# Patient Record
Sex: Male | Born: 1935 | Race: White | Hispanic: No | Marital: Single | Smoking: Former smoker
Health system: Southern US, Community
[De-identification: ages and names within clinical notes are randomized; demographics above are authoritative.]

## PROBLEM LIST (undated history)

## (undated) DIAGNOSIS — K7031 Alcoholic cirrhosis of liver with ascites: Secondary | ICD-10-CM

## (undated) DIAGNOSIS — D62 Acute posthemorrhagic anemia: Secondary | ICD-10-CM

## (undated) DIAGNOSIS — E785 Hyperlipidemia, unspecified: Secondary | ICD-10-CM

## (undated) DIAGNOSIS — I4891 Unspecified atrial fibrillation: Secondary | ICD-10-CM

## (undated) DIAGNOSIS — I8510 Secondary esophageal varices without bleeding: Secondary | ICD-10-CM

## (undated) DIAGNOSIS — C61 Malignant neoplasm of prostate: Secondary | ICD-10-CM

## (undated) DIAGNOSIS — E039 Hypothyroidism, unspecified: Secondary | ICD-10-CM

## (undated) DIAGNOSIS — J189 Pneumonia, unspecified organism: Secondary | ICD-10-CM

## (undated) DIAGNOSIS — F32A Depression, unspecified: Secondary | ICD-10-CM

## (undated) DIAGNOSIS — R350 Frequency of micturition: Secondary | ICD-10-CM

## (undated) DIAGNOSIS — K7030 Alcoholic cirrhosis of liver without ascites: Secondary | ICD-10-CM

## (undated) DIAGNOSIS — I509 Heart failure, unspecified: Secondary | ICD-10-CM

## (undated) DIAGNOSIS — S72142A Displaced intertrochanteric fracture of left femur, initial encounter for closed fracture: Secondary | ICD-10-CM

## (undated) DIAGNOSIS — F419 Anxiety disorder, unspecified: Secondary | ICD-10-CM

## (undated) DIAGNOSIS — K5909 Other constipation: Secondary | ICD-10-CM

## (undated) DIAGNOSIS — N401 Enlarged prostate with lower urinary tract symptoms: Secondary | ICD-10-CM

## (undated) DIAGNOSIS — N138 Other obstructive and reflux uropathy: Secondary | ICD-10-CM

## (undated) DIAGNOSIS — I209 Angina pectoris, unspecified: Secondary | ICD-10-CM

## (undated) DIAGNOSIS — F329 Major depressive disorder, single episode, unspecified: Secondary | ICD-10-CM

## (undated) DIAGNOSIS — I10 Essential (primary) hypertension: Secondary | ICD-10-CM

## (undated) DIAGNOSIS — K219 Gastro-esophageal reflux disease without esophagitis: Secondary | ICD-10-CM

## (undated) DIAGNOSIS — Z87898 Personal history of other specified conditions: Secondary | ICD-10-CM

## (undated) DIAGNOSIS — M1990 Unspecified osteoarthritis, unspecified site: Secondary | ICD-10-CM

## (undated) DIAGNOSIS — H9193 Unspecified hearing loss, bilateral: Secondary | ICD-10-CM

## (undated) DIAGNOSIS — M4854XA Collapsed vertebra, not elsewhere classified, thoracic region, initial encounter for fracture: Secondary | ICD-10-CM

## (undated) HISTORY — PX: JOINT REPLACEMENT: SHX530

## (undated) HISTORY — DX: Other obstructive and reflux uropathy: N13.8

## (undated) HISTORY — DX: Essential (primary) hypertension: I10

## (undated) HISTORY — DX: Unspecified osteoarthritis, unspecified site: M19.90

## (undated) HISTORY — DX: Collapsed vertebra, not elsewhere classified, thoracic region, initial encounter for fracture: M48.54XA

## (undated) HISTORY — DX: Acute posthemorrhagic anemia: D62

## (undated) HISTORY — DX: Hyperlipidemia, unspecified: E78.5

## (undated) HISTORY — DX: Other obstructive and reflux uropathy: N40.1

## (undated) HISTORY — DX: Alcoholic cirrhosis of liver without ascites: K70.30

## (undated) HISTORY — DX: Pneumonia, unspecified organism: J18.9

## (undated) HISTORY — DX: Secondary esophageal varices without bleeding: I85.10

## (undated) HISTORY — DX: Alcoholic cirrhosis of liver with ascites: K70.31

---

## 1988-04-24 HISTORY — PX: INGUINAL HERNIA REPAIR: SUR1180

## 1990-04-24 DIAGNOSIS — C61 Malignant neoplasm of prostate: Secondary | ICD-10-CM

## 1990-04-24 HISTORY — DX: Malignant neoplasm of prostate: C61

## 1990-04-24 HISTORY — PX: TRANSURETHRAL RESECTION OF PROSTATE: SHX73

## 1992-04-24 HISTORY — PX: TOTAL KNEE ARTHROPLASTY: SHX125

## 1995-04-25 HISTORY — PX: TOTAL SHOULDER REPLACEMENT: SUR1217

## 1997-10-06 ENCOUNTER — Ambulatory Visit (HOSPITAL_COMMUNITY): Admission: RE | Admit: 1997-10-06 | Discharge: 1997-10-06 | Payer: Self-pay | Admitting: Orthopedic Surgery

## 1997-10-20 ENCOUNTER — Encounter: Admission: RE | Admit: 1997-10-20 | Discharge: 1998-01-18 | Payer: Self-pay | Admitting: Anesthesiology

## 2001-07-29 ENCOUNTER — Encounter: Payer: Self-pay | Admitting: Orthopedic Surgery

## 2001-08-02 ENCOUNTER — Inpatient Hospital Stay (HOSPITAL_COMMUNITY): Admission: RE | Admit: 2001-08-02 | Discharge: 2001-08-05 | Payer: Self-pay | Admitting: Orthopedic Surgery

## 2001-08-02 ENCOUNTER — Encounter: Payer: Self-pay | Admitting: Orthopedic Surgery

## 2002-04-24 HISTORY — PX: CHOLECYSTECTOMY: SHX55

## 2002-10-06 ENCOUNTER — Inpatient Hospital Stay (HOSPITAL_COMMUNITY): Admission: EM | Admit: 2002-10-06 | Discharge: 2002-10-10 | Payer: Self-pay | Admitting: Emergency Medicine

## 2002-10-06 ENCOUNTER — Encounter: Payer: Self-pay | Admitting: Cardiology

## 2003-01-26 ENCOUNTER — Encounter: Payer: Self-pay | Admitting: Cardiology

## 2003-01-26 ENCOUNTER — Ambulatory Visit (HOSPITAL_COMMUNITY): Admission: RE | Admit: 2003-01-26 | Discharge: 2003-01-26 | Payer: Self-pay | Admitting: Cardiology

## 2003-04-21 ENCOUNTER — Inpatient Hospital Stay (HOSPITAL_COMMUNITY): Admission: EM | Admit: 2003-04-21 | Discharge: 2003-04-30 | Payer: Self-pay | Admitting: Emergency Medicine

## 2003-04-27 ENCOUNTER — Encounter (INDEPENDENT_AMBULATORY_CARE_PROVIDER_SITE_OTHER): Payer: Self-pay | Admitting: Specialist

## 2004-07-11 ENCOUNTER — Encounter: Admission: RE | Admit: 2004-07-11 | Discharge: 2004-07-11 | Payer: Self-pay | Admitting: Orthopedic Surgery

## 2004-09-08 ENCOUNTER — Encounter: Admission: RE | Admit: 2004-09-08 | Discharge: 2004-09-08 | Payer: Self-pay | Admitting: Cardiology

## 2004-12-27 ENCOUNTER — Encounter: Admission: RE | Admit: 2004-12-27 | Discharge: 2004-12-27 | Payer: Self-pay | Admitting: General Surgery

## 2005-11-16 ENCOUNTER — Encounter: Admission: RE | Admit: 2005-11-16 | Discharge: 2005-11-16 | Payer: Self-pay | Admitting: Cardiology

## 2005-11-22 ENCOUNTER — Encounter: Payer: Self-pay | Admitting: Vascular Surgery

## 2005-11-22 ENCOUNTER — Ambulatory Visit (HOSPITAL_COMMUNITY): Admission: RE | Admit: 2005-11-22 | Discharge: 2005-11-22 | Payer: Self-pay | Admitting: Family Medicine

## 2007-03-05 ENCOUNTER — Encounter: Admission: RE | Admit: 2007-03-05 | Discharge: 2007-03-05 | Payer: Self-pay | Admitting: Family Medicine

## 2007-11-28 ENCOUNTER — Encounter: Admission: RE | Admit: 2007-11-28 | Discharge: 2007-11-28 | Payer: Self-pay | Admitting: Cardiology

## 2008-12-16 ENCOUNTER — Encounter: Admission: RE | Admit: 2008-12-16 | Discharge: 2008-12-16 | Payer: Self-pay | Admitting: Cardiology

## 2009-10-15 ENCOUNTER — Encounter: Admission: RE | Admit: 2009-10-15 | Discharge: 2009-10-15 | Payer: Self-pay | Admitting: Family Medicine

## 2010-03-15 ENCOUNTER — Encounter: Admission: RE | Admit: 2010-03-15 | Discharge: 2010-03-15 | Payer: Self-pay | Admitting: Cardiology

## 2010-04-09 ENCOUNTER — Ambulatory Visit (HOSPITAL_COMMUNITY)
Admission: RE | Admit: 2010-04-09 | Discharge: 2010-04-09 | Payer: Self-pay | Source: Home / Self Care | Attending: Orthopedic Surgery | Admitting: Orthopedic Surgery

## 2010-09-09 NOTE — Consult Note (Signed)
NAME:  Raymond Larsen, PUCCINELLI NO.:  [id number]   MEDICAL RECORD NO.:  [id number]                   PATIENT TYPE:  INP   LOCATION:  2015                                 FACILITY:  [facility]   PHYSICIAN:  Georgiana Spinner, M.D.                 DATE OF BIRTH:  [birth year]   DATE OF CONSULTATION:  04/22/2003  DATE OF DISCHARGE:                                   CONSULTATION   HISTORY OF PRESENT ILLNESS:  Mr. Hippe is a 77 year old gentleman who I have  been asked to see because of weight loss and dysphagia and anorexia.  The  patient states that over the last three months he has lost approximately 38  pounds.  He had a history of GERD, on Nexium therapy.  He has been followed  at the [place].  He was seen there, and apparently gave a history of dysphagia  mostly to solid foods, but some for liquids.  He describes the fact that he  will chew and eat and it seems to stop, and then with time the food will  eventually go down.  He was seen at the [facility] in [place], [place], and  apparently a barium swallow was done.  I called the number there, however,  the number given was in error.  At any rate, his past medical history is  notable for atrial fibrillation, and he was cardioverted in October 2004.  He had a history of congestive heart failure.  Catheterization in June 2004,  showed an ejection fraction of 40 to 45%, some coronary calcifications, and  some coronary artery disease, but too small for intervention mechanically.  Therefore, medical intervention was recommended.  He had again a mildly  dilated left ventricle on ultrasound.  He has had a history of hypertension  and pulmonary hypertension.  He also has a history of osteoarthritis and  gout.  Hypothyroidism, anxiety, depression, hyperlipidemia, and a history of  prostatitis with BPH.   PAST SURGICAL HISTORY:  As noted in the chart.  He has had no abdominal  surgeries, though inguinal hernia repair many, many years ago.   ALLERGIES:  SULFA.   MEDICATIONS:  He is taking Nexium apparently for reflux.   FAMILY HISTORY:  Noncontributory.   SOCIAL HISTORY:  He has had a heavy use of alcohol in the past, but stopped  about in 1987.  He chews tobacco, but does not smoke.   ADMISSION MEDICATIONS:  1. Niacin.  2. Coumadin.  3. Digoxin.  4. Lasix.  5. Remeron.  6. Ultram.  7. Lisinopril.  8. ________.  9. Nexium.  10.      Simvastatin.  11.      Trazodone.  12.      Gemfibrozil.  13.      Allopurinol.  14.      Levofloxacin.  15.      Hytrin.  16.      Amiodarone.   PHYSICAL EXAMINATION:  VITAL SIGNS:  On admission, his temperature was 97,  pulse 92, respiratory rate 18, blood pressure approximately 100 systolic.  His O2 saturation was 98%.  GENERAL:  He is a thin male, appears in no distress.  He is alert and  oriented and cooperative.  HEENT:  Without jaundice.  NECK:  There were no bruits in the neck.  LUNGS:  Clear.  HEART:  Regular rhythm at this time when I checked him with a short systolic  murmur heard over the left systolic border.  ABDOMEN:  Benign, nontender.  RECTAL:  Deferred.  The remainder of the examination was grossly normal.   LABORATORY DATA:  CMET shows a BUN of 34, creatinine of 3.1, indicating some  degree of mild renal insufficiency.  The albumin is 4.3, and I wonder  whether he may be slightly dehydrated.  His CBC shows hematocrit of 37.2,  white count of 8400, platelet count 173,000.  TSH was normal.  Vitamin B12  and folate were normal.  Amylase and lipase were in the normal range.  Prothrombin time 16.9 seconds, PTT was 41 seconds.  Radiologic studies show  no active chest disease.  No evidence of bowel obstruction.   IMPRESSION AND RECOMMENDATIONS:  Given the patient's significant weight loss  with dysphagia and reflux, certainly an adenocarcinoma of the esophagus  should be a consideration.  Also given the fact that he has had heavy  alcohol use in the past and  has been a tobacco user, certainly a squamous  cell carcinoma of the esophagus or even a higher lesion such as pharyngeal  tumor must be considered.  We at this point suggest that endoscopy.  We  explained the procedure, the risks, benefits, rationales to the patient and  he appears to be alert and cooperative, and wishes to proceed.                                               Georgiana Spinner, M.D.    GMO/MEDQ  D:  04/22/2003  T:  04/22/2003  Job:  [id number]   cc:   Gloriajean Dell. Andrey Campanile, M.D.  [address]  [place]  [place] [address]  Fax: [phone]

## 2010-09-09 NOTE — H&P (Signed)
NAME:  Raymond Larsen, WINT NO.:  [id number]   MEDICAL RECORD NO.:  [id number]                   PATIENT TYPE:  INP   LOCATION:  1825                                 FACILITY:  [facility]   PHYSICIAN:  Rosanne Sack, M.D.         DATE OF BIRTH:  23-Jul-1922   DATE OF ADMISSION:  DATE OF DISCHARGE:                                HISTORY & PHYSICAL   CHIEF COMPLAINT:  Ongoing nausea, anorexia, weight loss.   PROBLEM LIST:  1. Signs and symptoms complex including nausea, anorexia, weight loss.  The     patient does state that he has lost approximately 38 pounds over the last     3 months.     A. Vomited 1 time over the past 24 hours.     B. History listed of GERD on routine Nexium.  2. Atrial fibrillation, unclear duration, hospitalized Raymond Larsen June 14-     June 18,2004.     A. Cardioversion successful January 26, 2003.     B. Listed previous history of congestive heart failure.     C. Cardiac catheterization October 08, 2002.  Noted left ventricular        systolic function mildly decreased with ejection fraction 40-45%.        Some coronary calcifications.  Circumflex intermediate branch 60%        stenosis, 90% mid-stenosis, too small for intervention. Recommended        medical management.     D. A 2-D echocardiogram October 07, 2002 noted mild dilated left ventricle        with left ventricular systolic function mildly to moderately reduced.        Ejection fraction 40-45%.  Left atrium was mildly dilated.     E. Hypertensive heart disease.     F. Pulmonary hypertension.  3. Hypertension, as above.  4. Severe osteoarthritis and listed history of gout.     A. Status post right shoulder arthroplasty August 02, 2001 with Dr.        Simonne Come.  5. Hypothyroidism.  6. Anxiety and depression.  7. Hyperlipidemia.  8. History of acute prostatitis.  Listed history of BPH status post TURP     1970s.   PAST SURGICAL HISTORY:  In 1989, right rotator  arthroplasty.  In 1994, right  total knee replacement.  TURP 1970's.  Left inguinal hernia repair x2  1980's. ORIF right shoulder Dr. Simonne Come April 2003.   ALLERGIES:  SULFA.   CODE STATUS:  Clearly a full code.   HISTORY OF PRESENT ILLNESS:  This 77 year old male is followed as an  outpatient by Dr. Andrey Campanile at [facility].  He describes a 30  days' history of nausea, anorexia, weight loss.  In fact, the weight loss  extends back approximately 3 months and the patient has lost approximately  38 pounds.  Describes what sounds like esophageal stricture, describing  solids becoming  stuck.  The patient has been subsisting on liquids such as  Ensure.  He did vomit x1 today. He was seen recently at the [facility]  Administration. They apparently did a barium swallow and lab work at that  time, though I do not have results.  He was seen at [facility] today and vomited while at the office, was sent onto [facility]  Emergency Room for further evaluation and treatment.   FAMILY HISTORY:  Noncontributory in this 77 year old male.   SOCIAL HISTORY:  Reported heavy alcohol use though quit 1987.  Nonsmoker,  though does chew tobacco.  He is retired.   REVIEW OF SYSTEMS:  GENERAL:  Denies fever but significant weight loss as  described above.  HEENT:  Denies any changes in his visual acuity or hearing  acuity. Does describe some type of dysphagia with the sensation of solids  becoming stuck in the esophagus.  Has been subsisting on liquids such as  Ensure.  HEART:  Denies chest pain or palpitations. Does have a significant  history of atrial fibrillation and coronary artery disease as described  above.  LUNGS:  Denies any shortness of breath or cough. Does describe some  night sweats, however.  ABDOMEN:  Vomiting x1 today.  Ongoing nausea for at  least 30 days. Denies any obvious blood or tarry stools, denies any coffee-  grounds emesis.  MUSCULOSKELETAL:   Describes progressive weakness.  Various  mild arthralgias.  Does have a significant arthritic history and gout  listed.  NEUROLOGIC:  Denies any syncope or vertigo, denies history of  stroke or seizure.  INTEGUMENTARY:  Denies any open ulcers or suspicious  lesions.   MEDICATIONS ON ADMISSION:  1. Niacin 1000 mg daily.  2. Warfarin (Coumadin) 5 mg take 1/2 tablet equally 2.5 mg daily at bedtime.  3. Digoxin/Lanoxin 0.25 mg daily.  4. Lasix 40 mg  daily.  5. Remeron 15 mg, take 1/2 tablet equaling 7.5 mg daily at bedtime.  6. Ultram 5/500 mg 4 times daily as needed.  7. Lisinopril 20 mg take 1-1/2 tablet equaling 30 mg daily.  8. Inspra 25 mg daily.  9. Nexium 40 mg daily.  10.      Simvastatin 80 mg take 1/2 tablet equaling 40 mg at bedtime.  11.      Trazodone 50/500 mg daily at bedtime.  12.      Gemfibrozil 600 mg daily at bedtime.  13.      Allopurinol 300 mg daily.  14.      Levothyroxine 0.15 mg daily.  15.      Hytrin 10 mg daily at bedtime.  16.      Amiodarone 200 mg take 1/2 tablet equaling 100 mg daily.   LABORATORY DATA:  Admission labs are all pending, as per admission orders.  Radiology the same.   PHYSICAL EXAMINATION:  VITAL SIGNS:  Temperature 97.0, pulse 92,  respirations 18, blood pressure ranging 80-101/54, O2 saturation 98%.  GENERAL:  A well-developed though somewhat thin elderly male in no acute  distress. Alert and oriented, cooperative with exam.  HEENT:  Normocephalic head.  Eyes:  Pupils equal and reactive. Ears:  TMs  clear, hearing grossly normal.  Nose:  Patent without masses or discharge  noted. Oral mucosa pink and moist.  NECK:  No jugulovenous distention or bruits noted.  No lymphadenopathy.  LUNGS:  Breath sounds clear and equal without distress or cough.  CHEST:  No adenopathy noted.  CARDIOVASCULAR:  Regular  rate and rhythm with a 1/6 systolic ejection murmur  heard best over the left sternal border. ABDOMEN:  Soft and round, positive bowel  sounds.  Mildly and diffusely  tender with palpation.  RECTAL:  Deferred.  UROGENITAL:  No bladder pain or CVA tenderness.  Genitalia exam deferred as  noncontributory.  MUSCULOSKELETAL: Range of motion is full.  Strength mildly decreased but  equal.  NEUROLOGIC:  Cranial nerves II-XII grossly intact. No obvious unilateral or  focal defects.  INTEGUMENTARY:  Warm and dry.  Some skin tenting suggestive of a degree of  dehydration.  No obvious ulcers or suspicious lesions.   IMPRESSION/PLAN:  1. Signs and symptoms complex including nausea, anorexia, weight loss,     vomiting x1 today.  He has had a work-up recently at the [facility]     Administration clinic including blood work and some type of barium     swallow, though we do not have these results currently.  Abdominal exam     essentially nonfocal except for some slight tenderness. No indication of     hematemesis or tarry, or frank bloody stool.  Would make the patient     n.p.o. on admission except for sips of water and any oral medications.  Will have to repeat all lab work including comprehensive metabolic panel,  amylase, lipase.  Will proceed with plain film radiology at this point  including chest x-ray and abdominal x-rays, and defer further radiology  recommendations to GI consult which will be requested. Will hold all  possible offending medicines such as Niacin, statin, gemfibrozil,  allopurinol and possibly amiodarone.  Would continue proton pump inhibitor.  Would check a urinalysis with culture and sensitivity, though the patient  does not appear positive for urinary tract infection per clinical exam.  1. Uncontrolled hypertension with mild hypotension currently. Suspect a     degree of dehydration. Would hold lisinopril at this point until we are     able to follow his renal function and electrolytes.  Would also hold     Lasix and empirically hydrate following 4 comprehensive metabolic panel     values.  2.  Dehydration and acute renal insufficiency clinically.  Admission labs are     pending. Would hydrate, dependent on lab values and fluid rate, depending     on lab values. Again, would hold ACE inhibitor and Lasix.  3. Hypothyroidism. Patient is on replacement. Would check a TSH.  4. Atrial fibrillation and history of coronary artery disease.  Previously     determined medical management.  No complaints of chest pain but would     follow a 12-lead electrocardiogram and 1 set of cardiac enzymes.     Telemetry bed to follow initially.  5. Coumadin anticoagulation, secondary to his history of atrial     fibrillation.  Would continue Coumadin with pharmacy to follow dosing.     Will need to check a PT/INR.   DISPOSITION:  Telemetry bed service, Rosanne Sack, M.D., [phone].   DIET:  N.P.O. except sips of H20 and medications.   ACTIVITY:  Up to chair and bathroom as tolerated.   CONDITION:  Guarded.     Everett Graff, N.P.                     Rosanne Sack, M.D.    TC/MEDQ  D:  04/21/2003  T:  04/21/2003  Job:  [id number]

## 2010-09-09 NOTE — Op Note (Signed)
NAME:  Raymond Larsen, Raymond Larsen NO.:  [id number]   MEDICAL RECORD NO.:  [id number]                   PATIENT TYPE:  INP   LOCATION:  2015                                 FACILITY:  [facility]   PHYSICIAN:  Georgiana Spinner, M.D.                 DATE OF BIRTH:  Nov 08, 1922   DATE OF PROCEDURE:  04/22/2003  DATE OF DISCHARGE:                                 OPERATIVE REPORT   PROCEDURE:  Endoscopy.   ANESTHESIA:  Demerol 40 mg and Versed 5 mg.   ENDOSCOPIST:  Georgiana Spinner, M.D.   INDICATIONS FOR PROCEDURE:  Significant dysphagia.   DESCRIPTION OF PROCEDURE:  With the patient mildly sedated in the left  lateral decubitus position, the Olympus videoscopic endoscope was inserted  in the mouth and passed under direction vision through the esophagus which  appeared grossly normal.  We entered into the stomach and the fundus, body,  antrum, duodenal bulb and second portion of the duodenal all appeared  normal.  From this point the endoscope was slowly withdrawn taking several  different views of the duodenal mucosa.  The endoscope was then pulled back  into the stomach and placed in retroflexion to view the stomach from below  and a loose wrap of the GE junction around the endoscope was noted.  The  endoscope was then straightened and withdrawn taking several different views  of the remaining gastric and esophageal mucosa.  The patient's vital signs  and pulse oximeter remained stable.  The patient tolerated the procedure  well without apparent complications.   FINDINGS:  Unremarkable endoscopic examination.   PLAN:  We will get a modified barium swallow to evaluate the upper  esophageal sphincter dysfunction and possibly upper esophageal dysphagia  along with a CT scan of the abdomen and pelvis for continued prolonged  weight loss.                                               Georgiana Spinner, M.D.    GMO/MEDQ  D:  04/22/2003  T:  04/22/2003  Job:  [id number]   cc:    Gloriajean Dell. Andrey Campanile, M.D.  [address]  [place]  [place] [address]  Fax: [phone]

## 2010-09-09 NOTE — Discharge Summary (Signed)
NAME:  Raymond Larsen, Raymond Larsen NO.:  [id number]   MEDICAL RECORD NO.:  [id number]                   PATIENT TYPE:  INP   LOCATION:  2015                                 FACILITY:  [facility]   PHYSICIAN:  Lonia Blood, M.D.            DATE OF BIRTH:  Oct 14, 1922   DATE OF ADMISSION:  04/21/2003  DATE OF DISCHARGE:  04/30/2003                                 DISCHARGE SUMMARY   DISCHARGE DIAGNOSES:  1. Subacute/chronic cholecystitis.     a. Status post laparoscopic cholecystectomy.     b. Resolution of symptoms to include nausea, anorexia and weight loss.  2. Long history of atrial fibrillation.     a. Successful direct current cardioversion, January 26, 2003.     b. Followed by Georga Hacking, M.D.     c. Chronic Coumadin therapy.     d. Chronic amiodarone therapy.  3. Mild systolic congestive heart failure with ejection fraction 40-45% on     cardiac catheterization, June 2004.     a. Circumflex intermediate branch, 60% stenosis.     b. 90% midstenosis, circumflex intermediate branch.  4. Hypertension.  5. Mild pulmonary hypertension.  6. Severe osteoarthritis and possible history of gout-currently well-     controlled off allopurinol.  7. Status post right shoulder arthroplasty, April 2003-Cliff Aplington, M.D.  8. Hypothyroidism-Synthroid increased this hospitalization.  9. History of anxiety and depression.  10.      Hyperlipidemia.  11.      Remote history of acute prostatitis.  12.      Benign prostatic hypertrophy, status post transurethral resection     of prostate in 1970.  13.      Left inguinal hernia repair x2 in the 80's.  14.      Orthopedic surgery to the right rotator cuff, right total knee and     right shoulder.   ALLERGIES:  SULFA.   DISCHARGE MEDICATIONS:  An attempt has been made to simply the patient's  medical regimen.  1. Hytrin 10 mg q.h.s.  2. Remeron 15 mg-1/2 tablet q.h.s.  3. Nexium 40 mg daily.  4. Synthroid 175 mcg  daily.  5. Lopressor 50 mg b.i.d.  6. Coumadin 2.5 mg q.h.s.  7. Amiodarone 100 mg daily.  8. Niacin 1000 mg daily.  9. Zocor 40 mg each evening.  10.      Gemfibrozil 600 mg each evening.  11.      Percocet p.r.n. pain.   FOLLOWUP:  1. The patient will followup with Abigail Miyamoto, M.D. in two weeks.  2. Patient will present to Dr. York Spaniel office on Monday to have his INR     checked-patient informed of disposition if this is his usual protocol.  3. Patient will followup at [facility] on an as needed     basis for ongoing medical care.   CONSULTATIONS:  1. Georgiana Spinner,  M.D.-Gastroenterology.  2. Abigail Miyamoto, M.D.-General Surgery.   PROCEDURES:  1. Laparoscopic cholecystectomy, April 27, 2003.  2. EGD April 22, 2003-negative examination.  3. Modified barium swallow-unremarkable.  4. Colonoscopy, April 24, 2003-unremarkable.  5. CT scan of the abdomen and pelvis, April 22, 2003-several renal     hypodense lesions on the right which are nonspecific.  No     nephrolithiasis.  Slight nodular contours of the liver.  Thick wall     gallbladder.  Right inguinal nodularity potentially due to small inguinal     lymph nodes or small hydrocele.  6. Ultrasound of the abdomen, April 24, 2003-thickened gallbladder wall     with tenderness over the gallbladder.   HISTORY OF PRESENT ILLNESS:  Raymond Larsen is a very pleasant 77 year old  gentleman, who is followed at [facility] by Gloriajean Dell. Andrey Campanile,  M.D.  He presented to the hospital with a 30 day history of nausea, anorexia  and weight loss.  Weight loss in fact had extended about three months and  the patient reported that over this time he had lost a total of about 38  pounds.  The patient had complaints of solids being stuck in his throat.  He  had been living primarily on liquids.  He was also plagued with significant  nausea and vomiting.  Because the patient's symptoms were severe  and because  the patient's wife was alarmed by his weight loss, he presented to the  emergency room of [facility] for evaluation.   HOSPITAL COURSE:  #1.  Chronic cholecystitis-patient was admitted to the  hospital with complaints of nausea, vomiting and significant weight loss  over a three month period.  The initial concern was for possible esophageal  stricture versus occult cancer, specifically of the gastrointestinal tract.  Georgiana Spinner, M.D. of Gastroenterology was consulted.  Esophagogastroduodenoscopy was accomplished and was unremarkable.  Barium  swallow was accomplished and revealed no esophageal stricture.  Colonoscopy  was obtained and was also unremarkable.  As a result, CT scan of the abdomen  and pelvis was obtained.  This raised the question of possible  cholecystitis.  Ultrasound of the abdomen was obtained and further suggested  cholecystitis.  Abigail Miyamoto, M.D. with General Surgery, was therefore,  consulted.  He agreed that the patient's sign, symptom complex and  radiographic findings/physical examination were all consistent with  cholecystitis.  The patient was taken to the operating room on the above  listed day and underwent a laparoscopic cholecystectomy.  This was carried  off without significant difficulty.  The patient tolerated the procedure  well.  In the postoperative period, he was able to rapidly titrate back to a  regular diet.  Nausea and vomiting had resolved.  There were no more  complaints of abdominal pain and the patient's appetite improved  significantly.  At the time of discharge, the patient's wounds are stable.  They are dressed and dry.  Abdomen is unremarkable and nondistended with  positive bowel sounds.  The patient is tolerating a full regular diet.  The  patient will followup with Dr. Magnus Ivan as detailed above.   #2.  Normocytic anemia-during this hospitalization, the patient noted to have a normocytic anemia.  He has had a  complete workup to include both an  esophagogastroduodenoscopy and colonoscopy, both of which were negative.  This anemia is likely an anemia of chronic disease, secondary to chronic  inflammation with his chronic cholecystitis.  I would simply recommend that  a CBC be obtained in approximately two to three weeks to assure that his  hemoglobin has normalized.   #3.  Hypothyroidism-the patient has a significant history of hypothyroidism.  TSH was obtained during his hospitalization and was markedly elevated at  4.68.  As a result, his Synthroid was increased to 175 mcg p.o. daily.  Repeat of TSH should be accomplished in six to eight weeks.   #4.  Atrial fibrillation.  The patient has a longstanding history of atrial  fibrillation.  He has undergone direct current cardioversion and actually  remained in normal sinus rhythm throughout this entire hospitalization.  He  has previously been treated with amiodarone and also Coumadin for his atrial  fibrillation.  Digoxin has also been used.  Digoxin was discontinued all  together during this hospitalization.  Amiodarone was held initially as it  was thought to be contributing to the patient's gastrointestinal symptom  complex.  After the patient's cholecystectomy, his amiodarone was able to be  resumed.  Coumadin was held in anticipation of his general surgery and the  patient was dosed with Lovenox.  At the time of discharge, the patient's  Coumadin has been resumed.  INR is 1.4 at the time of discharge following a  5 mg loading dose and then resumption of 2.5 mg q.h.s.  It is not felt that  the daily risk of stroke is significant enough to keep the patient in the  hospital awaiting a therapeutic INR, because the patient is in fact in sinus  rhythm at this time and has been throughout this hospitalization.  The  patient will followup with Dr. Donnie Aho as is his routine for a recheck and  monitoring of his INR.   #5.  Polypharmacy-at the time  of admission, the patient complained that he  felt that he was on too many medications.  A concerted effort was made to  simplify the patient's medical regimen during this hospitalization.  It  should be noted that he is being discharged on considerably fewer  medications than he was taking when he was admitted.  It is possible,  however, that some of his previous medications will need to be resumed if  his symptoms justify this.                                                Lonia Blood, M.D.    JTM/MEDQ  D:  04/30/2003  T:  04/30/2003  Job:  [id number]   cc:   Darden Palmer., M.D.  [address], [address]  [place]  [place] [address]  Fax: [phone]   Gloriajean Dell. Andrey Campanile, M.D.  [address]  [place]  [place] [address]  Fax: [phone]   Abigail Miyamoto, M.D.  [address]  [place]  [place] [address]  Fax: [phone]

## 2010-09-09 NOTE — Consult Note (Signed)
NAME:  Raymond Larsen, Raymond Larsen NO.:  [id number]   MEDICAL RECORD NO.:  [id number]                   PATIENT TYPE:  INP   LOCATION:  2015                                 FACILITY:  [facility]   PHYSICIAN:  Abigail Miyamoto, M.D.              DATE OF BIRTH:  1922/09/06   DATE OF CONSULTATION:  04/27/2003  DATE OF DISCHARGE:                                   CONSULTATION   CONSULTING PHYSICIAN:  Abigail Miyamoto, M.D.   REFERRING PHYSICIAN:  Lonia Blood, M.D.   CHIEF COMPLAINT:  Nausea.   HISTORY OF PRESENT ILLNESS:  This is a 77 year old gentleman who was  admitted on April 13, 2003, with a history of ongoing nausea, anorexia,  and weight loss.  The patient reports that he has had weight loss for  approximately three months and had been nauseated with just about everything  he eats.  He also describes some dysphagia.  He does report that he has had  some pain in his right upper quadrant and pain due to his back.  He has also  had occasional emesis.  He denies any jaundice.  He has been able to take  Ensure at home but has not been able to eat much of anything else.  He has  had no dysuria, no constipation.  He has had no hematemesis or no melena.   PAST MEDICAL HISTORY:  1. Atrial fibrillation.  2. Hypertension.  3. Osteoarthritis.  4. Hypothyroidism.  5. Anxiety and depression.  6. Hyperlipidemia.  7. Prostatitis.  8. History of GERD.  9. He has significant cardiac history and his last ejection fraction was 40     to 45%.  10.      He also had some pulmonary hypertension.   PAST SURGICAL HISTORY:  1. Rotator cuff arthroplasty.  2. Right knee replacement surgery.  3. TURP.  4. Inguinal hernia surgery x2.  5. ORIF of the right shoulder.   ALLERGIES:  Allergic to SULFA.   CURRENT MEDICATIONS:  Niacin, Coumadin, digoxin, Lasix, Remeron, Ultram,  Lisinopril, Inspra, Nexium, Simvastatin, trazodone,  gemfibrozil,  allopurinol, levothyroxine,  Hytrin, amiodarone.   SOCIAL HISTORY:  He use to use alcohol heavily but stopped in 1987.  He is a  nonsmoker.   PHYSICAL EXAMINATION:  GENERAL APPEARANCE:  A well-developed, well-nourished  gentleman.  He does not appear to be in any acute distress currently.  He is  well in appearance.  VITAL SIGNS:  Temperature 97.4, blood pressure 144/67, pulse 55, respiratory  rate 20, saturating 97% on room air.  HEENT:  Eyes:  He is anicteric.  Pupils reactive bilaterally.  External ears  and nose are normal.  Hearing is normal.  Oropharynx is clear.  NECK:  Supple.  There is no cervical adenopathy.  There is no thyromegaly.  LUNGS:  Clear to auscultation bilaterally with normal respiratory effort.  CARDIOVASCULAR:  Regular rate  and rhythm.  There is no peripheral edema.  ABDOMEN:  Soft.  There is tenderness to deep palpation in the left quadrant  with some guarding.  There are no masses.  There are no hernias, no  organomegaly.  EXTREMITIES:  Warm and well perfused.   LABORATORY DATA:  The patient had an ultrasound of the abdomen which showed  him to have a thickened gallbladder wall, contracted gallbladder without  stones.  Common bile duct normal at 4.2.  CAT scan of the abdomen and pelvis  remarkable only for contracted gallbladder and questionable cirrhotic liver.  He has had a recent upper and lower endoscopy which is negative for any  acute problem.   He has a PT of 16 and INR of 1.4.  White blood cell count 5.7.  Liver  function tests are normal with bilirubin 0.7, alkaline phosphatase 56.  Amylase and lipase are normal.  BUN and creatinine are 5 and 1.0.  Potassium  4.0.   IMPRESSION:  This is a 77 year old gentleman with abdominal pain of  uncertain etiology.  I do suspect chronic cholecystitis based on his  symptoms, physical examination and the ultrasound and CAT scan.  He again  did not appear to have any underlying malignancy based on his endoscopy and  scans.  At this point,  I have discussed this with him and his wife.  Options  are to proceed with cholecystectomy versus changing to a HIDA scan.  Even if  the HIDA scan was negative, also consider diagnostic laparoscopy and  cholecystectomy so at this point, after discussion with the patient and his  wife, we will proceed with laparoscopic cholecystectomy.  I have discussed  this with them in detail including the risks of surgery which included  bleeding, infection, common bile duct injury, need for open surgery, etc.  At this point, they wish to proceed.  Surgery will thus be scheduled for as  soon as possible.                                               Abigail Miyamoto, M.D.    DB/MEDQ  D:  04/27/2003  T:  04/27/2003  Job:  [id number]   cc:   Lonia Blood, M.D.  [address]  [place], [place] [address]  Fax: [phone]

## 2010-09-09 NOTE — Op Note (Signed)
NAME:  Raymond Larsen NO.:  [id number]   MEDICAL RECORD NO.:  [id number]                   PATIENT TYPE:  INP   LOCATION:  2015                                 FACILITY:  [facility]   PHYSICIAN:  Abigail Miyamoto, M.D.              DATE OF BIRTH:  August 12, 1922   DATE OF PROCEDURE:  04/27/2003  DATE OF DISCHARGE:                                 OPERATIVE REPORT   PREOPERATIVE DIAGNOSIS:  Chronic cholecystitis.   POSTOPERATIVE DIAGNOSIS:  Chronic cholecystitis, mild cirrhosis.   PROCEDURE:  Laparoscopic cholecystectomy.   SURGEON:  Abigail Miyamoto, M.D.   ASSISTANT:  Ollen Gross. Carolynne Edouard, M.D.   ANESTHESIA:  General endotracheal.   ESTIMATED BLOOD LOSS:  Minimal.   FINDINGS:  The patient was found to have a chronically scarred-appearing  gallbladder with omentum stuck to it consistent with cholecystitis.  The  liver was mildly cirrhotic and mild venous ooze came from the liver  necessitating the use of Surgicel.   DESCRIPTION OF PROCEDURE:  The patient was brought to the operating room and  identified as Raymond Larsen.  He was placed supine on the operating table and  general anesthesia was induced.  His abdomen was then prepped and draped in  the usual sterile fashion.  Using a #15 blade, a small transverse incision  was made below the umbilicus.  The incision was carried down to the fascia  which was then opened with a scalpel.  Hemostat was then used to pass  through the peritoneal cavity.  Next a 0 Vicryl pursestring suture was  placed around the fascial opening.  The Hasson port was placed through the  opening and insufflation of the abdomen was begun.  A 12 mm port was then  placed in the patient's epigastrium and two 5 mm ports were placed in the  patient's right flank under direct vision.  The liver was found to have mild  cirrhosis.  There was no ascites.  The gallbladder was contracted and thick-  walled in appearance and had omentum stuck to it.  The  gallbladder was  retracted above the liver bed and the omentum was slowly peeled off the  gallbladder.  Dissection was then carried out at the base of the  gallbladder.  The patient was found to have an anterior appearing cystic  artery which was clipped twice proximally and once distally and transected  with scissors.  The cystic duct was felt to be quite foreshortened and  therefore cholangiogram was not performed.  It was clipped three times  proximally, once distally, and transected with the scissors as well.  A  posterior cystic branch of the artery was also identified and clipped twice  proximally and once distally and transected as well.  The gallbladder was  then slowly dissected free from the liver bed with electrocautery.  Given  the patient's cirrhosis, a large amount of venous oozing was  found in the  liver bed and this was controlled with electrocautery as well as pieces of  Surgicel.  Once the gallbladder was completely free from the liver bed, it  was placed in an Endosac.  The liver bed was watched very closely and again  hemostasis appeared to be achieved before closing.  Once the gallbladder was  placed in an Endosac, it was removed through the incision at the umbilicus.  The 0 Vicryl at the umbilicus was tied in place closing the fascial defect.  Again the abdomen was irrigated with normal saline.  The liver bed again was  closely examined and hemostasis was felt to be achieved.  At this point all  ports were removed under direct vision and the abdomen was deflated.  All  incisions were then anesthetized with 0.25%  Marcaine and then closed with 4-0 Vicryl subcuticular sutures.  Steri-  Strips, gauze, and tape were then applied.  The patient tolerated the  procedure well.  All needle, sponge, and instrument counts correct at the  end of the procedure.  The patient was then extubated in the operating room  and taken in stable condition to the recovery room.                                                Abigail Miyamoto, M.D.    DB/MEDQ  D:  04/27/2003  T:  04/27/2003  Job:  [id number]

## 2010-09-09 NOTE — Op Note (Signed)
NAME:  Raymond Larsen, Raymond Larsen NO.:  [id number]   MEDICAL RECORD NO.:  [id number]                   PATIENT TYPE:  INP   LOCATION:  2015                                 FACILITY:  [facility]   PHYSICIAN:  Georgiana Spinner, M.D.                 DATE OF BIRTH:  29-Apr-1922   DATE OF PROCEDURE:  04/24/2003  DATE OF DISCHARGE:                                 OPERATIVE REPORT   PROCEDURE:  Colonoscopy.   INDICATIONS:  Question of thickened colon on CT scan.   ANESTHESIA:  Demerol 60 mg, Versed 6 mg.   DESCRIPTION OF PROCEDURE:  With the patient mildly sedated din the left  lateral decubitus position, the Olympus videoscopic colonoscope was inserted  in the rectum and passed under direct vision to the cecum, identified by  ileocecal valve and appendiceal orifice, both of which were photographed.  From this point the colonoscope was slowly withdrawn taking circumferential  views of the colonic mucosa stopping only in the rectum, which appeared  normal on direct and retroflexed view.  The endoscope was straightened and  withdrawn.  The patient's vital signs and pulse oximetry remained stable.  The patient tolerated the procedure well without apparent complications.   FINDINGS:  Occasional diverticula seen in the sigmoid colon, otherwise an  unremarkable examination.   IMPRESSION:  No evidence of malignancy or polyps.  No gastrointestinal  reason discernible to this point as to why the patient is losing weight.   PLAN:  Will get a barium swallow to rule out any hidden stricture of the  esophagus.  Would consider doing calorie counts and watch his weight.  If he  continues to lose weight, assess the next diagnostic or therapeutic  maneuver.                                               Georgiana Spinner, M.D.    GMO/MEDQ  D:  04/24/2003  T:  04/24/2003  Job:  [id number]

## 2011-03-27 ENCOUNTER — Other Ambulatory Visit (INDEPENDENT_AMBULATORY_CARE_PROVIDER_SITE_OTHER): Payer: Self-pay | Admitting: General Surgery

## 2011-03-27 ENCOUNTER — Ambulatory Visit (INDEPENDENT_AMBULATORY_CARE_PROVIDER_SITE_OTHER): Payer: Medicare PPO | Admitting: General Surgery

## 2011-03-27 ENCOUNTER — Encounter (INDEPENDENT_AMBULATORY_CARE_PROVIDER_SITE_OTHER): Payer: Self-pay | Admitting: General Surgery

## 2011-03-27 VITALS — BP 124/70 | HR 64 | Temp 98.4°F | Resp 12 | Ht 67.0 in | Wt 159.2 lb

## 2011-03-27 DIAGNOSIS — K4090 Unilateral inguinal hernia, without obstruction or gangrene, not specified as recurrent: Secondary | ICD-10-CM

## 2011-03-27 NOTE — Progress Notes (Signed)
Patient ID: Raymond Larsen, male   DOB: 05/15/1922, 77 y.o.   MRN: [id number]  Chief Complaint  Patient presents with  . Other    est pt new prob- eval RIH    HPI Raymond Larsen is a 77 y.o. male.   HPI  Raymond Larsen is a patient of Dr. Kerri Perches who presents today for evaluation of a right inguinal hernia. He was lifting some heavy dog food cases and noticed some discomfort and swelling in the right groin. He has no obstructive symptoms. He has had a previous left inguinal hernia repaired multiple times. He is having mild discomfort from his right inguinal hernia. He is interested in having it repaired. He denies difficulty with urination. He denies constipation. Past Medical History  Diagnosis Date  . Arthritis   . Hyperlipidemia   . Hypertension     Past Surgical History  Procedure Date  . Total knee arthroplasty 1994    right  . Total shoulder replacement 1997    right    History reviewed. No pertinent family history.  Social History History  Substance Use Topics  . Smoking status: Never Smoker   . Smokeless tobacco: Not on file  . Alcohol Use: No    Allergies  Allergen Reactions  . Sulfa Antibiotics     No current outpatient prescriptions on file.    Review of Systems Review of Systems  Constitutional: Negative for fever, chills and fatigue.  HENT: Positive for hearing loss.   Eyes: Negative for visual disturbance.  Respiratory: Negative for cough and chest tightness.   Cardiovascular: Negative for chest pain and palpitations.  Gastrointestinal: Negative for abdominal pain and constipation.  Genitourinary: Negative for dysuria and frequency.  Musculoskeletal: Positive for arthralgias.  Neurological: Positive for dizziness.    Blood pressure 124/70, pulse 64, temperature 98.4 F (36.9 C), temperature source Temporal, resp. rate 12, height 5\' 7"  (1.702 m), weight 159 lb 3.2 oz (72.213 kg).  Physical Exam Physical Exam  Constitutional:       Elderly male in  no acute distress  HENT:  Head: Normocephalic and atraumatic.  Neck: Normal range of motion.  Cardiovascular: Normal rate and regular rhythm.   Pulmonary/Chest: Effort normal and breath sounds normal.  Abdominal: Soft. He exhibits no distension and no mass.       Multiple small scars are present.  Genitourinary:       Testicles are normal. Left inguinal scars noted. No left inguinal bulge. There is a reducible right inguinal bulge present.  Musculoskeletal: He exhibits no edema.       Right knee scar is present.  Lymphadenopathy:    He has no cervical adenopathy.  Skin: Skin is warm and dry.  Psychiatric: He has a normal mood and affect. His behavior is normal.    Data Reviewed Dr. Alda Berthold office note.  Old chart.  Assessment    Mildly symptomatic reducible right inguinal hernia. He does have comorbidities but these appeared to be well controlled. He would like to have his hernia repaired. He remains very active.   Plan    We discussed an open right inguinal hernia repair with mesh if there were no cardiac contraindications. He is due to see Dr. Viann Fish, his cardiologist, later this month. We will await the results of that visit to see if he is an appropriate candidate for right inguinal hernia repair.  I have explained the procedure, risks, and aftercare of inguinal hernia repair.  Risks include but are  not limited to bleeding, infection, wound problems, anesthesia, recurrence, bladder or intestine injury, urinary retention, testicular dysfunction, chronic pain, mesh problems.  He seems to understand and agrees to proceed.       Dhani Dannemiller J 03/27/2011, 2:13 PM

## 2011-03-27 NOTE — Patient Instructions (Signed)
We will call you, after you see Dr. Donnie Aho, to discuss scheduling your hernia surgery.

## 2011-04-04 ENCOUNTER — Other Ambulatory Visit (INDEPENDENT_AMBULATORY_CARE_PROVIDER_SITE_OTHER): Payer: Self-pay | Admitting: General Surgery

## 2011-04-25 HISTORY — PX: CATARACT EXTRACTION W/ INTRAOCULAR LENS  IMPLANT, BILATERAL: SHX1307

## 2011-04-25 HISTORY — PX: INGUINAL HERNIA REPAIR: SUR1180

## 2011-04-28 ENCOUNTER — Encounter (INDEPENDENT_AMBULATORY_CARE_PROVIDER_SITE_OTHER): Payer: Self-pay | Admitting: Family Medicine

## 2011-04-28 ENCOUNTER — Encounter (INDEPENDENT_AMBULATORY_CARE_PROVIDER_SITE_OTHER): Payer: Self-pay

## 2011-05-03 ENCOUNTER — Encounter (HOSPITAL_COMMUNITY): Payer: Self-pay

## 2011-05-03 ENCOUNTER — Encounter (HOSPITAL_COMMUNITY)
Admission: RE | Admit: 2011-05-03 | Discharge: 2011-05-03 | Disposition: A | Discharge status: Home / Self Care | Payer: Medicare PPO | Source: Ambulatory Visit | Attending: Anesthesiology | Admitting: Anesthesiology

## 2011-05-03 ENCOUNTER — Encounter (HOSPITAL_COMMUNITY): Payer: Self-pay | Admitting: Respiratory Therapy

## 2011-05-03 ENCOUNTER — Encounter (HOSPITAL_COMMUNITY)
Admission: RE | Admit: 2011-05-03 | Discharge: 2011-05-03 | Disposition: A | Discharge status: Home / Self Care | Payer: Medicare PPO | Source: Ambulatory Visit | Attending: General Surgery | Admitting: General Surgery

## 2011-05-03 HISTORY — DX: Angina pectoris, unspecified: I20.9

## 2011-05-03 HISTORY — DX: Pneumonia, unspecified organism: J18.9

## 2011-05-03 HISTORY — DX: Other constipation: K59.09

## 2011-05-03 HISTORY — DX: Gastro-esophageal reflux disease without esophagitis: K21.9

## 2011-05-03 HISTORY — DX: Hypothyroidism, unspecified: E03.9

## 2011-05-03 HISTORY — DX: Unspecified atrial fibrillation: I48.91

## 2011-05-03 HISTORY — DX: Anxiety disorder, unspecified: F41.9

## 2011-05-03 HISTORY — DX: Personal history of other specified conditions: Z87.898

## 2011-05-03 HISTORY — DX: Unspecified hearing loss, bilateral: H91.93

## 2011-05-03 HISTORY — DX: Frequency of micturition: R35.0

## 2011-05-03 HISTORY — DX: Major depressive disorder, single episode, unspecified: F32.9

## 2011-05-03 HISTORY — DX: Depression, unspecified: F32.A

## 2011-05-03 LAB — CBC
MCH: 31.3 pg (ref 26.0–34.0)
MCV: 91.5 fL (ref 78.0–100.0)
Platelets: 123 10*3/uL — ABNORMAL LOW (ref 150–400)
RDW: 14.4 % (ref 11.5–15.5)
WBC: 4.9 10*3/uL (ref 4.0–10.5)

## 2011-05-03 LAB — DIFFERENTIAL
Lymphocytes Relative: 34 % (ref 12–46)
Lymphs Abs: 1.7 10*3/uL (ref 0.7–4.0)
Monocytes Absolute: 0.4 10*3/uL (ref 0.1–1.0)
Monocytes Relative: 8 % (ref 3–12)
Neutro Abs: 2.5 10*3/uL (ref 1.7–7.7)

## 2011-05-03 LAB — COMPREHENSIVE METABOLIC PANEL
AST: 51 U/L — ABNORMAL HIGH (ref 0–37)
Albumin: 3.4 g/dL — ABNORMAL LOW (ref 3.5–5.2)
Calcium: 9.3 mg/dL (ref 8.4–10.5)
Chloride: 105 mEq/L (ref 96–112)
Creatinine, Ser: 0.92 mg/dL (ref 0.50–1.35)

## 2011-05-03 LAB — SURGICAL PCR SCREEN
MRSA, PCR: NEGATIVE
Staphylococcus aureus: POSITIVE — AB

## 2011-05-03 NOTE — Pre-Procedure Instructions (Signed)
20 Raymond Larsen  05/03/2011   Your procedure is scheduled on:  Tuesday, Jan 15  Report to [facility] at *0530** AM.  Call this number if you have problems the morning of surgery: [phone]   Remember:   Do not eat food:After Midnight.  May have clear liquids: up to 4 Hours before arrival.  Clear liquids include soda, tea, black coffee, apple or grape juice, broth.  Take these medicines the morning of surgery with A SIP OF WATER: *Pain Pill,Ativan, Metoprolol,Protonix**   Do not wear jewelry, make-up or nail polish.  Do not wear lotions, powders, or perfumes. You may wear deodorant.  Do not shave 48 hours prior to surgery.  Do not bring valuables to the hospital.  Contacts, dentures or bridgework may not be worn into surgery.  Leave suitcase in the car. After surgery it may be brought to your room.  For patients admitted to the hospital, checkout time is 11:00 AM the day of discharge.   Patients discharged the day of surgery will not be allowed to drive home.  Name and phone number of your driver: n/a* **  Special Instructions: CHG Shower Use Special Wash: 1/2 bottle night before surgery and 1/2 bottle morning of surgery.   Please read over the following fact sheets that you were given: Pain Booklet, Coughing and Deep Breathing, MRSA Information and Surgical Site Infection Prevention

## 2011-05-08 MED ORDER — CEFAZOLIN SODIUM 1-5 GM-% IV SOLN
1.0000 g | INTRAVENOUS | Status: AC
  Administered 2011-05-09: 1 g via INTRAVENOUS
  Filled 2011-05-08: qty 50

## 2011-05-09 ENCOUNTER — Encounter (HOSPITAL_COMMUNITY): Payer: Self-pay | Admitting: General Practice

## 2011-05-09 ENCOUNTER — Encounter (HOSPITAL_COMMUNITY)
Admission: RE | Disposition: A | Discharge status: Home / Self Care | Payer: Self-pay | Source: Ambulatory Visit | Attending: General Surgery

## 2011-05-09 ENCOUNTER — Ambulatory Visit (HOSPITAL_COMMUNITY): Payer: Medicare PPO | Admitting: Anesthesiology

## 2011-05-09 ENCOUNTER — Inpatient Hospital Stay (HOSPITAL_COMMUNITY)
Admission: RE | Admit: 2011-05-09 | Discharge: 2011-05-10 | DRG: 352 | Disposition: A | Discharge status: Home / Self Care | Payer: Medicare PPO | Source: Ambulatory Visit | Attending: General Surgery | Admitting: General Surgery

## 2011-05-09 ENCOUNTER — Encounter (HOSPITAL_COMMUNITY): Payer: Self-pay | Admitting: Anesthesiology

## 2011-05-09 DIAGNOSIS — Z87891 Personal history of nicotine dependence: Secondary | ICD-10-CM

## 2011-05-09 DIAGNOSIS — I10 Essential (primary) hypertension: Secondary | ICD-10-CM | POA: Diagnosis present

## 2011-05-09 DIAGNOSIS — E785 Hyperlipidemia, unspecified: Secondary | ICD-10-CM | POA: Diagnosis present

## 2011-05-09 DIAGNOSIS — J449 Chronic obstructive pulmonary disease, unspecified: Secondary | ICD-10-CM | POA: Diagnosis present

## 2011-05-09 DIAGNOSIS — I4891 Unspecified atrial fibrillation: Secondary | ICD-10-CM | POA: Diagnosis present

## 2011-05-09 DIAGNOSIS — K4090 Unilateral inguinal hernia, without obstruction or gangrene, not specified as recurrent: Principal | ICD-10-CM | POA: Diagnosis present

## 2011-05-09 DIAGNOSIS — E039 Hypothyroidism, unspecified: Secondary | ICD-10-CM | POA: Diagnosis present

## 2011-05-09 DIAGNOSIS — Z7901 Long term (current) use of anticoagulants: Secondary | ICD-10-CM

## 2011-05-09 DIAGNOSIS — J4489 Other specified chronic obstructive pulmonary disease: Secondary | ICD-10-CM | POA: Diagnosis present

## 2011-05-09 DIAGNOSIS — Z96619 Presence of unspecified artificial shoulder joint: Secondary | ICD-10-CM

## 2011-05-09 DIAGNOSIS — K5900 Constipation, unspecified: Secondary | ICD-10-CM | POA: Diagnosis present

## 2011-05-09 DIAGNOSIS — Z01812 Encounter for preprocedural laboratory examination: Secondary | ICD-10-CM

## 2011-05-09 DIAGNOSIS — Z96659 Presence of unspecified artificial knee joint: Secondary | ICD-10-CM

## 2011-05-09 DIAGNOSIS — F411 Generalized anxiety disorder: Secondary | ICD-10-CM | POA: Diagnosis present

## 2011-05-09 DIAGNOSIS — K219 Gastro-esophageal reflux disease without esophagitis: Secondary | ICD-10-CM | POA: Diagnosis present

## 2011-05-09 DIAGNOSIS — M129 Arthropathy, unspecified: Secondary | ICD-10-CM | POA: Diagnosis present

## 2011-05-09 HISTORY — PX: INGUINAL HERNIA REPAIR: SHX194

## 2011-05-09 LAB — PROTIME-INR
INR: 1.44 (ref 0.00–1.49)
Prothrombin Time: 17.8 seconds — ABNORMAL HIGH (ref 11.6–15.2)

## 2011-05-09 SURGERY — REPAIR, HERNIA, INGUINAL, ADULT
Anesthesia: General | Laterality: Right | Wound class: Clean

## 2011-05-09 MED ORDER — LORAZEPAM 1 MG PO TABS
1.0000 mg | ORAL_TABLET | Freq: Two times a day (BID) | ORAL | Status: DC
  Administered 2011-05-09 (×2): 1 mg via ORAL
  Filled 2011-05-09 (×2): qty 1

## 2011-05-09 MED ORDER — ROCURONIUM BROMIDE 100 MG/10ML IV SOLN
INTRAVENOUS | Status: DC | PRN
  Administered 2011-05-09: 20 mg via INTRAVENOUS

## 2011-05-09 MED ORDER — PROMETHAZINE HCL 25 MG/ML IJ SOLN: 6.2500 mg | INTRAMUSCULAR | Status: DC | PRN

## 2011-05-09 MED ORDER — MIRTAZAPINE 30 MG PO TABS
30.0000 mg | ORAL_TABLET | Freq: Every day | ORAL | Status: DC
  Administered 2011-05-09: 30 mg via ORAL
  Filled 2011-05-09 (×2): qty 1

## 2011-05-09 MED ORDER — LEVOTHYROXINE SODIUM 125 MCG PO TABS
125.0000 ug | ORAL_TABLET | Freq: Every day | ORAL | Status: DC
  Filled 2011-05-09 (×2): qty 1

## 2011-05-09 MED ORDER — METOPROLOL TARTRATE 25 MG PO TABS
25.0000 mg | ORAL_TABLET | Freq: Two times a day (BID) | ORAL | Status: DC
  Administered 2011-05-09 (×2): 25 mg via ORAL
  Filled 2011-05-09 (×5): qty 1

## 2011-05-09 MED ORDER — HYDROCODONE-ACETAMINOPHEN 5-325 MG PO TABS
1.0000 | ORAL_TABLET | ORAL | Status: DC | PRN
  Administered 2011-05-10: 2 via ORAL
  Filled 2011-05-09: qty 2

## 2011-05-09 MED ORDER — MEPERIDINE HCL 25 MG/ML IJ SOLN: 6.2500 mg | INTRAMUSCULAR | Status: DC | PRN

## 2011-05-09 MED ORDER — HYDROMORPHONE HCL PF 1 MG/ML IJ SOLN: 0.2500 mg | INTRAMUSCULAR | Status: DC | PRN

## 2011-05-09 MED ORDER — TEMAZEPAM 15 MG PO CAPS: 15.0000 mg | ORAL_CAPSULE | Freq: Every evening | ORAL | Status: DC | PRN

## 2011-05-09 MED ORDER — GLYCOPYRROLATE 0.2 MG/ML IJ SOLN
INTRAMUSCULAR | Status: DC | PRN
  Administered 2011-05-09: .4 mg via INTRAVENOUS

## 2011-05-09 MED ORDER — ONDANSETRON HCL 4 MG/2ML IJ SOLN: 4.0000 mg | Freq: Four times a day (QID) | INTRAMUSCULAR | Status: DC | PRN

## 2011-05-09 MED ORDER — PANTOPRAZOLE SODIUM 40 MG PO TBEC: 40.0000 mg | DELAYED_RELEASE_TABLET | Freq: Every day | ORAL | Status: DC

## 2011-05-09 MED ORDER — ONDANSETRON HCL 4 MG PO TABS: 4.0000 mg | ORAL_TABLET | Freq: Four times a day (QID) | ORAL | Status: DC | PRN

## 2011-05-09 MED ORDER — BUPIVACAINE HCL (PF) 0.25 % IJ SOLN
INTRAMUSCULAR | Status: DC | PRN
  Administered 2011-05-09: 10 mL

## 2011-05-09 MED ORDER — NITROGLYCERIN 0.4 MG SL SUBL: 0.4000 mg | SUBLINGUAL_TABLET | SUBLINGUAL | Status: DC | PRN

## 2011-05-09 MED ORDER — SUFENTANIL CITRATE 50 MCG/ML IV SOLN
INTRAVENOUS | Status: DC | PRN
  Administered 2011-05-09: 10 ug via INTRAVENOUS

## 2011-05-09 MED ORDER — PHENYLEPHRINE HCL 10 MG/ML IJ SOLN
10.0000 mg | INTRAVENOUS | Status: DC | PRN
  Administered 2011-05-09: 10 ug/min via INTRAVENOUS

## 2011-05-09 MED ORDER — LIDOCAINE HCL (CARDIAC) 20 MG/ML IV SOLN
INTRAVENOUS | Status: DC | PRN
  Administered 2011-05-09: 60 mg via INTRAVENOUS

## 2011-05-09 MED ORDER — AMIODARONE HCL 100 MG PO TABS
100.0000 mg | ORAL_TABLET | ORAL | Status: DC
  Filled 2011-05-09: qty 1

## 2011-05-09 MED ORDER — FINASTERIDE 5 MG PO TABS
5.0000 mg | ORAL_TABLET | Freq: Every day | ORAL | Status: DC
  Administered 2011-05-09: 5 mg via ORAL
  Filled 2011-05-09 (×3): qty 1

## 2011-05-09 MED ORDER — PROPOFOL 10 MG/ML IV EMUL
INTRAVENOUS | Status: DC | PRN
  Administered 2011-05-09: 70 mg via INTRAVENOUS

## 2011-05-09 MED ORDER — LACTATED RINGERS IV SOLN
INTRAVENOUS | Status: DC | PRN
  Administered 2011-05-09 (×2): via INTRAVENOUS

## 2011-05-09 MED ORDER — KCL IN DEXTROSE-NACL 20-5-0.9 MEQ/L-%-% IV SOLN
INTRAVENOUS | Status: DC
  Administered 2011-05-09: 12:00:00 via INTRAVENOUS
  Filled 2011-05-09 (×2): qty 1000

## 2011-05-09 MED ORDER — MECLIZINE HCL 25 MG PO TABS
25.0000 mg | ORAL_TABLET | Freq: Three times a day (TID) | ORAL | Status: DC | PRN
  Filled 2011-05-09: qty 1

## 2011-05-09 MED ORDER — BUPIVACAINE LIPOSOME 1.3 % IJ SUSP
20.0000 mL | INTRAMUSCULAR | Status: AC
  Administered 2011-05-09: 20 mL
  Filled 2011-05-09: qty 20

## 2011-05-09 MED ORDER — FENOFIBRATE 160 MG PO TABS
160.0000 mg | ORAL_TABLET | Freq: Every day | ORAL | Status: DC
  Administered 2011-05-09: 160 mg via ORAL
  Filled 2011-05-09 (×2): qty 1

## 2011-05-09 MED ORDER — NEOSTIGMINE METHYLSULFATE 1 MG/ML IJ SOLN
INTRAMUSCULAR | Status: DC | PRN
  Administered 2011-05-09: 3 mg via INTRAVENOUS

## 2011-05-09 SURGICAL SUPPLY — 50 items
APL SKNCLS STERI-STRIP NONHPOA (GAUZE/BANDAGES/DRESSINGS) ×1
BENZOIN TINCTURE PRP APPL 2/3 (GAUZE/BANDAGES/DRESSINGS) ×2 IMPLANT
BLADE SURG 10 STRL SS (BLADE) ×2 IMPLANT
BLADE SURG 15 STRL LF DISP TIS (BLADE) ×1 IMPLANT
BLADE SURG 15 STRL SS (BLADE) ×2
BLADE SURG ROTATE 9660 (MISCELLANEOUS) ×1 IMPLANT
CHLORAPREP W/TINT 26ML (MISCELLANEOUS) ×2 IMPLANT
CLOSURE STERI STRIP 1/2 X4 (GAUZE/BANDAGES/DRESSINGS) ×1 IMPLANT
CLOTH BEACON ORANGE TIMEOUT ST (SAFETY) ×2 IMPLANT
COVER SURGICAL LIGHT HANDLE (MISCELLANEOUS) ×2 IMPLANT
DRAIN PENROSE 1/2X12 LTX STRL (WOUND CARE) ×1 IMPLANT
DRAPE INCISE IOBAN 66X45 STRL (DRAPES) ×2 IMPLANT
DRAPE LAPAROTOMY TRNSV 102X78 (DRAPE) ×2 IMPLANT
DRAPE UTILITY 15X26 W/TAPE STR (DRAPE) ×4 IMPLANT
DRESSING TELFA 8X3 (GAUZE/BANDAGES/DRESSINGS) ×2 IMPLANT
DRSG OPSITE 6X11 MED (GAUZE/BANDAGES/DRESSINGS) ×2 IMPLANT
ELECT CAUTERY BLADE 6.4 (BLADE) ×2 IMPLANT
ELECT REM PT RETURN 9FT ADLT (ELECTROSURGICAL) ×2
ELECTRODE REM PT RTRN 9FT ADLT (ELECTROSURGICAL) ×1 IMPLANT
GLOVE BIOGEL PI IND STRL 6.5 (GLOVE) IMPLANT
GLOVE BIOGEL PI IND STRL 7.5 (GLOVE) IMPLANT
GLOVE BIOGEL PI IND STRL 8 (GLOVE) ×1 IMPLANT
GLOVE BIOGEL PI INDICATOR 6.5 (GLOVE) ×1
GLOVE BIOGEL PI INDICATOR 7.5 (GLOVE) ×1
GLOVE BIOGEL PI INDICATOR 8 (GLOVE) ×1
GLOVE ECLIPSE 8.0 STRL XLNG CF (GLOVE) ×2 IMPLANT
GOWN STRL NON-REIN LRG LVL3 (GOWN DISPOSABLE) ×4 IMPLANT
KIT BASIN OR (CUSTOM PROCEDURE TRAY) ×2 IMPLANT
KIT ROOM TURNOVER OR (KITS) ×2 IMPLANT
MESH HERNIA 3X6 (Mesh General) ×1 IMPLANT
NDL HYPO 25GX1X1/2 BEV (NEEDLE) ×1 IMPLANT
NEEDLE HYPO 25GX1X1/2 BEV (NEEDLE) ×2 IMPLANT
NS IRRIG 1000ML POUR BTL (IV SOLUTION) ×2 IMPLANT
PACK SURGICAL SETUP 50X90 (CUSTOM PROCEDURE TRAY) ×2 IMPLANT
PAD ARMBOARD 7.5X6 YLW CONV (MISCELLANEOUS) ×4 IMPLANT
PENCIL BUTTON HOLSTER BLD 10FT (ELECTRODE) ×2 IMPLANT
SPECIMEN JAR SMALL (MISCELLANEOUS) IMPLANT
SPONGE LAP 18X18 X RAY DECT (DISPOSABLE) ×2 IMPLANT
STRIP CLOSURE SKIN 1/2X4 (GAUZE/BANDAGES/DRESSINGS) ×2 IMPLANT
SUT MON AB 4-0 PC3 18 (SUTURE) ×2 IMPLANT
SUT PROLENE 2 0 CT2 30 (SUTURE) ×4 IMPLANT
SUT SILK 2 0 SH (SUTURE) IMPLANT
SUT VIC AB 2-0 SH 18 (SUTURE) ×2 IMPLANT
SUT VIC AB 3-0 SH 27 (SUTURE) ×2
SUT VIC AB 3-0 SH 27XBRD (SUTURE) ×1 IMPLANT
SUT VICRYL AB 3 0 TIES (SUTURE) ×2 IMPLANT
SYR CONTROL 10ML LL (SYRINGE) ×2 IMPLANT
TOWEL OR 17X24 6PK STRL BLUE (TOWEL DISPOSABLE) ×2 IMPLANT
TOWEL OR 17X26 10 PK STRL BLUE (TOWEL DISPOSABLE) ×2 IMPLANT
WATER STERILE IRR 1000ML POUR (IV SOLUTION) IMPLANT

## 2011-05-09 NOTE — H&P (Signed)
Raymond Larsen is an 78 y.o. male.   Chief Complaint: Here for right inguinal hernia (RIH) repair HPI: 78 year old male with a right inguinal hernia that bothers him at times.  He has a h/o of left inguinal hernia repair in the past.  He is here for elective RIH repair.  Past Medical History  Diagnosis Date  . Arthritis   . Hyperlipidemia   . Hypertension   . Dysrhythmia   . A-fib     Dr Donnie Aho; on Coumadin  . Angina     uses NTG prn; last episode 3 mo  . Pneumonia     12 /2012  . Recurrent upper respiratory infection (URI)     03/2011  . Hypothyroidism     x 10 yrs  . GERD (gastroesophageal reflux disease)   . Constipation, chronic   . Depression   . History of dizziness   . Anxiety   . Urinary frequency   . Hearing loss of both ears     wears hearing aides  . Enlarged prostate with lower urinary tract symptoms (LUTS)     sees Dr Patsi Sears    Past Surgical History  Procedure Date  . Total knee arthroplasty 1994    right  . Total shoulder replacement 1997    right  . Joint replacement     Rt knee and shoulder  . Hernia repair 1990    LIH repair  . Cholecystectomy 2004    No family history on file. Social History:  reports that he quit smoking about 27 years ago. His smoking use included Cigarettes. He has a 17.5 pack-year smoking history. His smokeless tobacco use includes Chew. He reports that he does not drink alcohol or use illicit drugs.  Allergies:  Allergies  Allergen Reactions  . Amitriptyline   . Beta Adrenergic Blockers   . Niacin And Related   . Sulfa Antibiotics Itching and Rash    Medications Prior to Admission  Medication Dose Route Frequency Provider Last Rate Last Dose  . ceFAZolin (ANCEF) IVPB 1 g/50 mL premix  1 g Intravenous 60 min Pre-Op Adolph Pollack, MD       Medications Prior to Admission  Medication Sig Dispense Refill  . amiodarone (PACERONE) 200 MG tablet Take 100 mg by mouth every Monday, Wednesday, and Friday.       .  Calcium Carbonate (CALCIUM 500 PO) Take 1 tablet by mouth daily.       . Cyanocobalamin (VITAMIN B-12 PO) Take 1,000 mcg by mouth daily.       . fenofibrate 160 MG tablet Take 160 mg by mouth daily.       . finasteride (PROSCAR) 5 MG tablet Take 5 mg by mouth daily.       Marland Kitchen HYDROcodone-acetaminophen (VICODIN) 5-500 MG per tablet Take 1 tablet by mouth every 6 (six) hours as needed. For pain      . levothyroxine (SYNTHROID, LEVOTHROID) 125 MCG tablet Take 125 mcg by mouth daily.       Marland Kitchen LORazepam (ATIVAN) 1 MG tablet Take 1 mg by mouth 2 (two) times daily.       . meclizine (ANTIVERT) 25 MG tablet Take 25 mg by mouth 3 (three) times daily as needed. For dizziness       . metoprolol tartrate (LOPRESSOR) 25 MG tablet Take 25 mg by mouth 2 (two) times daily.       . mirtazapine (REMERON) 30 MG tablet Take 30 mg by mouth  At bedtime.       . nitroGLYCERIN (NITROSTAT) 0.4 MG SL tablet Place 0.4 mg under the tongue every 5 (five) minutes as needed. For chest pain      . Omega-3 Fatty Acids (FISH OIL) 1000 MG CAPS Take 1,000 mg by mouth daily.       . pantoprazole (PROTONIX) 40 MG tablet Take 40 mg by mouth daily.       . polyethylene glycol (MIRALAX / GLYCOLAX) packet Take 17 g by mouth daily.       . pravastatin (PRAVACHOL) 40 MG tablet Take 80 mg by mouth daily.       . temazepam (RESTORIL) 15 MG capsule Take 15 mg by mouth At bedtime.       . traMADol-acetaminophen (ULTRACET) 37.5-325 MG per tablet Take 1 tablet by mouth every 6 (six) hours as needed. For pain      . warfarin (COUMADIN) 3 MG tablet Take 3 mg by mouth daily. Take 1/2 a tablet on sundays and wednesdays. Take 1 tablet the rest of the week        No results found for this or any previous visit (from the past 48 hour(s)). No results found.  Review of Systems  Constitutional: Negative for fever and chills.  HENT: Negative for congestion and sore throat.   Respiratory: Negative for cough.     Blood pressure 143/77, pulse 66,  temperature 97.8 F (36.6 C), temperature source Oral, resp. rate 16, SpO2 95.00%. Physical Exam  Constitutional:       Elderly male in NAD.  HENT:  Head: Normocephalic and atraumatic.  Neck: Neck supple.  Cardiovascular: Normal rate and regular rhythm.   Respiratory: Effort normal and breath sounds normal.  GI: Soft. He exhibits no mass. There is no tenderness.  Genitourinary:       Right inguinal bulge.  Left inguinal scar.  Musculoskeletal:       Right knee scar.  Lymphadenopathy:    He has no cervical adenopathy.  Skin: Skin is warm and dry.     Assessment/Plan Symptomatic RIH  Plan:  RIH repair.  [name] 05/09/2011, 7:31 AM

## 2011-05-09 NOTE — Anesthesia Postprocedure Evaluation (Signed)
  Anesthesia Post-op Note  Patient: Raymond Larsen  Procedure(s) Performed:  HERNIA REPAIR INGUINAL ADULT  Patient Location: PACU  Anesthesia Type: General  Level of Consciousness: awake  Airway and Oxygen Therapy: Patient Spontanous Breathing and Patient connected to nasal cannula oxygen  Post-op Pain: none  Post-op Assessment: Post-op Vital signs reviewed, Patient's Cardiovascular Status Stable, Respiratory Function Stable and Patent Airway  Post-op Vital Signs: Reviewed and stable  Complications: No apparent anesthesia complications

## 2011-05-09 NOTE — Anesthesia Preprocedure Evaluation (Addendum)
Anesthesia Evaluation  Patient identified by MRN, date of birth, ID band Patient awake    Reviewed: Allergy & Precautions, H&P , NPO status , Patient's Chart, lab work & pertinent test results, reviewed documented beta blocker date and time   Airway Mallampati: II TM Distance: >3 FB Neck ROM: Full    Dental No notable dental hx. (+) Edentulous Upper and Edentulous Lower   Pulmonary pneumonia , Recent URI ,  clear to auscultation  Pulmonary exam normal       Cardiovascular hypertension, On Medications and On Home Beta Blockers + angina + dysrhythmias Atrial Fibrillation Regular Normal    Neuro/Psych Negative Neurological ROS  Negative Psych ROS   GI/Hepatic Neg liver ROS, GERD-  Medicated and Controlled,  Endo/Other  Negative Endocrine ROS  Renal/GU negative Renal ROS  Genitourinary negative   Musculoskeletal   Abdominal   Peds  Hematology negative hematology ROS (+)   Anesthesia Other Findings   Reproductive/Obstetrics negative OB ROS                          Anesthesia Physical Anesthesia Plan  ASA: III  Anesthesia Plan: General   Post-op Pain Management:    Induction: Intravenous  Airway Management Planned: Oral ETT  Additional Equipment:   Intra-op Plan:   Post-operative Plan: Extubation in OR  Informed Consent: I have reviewed the patients History and Physical, chart, labs and discussed the procedure including the risks, benefits and alternatives for the proposed anesthesia with the patient or authorized representative who has indicated his/her understanding and acceptance.     Plan Discussed with: CRNA  Anesthesia Plan Comments:         Anesthesia Quick Evaluation

## 2011-05-09 NOTE — Interval H&P Note (Signed)
History and Physical Interval Note:  05/09/2011 7:34 AM  Raymond Larsen  has presented today for surgery, with the diagnosis of Right Inguinal Hernia  The various methods of treatment have been discussed with the patient and family. After consideration of risks, benefits and other options for treatment, the patient has consented to  Procedure(s): HERNIA REPAIR INGUINAL ADULT as a surgical intervention .  The patients' history has been reviewed, patient examined, no change in status, stable for surgery.  I have reviewed the patients' chart and labs.  Questions were answered to the patient's satisfaction.     Rhena Glace Shela Commons

## 2011-05-09 NOTE — Transfer of Care (Signed)
Immediate Anesthesia Transfer of Care Note  Patient: Raymond Larsen  Procedure(s) Performed:  HERNIA REPAIR INGUINAL ADULT  Patient Location: PACU  Anesthesia Type: General  Level of Consciousness: awake, alert , oriented and patient cooperative  Airway & Oxygen Therapy: Patient Spontanous Breathing and Patient connected to face mask oxygen  Post-op Assessment: Report given to PACU RN, Post -op Vital signs reviewed and stable and Patient moving all extremities  Post vital signs: Reviewed and stable Filed Vitals:   05/09/11 0645  BP: 143/77  Pulse: 66  Temp: 36.6 C  Resp: 16    Complications: No apparent anesthesia complications

## 2011-05-09 NOTE — Preoperative (Signed)
Beta Blockers   Reason not to administer Beta Blockers:metoprolol 0330 05/09/2011

## 2011-05-09 NOTE — Op Note (Signed)
Preoperative diagnosis:  Right inguinal hernia.  Postoperative diagnosis:  Same (Indirect)  Procedure:  Right inguinal hernia repair with mesh.  Surgeon:  Avel Peace, M.D.  Anesthesia:  General with local (short and long acting Marcaine).  Indication:  Raymond Larsen Is an 78 year old male who was lifting a heavy bag and felt pain in the right groin. He subsequently developed swelling and was found to have a right inguinal hernia. He does bother him at times. He presents now for repair.  Technique:  The patient was seen in the holding room and the right groin was marked with my initials. The patient was brought to the operating, placed supine on the operating table, and the anesthetic was administered by the anesthesiologist. The hair in the groin area was clipped as was felt to be necessary. This area was then sterilely prepped and draped.  Local anesthetic was infiltrated in the superficial and deep tissues in the right groin.  An incision was made through the skin and subcutaneous tissue until the external oblique aponeurosis was identified.  Local anesthetic was infiltrated deep to the external oblique aponeurosis. The external oblique aponeurosis was divided through the external ring medially and back toward the anterior superior iliac spine laterally. Using blunt dissection, the shelving edge of the inguinal ligament was identified inferiorly and the internal oblique aponeurosis and muscle were identified superiorly. The ilioinguinal nerve was identified and preserved.  The spermatic cord was isolated and a posterior window was made around it. An indirect hernia was noted and the indirect hernia sac was identified and separated from the spermatic cord using blunt dissection. The hernia sac and its contents were reduced through the indirect hernia defect.   A piece of 3" x 6" polypropylene mesh was brought into the field and anchored 1-2 cm medial to the pubic tubercle with 2-0 Prolene suture.  The inferior aspect of the mesh was anchored to the shelving edge of the inguinal ligament with running 2-0 Prolene suture to a level 1-2 cm lateral to the internal ring. A slit was cut in the mesh creating 2 tails. These were wrapped around the spermatic cord. The superior aspect of the mesh was anchored to the internal oblique aponeurosis and muscle with interrupted 2-0 Vicryl sutures. The 2 tails of the mesh were then crossed creating a new internal ring and were anchored to the shelving edge of the inguinal ligament with 2-0 Prolene suture. The tip of a hemostat could be placed through the new aperture. The lateral aspect of the mesh was then tucked deep to the external oblique aponeurosis.  More local anesthetic was infiltrated into the wound.  The wound was inspected and hemostasis was adequate. The external oblique aponeurosis was then closed over the mesh and cord with running 3-0 Vicryl suture. The subcutaneous tissue was closed with running 3-0 Vicryl suture. The skin closed with a running 4-0 Monocryl subcuticular stitch.  Steri-Strips and a sterile dressing were applied.  The procedure was well-tolerated without any apparent complications and the patient was taken to the recovery room in satisfactory condition.

## 2011-05-10 MED ORDER — METOPROLOL TARTRATE 25 MG PO TABS: 25.0000 mg | ORAL_TABLET | Freq: Two times a day (BID) | ORAL | Status: DC

## 2011-05-10 MED ORDER — METOPROLOL TARTRATE 25 MG PO TABS
25.0000 mg | ORAL_TABLET | Freq: Two times a day (BID) | ORAL | Status: DC
  Administered 2011-05-10: 25 mg via ORAL
  Filled 2011-05-10 (×2): qty 1

## 2011-05-10 MED ORDER — HYDROCODONE-ACETAMINOPHEN 5-325 MG PO TABS: 1.0000 | ORAL_TABLET | ORAL | Status: AC | PRN

## 2011-05-10 NOTE — Progress Notes (Signed)
1 Day Post-Op  Subjective: A little sore.  Voiding well.  Did not sleep much.  Objective: Vital signs in last 24 hours: Temp:  [97 F (36.1 C)-99.7 F (37.6 C)] 99.7 F (37.6 C) (01/16 0528) Pulse Rate:  [60-77] 77  (01/16 0528) Resp:  [18-20] 20  (01/16 0528) BP: (157-186)/(70-99) 186/99 mmHg (01/16 0528) SpO2:  [93 %-97 %] 95 % (01/16 0528) Weight:  [163 lb 9.3 oz (74.2 kg)] 163 lb 9.3 oz (74.2 kg) (01/15 1213) Last BM Date: 05/09/11  Intake/Output from previous day: 01/15 0701 - 01/16 0700 In: 3327.5 [P.O.:840; I.V.:2487.5] Out: 3110 [Urine:3100; Blood:10] Intake/Output this shift:    PE: Left groin- dressing dry, small amount of swelling present  Lab Results:  No results found for this basename: WBC:2,HGB:2,HCT:2,PLT:2 in the last 72 hours BMET No results found for this basename: NA:2,K:2,CL:2,CO2:2,GLUCOSE:2,BUN:2,CREATININE:2,CALCIUM:2 in the last 72 hours PT/INR  Basename 05/09/11 0650  LABPROT 17.8*  INR 1.44   Comprehensive Metabolic Panel:    Component Value Date/Time   NA 140 05/03/2011 1508   K 4.7 05/03/2011 1508   CL 105 05/03/2011 1508   CO2 28 05/03/2011 1508   BUN 19 05/03/2011 1508   CREATININE 0.92 05/03/2011 1508   GLUCOSE 111* 05/03/2011 1508   CALCIUM 9.3 05/03/2011 1508   AST 51* 05/03/2011 1508   ALT 23 05/03/2011 1508   ALKPHOS 135* 05/03/2011 1508   BILITOT 0.5 05/03/2011 1508   PROT 5.9* 05/03/2011 1508   ALBUMIN 3.4* 05/03/2011 1508     Studies/Results: No results found.  Anti-infectives: Anti-infectives     Start     Dose/Rate Route Frequency Ordered Stop   05/08/11 1515   ceFAZolin (ANCEF) IVPB 1 g/50 mL premix        1 g 100 mL/hr over 30 Minutes Intravenous 60 min pre-op 05/08/11 1511 05/09/11 0750          Assessment S/p RIH repair-doing well     LOS: 1 day   Plan: Discharge.  Instructions given.   Cadyn Fann J 05/10/2011

## 2011-05-10 NOTE — Progress Notes (Signed)
IV removed, site unremarkable.  Bandage to right groin area is clean, dry, and intact.  Discharge instructions and prescriptions given.  Pt instructed when to call the doctor.  All belongings sent with pt and all questions answered.  Pt transported in wheelchair by volunteer and travelled home with family member by private vehicle.

## 2011-05-10 NOTE — Discharge Summary (Signed)
Physician Discharge Summary  Patient ID: Raymond Larsen MRN: [id number] DOB/AGE: 11-10-1922 78 y.o.  Admit date: 05/09/2011 Discharge date: 05/10/2011  Admission Diagnoses:  Right inguinal hernia  Discharge Diagnoses: Right inguinal hernia s/p repair HTN CAD GERD     Discharged Condition: good  Hospital Course: He underwent RIH repair with mesh on 05/09/11 and tolerated it well.  He had an unremarkable postop course and was able to be discharged on 05/10/11.  Specific discharge instructions were given.  Consults: none  Significant Diagnostic Studies: none  Treatments: surgery: RIH repair with mesh 05/09/11.  Discharge Exam: Blood pressure 186/99, pulse 77, temperature 99.7 F (37.6 C), temperature source Oral, resp. rate 20, height 5\' 7"  (1.702 m), weight 163 lb 9.3 oz (74.2 kg), SpO2 95.00%. Dressing dry.  Disposition: Discharge to home in satisfactory condition.  Discharge instructions were given.  He is to restart his Coumadin and should have his INR checked in about 5 days,.   Medication List  As of 05/10/2011  7:52 AM   STOP taking these medications         HYDROcodone-acetaminophen 5-500 MG per tablet         TAKE these medications         amiodarone 200 MG tablet   Commonly known as: PACERONE   Take 100 mg by mouth every Monday, Wednesday, and Friday.      CALCIUM 500 PO   Take 1 tablet by mouth daily.      fenofibrate 160 MG tablet   Take 160 mg by mouth daily.      finasteride 5 MG tablet   Commonly known as: PROSCAR   Take 5 mg by mouth daily.      Fish Oil 1000 MG Caps   Take 1,000 mg by mouth daily.      HYDROcodone-acetaminophen 5-325 MG per tablet   Commonly known as: NORCO   Take 1-2 tablets by mouth every 4 (four) hours as needed.      levothyroxine 125 MCG tablet   Commonly known as: SYNTHROID, LEVOTHROID   Take 125 mcg by mouth daily.      LORazepam 1 MG tablet   Commonly known as: ATIVAN   Take 1 mg by mouth 2 (two) times daily.     meclizine 25 MG tablet   Commonly known as: ANTIVERT   Take 25 mg by mouth 3 (three) times daily as needed. For dizziness        metoprolol tartrate 25 MG tablet   Commonly known as: LOPRESSOR   Take 25 mg by mouth 2 (two) times daily.      mirtazapine 30 MG tablet   Commonly known as: REMERON   Take 30 mg by mouth At bedtime.      nitroGLYCERIN 0.4 MG SL tablet   Commonly known as: NITROSTAT   Place 0.4 mg under the tongue every 5 (five) minutes as needed. For chest pain      pantoprazole 40 MG tablet   Commonly known as: PROTONIX   Take 40 mg by mouth daily.      polyethylene glycol packet   Commonly known as: MIRALAX / GLYCOLAX   Take 17 g by mouth daily.      pravastatin 40 MG tablet   Commonly known as: PRAVACHOL   Take 80 mg by mouth daily.      temazepam 15 MG capsule   Commonly known as: RESTORIL   Take 15 mg by mouth At bedtime.  traMADol-acetaminophen 37.5-325 MG per tablet   Commonly known as: ULTRACET   Take 1 tablet by mouth every 6 (six) hours as needed. For pain      VITAMIN B-12 PO   Take 1,000 mcg by mouth daily.      warfarin 3 MG tablet   Commonly known as: COUMADIN   Take 3 mg by mouth daily. Take 1/2 a tablet on sundays and wednesdays. Take 1 tablet the rest of the week             Signed: Chandy Tarman J 05/10/2011, 7:52 AM

## 2011-05-10 NOTE — Progress Notes (Signed)
MD notified of patient's VS.  BP 186/99, T 99.7, P 77, R 20, 95% RA.  Patient has no BP prns.  MD ordered 10am dose of metroprolol be given now.  Will continue to monitor patient. Loucinda Croy, Joan Mayans, RN

## 2011-05-11 ENCOUNTER — Encounter (HOSPITAL_COMMUNITY): Payer: Self-pay | Admitting: General Surgery

## 2011-05-26 ENCOUNTER — Encounter (INDEPENDENT_AMBULATORY_CARE_PROVIDER_SITE_OTHER): Payer: Medicare PPO | Admitting: General Surgery

## 2011-06-13 ENCOUNTER — Encounter (INDEPENDENT_AMBULATORY_CARE_PROVIDER_SITE_OTHER): Payer: Self-pay | Admitting: General Surgery

## 2011-06-13 ENCOUNTER — Ambulatory Visit (INDEPENDENT_AMBULATORY_CARE_PROVIDER_SITE_OTHER): Payer: Medicare PPO | Admitting: General Surgery

## 2011-06-13 VITALS — BP 124/84 | Temp 97.4°F | Ht 69.0 in | Wt 161.0 lb

## 2011-06-13 DIAGNOSIS — Z9889 Other specified postprocedural states: Secondary | ICD-10-CM

## 2011-06-13 NOTE — Progress Notes (Addendum)
He presents for postop followup after open right inguinal hernia repair with mesh.  Post op pain is improving.  No difficulty voiding or having BMs.  Swelling is decreasing.  P.E.   GU:  Right inguinal incision clean/dry/intact, swelling is minimal, repair is solid.  Assessment:  Doing well post hernia repair.  Plan:  Continue light activities for 8 weeks postop then slowly start to resume normal activities. Return visit as needed.

## 2011-06-13 NOTE — Patient Instructions (Signed)
Continue light activities for 3 more weeks then resume normal activities.

## 2011-06-27 ENCOUNTER — Other Ambulatory Visit: Payer: Self-pay | Admitting: Cardiology

## 2011-08-04 ENCOUNTER — Telehealth (INDEPENDENT_AMBULATORY_CARE_PROVIDER_SITE_OTHER): Payer: Self-pay

## 2011-08-04 NOTE — Telephone Encounter (Signed)
The patient's daughter called about Mr Raymond Larsen and said he was in his garden last week and may have pulled something.  He has some swelling, redness and pain where his surgery was.  He can't come today but she would like him seen by Dr Abbey Chatters next week if he can be worked in.  Please call the daughter.

## 2011-08-08 ENCOUNTER — Ambulatory Visit (INDEPENDENT_AMBULATORY_CARE_PROVIDER_SITE_OTHER): Payer: Medicare PPO | Admitting: General Surgery

## 2011-08-08 ENCOUNTER — Encounter (INDEPENDENT_AMBULATORY_CARE_PROVIDER_SITE_OTHER): Payer: Self-pay | Admitting: General Surgery

## 2011-08-08 VITALS — BP 122/70 | HR 72 | Temp 98.4°F | Resp 18 | Ht 67.0 in | Wt 158.0 lb

## 2011-08-08 DIAGNOSIS — R21 Rash and other nonspecific skin eruption: Secondary | ICD-10-CM

## 2011-08-08 NOTE — Progress Notes (Signed)
Patient ID: Brenin Heidelberger Sr., male   DOB: 05-19-1922, 78 y.o.   MRN: [id number]  Chief Complaint  Patient presents with  . Follow-up    reck new pain at hernia site    HPI Crixus Mcaulay Sr. is a 78 y.o. male.   HPI  He is here because of a painful rash down in the right groin. He was riding in the car and got sweaty and then began having itching and pain. He is tried putting some cream on the area but the pain persists.  Past Medical History  Diagnosis Date  . Arthritis   . Hyperlipidemia   . Hypertension   . Dysrhythmia   . A-fib     Dr Donnie Aho; on Coumadin  . Angina     uses NTG prn; last episode 3 mo  . Pneumonia     12 /2012  . Recurrent upper respiratory infection (URI)     03/2011  . Hypothyroidism     x 10 yrs  . GERD (gastroesophageal reflux disease)   . Constipation, chronic   . Depression   . History of dizziness   . Anxiety   . Urinary frequency   . Hearing loss of both ears     wears hearing aides  . Enlarged prostate with lower urinary tract symptoms (LUTS)     sees Dr Patsi Sears    Past Surgical History  Procedure Date  . Total knee arthroplasty 1994    right  . Total shoulder replacement 1997    right  . Joint replacement     Rt knee and shoulder  . Hernia repair 1990    LIH repair  . Cholecystectomy 2004  . Inguinal hernia repair 05/09/2011    Procedure: HERNIA REPAIR INGUINAL ADULT;  Surgeon: Adolph Pollack, MD;  Location: [facility] OR;  Service: General;  Laterality: Right;    History reviewed. No pertinent family history.  Social History History  Substance Use Topics  . Smoking status: Former Smoker -- 0.5 packs/day for 35 years    Types: Cigarettes    Quit date: 05/02/1984  . Smokeless tobacco: Current User    Types: Chew  . Alcohol Use: No    Allergies  Allergen Reactions  . Amitriptyline   . Beta Adrenergic Blockers   . Niacin And Related   . Sulfa Antibiotics Itching and Rash    Current Outpatient Prescriptions  Medication Sig  Dispense Refill  . amiodarone (PACERONE) 200 MG tablet Take 100 mg by mouth every Monday, Wednesday, and Friday.       . Calcium Carbonate (CALCIUM 500 PO) Take 1 tablet by mouth daily.       . Cyanocobalamin (VITAMIN B-12 PO) Take 1,000 mcg by mouth daily.       . fenofibrate 160 MG tablet Take 160 mg by mouth daily.       . finasteride (PROSCAR) 5 MG tablet Take 5 mg by mouth daily.       Marland Kitchen HYDROcodone-acetaminophen (NORCO) 5-325 MG per tablet Ad lib.      Marland Kitchen levothyroxine (SYNTHROID, LEVOTHROID) 125 MCG tablet Take 125 mcg by mouth daily.       Marland Kitchen LORazepam (ATIVAN) 1 MG tablet Take 1 mg by mouth 2 (two) times daily.       . meclizine (ANTIVERT) 25 MG tablet Take 25 mg by mouth 3 (three) times daily as needed. For dizziness       . metoprolol tartrate (LOPRESSOR) 25 MG tablet Take 25 mg by  mouth 2 (two) times daily.       . mirtazapine (REMERON) 30 MG tablet Take 30 mg by mouth At bedtime.       . nitroGLYCERIN (NITROSTAT) 0.4 MG SL tablet Place 0.4 mg under the tongue every 5 (five) minutes as needed. For chest pain      . Omega-3 Fatty Acids (FISH OIL) 1000 MG CAPS Take 1,000 mg by mouth daily.       . pantoprazole (PROTONIX) 40 MG tablet Take 40 mg by mouth daily.       . polyethylene glycol (MIRALAX / GLYCOLAX) packet Take 17 g by mouth daily.       . pravastatin (PRAVACHOL) 40 MG tablet Take 80 mg by mouth daily.       . temazepam (RESTORIL) 15 MG capsule Take 15 mg by mouth At bedtime.       . traMADol-acetaminophen (ULTRACET) 37.5-325 MG per tablet Take 1 tablet by mouth every 6 (six) hours as needed. For pain      . warfarin (COUMADIN) 3 MG tablet Take 3 mg by mouth daily. Take 1/2 a tablet on sundays and wednesdays. Take 1 tablet the rest of the week        Review of Systems Review of Systems  Constitutional: Negative for activity change and appetite change.  Genitourinary:       Painful rash in right groin crease.    Blood pressure 122/70, pulse 72, temperature 98.4 F (36.9  C), temperature source Temporal, resp. rate 18, height 5\' 7"  (1.702 m), weight 158 lb (71.668 kg).  Physical Exam Physical Exam  Constitutional:       Elderly male in NAD  Genitourinary:       Right inguinal scar.  No right inguinal bulge.  Moist red rash in right groin crease with some skin breakdown.    Data Reviewed Old note  Assessment    Right groin rash, fungal in nature    Plan    Nystatin powder.  Keep area dry.  RTC prn.       Jeronica Stlouis J 08/08/2011, 12:13 PM

## 2011-08-08 NOTE — Patient Instructions (Signed)
Apply powder three times a day to rash.  Wear boxer shorts.  Keep area dry.

## 2011-09-04 ENCOUNTER — Other Ambulatory Visit: Payer: Self-pay | Admitting: Cardiology

## 2012-03-09 ENCOUNTER — Emergency Department (HOSPITAL_COMMUNITY): Payer: Medicare PPO

## 2012-03-09 ENCOUNTER — Encounter (HOSPITAL_COMMUNITY): Payer: Self-pay | Admitting: *Deleted

## 2012-03-09 ENCOUNTER — Other Ambulatory Visit: Payer: Self-pay

## 2012-03-09 ENCOUNTER — Inpatient Hospital Stay (HOSPITAL_COMMUNITY)
Admission: EM | Admit: 2012-03-09 | Discharge: 2012-03-12 | DRG: 726 | Disposition: A | Discharge status: Home / Self Care | Payer: Medicare PPO | Attending: Internal Medicine | Admitting: Internal Medicine

## 2012-03-09 DIAGNOSIS — R350 Frequency of micturition: Secondary | ICD-10-CM | POA: Diagnosis present

## 2012-03-09 DIAGNOSIS — R0789 Other chest pain: Secondary | ICD-10-CM | POA: Diagnosis present

## 2012-03-09 DIAGNOSIS — Z888 Allergy status to other drugs, medicaments and biological substances status: Secondary | ICD-10-CM

## 2012-03-09 DIAGNOSIS — E860 Dehydration: Secondary | ICD-10-CM

## 2012-03-09 DIAGNOSIS — F3289 Other specified depressive episodes: Secondary | ICD-10-CM | POA: Diagnosis present

## 2012-03-09 DIAGNOSIS — N3090 Cystitis, unspecified without hematuria: Secondary | ICD-10-CM | POA: Diagnosis present

## 2012-03-09 DIAGNOSIS — I4891 Unspecified atrial fibrillation: Secondary | ICD-10-CM

## 2012-03-09 DIAGNOSIS — E039 Hypothyroidism, unspecified: Secondary | ICD-10-CM

## 2012-03-09 DIAGNOSIS — K5909 Other constipation: Secondary | ICD-10-CM | POA: Diagnosis present

## 2012-03-09 DIAGNOSIS — N138 Other obstructive and reflux uropathy: Principal | ICD-10-CM | POA: Diagnosis present

## 2012-03-09 DIAGNOSIS — I10 Essential (primary) hypertension: Secondary | ICD-10-CM

## 2012-03-09 DIAGNOSIS — K219 Gastro-esophageal reflux disease without esophagitis: Secondary | ICD-10-CM | POA: Diagnosis present

## 2012-03-09 DIAGNOSIS — D696 Thrombocytopenia, unspecified: Secondary | ICD-10-CM | POA: Diagnosis present

## 2012-03-09 DIAGNOSIS — E785 Hyperlipidemia, unspecified: Secondary | ICD-10-CM

## 2012-03-09 DIAGNOSIS — F411 Generalized anxiety disorder: Secondary | ICD-10-CM | POA: Diagnosis present

## 2012-03-09 DIAGNOSIS — Z96659 Presence of unspecified artificial knee joint: Secondary | ICD-10-CM

## 2012-03-09 DIAGNOSIS — Y92009 Unspecified place in unspecified non-institutional (private) residence as the place of occurrence of the external cause: Secondary | ICD-10-CM

## 2012-03-09 DIAGNOSIS — N390 Urinary tract infection, site not specified: Secondary | ICD-10-CM

## 2012-03-09 DIAGNOSIS — M129 Arthropathy, unspecified: Secondary | ICD-10-CM | POA: Diagnosis present

## 2012-03-09 DIAGNOSIS — Z87891 Personal history of nicotine dependence: Secondary | ICD-10-CM

## 2012-03-09 DIAGNOSIS — F329 Major depressive disorder, single episode, unspecified: Secondary | ICD-10-CM | POA: Diagnosis present

## 2012-03-09 DIAGNOSIS — K7460 Unspecified cirrhosis of liver: Secondary | ICD-10-CM | POA: Diagnosis present

## 2012-03-09 DIAGNOSIS — R791 Abnormal coagulation profile: Secondary | ICD-10-CM

## 2012-03-09 DIAGNOSIS — D689 Coagulation defect, unspecified: Secondary | ICD-10-CM

## 2012-03-09 DIAGNOSIS — H9190 Unspecified hearing loss, unspecified ear: Secondary | ICD-10-CM | POA: Diagnosis present

## 2012-03-09 DIAGNOSIS — R319 Hematuria, unspecified: Secondary | ICD-10-CM

## 2012-03-09 DIAGNOSIS — Z8701 Personal history of pneumonia (recurrent): Secondary | ICD-10-CM

## 2012-03-09 DIAGNOSIS — N3091 Cystitis, unspecified with hematuria: Secondary | ICD-10-CM

## 2012-03-09 DIAGNOSIS — K5900 Constipation, unspecified: Secondary | ICD-10-CM | POA: Diagnosis present

## 2012-03-09 DIAGNOSIS — R161 Splenomegaly, not elsewhere classified: Secondary | ICD-10-CM | POA: Diagnosis present

## 2012-03-09 DIAGNOSIS — R21 Rash and other nonspecific skin eruption: Secondary | ICD-10-CM

## 2012-03-09 DIAGNOSIS — D6832 Hemorrhagic disorder due to extrinsic circulating anticoagulants: Secondary | ICD-10-CM

## 2012-03-09 DIAGNOSIS — Z882 Allergy status to sulfonamides status: Secondary | ICD-10-CM

## 2012-03-09 DIAGNOSIS — Z96619 Presence of unspecified artificial shoulder joint: Secondary | ICD-10-CM

## 2012-03-09 DIAGNOSIS — N400 Enlarged prostate without lower urinary tract symptoms: Secondary | ICD-10-CM

## 2012-03-09 DIAGNOSIS — N401 Enlarged prostate with lower urinary tract symptoms: Principal | ICD-10-CM | POA: Diagnosis present

## 2012-03-09 DIAGNOSIS — Z79899 Other long term (current) drug therapy: Secondary | ICD-10-CM

## 2012-03-09 DIAGNOSIS — Z23 Encounter for immunization: Secondary | ICD-10-CM

## 2012-03-09 DIAGNOSIS — Z9089 Acquired absence of other organs: Secondary | ICD-10-CM

## 2012-03-09 DIAGNOSIS — T45515A Adverse effect of anticoagulants, initial encounter: Secondary | ICD-10-CM | POA: Diagnosis present

## 2012-03-09 DIAGNOSIS — W19XXXA Unspecified fall, initial encounter: Secondary | ICD-10-CM | POA: Diagnosis present

## 2012-03-09 LAB — URINALYSIS, ROUTINE W REFLEX MICROSCOPIC
Nitrite: POSITIVE — AB
Specific Gravity, Urine: 1.029 (ref 1.005–1.030)
Urobilinogen, UA: 4 mg/dL — ABNORMAL HIGH (ref 0.0–1.0)
pH: 5 (ref 5.0–8.0)

## 2012-03-09 LAB — CBC WITH DIFFERENTIAL/PLATELET
Basophils Relative: 1 % (ref 0–1)
Eosinophils Absolute: 0.1 10*3/uL (ref 0.0–0.7)
Eosinophils Relative: 2 % (ref 0–5)
HCT: 34 % — ABNORMAL LOW (ref 39.0–52.0)
Hemoglobin: 11.4 g/dL — ABNORMAL LOW (ref 13.0–17.0)
Lymphs Abs: 1.2 10*3/uL (ref 0.7–4.0)
MCH: 30.5 pg (ref 26.0–34.0)
MCHC: 33.5 g/dL (ref 30.0–36.0)
MCV: 90.9 fL (ref 78.0–100.0)
Monocytes Absolute: 0.3 10*3/uL (ref 0.1–1.0)
Monocytes Relative: 6 % (ref 3–12)
RBC: 3.74 MIL/uL — ABNORMAL LOW (ref 4.22–5.81)

## 2012-03-09 LAB — LIPASE, BLOOD: Lipase: 40 U/L (ref 11–59)

## 2012-03-09 LAB — TYPE AND SCREEN

## 2012-03-09 LAB — COMPREHENSIVE METABOLIC PANEL
AST: 50 U/L — ABNORMAL HIGH (ref 0–37)
Albumin: 3.1 g/dL — ABNORMAL LOW (ref 3.5–5.2)
Alkaline Phosphatase: 111 U/L (ref 39–117)
BUN: 19 mg/dL (ref 6–23)
Chloride: 104 mEq/L (ref 96–112)
Creatinine, Ser: 0.85 mg/dL (ref 0.50–1.35)
Potassium: 3.9 mEq/L (ref 3.5–5.1)
Total Bilirubin: 0.7 mg/dL (ref 0.3–1.2)
Total Protein: 5.9 g/dL — ABNORMAL LOW (ref 6.0–8.3)

## 2012-03-09 LAB — TROPONIN I: Troponin I: <0.3 ng/mL (ref <0.30)

## 2012-03-09 LAB — URINE MICROSCOPIC-ADD ON

## 2012-03-09 LAB — PROTIME-INR: INR: 3.31 — ABNORMAL HIGH (ref 0.00–1.49)

## 2012-03-09 LAB — APTT: aPTT: 61 seconds — ABNORMAL HIGH (ref 24–37)

## 2012-03-09 LAB — OCCULT BLOOD, POC DEVICE: Fecal Occult Bld: NEGATIVE

## 2012-03-09 MED ORDER — ONDANSETRON HCL 4 MG/2ML IJ SOLN: 4.0000 mg | Freq: Four times a day (QID) | INTRAMUSCULAR | Status: DC | PRN

## 2012-03-09 MED ORDER — ALUM & MAG HYDROXIDE-SIMETH 200-200-20 MG/5ML PO SUSP
30.0000 mL | Freq: Four times a day (QID) | ORAL | Status: DC | PRN
  Filled 2012-03-09: qty 30

## 2012-03-09 MED ORDER — ONDANSETRON HCL 4 MG PO TABS: 4.0000 mg | ORAL_TABLET | Freq: Four times a day (QID) | ORAL | Status: DC | PRN

## 2012-03-09 MED ORDER — DEXTROSE 5 % IV SOLN
1.0000 g | INTRAVENOUS | Status: DC
  Administered 2012-03-10 – 2012-03-11 (×2): 1 g via INTRAVENOUS
  Filled 2012-03-09 (×3): qty 10

## 2012-03-09 MED ORDER — DEXTROSE 5 % IV SOLN
1.0000 g | Freq: Once | INTRAVENOUS | Status: AC
  Administered 2012-03-09: 1 g via INTRAVENOUS
  Filled 2012-03-09: qty 10

## 2012-03-09 MED ORDER — SODIUM CHLORIDE 0.9 % IV SOLN
1000.0000 mL | INTRAVENOUS | Status: DC
  Administered 2012-03-09 – 2012-03-12 (×9): 1000 mL via INTRAVENOUS

## 2012-03-09 MED ORDER — PANTOPRAZOLE SODIUM 40 MG IV SOLR
40.0000 mg | Freq: Once | INTRAVENOUS | Status: AC
  Administered 2012-03-09: 40 mg via INTRAVENOUS
  Filled 2012-03-09: qty 40

## 2012-03-09 MED ORDER — SODIUM CHLORIDE 0.9 % IV SOLN
INTRAVENOUS | Status: DC
  Administered 2012-03-10: 02:00:00 via INTRAVENOUS

## 2012-03-09 MED ORDER — SODIUM CHLORIDE 0.9 % IV SOLN
1000.0000 mL | Freq: Once | INTRAVENOUS | Status: AC
  Administered 2012-03-09: 1000 mL via INTRAVENOUS

## 2012-03-09 MED ORDER — ACETAMINOPHEN 650 MG RE SUPP: 650.0000 mg | Freq: Four times a day (QID) | RECTAL | Status: DC | PRN

## 2012-03-09 MED ORDER — HYDROMORPHONE HCL PF 1 MG/ML IJ SOLN: 0.5000 mg | INTRAMUSCULAR | Status: DC | PRN

## 2012-03-09 MED ORDER — ONDANSETRON HCL 4 MG/2ML IJ SOLN
4.0000 mg | Freq: Once | INTRAMUSCULAR | Status: AC
  Administered 2012-03-09: 4 mg via INTRAVENOUS
  Filled 2012-03-09: qty 2

## 2012-03-09 MED ORDER — ZOLPIDEM TARTRATE 5 MG PO TABS
5.0000 mg | ORAL_TABLET | Freq: Every evening | ORAL | Status: DC | PRN
  Administered 2012-03-10 – 2012-03-11 (×2): 5 mg via ORAL
  Filled 2012-03-09 (×2): qty 1

## 2012-03-09 MED ORDER — ACETAMINOPHEN 325 MG PO TABS
650.0000 mg | ORAL_TABLET | Freq: Four times a day (QID) | ORAL | Status: DC | PRN
  Administered 2012-03-10: 650 mg via ORAL
  Filled 2012-03-09: qty 2

## 2012-03-09 MED ORDER — OXYCODONE HCL 5 MG PO TABS: 5.0000 mg | ORAL_TABLET | ORAL | Status: DC | PRN

## 2012-03-09 NOTE — ED Notes (Addendum)
Pt in via EMS, per EMS- pt in stating that yesterday he noted blood in the toilet after using the bathroom, these symptoms resolved yesterday afternoon and then returned today. Pt states he has noted increased GI bleeding today, with history of prostate problems. Pt describes bleeding as bright red and having lower abd pain with bowel movement. Pt is taking coumadin.

## 2012-03-09 NOTE — ED Provider Notes (Signed)
Patient moved to CDU holding.  Sign out received from Dr Manus Gunning.  Pt with hematuria, on coumadin (INR 3.3), Hgb currently appearing stable, dropped from 12.4 to 11.4.  Plan is for CT abd/pelvis without contrast, concern for masses or other urinary tract issues.  Anticipate admission following CT, pending results and patient reassessment.    9:10 PM Patient is in CDU awaiting CT scan.  Patient and family report pt has had intermittent hematuria before but today pt states he has never bled so much before - states he "messed up" 2 toilets and 5 pairs of pajamas.  Also had frequency and "feeling stopped up"  (urinary hesitancy).  No needs at this time.  Pt is A&O, NAD, RRR, CTAB, abd soft, TTP throughout lower abdomen without guarding or rebound.    10:36 PM Discussed results with patient and family.  Patient and family verbalize understanding and agree with admission.  Reviewed all results with Dr Manus Gunning.  Pt with hematuria, UTI, supratherapeutic on coumadin, also dehydrated.  Rocephin started in ED.  Pt admitted to [facility].  Dr Lovell Sheehan accepts admission.  I have placed holding orders.  Results for orders placed during the hospital encounter of 03/09/12  CBC WITH DIFFERENTIAL      Component Value Range   WBC 4.6  4.0 - 10.5 K/uL   RBC 3.74 (*) 4.22 - 5.81 MIL/uL   Hemoglobin 11.4 (*) 13.0 - 17.0 g/dL   HCT 16.1 (*) 09.6 - 04.5 %   MCV 90.9  78.0 - 100.0 fL   MCH 30.5  26.0 - 34.0 pg   MCHC 33.5  30.0 - 36.0 g/dL   RDW 40.9  81.1 - 91.4 %   Platelets 126 (*) 150 - 400 K/uL   Neutrophils Relative 65  43 - 77 %   Neutro Abs 3.0  1.7 - 7.7 K/uL   Lymphocytes Relative 26  12 - 46 %   Lymphs Abs 1.2  0.7 - 4.0 K/uL   Monocytes Relative 6  3 - 12 %   Monocytes Absolute 0.3  0.1 - 1.0 K/uL   Eosinophils Relative 2  0 - 5 %   Eosinophils Absolute 0.1  0.0 - 0.7 K/uL   Basophils Relative 1  0 - 1 %   Basophils Absolute 0.0  0.0 - 0.1 K/uL  PROTIME-INR      Component Value Range   Prothrombin Time 31.8 (*) 11.6 - 15.2 seconds   INR 3.31 (*) 0.00 - 1.49  TYPE AND SCREEN      Component Value Range   ABO/RH(D) O POS     Antibody Screen NEG     Sample Expiration 03/12/2012    APTT      Component Value Range   aPTT 61 (*) 24 - 37 seconds  LIPASE, BLOOD      Component Value Range   Lipase 40  11 - 59 U/L  COMPREHENSIVE METABOLIC PANEL      Component Value Range   Sodium 138  135 - 145 mEq/L   Potassium 3.9  3.5 - 5.1 mEq/L   Chloride 104  96 - 112 mEq/L   CO2 26  19 - 32 mEq/L   Glucose, Bld 199 (*) 70 - 99 mg/dL   BUN 19  6 - 23 mg/dL   Creatinine, Ser 7.82  0.50 - 1.35 mg/dL   Calcium 9.2  8.4 - 95.6 mg/dL   Total Protein 5.9 (*) 6.0 - 8.3 g/dL   Albumin 3.1 (*)  3.5 - 5.2 g/dL   AST 50 (*) 0 - 37 U/L   ALT 22  0 - 53 U/L   Alkaline Phosphatase 111  39 - 117 U/L   Total Bilirubin 0.7  0.3 - 1.2 mg/dL   GFR calc non Af Amer 76 (*) >90 mL/min   GFR calc Af Amer 88 (*) >90 mL/min  TROPONIN I      Component Value Range   Troponin I <0.30  <0.30 ng/mL  OCCULT BLOOD, POC DEVICE      Component Value Range   Fecal Occult Bld NEGATIVE    URINALYSIS, ROUTINE W REFLEX MICROSCOPIC      Component Value Range   Color, Urine RED (*) YELLOW   APPearance TURBID (*) CLEAR   Specific Gravity, Urine 1.029  1.005 - 1.030   pH 5.0  5.0 - 8.0   Glucose, UA 250 (*) NEGATIVE mg/dL   Hgb urine dipstick LARGE (*) NEGATIVE   Bilirubin Urine LARGE (*) NEGATIVE   Ketones, ur >80 (*) NEGATIVE mg/dL   Protein, ur >161 (*) NEGATIVE mg/dL   Urobilinogen, UA 4.0 (*) 0.0 - 1.0 mg/dL   Nitrite POSITIVE (*) NEGATIVE   Leukocytes, UA LARGE (*) NEGATIVE  ABO/RH      Component Value Range   ABO/RH(D) O POS    URINE MICROSCOPIC-ADD ON      Component Value Range   Squamous Epithelial / LPF RARE  RARE   WBC, UA 21-50  <3 WBC/hpf   RBC / HPF TOO NUMEROUS TO COUNT  <3 RBC/hpf   Ct Abdomen Pelvis Wo Contrast  03/09/2012  *RADIOLOGY REPORT*  Clinical Data: GI bleed.  Hematuria.  CT  ABDOMEN AND PELVIS WITHOUT CONTRAST  Technique:  Multidetector CT imaging of the abdomen and pelvis was performed following the standard protocol without intravenous contrast.  Comparison: 08/06/2007.  Findings: Moderate amount of hemorrhage within the bladder. Etiology indeterminate.  Proximal small bowel loops with slightly thickened appearance. This may be related to under distension.  Inflammation not excluded.  Otherwise no extraluminal bowel inflammatory process, free fluid or free air is noted.  Cirrhotic appearance of the liver.  The spleen is elongated spanning over 12.5 cm.  Post cholecystectomy.  Low density lesions in the kidneys may be cyst although some too small to characterize.  Evaluation of solid abdominal viscera is limited by lack of IV contrast.  Taking this limitation into account no focal hepatic, worrisome splenic, pancreatic or adrenal lesion.  Scoliosis and degenerative changes lumbar spine.  Atherosclerotic type changes of the aorta with ectasia.  Ectatic iliac arteries.  Within the right inguinal hernia rounded structures noted which may represent fluid lymph nodes.  Coronary artery calcifications.  Scarring lung bases.  IMPRESSION:  Moderate amount of hemorrhage within the bladder.  Etiology indeterminate.  Proximal small bowel loops with slightly thickened appearance. This may be related to under distension.  Inflammation not excluded.  Otherwise no extraluminal bowel inflammatory process, free fluid or free air is noted.  Cirrhotic appearance of the liver.  The spleen is elongated spanning over 12.5 cm.   Original Report Authenticated By: Lacy Duverney, M.D.       [place], [place] 03/09/12 [phone]

## 2012-03-09 NOTE — H&P (Signed)
[facility] History and Physical  Raymond Nickolson Sr. ZOX:[id number] DOB: Jan 01, 1923 DOA: 03/09/2012  Referring physician: EDP PCP: Herb Grays, MD  Specialists:   Chief Complaint: Bloody Urine and Lower ABD Pain  HPI: Raymond Ogren Sr. is a 78 y.o. male who presents to the ED with complaints of bloody urine X 3 days and lower ABD Pain.  He reports passing large blood clots.  He denies having any back pain or fevers or chills.  He is on coumadin for atrial fibrillation.   In the ED he was found to have a +UA, and was placed on IV  Rocephin. His INR was found to be 3.3.     Review of Systems: The patient denies anorexia, fever, weight loss,, vision loss, decreased hearing, hoarseness, chest pain, syncope, dyspnea on exertion, peripheral edema, balance deficits, hemoptysis, abdominal pain, melena, hematochezia, severe indigestion/heartburn, hematuria, incontinence, genital sores, muscle weakness, suspicious skin lesions, transient blindness, difficulty walking, depression, unusual weight change, abnormal bleeding, enlarged lymph nodes, angioedema, and breast masses.    Past Medical History  Diagnosis Date  . Arthritis   . Hyperlipidemia   . Hypertension   . Dysrhythmia   . A-fib     Dr Donnie Aho; on Coumadin  . Angina     uses NTG prn; last episode 3 mo  . Pneumonia     12 /2012  . Recurrent upper respiratory infection (URI)     03/2011  . Hypothyroidism     x 10 yrs  . GERD (gastroesophageal reflux disease)   . Constipation, chronic   . Depression   . History of dizziness   . Anxiety   . Urinary frequency   . Hearing loss of both ears     wears hearing aides  . Enlarged prostate with lower urinary tract symptoms (LUTS)     sees Dr Patsi Sears   Past Surgical History  Procedure Date  . Total knee arthroplasty 1994    right  . Total shoulder replacement 1997    right  . Joint replacement     Rt knee and shoulder  . Hernia repair 1990    LIH repair  . Cholecystectomy 2004    . Inguinal hernia repair 05/09/2011    Procedure: HERNIA REPAIR INGUINAL ADULT;  Surgeon: Adolph Pollack, MD;  Location: [facility] OR;  Service: General;  Laterality: Right;    Medications:  HOME MEDS: Prior to Admission medications   Medication Sig Start Date End Date Taking? Authorizing Provider  amiodarone (PACERONE) 200 MG tablet Take 100 mg by mouth every Monday, Wednesday, and Friday.  03/11/11  Yes Historical Provider, MD  fenofibrate 160 MG tablet Take 160 mg by mouth daily.  03/20/11  Yes Historical Provider, MD  fish oil-omega-3 fatty acids 1000 MG capsule Take 1 g by mouth 2 (two) times daily.   Yes Historical Provider, MD  HYDROcodone-acetaminophen (NORCO) 5-325 MG per tablet Take 1 tablet by mouth every 6 (six) hours as needed. For pain 08/02/11  Yes Historical Provider, MD  levothyroxine (SYNTHROID, LEVOTHROID) 100 MCG tablet Take 100 mcg by mouth daily.   Yes Historical Provider, MD  meclizine (ANTIVERT) 25 MG tablet Take 25 mg by mouth 2 (two) times daily as needed. For dizziness    Yes Historical Provider, MD  metoprolol tartrate (LOPRESSOR) 25 MG tablet Take 25 mg by mouth 2 (two) times daily.  03/03/11  Yes Historical Provider, MD  mirtazapine (REMERON) 30 MG tablet Take 30 mg by mouth At bedtime.  01/04/11  Yes Historical Provider, MD  nitroGLYCERIN (NITROSTAT) 0.4 MG SL tablet Place 0.4 mg under the tongue every 5 (five) minutes as needed. For chest pain   Yes Historical Provider, MD  pantoprazole (PROTONIX) 40 MG tablet Take 40 mg by mouth daily.  01/24/11  Yes Historical Provider, MD  polyethylene glycol (MIRALAX / GLYCOLAX) packet Take 17 g by mouth 2 (two) times daily.    Yes Historical Provider, MD  pravastatin (PRAVACHOL) 40 MG tablet Take 80 mg by mouth daily.  02/07/11  Yes Historical Provider, MD  temazepam (RESTORIL) 15 MG capsule Take 15 mg by mouth at bedtime.  03/08/11  Yes Historical Provider, MD  warfarin (COUMADIN) 2.5 MG tablet Take 2.5 mg by mouth See admin  instructions. Takes daily except on Sat   Yes Historical Provider, MD    Allergies:  Allergies  Allergen Reactions  . Amitriptyline Other (See Comments)    Unknown reaction per family  . Beta Adrenergic Blockers Other (See Comments)    Unknown reaction per family  . Niacin And Related Other (See Comments)    Unknown reaction per family  . Sulfa Antibiotics Itching, Nausea And Vomiting and Rash     Social History:  reports that he quit smoking about 27 years ago. His smoking use included Cigarettes. He has a 17.5 pack-year smoking history. His smokeless tobacco use includes Chew. He reports that he does not drink alcohol or use illicit drugs.   Family History:            Physical Exam:  GEN:  Pleasant Thin 78 year old Elderly Caucasian Male examined  and in no acute distress; cooperative with exam Filed Vitals:   03/09/12 2015 03/09/12 2021 03/09/12 2022 03/09/12 2024  BP: 156/66 154/71 159/69 152/64  Pulse:  61 62 62  Temp:      TempSrc:      Resp: 16 18 18 18   SpO2: 99% 97% 97%    Blood pressure 152/64, pulse 62, temperature 98.9 F (37.2 C), temperature source Oral, resp. rate 18, SpO2 97.00%. PSYCH: He is alert and oriented x4; does not appear anxious does not appear depressed; affect is normal HEENT: Normocephalic and Atraumatic, Mucous membranes pink; PERRLA; EOM intact; Fundi:  Benign;  No scleral icterus, Nares: Patent, Oropharynx: Clear, Edentulous with an Upper Denture, Neck:  FROM, no cervical lymphadenopathy nor thyromegaly or carotid bruit; no JVD; Breasts:: Not examined CHEST WALL: No tenderness CHEST: Normal respiration, clear to auscultation bilaterally HEART: Regular rate and rhythm; no murmurs rubs or gallops BACK: No kyphosis or scoliosis; no CVA tenderness ABDOMEN: Positive Bowel Sounds, Scaphoid, soft non-tender; no masses, no organomegaly.   Rectal Exam: Not done EXTREMITIES: No bone or joint deformity; age-appropriate arthropathy of the hands and  knees; no cyanosis, clubbing or edema; no ulcerations. Genitalia: not examined PULSES: 2+ and symmetric SKIN: Normal hydration no rash or ulceration CNS: Cranial nerves 2-12 grossly intact no focal neurologic deficit    Labs on Admission:  Basic Metabolic Panel:  Lab 03/09/12 0454  NA 138  K 3.9  CL 104  CO2 26  GLUCOSE 199*  BUN 19  CREATININE 0.85  CALCIUM 9.2  MG --  PHOS --   Liver Function Tests:  Lab 03/09/12 1822  AST 50*  ALT 22  ALKPHOS 111  BILITOT 0.7  PROT 5.9*  ALBUMIN 3.1*    Lab 03/09/12 1822  LIPASE 40  AMYLASE --   No results found for this basename: AMMONIA:5 in the last 168  hours CBC:  Lab 03/09/12 1822  WBC 4.6  NEUTROABS 3.0  HGB 11.4*  HCT 34.0*  MCV 90.9  PLT 126*   Cardiac Enzymes:  Lab 03/09/12 1826  CKTOTAL --  CKMB --  CKMBINDEX --  TROPONINI <0.30    BNP (last 3 results) No results found for this basename: PROBNP:3 in the last 8760 hours CBG: No results found for this basename: GLUCAP:5 in the last 168 hours  Radiological Exams on Admission: Ct Abdomen Pelvis Wo Contrast  03/09/2012  *RADIOLOGY REPORT*  Clinical Data: GI bleed.  Hematuria.  CT ABDOMEN AND PELVIS WITHOUT CONTRAST  Technique:  Multidetector CT imaging of the abdomen and pelvis was performed following the standard protocol without intravenous contrast.  Comparison: 08/06/2007.  Findings: Moderate amount of hemorrhage within the bladder. Etiology indeterminate.  Proximal small bowel loops with slightly thickened appearance. This may be related to under distension.  Inflammation not excluded.  Otherwise no extraluminal bowel inflammatory process, free fluid or free air is noted.  Cirrhotic appearance of the liver.  The spleen is elongated spanning over 12.5 cm.  Post cholecystectomy.  Low density lesions in the kidneys may be cyst although some too small to characterize.  Evaluation of solid abdominal viscera is limited by lack of IV contrast.  Taking this  limitation into account no focal hepatic, worrisome splenic, pancreatic or adrenal lesion.  Scoliosis and degenerative changes lumbar spine.  Atherosclerotic type changes of the aorta with ectasia.  Ectatic iliac arteries.  Within the right inguinal hernia rounded structures noted which may represent fluid lymph nodes.  Coronary artery calcifications.  Scarring lung bases.  IMPRESSION:  Moderate amount of hemorrhage within the bladder.  Etiology indeterminate.  Proximal small bowel loops with slightly thickened appearance. This may be related to under distension.  Inflammation not excluded.  Otherwise no extraluminal bowel inflammatory process, free fluid or free air is noted.  Cirrhotic appearance of the liver.  The spleen is elongated spanning over 12.5 cm.   Original Report Authenticated By: Lacy Duverney, M.D.     EKG: Independently reviewed.   Assessment: Principal Problem:  *Hematuria Active Problems:  Hemorrhagic cystitis  UTI (lower urinary tract infection)  Warfarin-induced coagulopathy  Atrial fibrillation  Hypertension  Hypothyroid  BPH (benign prostatic hyperplasia)  Hyperlipidemia    Plan: Admit to Med/Surg Bed Urine for C+S IV Rocephin IVFs Reconcile Home Medications Adjust Coumadin per Pharmacy    Code Status: FULL CODE Family Communication: Family at bedside Disposition Plan: Return to Home   Time spent: 57 Minutes   Ron Parker [facility] Pager [phone]  If 7PM-7AM, please contact night-coverage [email or web address] Password [email or web address] 03/09/2012, 11:40 PM

## 2012-03-09 NOTE — ED Provider Notes (Signed)
History     CSN: [id number]  Arrival date & time 03/09/12  1758   First MD Initiated Contact with Patient 03/09/12 1802      Chief Complaint  Patient presents with  . GI Bleeding    (Consider location/radiation/quality/duration/timing/severity/associated sxs/prior treatment) HPI Comments: Patient arrives via EMS with history of bleeding after using the bathroom today. He noticed bright red blood with clots starting yesterday and again today. Initially he said it is coming from his rectum further clarification his family states is coming from his penis. Patient is on Coumadin for history of atrial fibrillation. He denies any history of GI bleed. He said intermittent bleeding problems in the penis in the past but never had it evaluated. Denies any chest pain, shortness of breath. Does endorse and lightheadedness and dizziness. No fevers, chills, nausea or vomiting.  The history is provided by the patient, the EMS personnel and a relative. The history is limited by the condition of the patient.    Past Medical History  Diagnosis Date  . Arthritis   . Hyperlipidemia   . Hypertension   . Dysrhythmia   . A-fib     Dr Donnie Aho; on Coumadin  . Angina     uses NTG prn; last episode 3 mo  . Pneumonia     12 /2012  . Recurrent upper respiratory infection (URI)     03/2011  . Hypothyroidism     x 10 yrs  . GERD (gastroesophageal reflux disease)   . Constipation, chronic   . Depression   . History of dizziness   . Anxiety   . Urinary frequency   . Hearing loss of both ears     wears hearing aides  . Enlarged prostate with lower urinary tract symptoms (LUTS)     sees Dr Patsi Sears    Past Surgical History  Procedure Date  . Total knee arthroplasty 1994    right  . Total shoulder replacement 1997    right  . Joint replacement     Rt knee and shoulder  . Hernia repair 1990    LIH repair  . Cholecystectomy 2004  . Inguinal hernia repair 05/09/2011    Procedure: HERNIA REPAIR  INGUINAL ADULT;  Surgeon: Adolph Pollack, MD;  Location: [facility] OR;  Service: General;  Laterality: Right;    History reviewed. No pertinent family history.  History  Substance Use Topics  . Smoking status: Former Smoker -- 0.5 packs/day for 35 years    Types: Cigarettes    Quit date: 05/02/1984  . Smokeless tobacco: Current User    Types: Chew  . Alcohol Use: No      Review of Systems  Constitutional: Positive for activity change and appetite change. Negative for fever.  HENT: Negative for congestion and rhinorrhea.   Respiratory: Negative for cough and shortness of breath.   Cardiovascular: Negative for chest pain.  Gastrointestinal: Positive for abdominal pain. Negative for nausea and vomiting.  Genitourinary: Positive for dysuria, hematuria, flank pain and discharge.  Skin: Negative for rash.  Neurological: Positive for dizziness, weakness and light-headedness.    Allergies  Amitriptyline; Beta adrenergic blockers; Niacin and related; and Sulfa antibiotics  Home Medications   Current Outpatient Rx  Name  Route  Sig  Dispense  Refill  . AMIODARONE HCL 200 MG PO TABS   Oral   Take 100 mg by mouth every Monday, Wednesday, and Friday.          . FENOFIBRATE 160 MG PO TABS  Oral   Take 160 mg by mouth daily.          . OMEGA-3 FATTY ACIDS 1000 MG PO CAPS   Oral   Take 1 g by mouth 2 (two) times daily.         Marland Kitchen HYDROCODONE-ACETAMINOPHEN 5-325 MG PO TABS   Oral   Take 1 tablet by mouth every 6 (six) hours as needed. For pain         . LEVOTHYROXINE SODIUM 100 MCG PO TABS   Oral   Take 100 mcg by mouth daily.         Marland Kitchen MECLIZINE HCL 25 MG PO TABS   Oral   Take 25 mg by mouth 2 (two) times daily as needed. For dizziness          . METOPROLOL TARTRATE 25 MG PO TABS   Oral   Take 25 mg by mouth 2 (two) times daily.          Marland Kitchen MIRTAZAPINE 30 MG PO TABS   Oral   Take 30 mg by mouth At bedtime.          Marland Kitchen NITROGLYCERIN 0.4 MG SL SUBL    Sublingual   Place 0.4 mg under the tongue every 5 (five) minutes as needed. For chest pain         . PANTOPRAZOLE SODIUM 40 MG PO TBEC   Oral   Take 40 mg by mouth daily.          Marland Kitchen POLYETHYLENE GLYCOL 3350 PO PACK   Oral   Take 17 g by mouth 2 (two) times daily.          Marland Kitchen PRAVASTATIN SODIUM 40 MG PO TABS   Oral   Take 80 mg by mouth daily.          Marland Kitchen TEMAZEPAM 15 MG PO CAPS   Oral   Take 15 mg by mouth at bedtime.          . WARFARIN SODIUM 2.5 MG PO TABS   Oral   Take 2.5 mg by mouth See admin instructions. Takes daily except on Sat           BP 152/64  Pulse 62  Temp 98.9 F (37.2 C) (Oral)  Resp 18  SpO2 97%  Physical Exam  Constitutional: He is oriented to person, place, and time. He appears well-developed and well-nourished. No distress.  HENT:  Head: Normocephalic and atraumatic.  Mouth/Throat: Oropharynx is clear and moist. No oropharyngeal exudate.  Eyes: Conjunctivae normal are normal. Pupils are equal, round, and reactive to light.  Neck: Normal range of motion. Neck supple.  Cardiovascular: Normal rate, regular rhythm and normal heart sounds.   No murmur heard. Pulmonary/Chest: Effort normal and breath sounds normal. No respiratory distress.  Abdominal: Soft. There is no tenderness. There is no rebound and no guarding.  Genitourinary:       Gross blood at urethral meatus. Testicles nontender Normal rectal tone with greenish brown stool, no gross blood in  Musculoskeletal: Normal range of motion. He exhibits no edema and no tenderness.  Neurological: He is alert and oriented to person, place, and time. No cranial nerve deficit. He exhibits normal muscle tone. Coordination normal.  Skin: Skin is warm. There is pallor.    ED Course  Procedures (including critical care time)  Labs Reviewed  CBC WITH DIFFERENTIAL - Abnormal; Notable for the following:    RBC 3.74 (*)     Hemoglobin 11.4 (*)  HCT 34.0 (*)     Platelets 126 (*)     All  other components within normal limits  PROTIME-INR - Abnormal; Notable for the following:    Prothrombin Time 31.8 (*)     INR 3.31 (*)     All other components within normal limits  APTT - Abnormal; Notable for the following:    aPTT 61 (*)     All other components within normal limits  COMPREHENSIVE METABOLIC PANEL - Abnormal; Notable for the following:    Glucose, Bld 199 (*)     Total Protein 5.9 (*)     Albumin 3.1 (*)     AST 50 (*)     GFR calc non Af Amer 76 (*)     GFR calc Af Amer 88 (*)     All other components within normal limits  TYPE AND SCREEN  LIPASE, BLOOD  TROPONIN I  OCCULT BLOOD, POC DEVICE  URINALYSIS, ROUTINE W REFLEX MICROSCOPIC   No results found.   No diagnosis found.    MDM  Patient is a poor historian presents with 2 day history of bright red blood in the toilet of probable penile source. Initially he stated it is coming from his rectum. Family states is coming from his penis. Denies any abdominal pain, nausea or vomiting. Does endorse lightheadedness and dizziness with standing. He is on Coumadin for history of atrial fibrillation.   Hemoglobin 11.4 from 12.5. Hemoccult is negative. Abdomen is soft and benign.  awaiting urine sample and CT noncontrast to evaluate for sources of hematuria.   Date: 03/09/2012  Rate: 61  Rhythm: normal sinus rhythm  QRS Axis: left  Intervals: PR prolonged  ST/T Wave abnormalities: normal  Conduction Disutrbances:first-degree A-V block   Narrative Interpretation:   Old EKG Reviewed: unchanged       Glynn Octave, MD 03/09/12 2032

## 2012-03-09 NOTE — ED Notes (Signed)
Patient transported to CT 

## 2012-03-09 NOTE — ED Notes (Signed)
Pt is back in room from CT

## 2012-03-10 ENCOUNTER — Encounter (HOSPITAL_COMMUNITY): Payer: Self-pay

## 2012-03-10 DIAGNOSIS — R791 Abnormal coagulation profile: Secondary | ICD-10-CM

## 2012-03-10 LAB — BASIC METABOLIC PANEL
BUN: 17 mg/dL (ref 6–23)
CO2: 26 mEq/L (ref 19–32)
Chloride: 106 mEq/L (ref 96–112)
GFR calc non Af Amer: 78 mL/min — ABNORMAL LOW (ref >90)
Glucose, Bld: 80 mg/dL (ref 70–99)
Potassium: 3.7 mEq/L (ref 3.5–5.1)

## 2012-03-10 LAB — CBC
HCT: 31.4 % — ABNORMAL LOW (ref 39.0–52.0)
Hemoglobin: 10.8 g/dL — ABNORMAL LOW (ref 13.0–17.0)
MCH: 31.4 pg (ref 26.0–34.0)
MCHC: 34.4 g/dL (ref 30.0–36.0)
MCV: 91.3 fL (ref 78.0–100.0)
RBC: 3.44 MIL/uL — ABNORMAL LOW (ref 4.22–5.81)

## 2012-03-10 MED ORDER — METOPROLOL TARTRATE 25 MG PO TABS
25.0000 mg | ORAL_TABLET | Freq: Two times a day (BID) | ORAL | Status: DC
  Administered 2012-03-10 – 2012-03-12 (×5): 25 mg via ORAL
  Filled 2012-03-10 (×8): qty 1

## 2012-03-10 MED ORDER — LEVOTHYROXINE SODIUM 100 MCG PO TABS
100.0000 ug | ORAL_TABLET | Freq: Every day | ORAL | Status: DC
  Administered 2012-03-10 – 2012-03-12 (×3): 100 ug via ORAL
  Filled 2012-03-10 (×5): qty 1

## 2012-03-10 MED ORDER — BIOTENE DRY MOUTH MT LIQD
15.0000 mL | Freq: Two times a day (BID) | OROMUCOSAL | Status: DC
  Administered 2012-03-10 – 2012-03-12 (×4): 15 mL via OROMUCOSAL

## 2012-03-10 MED ORDER — SODIUM CHLORIDE 0.9 % IV SOLN: INTRAVENOUS | Status: DC

## 2012-03-10 MED ORDER — SIMVASTATIN 20 MG PO TABS
20.0000 mg | ORAL_TABLET | Freq: Every day | ORAL | Status: DC
  Administered 2012-03-10 – 2012-03-11 (×2): 20 mg via ORAL
  Filled 2012-03-10 (×3): qty 1

## 2012-03-10 MED ORDER — MIRTAZAPINE 30 MG PO TABS
30.0000 mg | ORAL_TABLET | Freq: Every day | ORAL | Status: DC
  Administered 2012-03-10 – 2012-03-11 (×2): 30 mg via ORAL
  Filled 2012-03-10 (×3): qty 1

## 2012-03-10 MED ORDER — AMIODARONE HCL 100 MG PO TABS
100.0000 mg | ORAL_TABLET | ORAL | Status: DC
  Administered 2012-03-11: 100 mg via ORAL
  Filled 2012-03-10: qty 1

## 2012-03-10 MED ORDER — PNEUMOCOCCAL VAC POLYVALENT 25 MCG/0.5ML IJ INJ
0.5000 mL | INJECTION | INTRAMUSCULAR | Status: AC
  Administered 2012-03-11: 0.5 mL via INTRAMUSCULAR
  Filled 2012-03-10: qty 0.5

## 2012-03-10 NOTE — Consult Note (Signed)
Urology Consult   Physician requesting consult: Dr. Arbutus Leas  Reason for consult: Hematuria  History of Present Illness: Raymond Larsen. is a 78 y.o. followed by Dr. Patsi Sears for BPH.  He had previously been on finasteride but does not believe he is taking this medication anymore.  He has noted some progressive worsening of his LUTS since stopping finasteride.  He developed painless gross hematuria 3 days ago with acute difficulty with bladder emptying and therefore presented to the ED for evaluation.  He is on chronic anticoagulation with Coumdin for atrial fibrillation.  His INR was above 3 upon admission. He had a condom catheter placed without attempts at indwelling catheter placement.  He denies dysuria, abdominal/flank pain, or fever.    Past Medical History  Diagnosis Date  . Arthritis   . Hyperlipidemia   . Hypertension   . Dysrhythmia   . A-fib     Dr Donnie Aho; on Coumadin  . Angina     uses NTG prn; last episode 3 mo  . Pneumonia     12 /2012  . Recurrent upper respiratory infection (URI)     03/2011  . Hypothyroidism     x 10 yrs  . GERD (gastroesophageal reflux disease)   . Constipation, chronic   . Depression   . History of dizziness   . Anxiety   . Urinary frequency   . Hearing loss of both ears     wears hearing aides  . Enlarged prostate with lower urinary tract symptoms (LUTS)     sees Dr Patsi Sears    Past Surgical History  Procedure Date  . Total knee arthroplasty 1994    right  . Total shoulder replacement 1997    right  . Joint replacement     Rt knee and shoulder  . Hernia repair 1990    LIH repair  . Cholecystectomy 2004  . Inguinal hernia repair 05/09/2011    Procedure: HERNIA REPAIR INGUINAL ADULT;  Surgeon: Adolph Pollack, MD;  Location: [facility] OR;  Service: General;  Laterality: Right;    Current Hospital Medications:  Home Meds: @Hmed @  Scheduled Meds:   . [COMPLETED] sodium chloride  1,000 mL Intravenous Once  . amiodarone  100 mg  Oral Q M,W,F  . antiseptic oral rinse  15 mL Mouth Rinse BID  . [COMPLETED] cefTRIAXone (ROCEPHIN)  IV  1 g Intravenous Once  . cefTRIAXone (ROCEPHIN)  IV  1 g Intravenous Q24H  . levothyroxine  100 mcg Oral QAC breakfast  . metoprolol tartrate  25 mg Oral BID  . mirtazapine  30 mg Oral QHS  . [COMPLETED] ondansetron (ZOFRAN) IV  4 mg Intravenous Once  . [COMPLETED] pantoprazole (PROTONIX) IV  40 mg Intravenous Once  . pneumococcal 23 valent vaccine  0.5 mL Intramuscular Tomorrow-1000  . simvastatin  20 mg Oral q1800  . [DISCONTINUED] sodium chloride   Intravenous STAT   Continuous Infusions:   . sodium chloride 1,000 mL (03/10/12 1056)  . [DISCONTINUED] sodium chloride 75 mL/hr at 03/10/12 0214   PRN Meds:.acetaminophen, acetaminophen, alum & mag hydroxide-simeth, HYDROmorphone (DILAUDID) injection, ondansetron (ZOFRAN) IV, ondansetron, oxyCODONE, zolpidem  Allergies:  Allergies  Allergen Reactions  . Amitriptyline Other (See Comments)    Unknown reaction per family  . Beta Adrenergic Blockers Other (See Comments)    Unknown reaction per family  . Niacin And Related Other (See Comments)    Unknown reaction per family  . Sulfa Antibiotics Itching, Nausea And Vomiting and Rash  History reviewed. No pertinent family history.  Social History:  reports that he quit smoking about 27 years ago. His smoking use included Cigarettes. He has a 17.5 pack-year smoking history. His smokeless tobacco use includes Chew. He reports that he does not drink alcohol or use illicit drugs.  ROS: A complete review of systems was performed.  All systems are negative except for pertinent findings as noted.  Physical Exam:  Vital signs in last 24 hours: Temp:  [97.4 F (36.3 C)-98.9 F (37.2 C)] 97.4 F (36.3 C) (11/17 0750) Pulse Rate:  [58-74] 67  (11/17 0750) Resp:  [15-20] 20  (11/17 0750) BP: (134-162)/(55-83) 150/70 mmHg (11/17 0750) SpO2:  [95 %-99 %] 95 % (11/17 0750) Weight:  [66.1  kg (145 lb 11.6 oz)] 66.1 kg (145 lb 11.6 oz) (11/17 0139) General:  Alert and oriented, No acute distress HEENT: Normocephalic, atraumatic Neck: No JVD or lymphadenopathy Cardiovascular: Irregular Lungs: Clear bilaterally Abdomen: Soft, nontender, nondistended, no abdominal masses Back: No CVA tenderness GU: He has a condom catheter with dark red urine in the bag. Extremities: No edema Neurologic: Grossly intact  Laboratory Data:   [facility] 03/10/12 0450 03/09/12 1822  WBC 3.8* 4.6  HGB 10.8* 11.4*  HCT 31.4* 34.0*  PLT 112* 126*     Basename 03/10/12 0450 03/09/12 1822  NA 138 138  K 3.7 3.9  CL 106 104  GLUCOSE 80 199*  BUN 17 19  CALCIUM 8.5 9.2  CREATININE 0.79 0.85     Results for orders placed during the hospital encounter of 03/09/12 (from the past 24 hour(s))  CBC WITH DIFFERENTIAL     Status: Abnormal   Collection Time   03/09/12  6:22 PM      Component Value Range   WBC 4.6  4.0 - 10.5 K/uL   RBC 3.74 (*) 4.22 - 5.81 MIL/uL   Hemoglobin 11.4 (*) 13.0 - 17.0 g/dL   HCT 16.1 (*) 09.6 - 04.5 %   MCV 90.9  78.0 - 100.0 fL   MCH 30.5  26.0 - 34.0 pg   MCHC 33.5  30.0 - 36.0 g/dL   RDW 40.9  81.1 - 91.4 %   Platelets 126 (*) 150 - 400 K/uL   Neutrophils Relative 65  43 - 77 %   Neutro Abs 3.0  1.7 - 7.7 K/uL   Lymphocytes Relative 26  12 - 46 %   Lymphs Abs 1.2  0.7 - 4.0 K/uL   Monocytes Relative 6  3 - 12 %   Monocytes Absolute 0.3  0.1 - 1.0 K/uL   Eosinophils Relative 2  0 - 5 %   Eosinophils Absolute 0.1  0.0 - 0.7 K/uL   Basophils Relative 1  0 - 1 %   Basophils Absolute 0.0  0.0 - 0.1 K/uL  PROTIME-INR     Status: Abnormal   Collection Time   03/09/12  6:22 PM      Component Value Range   Prothrombin Time 31.8 (*) 11.6 - 15.2 seconds   INR 3.31 (*) 0.00 - 1.49  APTT     Status: Abnormal   Collection Time   03/09/12  6:22 PM      Component Value Range   aPTT 61 (*) 24 - 37 seconds  LIPASE, BLOOD     Status: Normal   Collection Time    03/09/12  6:22 PM      Component Value Range   Lipase 40  11 - 59 U/L  COMPREHENSIVE METABOLIC PANEL     Status: Abnormal   Collection Time   03/09/12  6:22 PM      Component Value Range   Sodium 138  135 - 145 mEq/L   Potassium 3.9  3.5 - 5.1 mEq/L   Chloride 104  96 - 112 mEq/L   CO2 26  19 - 32 mEq/L   Glucose, Bld 199 (*) 70 - 99 mg/dL   BUN 19  6 - 23 mg/dL   Creatinine, Ser 0.45  0.50 - 1.35 mg/dL   Calcium 9.2  8.4 - 40.9 mg/dL   Total Protein 5.9 (*) 6.0 - 8.3 g/dL   Albumin 3.1 (*) 3.5 - 5.2 g/dL   AST 50 (*) 0 - 37 U/L   ALT 22  0 - 53 U/L   Alkaline Phosphatase 111  39 - 117 U/L   Total Bilirubin 0.7  0.3 - 1.2 mg/dL   GFR calc non Af Amer 76 (*) >90 mL/min   GFR calc Af Amer 88 (*) >90 mL/min  TYPE AND SCREEN     Status: Normal   Collection Time   03/09/12  6:25 PM      Component Value Range   ABO/RH(D) O POS     Antibody Screen NEG     Sample Expiration 03/12/2012    ABO/RH     Status: Normal   Collection Time   03/09/12  6:25 PM      Component Value Range   ABO/RH(D) O POS    TROPONIN I     Status: Normal   Collection Time   03/09/12  6:26 PM      Component Value Range   Troponin I <0.30  <0.30 ng/mL  OCCULT BLOOD, POC DEVICE     Status: Normal   Collection Time   03/09/12  6:26 PM      Component Value Range   Fecal Occult Bld NEGATIVE    URINALYSIS, ROUTINE W REFLEX MICROSCOPIC     Status: Abnormal   Collection Time   03/09/12  8:26 PM      Component Value Range   Color, Urine RED (*) YELLOW   APPearance TURBID (*) CLEAR   Specific Gravity, Urine 1.029  1.005 - 1.030   pH 5.0  5.0 - 8.0   Glucose, UA 250 (*) NEGATIVE mg/dL   Hgb urine dipstick LARGE (*) NEGATIVE   Bilirubin Urine LARGE (*) NEGATIVE   Ketones, ur >80 (*) NEGATIVE mg/dL   Protein, ur >811 (*) NEGATIVE mg/dL   Urobilinogen, UA 4.0 (*) 0.0 - 1.0 mg/dL   Nitrite POSITIVE (*) NEGATIVE   Leukocytes, UA LARGE (*) NEGATIVE  URINE MICROSCOPIC-ADD ON     Status: Normal   Collection  Time   03/09/12  8:26 PM      Component Value Range   Squamous Epithelial / LPF RARE  RARE   WBC, UA 21-50  <3 WBC/hpf   RBC / HPF TOO NUMEROUS TO COUNT  <3 RBC/hpf  BASIC METABOLIC PANEL     Status: Abnormal   Collection Time   03/10/12  4:50 AM      Component Value Range   Sodium 138  135 - 145 mEq/L   Potassium 3.7  3.5 - 5.1 mEq/L   Chloride 106  96 - 112 mEq/L   CO2 26  19 - 32 mEq/L   Glucose, Bld 80  70 - 99 mg/dL   BUN 17  6 - 23 mg/dL   Creatinine,  Ser 0.79  0.50 - 1.35 mg/dL   Calcium 8.5  8.4 - 16.1 mg/dL   GFR calc non Af Amer 78 (*) >90 mL/min   GFR calc Af Amer >90  >90 mL/min  CBC     Status: Abnormal   Collection Time   03/10/12  4:50 AM      Component Value Range   WBC 3.8 (*) 4.0 - 10.5 K/uL   RBC 3.44 (*) 4.22 - 5.81 MIL/uL   Hemoglobin 10.8 (*) 13.0 - 17.0 g/dL   HCT 09.6 (*) 04.5 - 40.9 %   MCV 91.3  78.0 - 100.0 fL   MCH 31.4  26.0 - 34.0 pg   MCHC 34.4  30.0 - 36.0 g/dL   RDW 81.1  91.4 - 78.2 %   Platelets 112 (*) 150 - 400 K/uL   No results found for this or any previous visit (from the past 240 hour(s)).  Renal Function:  Basename 03/10/12 0450 03/09/12 1822  CREATININE 0.79 0.85   Estimated Creatinine Clearance: 60.8 ml/min (by C-G formula based on Cr of 0.79).  Radiologic Imaging: Ct Abdomen Pelvis Wo Contrast  03/09/2012  *RADIOLOGY REPORT*  Clinical Data: GI bleed.  Hematuria.  CT ABDOMEN AND PELVIS WITHOUT CONTRAST  Technique:  Multidetector CT imaging of the abdomen and pelvis was performed following the standard protocol without intravenous contrast.  Comparison: 08/06/2007.  Findings: Moderate amount of hemorrhage within the bladder. Etiology indeterminate.  Proximal small bowel loops with slightly thickened appearance. This may be related to under distension.  Inflammation not excluded.  Otherwise no extraluminal bowel inflammatory process, free fluid or free air is noted.  Cirrhotic appearance of the liver.  The spleen is elongated  spanning over 12.5 cm.  Post cholecystectomy.  Low density lesions in the kidneys may be cyst although some too small to characterize.  Evaluation of solid abdominal viscera is limited by lack of IV contrast.  Taking this limitation into account no focal hepatic, worrisome splenic, pancreatic or adrenal lesion.  Scoliosis and degenerative changes lumbar spine.  Atherosclerotic type changes of the aorta with ectasia.  Ectatic iliac arteries.  Within the right inguinal hernia rounded structures noted which may represent fluid lymph nodes.  Coronary artery calcifications.  Scarring lung bases.  IMPRESSION:  Moderate amount of hemorrhage within the bladder.  Etiology indeterminate.  Proximal small bowel loops with slightly thickened appearance. This may be related to under distension.  Inflammation not excluded.  Otherwise no extraluminal bowel inflammatory process, free fluid or free air is noted.  Cirrhotic appearance of the liver.  The spleen is elongated spanning over 12.5 cm.   Original Report Authenticated By: Lacy Duverney, M.D.     I independently reviewed the above imaging studies.  Procedure: I placed an 18 Fr coude catheter in the usual sterile fashion and without resistance.  I then hand irrigated the catheter with a large amount of old clots removed from the bladder with subsequent clearing of the urine.  His catheter was placed to straight drainage.  Impression/Assessment:  Gross hematuria  Plan:  Continue catheter today.  I will notify Dr. Patsi Sears of patient's admission for follow up during his hospitalization.  I think he can continue anticoagulation at a therapeutic level considering that blood appears old without ongoing hematuria.  He will need further outpatient urologic evaluation.  Danner Paulding,LES 03/10/2012, 12:30 PM    Moody Bruins MD

## 2012-03-10 NOTE — Progress Notes (Signed)
Patient arrived on unit via wheelchair from ED, family at bedside. Patient oriented to room. Vitals BP 162/83 HR 74, temp 98.1, R 18, 95% on room air. Steele Berg RN

## 2012-03-10 NOTE — Progress Notes (Signed)
[facility] PROGRESS NOTE  Raymond Mark Sr. WUJ:[id number] DOB: [birth year] DOA: 03/09/2012 PCP: Herb Grays, MD  Assessment/Plan: Acute hematuria -Started spontaneously on Thursday -Suspect hemorrhagic cystitis -Patient continues to have dysuria and suprapubic pain -Urine culture was not done, question yield at this point now patient is on antibiotics -Consult urology, spoke with Dr. Laverle Patter Supratherapeutic INR -INR 3.31 at the time of admission -Hold warfarin -Hemoglobin remained stable, hemodynamically stable -Will eventually need to discuss with family stopping Coumadin altogether as patient has been having falls Thrombocytopenia -This has been chronic -Patient has splenomegaly, cirrhotic appearing liver on CT -May partly be due to Coumadin -Check B12 level Atrial fibrillation -Currently in sinus -Continue amiodarone, metoprolol Hypothyroidism -Continue Synthroid      Disposition Plan:   Home when medically stable    Antibiotics:  Ceftriaxone November 16>>>    Procedures/Studies: Ct Abdomen Pelvis Wo Contrast  03/09/2012  *RADIOLOGY REPORT*  Clinical Data: GI bleed.  Hematuria.  CT ABDOMEN AND PELVIS WITHOUT CONTRAST  Technique:  Multidetector CT imaging of the abdomen and pelvis was performed following the standard protocol without intravenous contrast.  Comparison: 08/06/2007.  Findings: Moderate amount of hemorrhage within the bladder. Etiology indeterminate.  Proximal small bowel loops with slightly thickened appearance. This may be related to under distension.  Inflammation not excluded.  Otherwise no extraluminal bowel inflammatory process, free fluid or free air is noted.  Cirrhotic appearance of the liver.  The spleen is elongated spanning over 12.5 cm.  Post cholecystectomy.  Low density lesions in the kidneys may be cyst although some too small to characterize.  Evaluation of solid abdominal viscera is limited by lack of IV contrast.  Taking this  limitation into account no focal hepatic, worrisome splenic, pancreatic or adrenal lesion.  Scoliosis and degenerative changes lumbar spine.  Atherosclerotic type changes of the aorta with ectasia.  Ectatic iliac arteries.  Within the right inguinal hernia rounded structures noted which may represent fluid lymph nodes.  Coronary artery calcifications.  Scarring lung bases.  IMPRESSION:  Moderate amount of hemorrhage within the bladder.  Etiology indeterminate.  Proximal small bowel loops with slightly thickened appearance. This may be related to under distension.  Inflammation not excluded.  Otherwise no extraluminal bowel inflammatory process, free fluid or free air is noted.  Cirrhotic appearance of the liver.  The spleen is elongated spanning over 12.5 cm.   Original Report Authenticated By: Lacy Duverney, M.D.          Subjective: Patient denies any fevers, chills, shortness of breath, nausea, vomiting, diarrhea, rashes, headache, syncope. He has suprapubic pain that started Thursday. Complains of dysuria.  Objective: Filed Vitals:   03/10/12 0045 03/10/12 0139 03/10/12 0545 03/10/12 0750  BP:  162/77 162/83 150/70  Pulse: 58 60 74 67  Temp:  97.8 F (36.6 C) 98.1 F (36.7 C) 97.4 F (36.3 C)  TempSrc:  Oral Oral Oral  Resp: 15 16 18 20   Height:  5\' 9"  (1.753 m)    Weight:  66.1 kg (145 lb 11.6 oz)    SpO2: 98% 98% 95% 95%    Intake/Output Summary (Last 24 hours) at 03/10/12 0911 Last data filed at 03/10/12 9562  Gross per 24 hour  Intake 353.75 ml  Output    500 ml  Net -146.25 ml   Weight change:  Exam:   General:  Pt is alert, follows commands appropriately, not in acute distress  HEENT: No icterus, No thrush,  [place]/AT; edentulous  Cardiovascular: RRR, S1/S2, no  rubs, no gallops  Respiratory: Bibasilar crackles. Left clear to auscultation. Good air movement. No wheezes or rhonchi.  Abdomen: Suprapubic pain. Positive bowel sounds. Soft, no guarding, no peritoneal  signs  Extremities: No edema, No lymphangitis, No petechiae, No rashes, no synovitis  Data Reviewed: Basic Metabolic Panel:  Lab 03/10/12 1610 03/09/12 1822  NA 138 138  K 3.7 3.9  CL 106 104  CO2 26 26  GLUCOSE 80 199*  BUN 17 19  CREATININE 0.79 0.85  CALCIUM 8.5 9.2  MG -- --  PHOS -- --   Liver Function Tests:  Lab 03/09/12 1822  AST 50*  ALT 22  ALKPHOS 111  BILITOT 0.7  PROT 5.9*  ALBUMIN 3.1*    Lab 03/09/12 1822  LIPASE 40  AMYLASE --   No results found for this basename: AMMONIA:5 in the last 168 hours CBC:  Lab 03/10/12 0450 03/09/12 1822  WBC 3.8* 4.6  NEUTROABS -- 3.0  HGB 10.8* 11.4*  HCT 31.4* 34.0*  MCV 91.3 90.9  PLT 112* 126*   Cardiac Enzymes:  Lab 03/09/12 1826  CKTOTAL --  CKMB --  CKMBINDEX --  TROPONINI <0.30   BNP: No components found with this basename: POCBNP:5 CBG: No results found for this basename: GLUCAP:5 in the last 168 hours  No results found for this or any previous visit (from the past 240 hour(s)).   Scheduled Meds:   . [COMPLETED] sodium chloride  1,000 mL Intravenous Once  . amiodarone  100 mg Oral Q M,W,F  . antiseptic oral rinse  15 mL Mouth Rinse BID  . [COMPLETED] cefTRIAXone (ROCEPHIN)  IV  1 g Intravenous Once  . cefTRIAXone (ROCEPHIN)  IV  1 g Intravenous Q24H  . levothyroxine  100 mcg Oral QAC breakfast  . metoprolol tartrate  25 mg Oral BID  . [COMPLETED] ondansetron (ZOFRAN) IV  4 mg Intravenous Once  . [COMPLETED] pantoprazole (PROTONIX) IV  40 mg Intravenous Once  . pneumococcal 23 valent vaccine  0.5 mL Intramuscular Tomorrow-1000  . [DISCONTINUED] sodium chloride   Intravenous STAT   Continuous Infusions:   . sodium chloride 1,000 mL (03/09/12 2238)  . sodium chloride 75 mL/hr at 03/10/12 0214     Sudais Banghart, DO  [facility] Pager [phone]  If 7PM-7AM, please contact night-coverage [email or web address] Password [email or web address] 03/10/2012, 9:11 AM   LOS: 1 day

## 2012-03-10 NOTE — ED Provider Notes (Signed)
Medical screening examination/treatment/procedure(s) were conducted as a shared visit with non-physician practitioner(s) and myself.  I personally evaluated the patient during the encounter   Raymond Octave, MD 03/10/12 [phone]

## 2012-03-11 ENCOUNTER — Inpatient Hospital Stay (HOSPITAL_COMMUNITY): Payer: Medicare PPO

## 2012-03-11 ENCOUNTER — Encounter (HOSPITAL_COMMUNITY): Payer: Self-pay | Admitting: General Practice

## 2012-03-11 LAB — CBC
HCT: 30.8 % — ABNORMAL LOW (ref 39.0–52.0)
Hemoglobin: 10.6 g/dL — ABNORMAL LOW (ref 13.0–17.0)
MCH: 30.8 pg (ref 26.0–34.0)
MCHC: 34.4 g/dL (ref 30.0–36.0)
RDW: 14.3 % (ref 11.5–15.5)

## 2012-03-11 LAB — PROTIME-INR
INR: 3.28 — ABNORMAL HIGH (ref 0.00–1.49)
Prothrombin Time: 31.6 seconds — ABNORMAL HIGH (ref 11.6–15.2)

## 2012-03-11 NOTE — Progress Notes (Signed)
Nutrition Brief Note  Patient identified on the Malnutrition Screening Tool (MST) Report. Upon chart review, pt's weight has been within 6 lb in the past 7 months; no significant wt changes. Also noted pt is currently eating 100% of his heart healthy meals. No skin breakdown.  Body mass index is 22.53 kg/(m^2). Pt meets criteria for normal weight based on current BMI.   Current diet order is Heart Healthy, patient is consuming approximately 100% of meals at this time. Labs and medications reviewed.   No nutrition interventions warranted at this time. If nutrition issues arise, please consult RD.   Raymond Motto MS, RD, LDN Pager: [phone] After-hours pager: [phone]

## 2012-03-11 NOTE — Progress Notes (Signed)
[facility] PROGRESS NOTE  Raymond Gamel Sr. ZOX:[id number] DOB: 1923/02/10 DOA: 03/09/2012 PCP: Herb Grays, MD  Assessment/Plan: Acute hematuria  -Started spontaneously on Thursday  -Patient continues to have dysuria and suprapubic pain  -Urine culture was not done, question yield at this point now patient is on antibiotics  -appreciate Dr. Vevelyn Royals assistance -Foley catheter placed, no new blood after flushing bladder Supratherapeutic INR  -INR 3.31 at the time of admission--today INR was 3.28 -Hold warfarin  -Hemoglobin remained stable, hemodynamically stable  -May eventually need to discuss with family stopping Coumadin altogether as patient has been having falls  Thrombocytopenia  -This has been chronic  -Patient has splenomegaly, cirrhotic appearing liver on CT  -May also partly be due to Coumadin  -Check B12 level  Atrial fibrillation  -Currently in sinus  -Continue amiodarone, metoprolol  Hypothyroidism  -Continue Synthroid Chest pain -Secondary to patient's recent fall -Check chest x-ray -EKG on November 16 showed sinus rhythm without any ST-T wave changes     Family Communication:   daughter at beside Disposition Plan:   Home when medically stable    Antibiotics:  Ceftriaxone November 16>>>    Procedures/Studies: Ct Abdomen Pelvis Wo Contrast  03/09/2012  *RADIOLOGY REPORT*  Clinical Data: GI bleed.  Hematuria.  CT ABDOMEN AND PELVIS WITHOUT CONTRAST  Technique:  Multidetector CT imaging of the abdomen and pelvis was performed following the standard protocol without intravenous contrast.  Comparison: 08/06/2007.  Findings: Moderate amount of hemorrhage within the bladder. Etiology indeterminate.  Proximal small bowel loops with slightly thickened appearance. This may be related to under distension.  Inflammation not excluded.  Otherwise no extraluminal bowel inflammatory process, free fluid or free air is noted.  Cirrhotic appearance of the liver.  The  spleen is elongated spanning over 12.5 cm.  Post cholecystectomy.  Low density lesions in the kidneys may be cyst although some too small to characterize.  Evaluation of solid abdominal viscera is limited by lack of IV contrast.  Taking this limitation into account no focal hepatic, worrisome splenic, pancreatic or adrenal lesion.  Scoliosis and degenerative changes lumbar spine.  Atherosclerotic type changes of the aorta with ectasia.  Ectatic iliac arteries.  Within the right inguinal hernia rounded structures noted which may represent fluid lymph nodes.  Coronary artery calcifications.  Scarring lung bases.  IMPRESSION:  Moderate amount of hemorrhage within the bladder.  Etiology indeterminate.  Proximal small bowel loops with slightly thickened appearance. This may be related to under distension.  Inflammation not excluded.  Otherwise no extraluminal bowel inflammatory process, free fluid or free air is noted.  Cirrhotic appearance of the liver.  The spleen is elongated spanning over 12.5 cm.   Original Report Authenticated By: Lacy Duverney, M.D.          Subjective: Patient complains of some chest wall pain reproducible on palpation. Denies any shortness of breath, nausea, vomiting, diarrhea, dizziness. Denies any fevers or chills   Objective: Filed Vitals:   03/11/12 0409 03/11/12 0902 03/11/12 1350 03/11/12 1742  BP: 159/63 156/66 144/60 146/64  Pulse: 62 78 56 64  Temp: 98.3 F (36.8 C) 98.2 F (36.8 C) 98.8 F (37.1 C) 98.7 F (37.1 C)  TempSrc: Oral Oral Oral Oral  Resp: 18 18 18 18   Height:    5\' 9"  (1.753 m)  Weight:      SpO2: 96% 99% 97% 99%    Intake/Output Summary (Last 24 hours) at 03/11/12 1847 Last data filed at 03/11/12 1700  Gross  per 24 hour  Intake 2442.08 ml  Output   3160 ml  Net -717.92 ml   Weight change: 3.1 kg (6 lb 13.4 oz) Exam:   General:  Pt is alert, follows commands appropriately, not in acute distress  HEENT: No icterus, No thrush,  [place]/AT  Cardiovascular: RRR, S1/S2, no rubs, no gallops  Respiratory: Fine left basilar crackles, right clear to auscultation  Abdomen: Soft/+BS, non tender, non distended, no guarding  Extremities: No edema, No lymphangitis, No petechiae, No rashes, no synovitis  Data Reviewed: Basic Metabolic Panel:  Lab 03/10/12 9562 03/09/12 1822  NA 138 138  K 3.7 3.9  CL 106 104  CO2 26 26  GLUCOSE 80 199*  BUN 17 19  CREATININE 0.79 0.85  CALCIUM 8.5 9.2  MG -- --  PHOS -- --   Liver Function Tests:  Lab 03/09/12 1822  AST 50*  ALT 22  ALKPHOS 111  BILITOT 0.7  PROT 5.9*  ALBUMIN 3.1*    Lab 03/09/12 1822  LIPASE 40  AMYLASE --   No results found for this basename: AMMONIA:5 in the last 168 hours CBC:  Lab 03/11/12 0424 03/10/12 0450 03/09/12 1822  WBC 6.1 3.8* 4.6  NEUTROABS -- -- 3.0  HGB 10.6* 10.8* 11.4*  HCT 30.8* 31.4* 34.0*  MCV 89.5 91.3 90.9  PLT 115* 112* 126*   Cardiac Enzymes:  Lab 03/09/12 1826  CKTOTAL --  CKMB --  CKMBINDEX --  TROPONINI <0.30   BNP: No components found with this basename: POCBNP:5 CBG: No results found for this basename: GLUCAP:5 in the last 168 hours  No results found for this or any previous visit (from the past 240 hour(s)).   Scheduled Meds:   . amiodarone  100 mg Oral Q M,W,F  . antiseptic oral rinse  15 mL Mouth Rinse BID  . cefTRIAXone (ROCEPHIN)  IV  1 g Intravenous Q24H  . levothyroxine  100 mcg Oral QAC breakfast  . metoprolol tartrate  25 mg Oral BID  . mirtazapine  30 mg Oral QHS  . [COMPLETED] pneumococcal 23 valent vaccine  0.5 mL Intramuscular Tomorrow-1000  . simvastatin  20 mg Oral q1800   Continuous Infusions:   . sodium chloride 1,000 mL (03/11/12 1243)     Jozalyn Baglio, DO  [facility] Pager [phone]  If 7PM-7AM, please contact night-coverage [email or web address] Password [email or web address] 03/11/2012, 6:47 PM   LOS: 2 days

## 2012-03-11 NOTE — Progress Notes (Signed)
Utilization review completed.  

## 2012-03-12 LAB — CBC
HCT: 34.4 % — ABNORMAL LOW (ref 39.0–52.0)
MCH: 30.9 pg (ref 26.0–34.0)
MCV: 90.1 fL (ref 78.0–100.0)
RBC: 3.82 MIL/uL — ABNORMAL LOW (ref 4.22–5.81)
WBC: 7.7 10*3/uL (ref 4.0–10.5)

## 2012-03-12 LAB — BASIC METABOLIC PANEL
BUN: 11 mg/dL (ref 6–23)
CO2: 23 mEq/L (ref 19–32)
Calcium: 8.5 mg/dL (ref 8.4–10.5)
Chloride: 105 mEq/L (ref 96–112)
Creatinine, Ser: 0.65 mg/dL (ref 0.50–1.35)

## 2012-03-12 MED ORDER — WARFARIN SODIUM 1 MG PO TABS: 1.0000 mg | ORAL_TABLET | ORAL | Status: DC

## 2012-03-12 MED ORDER — POTASSIUM CHLORIDE 20 MEQ/15ML (10%) PO LIQD
20.0000 meq | Freq: Once | ORAL | Status: DC
  Filled 2012-03-12: qty 15

## 2012-03-12 MED ORDER — WARFARIN SODIUM 2.5 MG PO TABS: ORAL_TABLET | ORAL | Status: DC

## 2012-03-12 MED ORDER — CEFUROXIME AXETIL 250 MG PO TABS: 250.0000 mg | ORAL_TABLET | Freq: Two times a day (BID) | ORAL | Status: DC

## 2012-03-12 NOTE — Progress Notes (Signed)
Patient discharged home with daughter. Patient was given discharge instructions and information of medications. Patient was told to call MD or go to the emergency room if symptoms reappear. Patient was stable upon discharge.

## 2012-03-12 NOTE — Progress Notes (Signed)
PHARMACIST - PHYSICIAN COMMUNICATION DR:  Tat CONCERNING:  Coumadin   RECOMMENDATION: Raymond Larsen INR is now at 1.94.  If Coumadin is to continue at discharge, recommend 2.5 mg Monday through Friday with none on Saturday and Sundays.  If he is not to be discharged today, please consider ordering coumadin protocol so pharmacy may manage.   Thank you.  Okey Regal, PharmD [phone]

## 2012-03-12 NOTE — Discharge Summary (Signed)
Physician Discharge Summary  Raymond Sainvil Sr. ZOX:[id number] DOB: 03/13/1923 DOA: 03/09/2012  PCP: Herb Grays, MD  Admit date: 03/09/2012 Discharge date: 03/12/2012  Recommendations for Outpatient Follow-up:  1. Pt will need to follow up with PCP in 2 weeks post discharge 2. Dr. Imelda Pillow on 03/19/2012 11:15 am 3. Followup with Dr. Donnie Aho, cardiology on Thursday, 03/14/2012 for INR checked  Discharge Diagnoses:  Principal Problem:  *Hematuria Active Problems:  Hemorrhagic cystitis  UTI (lower urinary tract infection)  Warfarin-induced coagulopathy  Atrial fibrillation  Hypertension  Hypothyroid  BPH (benign prostatic hyperplasia)  Hyperlipidemia Acute hematuria  -Started spontaneously on Thursday  -resolved -Urine culture was not done, question yield at this point now patient is on antibiotics  -appreciate Dr. Vevelyn Royals assistance  -Foley catheter placed, no new blood after flushing bladder  Supratherapeutic INR  -INR 3.31 at the time of admission--today INR was 1.94 -Continue warfarin 2.5 mg Monday through Friday. No warfarin on Saturday & Sunday. -Hemoglobin remained stable, hemodynamically stable  -May eventually need to discuss with family stopping Coumadin altogether as patient has been having falls--follow up with Dr. Donnie Aho -Go to Dr. York Spaniel office for INR check on Thursday, 03/14/2012 Thrombocytopenia  -This has been chronic  -Patient has splenomegaly, cirrhotic appearing liver on CT  -May also partly be due to Coumadin  Atrial fibrillation  -Currently in sinus  -Continue amiodarone, metoprolol  Hypothyroidism  -Continue Synthroid  Chest pain  -Secondary to patient's recent fall  -Check chest x-ray--no acute pathology -EKG on November 16 showed sinus rhythm without any ST-T wave changes   Discharge Condition: Stable  Disposition: Discharge home Follow-up Information    Follow up with Jethro Bolus I, MD. (November 26,2013--11:15AM if symptoms  worsen as needed as needed if symptoms worsen)    Contact information:   [address] [place] [address] [phone]          Diet: Cardiac Wt Readings from Last 3 Encounters:  03/11/12 66.8 kg (147 lb 4.3 oz)  08/08/11 71.668 kg (158 lb)  06/13/11 73.029 kg (161 lb)    History of present illness:  78 y.o. male who presents to the ED with complaints of bloody urine X 3 days and lower ABD Pain. He reports passing large blood clots. He denies having any back pain or fevers or chills. He is on coumadin for atrial fibrillation. In the ED he was found to have a +UA, and was placed on IV Rocephin. His INR was found to be 3.31   Hospital Course:  The patient was placed on ceftriaxone and admitted to telemetry. Urine culture was never performed, but the patient was treated empirically with ceftriaxone. He'll be discharged home with cefuroxime 250 mg twice a day for 3 additional days. Urology, Dr. Crecencio Mc was consulted to see the patient. A Foley catheter was placed by Dr. Laverle Patter and the patient's bladder was irrigated. He chills cough without any active bleeding. A Foley catheter was left in place for approximately 36 hours. The patient did not have any further hematuria. The patient's warfarin was discontinued. His INR trended down. On the day of discharge, his INR was 1.94. The patient was instructed to restart warfarin 2.5 mg daily on Monday to Friday, and to hold Coumadin dosing on Saturday and Sundays. The patient remained hemodynamically stable. He remained in sinus  rhythm. He was continued on his amiodarone and metoprolol tartrate. The patient's hemoglobin remained stable. EKG showed sinus rhythm without any high degree AV blocks. The patient was noted to be pancytopenic. CT of the abdomen and pelvis suggested a cirrhotic liver as well as splenomegaly. This will need to be worked up as an outpatient. The patient tolerated his diet  without difficulty. There is no vomiting. His abdominal pain improved. The patient was instructed to followup with Dr. York Spaniel office for his INR to be checked on Thursday, 03/14/2012. Further warfarin adjustments will be based upon his outpatient checks. Prior to discharge, a followup appointment with Dr. Cassell Smiles was set up for the patient on 03/19/2012 on 11:15 AM.  Consultants: Dr. Crecencio Mc  Discharge Exam: Filed Vitals:   03/12/12 0915  BP: 141/71  Pulse: 79  Temp: 98.2 F (36.8 C)  Resp: 20   Filed Vitals:   03/11/12 2009 03/11/12 2350 03/12/12 0400 03/12/12 0915  BP: 122/70 135/67 158/85 141/71  Pulse: 58 66 67 79  Temp: 97.9 F (36.6 C) 98.2 F (36.8 C) 98 F (36.7 C) 98.2 F (36.8 C)  TempSrc: Oral Oral Oral Oral  Resp: 18 18 18 20   Height:      Weight: 66.8 kg (147 lb 4.3 oz)     SpO2: 94% 95% 98% 97%   General: A&O x 3, NAD, pleasant, cooperative Cardiovascular: RRR, no rub, no gallop, no S3 Respiratory: CTAB, no wheeze, no rhonchi Abdomen:soft, nontender, nondistended, positive bowel sounds Extremities: No edema, No lymphangitis, no petechiae  Discharge Instructions  Discharge Orders    Future Orders Please Complete By Expires   Diet - low sodium heart healthy      Increase activity slowly      Discharge instructions      Comments:   Take Coumadin 2.5mg   On Monday thru Friday.  Do NOT take any coumadin Saturday or Sunday. Go to Dr. York Spaniel office to get coumadin level checked on Thursday, 03/14/12--they will make futher adjustments followup with Dr. Imelda Pillow (urologist) on 03/09/12 at 11:15AM Cefuroxime  250mg   Twice a day x 3 days       Medication List     As of 03/12/2012  1:42 PM    TAKE these medications         amiodarone 200 MG tablet   Commonly known as: PACERONE   Take 100 mg by mouth every Monday, Wednesday, and Friday.      cefUROXime 250 MG tablet   Commonly known as: CEFTIN   Take 1 tablet (250 mg total) by mouth 2 (two)  times daily.      fenofibrate 160 MG tablet   Take 160 mg by mouth daily.      fish oil-omega-3 fatty acids 1000 MG capsule   Take 1 g by mouth 2 (two) times daily.      HYDROcodone-acetaminophen 5-325 MG per tablet   Commonly known as: NORCO/VICODIN   Take 1 tablet by mouth every 6 (six) hours as needed. For pain      levothyroxine 100 MCG tablet   Commonly known as: SYNTHROID, LEVOTHROID   Take 100 mcg by mouth daily.      meclizine 25 MG tablet   Commonly known as: ANTIVERT   Take 25 mg by mouth 2 (two) times daily as needed. For dizziness        metoprolol tartrate 25 MG tablet   Commonly known as: LOPRESSOR   Take 25 mg by mouth 2 (  two) times daily.      mirtazapine 30 MG tablet   Commonly known as: REMERON   Take 30 mg by mouth At bedtime.      nitroGLYCERIN 0.4 MG SL tablet   Commonly known as: NITROSTAT   Place 0.4 mg under the tongue every 5 (five) minutes as needed. For chest pain      pantoprazole 40 MG tablet   Commonly known as: PROTONIX   Take 40 mg by mouth daily.      polyethylene glycol packet   Commonly known as: MIRALAX / GLYCOLAX   Take 17 g by mouth 2 (two) times daily.      pravastatin 40 MG tablet   Commonly known as: PRAVACHOL   Take 80 mg by mouth daily.      temazepam 15 MG capsule   Commonly known as: RESTORIL   Take 15 mg by mouth at bedtime.      warfarin 2.5 MG tablet   Commonly known as: COUMADIN   2.5 mg daily MON-Fri.  Do NOT take Sat and Sun.         The results of significant diagnostics from this hospitalization (including imaging, microbiology, ancillary and laboratory) are listed below for reference.    Significant Diagnostic Studies: Ct Abdomen Pelvis Wo Contrast  03/09/2012  *RADIOLOGY REPORT*  Clinical Data: GI bleed.  Hematuria.  CT ABDOMEN AND PELVIS WITHOUT CONTRAST  Technique:  Multidetector CT imaging of the abdomen and pelvis was performed following the standard protocol without intravenous contrast.   Comparison: 08/06/2007.  Findings: Moderate amount of hemorrhage within the bladder. Etiology indeterminate.  Proximal small bowel loops with slightly thickened appearance. This may be related to under distension.  Inflammation not excluded.  Otherwise no extraluminal bowel inflammatory process, free fluid or free air is noted.  Cirrhotic appearance of the liver.  The spleen is elongated spanning over 12.5 cm.  Post cholecystectomy.  Low density lesions in the kidneys may be cyst although some too small to characterize.  Evaluation of solid abdominal viscera is limited by lack of IV contrast.  Taking this limitation into account no focal hepatic, worrisome splenic, pancreatic or adrenal lesion.  Scoliosis and degenerative changes lumbar spine.  Atherosclerotic type changes of the aorta with ectasia.  Ectatic iliac arteries.  Within the right inguinal hernia rounded structures noted which may represent fluid lymph nodes.  Coronary artery calcifications.  Scarring lung bases.  IMPRESSION:  Moderate amount of hemorrhage within the bladder.  Etiology indeterminate.  Proximal small bowel loops with slightly thickened appearance. This may be related to under distension.  Inflammation not excluded.  Otherwise no extraluminal bowel inflammatory process, free fluid or free air is noted.  Cirrhotic appearance of the liver.  The spleen is elongated spanning over 12.5 cm.   Original Report Authenticated By: Lacy Duverney, M.D.    Dg Chest 2 View  03/11/2012  *RADIOLOGY REPORT*  Clinical Data: Chest pain and recent fall  CHEST - 2 VIEW  Comparison: 05/03/2011  Findings: The heart and pulmonary vascularity are within normal limits.  The lungs are clear bilaterally.  A compression deformity is again noted within the mid thoracic spine.  Postsurgical changes in the right shoulder are noted.  IMPRESSION: No acute abnormality noted.   Original Report Authenticated By: Alcide Clever, M.D.      Microbiology: No results found for  this or any previous visit (from the past 240 hour(s)).   Labs: Basic Metabolic Panel:  Lab 03/12/12 1610 03/10/12  0450 03/09/12 1822  NA 135 138 138  K 3.4* 3.7 --  CL 105 106 104  CO2 23 26 26   GLUCOSE 132* 80 199*  BUN 11 17 19   CREATININE 0.65 0.79 0.85  CALCIUM 8.5 8.5 9.2  MG -- -- --  PHOS -- -- --   Liver Function Tests:  Lab 03/09/12 1822  AST 50*  ALT 22  ALKPHOS 111  BILITOT 0.7  PROT 5.9*  ALBUMIN 3.1*    Lab 03/09/12 1822  LIPASE 40  AMYLASE --   No results found for this basename: AMMONIA:5 in the last 168 hours CBC:  Lab 03/12/12 1005 03/11/12 0424 03/10/12 0450 03/09/12 1822  WBC 7.7 6.1 3.8* 4.6  NEUTROABS -- -- -- 3.0  HGB 11.8* 10.6* 10.8* 11.4*  HCT 34.4* 30.8* 31.4* 34.0*  MCV 90.1 89.5 91.3 90.9  PLT 134* 115* 112* 126*   Cardiac Enzymes:  Lab 03/09/12 1826  CKTOTAL --  CKMB --  CKMBINDEX --  TROPONINI <0.30   BNP: No components found with this basename: POCBNP:5 CBG: No results found for this basename: GLUCAP:5 in the last 168 hours  Time coordinating discharge:  Greater than 30 minutes  Signed:  Tawania Daponte, DO [facility] Pager: [phone] 03/12/2012, 1:42 PM

## 2012-06-11 ENCOUNTER — Ambulatory Visit
Admission: RE | Admit: 2012-06-11 | Discharge: 2012-06-11 | Disposition: A | Discharge status: Home / Self Care | Payer: Medicare PPO | Source: Ambulatory Visit | Attending: Cardiology | Admitting: Cardiology

## 2012-06-11 ENCOUNTER — Other Ambulatory Visit: Payer: Self-pay | Admitting: Cardiology

## 2012-06-11 DIAGNOSIS — R0602 Shortness of breath: Secondary | ICD-10-CM

## 2012-06-13 ENCOUNTER — Ambulatory Visit
Admission: RE | Admit: 2012-06-13 | Discharge: 2012-06-13 | Disposition: A | Discharge status: Home / Self Care | Payer: Medicare PPO | Source: Ambulatory Visit | Attending: Cardiology | Admitting: Cardiology

## 2012-06-13 ENCOUNTER — Other Ambulatory Visit: Payer: Self-pay | Admitting: Cardiology

## 2012-06-13 DIAGNOSIS — M25473 Effusion, unspecified ankle: Secondary | ICD-10-CM

## 2012-06-13 MED ORDER — IOHEXOL 350 MG/ML SOLN
125.0000 mL | Freq: Once | INTRAVENOUS | Status: AC | PRN
  Administered 2012-06-13: 125 mL via INTRAVENOUS

## 2012-06-14 ENCOUNTER — Other Ambulatory Visit: Payer: Medicare PPO

## 2012-06-14 DIAGNOSIS — K7031 Alcoholic cirrhosis of liver with ascites: Secondary | ICD-10-CM

## 2012-06-14 DIAGNOSIS — M4854XA Collapsed vertebra, not elsewhere classified, thoracic region, initial encounter for fracture: Secondary | ICD-10-CM

## 2012-06-14 HISTORY — DX: Collapsed vertebra, not elsewhere classified, thoracic region, initial encounter for fracture: M48.54XA

## 2012-06-14 HISTORY — DX: Alcoholic cirrhosis of liver with ascites: K70.31

## 2012-07-01 ENCOUNTER — Encounter (INDEPENDENT_AMBULATORY_CARE_PROVIDER_SITE_OTHER): Payer: Medicare PPO | Admitting: *Deleted

## 2012-07-01 DIAGNOSIS — R609 Edema, unspecified: Secondary | ICD-10-CM

## 2012-07-16 ENCOUNTER — Ambulatory Visit: Payer: Medicare PPO | Admitting: Family Medicine

## 2012-07-18 ENCOUNTER — Ambulatory Visit (INDEPENDENT_AMBULATORY_CARE_PROVIDER_SITE_OTHER): Payer: Medicare PPO | Admitting: Family Medicine

## 2012-07-18 ENCOUNTER — Encounter: Payer: Self-pay | Admitting: Family Medicine

## 2012-07-18 VITALS — BP 185/76 | HR 64 | Temp 98.4°F | Resp 14 | Ht 67.0 in | Wt 161.2 lb

## 2012-07-18 DIAGNOSIS — R609 Edema, unspecified: Secondary | ICD-10-CM

## 2012-07-18 DIAGNOSIS — E039 Hypothyroidism, unspecified: Secondary | ICD-10-CM

## 2012-07-18 DIAGNOSIS — K7460 Unspecified cirrhosis of liver: Secondary | ICD-10-CM

## 2012-07-18 DIAGNOSIS — I4891 Unspecified atrial fibrillation: Secondary | ICD-10-CM

## 2012-07-18 DIAGNOSIS — R1900 Intra-abdominal and pelvic swelling, mass and lump, unspecified site: Secondary | ICD-10-CM

## 2012-07-18 LAB — COMPREHENSIVE METABOLIC PANEL
AST: 70 U/L — ABNORMAL HIGH (ref 0–37)
Albumin: 3 g/dL — ABNORMAL LOW (ref 3.5–5.2)
BUN: 15 mg/dL (ref 6–23)
Calcium: 8.5 mg/dL (ref 8.4–10.5)
Chloride: 106 mEq/L (ref 96–112)
Glucose, Bld: 89 mg/dL (ref 70–99)
Potassium: 3.9 mEq/L (ref 3.5–5.1)

## 2012-07-18 LAB — CBC WITH DIFFERENTIAL/PLATELET
Basophils Relative: 0.6 % (ref 0.0–3.0)
Hemoglobin: 11.1 g/dL — ABNORMAL LOW (ref 13.0–17.0)
Lymphocytes Relative: 26.3 % (ref 12.0–46.0)
Monocytes Relative: 8.3 % (ref 3.0–12.0)
Neutro Abs: 3.3 10*3/uL (ref 1.4–7.7)
RBC: 3.5 Mil/uL — ABNORMAL LOW (ref 4.22–5.81)

## 2012-07-18 LAB — PROTIME-INR: Prothrombin Time: 14.3 s — ABNORMAL HIGH (ref 10.2–12.4)

## 2012-07-18 MED ORDER — LORAZEPAM 0.5 MG PO TABS: ORAL_TABLET | ORAL | Status: DC

## 2012-07-18 NOTE — Patient Instructions (Signed)
Elevate your legs above the level of your heart for 20 minutes twice per day. Wear compression stockings. Eat a low sodium diet.

## 2012-07-18 NOTE — Progress Notes (Signed)
Office Note 07/20/2012  CC:  Chief Complaint  Patient presents with  . Establish Care    HPI:  Raymond Maharaj Sr. is a 79 y.o. White male who is here with his daughter Raymond Larsen to establish care. Patient's most recent primary MD: Dr. Dewain Penning. Old records were reviewed ([facility] EMR) prior to or during today's visit.  Has hx of a-fib, was on coumadin but was taken off of this med and left on 81mg  ASA around January this year due to some urinary bleeding issues apparently associated with infection.  Some concern is expressed about anxiety/sleep med, apparently recently he became oversedated when rx'd temazepam (on top of lorazpam?? -unclear) and he fell--no fractures per pt.  Pt and daughter say his biggist issue today is swelling in legs and abdomen.  They both swear he had no swelling at all in the past up until about the last week or so.  He also says his penis is swollen. Admits to past hx of heavy drinking--quit 30 yrs ago.  Review of CT angio to r/o PE 05/2012 shows mention of cirrhotic liver with ascites and distal esophageal varices. He and daughter deny any known liver problems. He says he has chronic urinary frequency, but no obstruction sx's and no dyrusia or hematuria.  Denies CP, SOB, cough, or palpitations.       Past Medical History  Diagnosis Date  . Arthritis   . Hyperlipidemia   . Hypertension   . A-fib     Dr Donnie Aho; WAS on Coumadin until 04/2012  . Angina     uses NTG prn  . Pneumonia     12 /2012  . Recurrent upper respiratory infection (URI)     03/2011  . Hypothyroidism     x 10 yrs  . GERD (gastroesophageal reflux disease)   . Constipation, chronic   . Depression   . History of dizziness   . Anxiety   . Urinary frequency   . Hearing loss of both ears     wears hearing aides  . BPH with obstruction/lower urinary tract symptoms     sees Dr Patsi Sears  . Compression fracture of thoracic vertebra, non-traumatic 06/14/12    T6 and T7 (noted on CT  angio chest to r/o PE)  . Alcoholic cirrhosis of liver with ascites 06/14/12    Noted on CT angio chest to r/o PE  . Esophageal varices in alcoholic cirrhosis "   "    "   "    "    Past Surgical History  Procedure Laterality Date  . Total knee arthroplasty  1994    right  . Total shoulder replacement  1997    right  . Joint replacement      Rt knee and shoulder  . Hernia repair  1990    LIH repair  . Cholecystectomy  2004  . Inguinal hernia repair  05/09/2011    Procedure: HERNIA REPAIR INGUINAL ADULT;  Surgeon: Adolph Pollack, MD;  Location: [facility] OR;  Service: General;  Laterality: Right;    No family history on file.  History   Social History  . Marital Status: Married    Spouse Name: N/A    Number of Children: N/A  . Years of Education: N/A   Occupational History  . Not on file.   Social History Main Topics  . Smoking status: Former Smoker -- 0.50 packs/day for 35 years    Types: Cigarettes    Quit date: 05/02/1984  .  Smokeless tobacco: Current User    Types: Chew  . Alcohol Use: No  . Drug Use: No  . Sexually Active: Not Currently   Other Topics Concern  . Not on file   Social History Narrative   Lives with wife in [place].   Wife with dementia.   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  Served in the [employer] ([employer] [facility] [place]   Hx of alcohol abuse up until his 35s.   Says he smoked "years ago" but "I never did inhale.   No chewing tobacco or dip.                   Outpatient Encounter Prescriptions as of 07/18/2012  Medication Sig Dispense Refill  . amiodarone (PACERONE) 200 MG tablet Take 100 mg by mouth every Monday, Wednesday, and Friday.       Marland Kitchen aspirin 81 MG tablet Take 81 mg by mouth daily.      . fenofibrate 160 MG tablet Take 160 mg by mouth daily.       . fish oil-omega-3 fatty acids 1000 MG capsule Take 1 g by mouth 2 (two) times daily.      Marland Kitchen HYDROcodone-acetaminophen (NORCO) 5-325 MG per tablet Take 1 tablet  by mouth every 6 (six) hours as needed. For pain      . levothyroxine (SYNTHROID, LEVOTHROID) 100 MCG tablet Take 100 mcg by mouth daily.      . meclizine (ANTIVERT) 25 MG tablet Take 25 mg by mouth 2 (two) times daily as needed. For dizziness       . metoprolol tartrate (LOPRESSOR) 25 MG tablet Take 25 mg by mouth 2 (two) times daily.       . mirtazapine (REMERON) 30 MG tablet Take 30 mg by mouth At bedtime.       . nitroGLYCERIN (NITROSTAT) 0.4 MG SL tablet Place 0.4 mg under the tongue every 5 (five) minutes as needed. For chest pain      . pantoprazole (PROTONIX) 40 MG tablet Take 40 mg by mouth daily.       . polyethylene glycol (MIRALAX / GLYCOLAX) packet Take 17 g by mouth 2 (two) times daily.       . pravastatin (PRAVACHOL) 40 MG tablet Take 80 mg by mouth daily.       . finasteride (PROSCAR) 5 MG tablet       . LORazepam (ATIVAN) 0.5 MG tablet 1-2 tabs po qhs prn  60 tablet  1  . [DISCONTINUED] cefUROXime (CEFTIN) 250 MG tablet Take 1 tablet (250 mg total) by mouth 2 (two) times daily.  6 tablet  0  . [DISCONTINUED] temazepam (RESTORIL) 15 MG capsule Take 15 mg by mouth at bedtime.       . [DISCONTINUED] warfarin (COUMADIN) 2.5 MG tablet 2.5 mg daily MON-Fri.  Do NOT take Sat and Sun.  30 tablet  0   No facility-administered encounter medications on file as of 07/18/2012.    Allergies  Allergen Reactions  . Amitriptyline Other (See Comments)    Dizziness   . Beta Adrenergic Blockers Other (See Comments)    lethargy  . Imdur (Isosorbide)     Headache   . Niacin And Related Rash       . Sulfa Antibiotics Itching, Nausea And Vomiting and Rash    ROS: as per HPI, also-- Review of Systems  Constitutional: Negative for fever and fatigue.  HENT: Negative for congestion, sore throat,  neck pain and sinus pressure.   Eyes: Negative for visual disturbance.  Gastrointestinal: Negative for nausea, abdominal pain, diarrhea, constipation and blood in stool.  Genitourinary: Negative  for dysuria, flank pain and scrotal swelling.  Musculoskeletal: Negative for back pain and joint swelling.  Skin: Negative for color change and rash.  Neurological: Negative for weakness and headaches.  Hematological: Negative for adenopathy.  Psychiatric/Behavioral: Negative for dysphoric mood.    PE; Blood pressure 185/76, pulse 64, temperature 98.4 F (36.9 C), temperature source Temporal, resp. rate 14, height 5\' 7"  (1.702 m), weight 161 lb 4 oz (73.143 kg), SpO2 97.00%. Gen: alert, frail appearing, uses a cane, NAD, hard of hearing. ENT: Ears: EACs clear, normal epithelium.  TMs with good light reflex and landmarks bilaterally.  Eyes: no injection, icteris, swelling, or exudate.  EOMI, PERRLA. Nose: no drainage or turbinate edema/swelling.  No injection or focal lesion.  Mouth: lips without lesion/swelling.  Oral mucosa pink and moist.  Oropharynx without erythema, exudate, or swelling.  Neck - No masses or thyromegaly or limitation in range of motion CV: RRR LUNGS: CTA bilat, nonlabored resps. ABD: soft, no significant distention, no tenderness, no fluid wave.  No HSM or mass or bruit.  BS normal. RLQ with soft tissue protrusion that is easily reduceable.   GU: scrotum without swelling or edema.  Testes normal.  Penis uncircucised, foreskin mildly edematous but easily retractable to expose a normal glans.  Penis nontender.  No groin adenopathy. EXT: 3+ pitting edema in RLE, 2+ in LLE.  Calf circ 10 cm below patella is 37 1/2 cm on right, and 34 cm on left.  Pertinent labs:  NONE today  ASSESSMENT AND PLAN:   New pt: obtain old records.  Peripheral edema Appears acute-on-chronic.  Suspect he has chronic LE venous insufficiency and some edema from portal hypertension from his alcoholic cirrhosis. Discussed basic therapeutic measures to diminish edema. Will check UA for protein, check CBC, CMET, PT/INR. Will order abd u/s now. Will consider diuretics when all labs/imaging is  done.  Cirrhosis of liver Abd u/s ordered today. Labs ordered as well. After labs/imaging results are in then we'll likely get him on lasix, propranolol, and aldactone plus get him referred to GI for consideration of EGD to further assess for varices.  Atrial fibrillation Regular rhythm currently. On amio, ASA, metopr, has cardiology f/u tomorrow.  Hypothyroid Check TSH today.  An After Visit Summary was printed and given to the patient.  Spent 50 min with pt today, with >50% of this time spent in counseling and care coordination regarding the above problems.   Return in about 1 week (around 07/25/2012) for f/u edema.

## 2012-07-19 ENCOUNTER — Encounter: Payer: Self-pay | Admitting: Family Medicine

## 2012-07-19 ENCOUNTER — Encounter: Payer: Self-pay | Admitting: Cardiology

## 2012-07-19 DIAGNOSIS — I2510 Atherosclerotic heart disease of native coronary artery without angina pectoris: Secondary | ICD-10-CM | POA: Insufficient documentation

## 2012-07-19 DIAGNOSIS — K7031 Alcoholic cirrhosis of liver with ascites: Secondary | ICD-10-CM | POA: Insufficient documentation

## 2012-07-19 NOTE — Progress Notes (Signed)
Patient ID: Raymond Creasy Sr., male   DOB: [birth year], 79 y.o.   MRN: [id number] Raymond, Larsen  Date of visit:  07/19/2012 DOB:  04-16-1923    Age:  79 yrs. Medical record number:  [id number]     Account number:  [id number] Primary Care Provider: Herb Grays ____________________________ CURRENT DIAGNOSES  1. Dyspnea  2. CAD,Native  3. Arrhythmia-Atrial Fibrillation  4. Edema  5. Hypertensive Heart Disease-Benign without CHF  6. Hyperlipidemia  7. Hypothyroidism,unspecified  8. Dyspnea ____________________________ ALLERGIES  amitriptyline, Dizziness  Beta blockers, Intolerance-lethargy  Imdur, Headache  Niaspan Extended-Release, Rash  Sulfonamides, Rash ____________________________ MEDICATIONS  1. Miralax 17 gram/dose Powder, 1 p.o. daily  2. metoprolol tartrate 25 mg Tablet, BID  3. pravastatin 40 mg Tablet, 2 p.o. daily  4. nitroglycerin 0.4 mg Tablet, Sublingual, PRN  5. mirtazapine 30 mg Tablet, QHS  6. hydrocodone-acetaminophen 7.5-325 mg Tablet, PRN  7. Fish Oil 1,000 mg Capsule, qd  8. Tylenol 325 mg tablet, PRN  9. Vitamin B-12 1,000 mcg tablet, 1500mg  qd  10. meclizine 25 mg tablet, PRN  11. levothyroxine 100 mcg tablet, 1 p.o. daily  12. Aspirin Low-Strength 81 mg tablet,chewable, one daily  13. pantoprazole 40 mg tablet,delayed release (DR/EC), 1 p.o. daily  14. finasteride 5 mg tablet, 1 p.o. daily  15. furosemide 40 mg tablet, 1 p.o. daily ____________________________ CHIEF COMPLAINTS  Edema and dyspnea ____________________________ HISTORY OF PRESENT ILLNESS  Patient seen for cardiac followup. He continues to be quite weak and has significant peripheral edema. He was supposed to come back and see me one week after his last visit which was over one month ago. Since he was here he has gained about 12 pounds of weight. He had a CT scan done because of an elevated d-dimer but did not have any pulmonary emboli. There was incidental note made of some ascites and a question of  cirrhosis. On talking to him evidently he drank heavily over 30 years ago but has not had anything to drink since then. He has mild dyspnea. He is wearing support stockings and tries to elevate his legs. Venous Dopplers were negative for DVT. His BNP level was minimally elevated and a chest x-ray did not show significant congestive heart failure. He does not have any chest pain suggestive of angina. ____________________________ PAST HISTORY  Past Medical Illnesses:  hypertension, obesity, gout, hypothyroidism, lumbar disc disease, GERD, renal insufficiency, osteoarthritis, BPH, cirrohsis;  Cardiovascular Illnesses:  atrial fibrillation, cardiomyopathy(dilated);  Surgical Procedures:  shoulder repair-rt, right shoulder replacement, knee replacement-rt, TURP, herniorrhaphy, cholecystectomy (lap), inguinal herniorrhaphy-rt 2013;  NYHA Classification:  II;  Cardiology Procedures-Invasive:  cardiac cath (left) June 2004, cardioversion October 2004;  Cardiology Procedures-Noninvasive:  adenosine cardiolite April 2008, echocardiogram December 2007;  Cardiac Cath Results:  normal Left main, scattered irregularities, scattered irregularities LAD, scattered irregularities CFX, scattered irregularities RCA;  LVEF of 60% documented via echocardiogram on 06/12/2012,  CHADS Score:  2,  CHA2DS2-VASC Score:  3 ____________________________ CARDIO-PULMONARY TEST DATES EKG Date:  07/19/2012;   Cardiac Cath Date:  10/08/2002;  Nuclear Study Date:  08/16/2006;  Echocardiography Date: 06/12/2012;  Chest Xray Date: 06/11/2012;  CT Scan Date:  06/13/2012   ____________________________ SOCIAL HISTORY Alcohol Use:  does not use alcohol; former heavy 30 years ago  Smoking:  does not smoke, chews tobacco;  Diet:  regular diet;  Lifestyle:  married;  Exercise:  no regular exercise;  Occupation:  retired;  Residence:  lives with wife;   ____________________________ REVIEW OF SYSTEMS General:  malaise and fatigue  Integumentary:skin  cancers Ears, Nose, Throat, Mouth:  partial hearing loss Respiratory: see HPI Cardiovascular:  please review HPI Abdominal: denies dyspepsia, GI bleeding, constipation, or diarrhea Genitourinary-Male: frequency, terminal dribbling  Musculoskeletal:  severe chronic back pain, arthritis of the right shoulder, arthritis of the left knee Neurological:  unsteady gait, frequent falls  ____________________________ PHYSICAL EXAMINATION VITAL SIGNS  Blood Pressure:  164/70 Sitting, Right arm, regular cuff  , 168/78 Standing, Right arm and regular cuff   Pulse:  72/min. Weight:  160.00 lbs. Height:  72"BMI: 22  Constitutional:  pleasant white male in no acute distress, looks stated age Skin:  warm and dry to touch, no apparent skin lesions, or masses noted. Head:  normocephalic, balding male hair pattern Eyes:  EOMS Intact, PERRLA, C and S clear, Funduscopic exam not done. ENT:  hard of hearing, bilateral hearing aides present Neck:  supple, no masses, thyromegaly, JVD. Carotid pulses are full and equal bilaterally without bruits. Chest:  normal symmetry, clear to auscultation and percussion. Cardiac:  regular rhythm, normal S1 and S2, No S3 or S4, no murmurs, gallops or rubs detected. Peripheral Pulses:  the femoral,dorsalis pedis, and posterior tibial pulses are full and equal bilaterally with no bruits auscultated. Extremities & Back:  marked limitation of range of motion right shoulder, ulcer on anterior aspect of lower right leg, 2+ edema Neurological:  unsteady gait ____________________________ MOST RECENT LIPID PANEL 03/15/10  CHOL TOTL 131 mg/dl, LDL 64 NM, HDL 41 mg/dl, TRIGLYCER 161 mg/dl and CHOL/HDL 3.2 (Calc) ____________________________ IMPRESSIONS/PLAN  1. Continued peripheral edema which at this point looks like it may be noncardiac in Arjun. He could have cirrhosis that is contributing to this. 2. History of atrial fibrillation currently in sinus rhythm amiodarone stopped this  admission 3. Hypertensive heart disease 4. History of coronary artery disease mild  Recommendations:  He now has a new primary care physician. I recommended that he start furosemide 40 mg daily. He evidently is set up to have an abdominal ultrasound next week and we will await those results. I asked him to take furosemide twice daily over the weekend and then drop it down to 40 mg daily. He has been drinking a lot of Gatorade and I asked him to lay off of this for the time being because the sodium content. I will see him back in 2 weeks.  Because of the cirrhosis and varices, I stopped his amiodarone. ____________________________ TODAYS ORDERS  1. Return Visit: 2 weeks  2. 12 Lead EKG: Today                       ____________________________ Cardiology Physician:  Darden Palmer MD [facility]

## 2012-07-20 ENCOUNTER — Encounter: Payer: Self-pay | Admitting: Family Medicine

## 2012-07-20 DIAGNOSIS — E039 Hypothyroidism, unspecified: Secondary | ICD-10-CM | POA: Insufficient documentation

## 2012-07-20 DIAGNOSIS — R609 Edema, unspecified: Secondary | ICD-10-CM | POA: Insufficient documentation

## 2012-07-20 DIAGNOSIS — R600 Localized edema: Secondary | ICD-10-CM | POA: Insufficient documentation

## 2012-07-20 DIAGNOSIS — R1900 Intra-abdominal and pelvic swelling, mass and lump, unspecified site: Secondary | ICD-10-CM | POA: Insufficient documentation

## 2012-07-20 NOTE — Assessment & Plan Note (Signed)
Check TSH today

## 2012-07-20 NOTE — Assessment & Plan Note (Signed)
Regular rhythm currently. On amio, ASA, metopr, has cardiology f/u tomorrow.

## 2012-07-20 NOTE — Assessment & Plan Note (Signed)
Appears acute-on-chronic.  Suspect he has chronic LE venous insufficiency and some edema from portal hypertension from his alcoholic cirrhosis. Discussed basic therapeutic measures to diminish edema. Will check UA for protein, check CBC, CMET, PT/INR. Will order abd u/s now. Will consider diuretics when all labs/imaging is done.

## 2012-07-20 NOTE — Assessment & Plan Note (Addendum)
Abd u/s ordered today. Labs ordered as well. After labs/imaging results are in then we'll likely get him on lasix, propranolol, and aldactone plus get him referred to GI for consideration of EGD to further assess for varices.

## 2012-07-23 ENCOUNTER — Ambulatory Visit (HOSPITAL_BASED_OUTPATIENT_CLINIC_OR_DEPARTMENT_OTHER)
Admission: RE | Admit: 2012-07-23 | Discharge: 2012-07-23 | Disposition: A | Discharge status: Home / Self Care | Payer: Medicare PPO | Source: Ambulatory Visit | Attending: Family Medicine | Admitting: Family Medicine

## 2012-07-23 DIAGNOSIS — R141 Gas pain: Secondary | ICD-10-CM | POA: Insufficient documentation

## 2012-07-23 DIAGNOSIS — R609 Edema, unspecified: Secondary | ICD-10-CM | POA: Insufficient documentation

## 2012-07-23 DIAGNOSIS — R1900 Intra-abdominal and pelvic swelling, mass and lump, unspecified site: Secondary | ICD-10-CM | POA: Insufficient documentation

## 2012-07-23 DIAGNOSIS — R948 Abnormal results of function studies of other organs and systems: Secondary | ICD-10-CM | POA: Insufficient documentation

## 2012-07-23 DIAGNOSIS — K7030 Alcoholic cirrhosis of liver without ascites: Secondary | ICD-10-CM | POA: Insufficient documentation

## 2012-07-23 DIAGNOSIS — K219 Gastro-esophageal reflux disease without esophagitis: Secondary | ICD-10-CM | POA: Insufficient documentation

## 2012-07-23 DIAGNOSIS — I10 Essential (primary) hypertension: Secondary | ICD-10-CM | POA: Insufficient documentation

## 2012-07-23 DIAGNOSIS — I8510 Secondary esophageal varices without bleeding: Secondary | ICD-10-CM | POA: Insufficient documentation

## 2012-07-23 DIAGNOSIS — R142 Eructation: Secondary | ICD-10-CM | POA: Insufficient documentation

## 2012-07-25 ENCOUNTER — Encounter: Payer: Self-pay | Admitting: Family Medicine

## 2012-07-25 ENCOUNTER — Ambulatory Visit (INDEPENDENT_AMBULATORY_CARE_PROVIDER_SITE_OTHER): Payer: Medicare PPO | Admitting: Family Medicine

## 2012-07-25 VITALS — BP 140/76 | HR 64 | Ht 67.0 in | Wt 140.2 lb

## 2012-07-25 DIAGNOSIS — K7030 Alcoholic cirrhosis of liver without ascites: Secondary | ICD-10-CM

## 2012-07-25 DIAGNOSIS — K7460 Unspecified cirrhosis of liver: Secondary | ICD-10-CM

## 2012-07-25 DIAGNOSIS — D649 Anemia, unspecified: Secondary | ICD-10-CM

## 2012-07-25 DIAGNOSIS — I4891 Unspecified atrial fibrillation: Secondary | ICD-10-CM

## 2012-07-25 DIAGNOSIS — K7031 Alcoholic cirrhosis of liver with ascites: Secondary | ICD-10-CM

## 2012-07-25 DIAGNOSIS — R609 Edema, unspecified: Secondary | ICD-10-CM

## 2012-07-25 LAB — CBC WITH DIFFERENTIAL/PLATELET
Basophils Absolute: 0.1 10*3/uL (ref 0.0–0.1)
Basophils Relative: 2 % — ABNORMAL HIGH (ref 0–1)
Eosinophils Absolute: 0.4 10*3/uL (ref 0.0–0.7)
Eosinophils Relative: 7 % — ABNORMAL HIGH (ref 0–5)
HCT: 35.4 % — ABNORMAL LOW (ref 39.0–52.0)
Hemoglobin: 12.2 g/dL — ABNORMAL LOW (ref 13.0–17.0)
MCH: 30.9 pg (ref 26.0–34.0)
MCHC: 34.5 g/dL (ref 30.0–36.0)
MCV: 89.6 fL (ref 78.0–100.0)
Monocytes Absolute: 0.4 10*3/uL (ref 0.1–1.0)
Monocytes Relative: 9 % (ref 3–12)
RDW: 16.3 % — ABNORMAL HIGH (ref 11.5–15.5)

## 2012-07-25 LAB — COMPREHENSIVE METABOLIC PANEL
AST: 60 U/L — ABNORMAL HIGH (ref 0–37)
Albumin: 3.7 g/dL (ref 3.5–5.2)
Alkaline Phosphatase: 137 U/L — ABNORMAL HIGH (ref 39–117)
BUN: 24 mg/dL — ABNORMAL HIGH (ref 6–23)
Creat: 0.85 mg/dL (ref 0.50–1.35)
Glucose, Bld: 76 mg/dL (ref 70–99)

## 2012-07-25 LAB — FERRITIN: Ferritin: 97 ng/mL (ref 22–322)

## 2012-07-25 LAB — IBC PANEL
%SAT: 51 % (ref 20–55)
TIBC: 316 ug/dL (ref 215–435)
UIBC: 155 ug/dL (ref 125–400)

## 2012-07-25 NOTE — Progress Notes (Signed)
OFFICE NOTE  07/25/2012  CC:  Chief Complaint  Patient presents with  . Follow-up    edema     HPI: Patient is a 79 y.o. Caucasian male who is here with a male relative (not his daughter, [name]) for f/u edema. Recent u/s confirmed cirrhosis. Dr. Donnie Aho started him on lasix 40mg  qd 6 days ago, also stopped amiodarone b/c he has been in NSR.  He has diuresed very well--he is down 20 lbs from my visit with him on 07/18/12. He reports feeling fine.  Pertinent PMH:  Past Medical History  Diagnosis Date  . Arthritis   . Hyperlipidemia   . Hypertension   . A-fib     Dr Donnie Aho; WAS on Coumadin until 04/2012  . Angina     uses NTG prn  . Pneumonia     12 /2012  . Recurrent upper respiratory infection (URI)     03/2011  . Hypothyroidism     x 10 yrs  . GERD (gastroesophageal reflux disease)   . Constipation, chronic   . Depression   . History of dizziness   . Anxiety   . Urinary frequency   . Hearing loss of both ears     wears hearing aides  . BPH with obstruction/lower urinary tract symptoms     sees Dr Patsi Sears  . Compression fracture of thoracic vertebra, non-traumatic 06/14/12    T6 and T7 (noted on CT angio chest to r/o PE)  . Alcoholic cirrhosis of liver with ascites 06/14/12    Noted on CT angio chest to r/o PE  . Esophageal varices in alcoholic cirrhosis "   "    "   "    "    MEDS:  Outpatient Prescriptions Prior to Visit  Medication Sig Dispense Refill  . aspirin 81 MG tablet Take 81 mg by mouth daily.      . fenofibrate 160 MG tablet Take 160 mg by mouth daily.       . finasteride (PROSCAR) 5 MG tablet       . fish oil-omega-3 fatty acids 1000 MG capsule Take 1 g by mouth 2 (two) times daily.      Marland Kitchen HYDROcodone-acetaminophen (NORCO) 5-325 MG per tablet Take 1 tablet by mouth every 6 (six) hours as needed. For pain      . levothyroxine (SYNTHROID, LEVOTHROID) 100 MCG tablet Take 100 mcg by mouth daily.      Marland Kitchen LORazepam (ATIVAN) 0.5 MG tablet 1-2 tabs po  qhs prn  60 tablet  1  . meclizine (ANTIVERT) 25 MG tablet Take 25 mg by mouth 2 (two) times daily as needed. For dizziness       . metoprolol tartrate (LOPRESSOR) 25 MG tablet Take 25 mg by mouth 2 (two) times daily.       . mirtazapine (REMERON) 30 MG tablet Take 30 mg by mouth At bedtime.       . nitroGLYCERIN (NITROSTAT) 0.4 MG SL tablet Place 0.4 mg under the tongue every 5 (five) minutes as needed. For chest pain      . pantoprazole (PROTONIX) 40 MG tablet Take 40 mg by mouth daily.       . polyethylene glycol (MIRALAX / GLYCOLAX) packet Take 17 g by mouth 2 (two) times daily.       . pravastatin (PRAVACHOL) 40 MG tablet Take 80 mg by mouth daily.       Marland Kitchen amiodarone (PACERONE) 200 MG tablet Take 100 mg by mouth every  Monday, Wednesday, and Friday.        No facility-administered medications prior to visit.    PE: Blood pressure 140/76, pulse 64, height 5\' 7"  (1.702 m), weight 140 lb 4 oz (63.617 kg). Gen: Alert, well appearing.  Patient is oriented to person, place, time, and situation. CV: RRR LUNGS: CTA bilat, nonlabored resps ABD: soft, NT/ND, BS normal.  NO HSM or mass or bruit. EXT: 2+ pitting edema in both lower legs.  No skin breakdown or erythema.  IMPRESSION AND PLAN:  Alcoholic cirrhosis of liver with ascites Noted on CT scan 05/2012 (with esoph varices, as well).  History of heavy EtOH abuse 30 years ago, none since. Confirmed on recent abd u/s. Has responded very well to lasix 40 mg qd x 6d.  I told him to hold lasix until cr/lytes can be checked. Would like to start propranolol but pt already on Beta 1-specific med (metoprolol) for rate history of a-fib.   Will possibly need Dr. York Spaniel help with this quandry (digoxin? Back on amiodarone? ? Just switch from lopressor to propranolol?).  Will check CMET, hep B surf antigen, anti hep C antibody. Will get him back to the GI MD he has seen in the past for endoscopy, Dr. Matthias Hughs.  We talked to their office today and they  said they would call patient with appt info.   Atrial fibrillation Regular/sinus rhythm currently. Amiodarone recently d/c'd by Dr. Donnie Aho. Cont lopressor, ASA.  Peripheral edema Improved. Compression hose rx given again.   Continue Na limitation, elevation of legs.  We may need to decrease or hold lasix due to excessive diuresis on just 1 week of 40mg  qd.  Normocytic anemia Likely anemia of chronic dz. Will check iron labs and vit B12 level.   An After Visit Summary was printed and given to the patient.  FOLLOW UP: 2 wks

## 2012-07-25 NOTE — Patient Instructions (Addendum)
Hold your lasix (furosemide) until further instructions. Take rx to [facility] and get compression stockings.

## 2012-07-27 ENCOUNTER — Encounter: Payer: Self-pay | Admitting: Family Medicine

## 2012-07-27 DIAGNOSIS — D649 Anemia, unspecified: Secondary | ICD-10-CM | POA: Insufficient documentation

## 2012-07-27 NOTE — Assessment & Plan Note (Addendum)
Noted on CT scan 05/2012 (with esoph varices, as well).  History of heavy EtOH abuse 30 years ago, none since. Confirmed on recent abd u/s. Has responded very well to lasix 40 mg qd x 6d.  I told him to hold lasix until cr/lytes can be checked. Would like to start propranolol but pt already on Beta 1-specific med (metoprolol) for rate history of a-fib.   Will possibly need Dr. York Spaniel help with this quandry (digoxin? Back on amiodarone? ? Just switch from lopressor to propranolol?).  Will check CMET, hep B surf antigen, anti hep C antibody. Will get him back to the GI MD he has seen in the past for endoscopy, Dr. Matthias Hughs.  We talked to their office today and they said they would call patient with appt info.

## 2012-07-27 NOTE — Assessment & Plan Note (Addendum)
Regular/sinus rhythm currently. Amiodarone recently d/c'd by Dr. Donnie Aho. Cont lopressor, ASA.

## 2012-07-27 NOTE — Assessment & Plan Note (Signed)
Likely anemia of chronic dz. Will check iron labs and vit B12 level.

## 2012-07-27 NOTE — Assessment & Plan Note (Signed)
Improved. Compression hose rx given again.   Continue Na limitation, elevation of legs.  We may need to decrease or hold lasix due to excessive diuresis on just 1 week of 40mg  qd.

## 2012-08-08 ENCOUNTER — Encounter: Payer: Self-pay | Admitting: Family Medicine

## 2012-08-08 ENCOUNTER — Ambulatory Visit (INDEPENDENT_AMBULATORY_CARE_PROVIDER_SITE_OTHER): Payer: Medicare PPO | Admitting: Family Medicine

## 2012-08-08 VITALS — BP 118/58 | HR 64 | Temp 98.0°F | Ht 67.0 in | Wt 147.5 lb

## 2012-08-08 DIAGNOSIS — K7031 Alcoholic cirrhosis of liver with ascites: Secondary | ICD-10-CM

## 2012-08-08 DIAGNOSIS — K7030 Alcoholic cirrhosis of liver without ascites: Secondary | ICD-10-CM

## 2012-08-08 LAB — COMPREHENSIVE METABOLIC PANEL
AST: 58 U/L — ABNORMAL HIGH (ref 0–37)
Albumin: 3 g/dL — ABNORMAL LOW (ref 3.5–5.2)
Alkaline Phosphatase: 117 U/L (ref 39–117)
BUN: 18 mg/dL (ref 6–23)
Glucose, Bld: 94 mg/dL (ref 70–99)
Potassium: 3.9 mEq/L (ref 3.5–5.1)
Sodium: 139 mEq/L (ref 135–145)
Total Bilirubin: 1 mg/dL (ref 0.3–1.2)
Total Protein: 5.5 g/dL — ABNORMAL LOW (ref 6.0–8.3)

## 2012-08-10 ENCOUNTER — Other Ambulatory Visit: Payer: Self-pay | Admitting: Family Medicine

## 2012-08-10 ENCOUNTER — Encounter: Payer: Self-pay | Admitting: Family Medicine

## 2012-08-10 DIAGNOSIS — K7031 Alcoholic cirrhosis of liver with ascites: Secondary | ICD-10-CM

## 2012-08-10 MED ORDER — SPIRONOLACTONE 50 MG PO TABS: 50.0000 mg | ORAL_TABLET | Freq: Every day | ORAL | Status: DC

## 2012-08-10 NOTE — Progress Notes (Signed)
OFFICE NOTE 2  08/10/2012  CC:  Chief Complaint  Patient presents with  . Follow-up    cirrhosis and edema     HPI: Patient is a 79 y.o. Caucasian male who is here for 2 wk f/u cirrhosis with portal HTN, edema. He is feeling well.   Denies abd pain, fevers, or n/v.  Taking 1/2 lasix 40 mg tab qd since I last saw him. He questions whether or not GI needs to be involved at all.  He had trouble making an appt with Dr. Matthias Hughs, whom he had seen in the past. He wonders what the point of upper endoscopy is at this time.  Denies diarrhea, constipation, melena, or hematochezia.  Pertinent PMH:  Past Medical History  Diagnosis Date  . Arthritis   . Hyperlipidemia   . Hypertension   . A-fib     Dr Donnie Aho; WAS on Coumadin until 04/2012  . Angina     uses NTG prn  . Pneumonia     12 /2012  . Recurrent upper respiratory infection (URI)     03/2011  . Hypothyroidism     x 10 yrs  . GERD (gastroesophageal reflux disease)   . Constipation, chronic   . Depression   . History of dizziness   . Anxiety   . Urinary frequency   . Hearing loss of both ears     wears hearing aides  . BPH with obstruction/lower urinary tract symptoms     sees Dr Patsi Sears  . Compression fracture of thoracic vertebra, non-traumatic 06/14/12    T6 and T7 (noted on CT angio chest to r/o PE)  . Alcoholic cirrhosis of liver with ascites 06/14/12    Noted on CT angio chest to r/o PE  . Esophageal varices in alcoholic cirrhosis "   "    "   "    "   Past surgical, social, and family history reviewed and no changes noted since last office visit.  MEDS:  Outpatient Prescriptions Prior to Visit  Medication Sig Dispense Refill  . aspirin 81 MG tablet Take 81 mg by mouth daily.      . fenofibrate 160 MG tablet Take 160 mg by mouth daily.       . finasteride (PROSCAR) 5 MG tablet       . fish oil-omega-3 fatty acids 1000 MG capsule Take 1 g by mouth 2 (two) times daily.      . furosemide (LASIX) 40 MG tablet Take  1 tablet by mouth daily.      Marland Kitchen HYDROcodone-acetaminophen (NORCO) 5-325 MG per tablet Take 1 tablet by mouth every 6 (six) hours as needed. For pain      . levothyroxine (SYNTHROID, LEVOTHROID) 100 MCG tablet Take 100 mcg by mouth daily.      Marland Kitchen LORazepam (ATIVAN) 0.5 MG tablet 1-2 tabs po qhs prn  60 tablet  1  . meclizine (ANTIVERT) 25 MG tablet Take 25 mg by mouth 2 (two) times daily as needed. For dizziness       . metoprolol tartrate (LOPRESSOR) 25 MG tablet Take 25 mg by mouth 2 (two) times daily.       . mirtazapine (REMERON) 30 MG tablet Take 30 mg by mouth At bedtime.       . nitroGLYCERIN (NITROSTAT) 0.4 MG SL tablet Place 0.4 mg under the tongue every 5 (five) minutes as needed. For chest pain      . pantoprazole (PROTONIX) 40 MG tablet Take 40 mg  by mouth daily.       . polyethylene glycol (MIRALAX / GLYCOLAX) packet Take 17 g by mouth 2 (two) times daily.       . pravastatin (PRAVACHOL) 40 MG tablet Take 80 mg by mouth daily.        No facility-administered medications prior to visit.    PE: Blood pressure 118/58, pulse 64, temperature 98 F (36.7 C), height 5\' 7"  (1.702 m), weight 147 lb 8 oz (66.906 kg), SpO2 97.00%. Gen: Alert, well appearing.  Patient is oriented to person, place, time, and situation. ENT: no icterus SKIN: no jaundice or pallor CV: RRR LUNGS: CTA bilat ABD: soft, ND/NT, no organomegaly and no sign of ascites.  BS normal. EXT: no clubbing or cyanosis.  1+ pitting edema bilat.  No open wounds.  IMPRESSION AND PLAN:  1) Cirrhosis with portal HTN/esoph varices: pt feeling better with initial diuresis and continues to feel good even though he has gained some of that weight back.  From a fluid balance standpoint I think he is fine and I'm leaving him on 1/2 of a 40mg  lasix tab once daily.  Limitation of sodium intake has been reviewed/discussed.  I would still like to get Dr. Donavan Burnet input on things, esp regarding need for upper endoscopy---maybe this is not  essential since his esoph varices were viewed/diagnosed already by incidental finding on recent chest CT. Would also like guidance on whether or not he should be switched from lopressor to propranolol to attempt to minimize portal HTN/esoph varices growth and formation.   Will check CMET today, and if potassium is on the low side then I will likely start low dose aldactone (50 mg qd) to go with his lasix.  An After Visit Summary was printed and given to the patient.  Spent 30 min with pt today, with >50% of this time spent in counseling and care coordination regarding the above problems.  FOLLOW UP: 1 mo

## 2012-08-12 ENCOUNTER — Encounter: Payer: Self-pay | Admitting: *Deleted

## 2012-08-12 ENCOUNTER — Telehealth: Payer: Self-pay | Admitting: *Deleted

## 2012-08-12 NOTE — Telephone Encounter (Signed)
LMOM with contact name and number[ for return call, if needed] RE: GI appointment scheduled w/[facility] w/Dr. Matthias Hughs on Sep 03, 2012 at 4:15pm [[phone]] and informed may call office & change appt time/date , if needed/SLS

## 2012-08-21 ENCOUNTER — Other Ambulatory Visit (INDEPENDENT_AMBULATORY_CARE_PROVIDER_SITE_OTHER): Payer: Medicare PPO

## 2012-08-21 DIAGNOSIS — K7031 Alcoholic cirrhosis of liver with ascites: Secondary | ICD-10-CM

## 2012-08-21 DIAGNOSIS — K7030 Alcoholic cirrhosis of liver without ascites: Secondary | ICD-10-CM

## 2012-08-21 LAB — BASIC METABOLIC PANEL
BUN: 21 mg/dL (ref 6–23)
CO2: 26 mEq/L (ref 19–32)
Glucose, Bld: 85 mg/dL (ref 70–99)
Potassium: 4.8 mEq/L (ref 3.5–5.1)
Sodium: 135 mEq/L (ref 135–145)

## 2012-08-21 NOTE — Progress Notes (Signed)
Labs only

## 2012-08-23 ENCOUNTER — Encounter: Payer: Self-pay | Admitting: *Deleted

## 2012-09-06 ENCOUNTER — Ambulatory Visit: Payer: Medicare PPO | Admitting: Family Medicine

## 2012-09-06 DIAGNOSIS — Z0289 Encounter for other administrative examinations: Secondary | ICD-10-CM

## 2012-09-12 ENCOUNTER — Encounter: Payer: Self-pay | Admitting: Family Medicine

## 2012-09-12 ENCOUNTER — Ambulatory Visit (INDEPENDENT_AMBULATORY_CARE_PROVIDER_SITE_OTHER): Payer: Medicare PPO | Admitting: Family Medicine

## 2012-09-12 VITALS — BP 98/60 | HR 78 | Temp 98.0°F | Resp 14 | Wt 135.2 lb

## 2012-09-12 DIAGNOSIS — G8929 Other chronic pain: Secondary | ICD-10-CM

## 2012-09-12 DIAGNOSIS — I4891 Unspecified atrial fibrillation: Secondary | ICD-10-CM

## 2012-09-12 DIAGNOSIS — K7031 Alcoholic cirrhosis of liver with ascites: Secondary | ICD-10-CM

## 2012-09-12 DIAGNOSIS — K7460 Unspecified cirrhosis of liver: Secondary | ICD-10-CM

## 2012-09-12 DIAGNOSIS — K7030 Alcoholic cirrhosis of liver without ascites: Secondary | ICD-10-CM

## 2012-09-12 DIAGNOSIS — D649 Anemia, unspecified: Secondary | ICD-10-CM

## 2012-09-12 DIAGNOSIS — IMO0001 Reserved for inherently not codable concepts without codable children: Secondary | ICD-10-CM

## 2012-09-12 DIAGNOSIS — R609 Edema, unspecified: Secondary | ICD-10-CM

## 2012-09-12 LAB — BASIC METABOLIC PANEL WITH GFR
BUN: 28 mg/dL — ABNORMAL HIGH (ref 6–23)
CO2: 26 meq/L (ref 19–32)
Calcium: 9 mg/dL (ref 8.4–10.5)
Chloride: 109 meq/L (ref 96–112)
Creatinine, Ser: 1 mg/dL (ref 0.4–1.5)
GFR: 73.32 mL/min
Glucose, Bld: 80 mg/dL (ref 70–99)
Potassium: 4.3 meq/L (ref 3.5–5.1)
Sodium: 139 meq/L (ref 135–145)

## 2012-09-12 MED ORDER — FUROSEMIDE 40 MG PO TABS: ORAL_TABLET | ORAL | Status: DC

## 2012-09-12 MED ORDER — HYDROCODONE-ACETAMINOPHEN 5-325 MG PO TABS: 1.0000 | ORAL_TABLET | Freq: Four times a day (QID) | ORAL | Status: DC | PRN

## 2012-09-12 NOTE — Progress Notes (Signed)
OFFICE NOTE  09/12/2012  CC:  Chief Complaint  Patient presents with  . Follow-up    Cirrhosis of Liver; Pt request to have Potassium checked [04.30.14 BMET Normal]   . Back Pain    Pt c/o "arthritic pain" on Right side of Loer back.     HPI: Patient is a 79 y.o. Caucasian male who is here for f/u alc cirrhosis with varices.   Says he's feeling well except for chronic pain in various regions of his back, esp after he works around house or in yard, mostly towards end of day. Says he was on vicodin in the relatively recent past--prn use helped.  NSAIDs have been avoided due to being on coumadin until recently, also + hx gross hematuria on anticoagulants.  Has still been on aldactone and lasix daily. Wt is down.  Appetite and food intake good.  Pertinent PMH:  Past Medical History  Diagnosis Date  . Arthritis   . Hyperlipidemia   . Hypertension   . A-fib     Dr Donnie Aho; WAS on Coumadin until 04/2012  . Angina     uses NTG prn  . Pneumonia     12 /2012  . Recurrent upper respiratory infection (URI)     03/2011  . Hypothyroidism     x 10 yrs  . GERD (gastroesophageal reflux disease)   . Constipation, chronic   . Depression   . History of dizziness   . Anxiety   . Urinary frequency   . Hearing loss of both ears     wears hearing aides  . BPH with obstruction/lower urinary tract symptoms     sees Dr Patsi Sears  . Compression fracture of thoracic vertebra, non-traumatic 06/14/12    T6 and T7 (noted on CT angio chest to r/o PE)  . Alcoholic cirrhosis of liver with ascites 06/14/12    Noted on CT angio chest to r/o PE  . Esophageal varices in alcoholic cirrhosis "   "    "   "    "   Past surgical, social, and family history reviewed and no changes noted since last office visit.  MEDS:  Outpatient Prescriptions Prior to Visit  Medication Sig Dispense Refill  . aspirin 81 MG tablet Take 81 mg by mouth daily.      . fenofibrate 160 MG tablet Take 160 mg by mouth daily.        . finasteride (PROSCAR) 5 MG tablet       . fish oil-omega-3 fatty acids 1000 MG capsule Take 1 g by mouth 2 (two) times daily.      . furosemide (LASIX) 40 MG tablet Take 1 tablet by mouth daily.      Marland Kitchen HYDROcodone-acetaminophen (NORCO) 5-325 MG per tablet Take 1 tablet by mouth every 6 (six) hours as needed. For pain      . levothyroxine (SYNTHROID, LEVOTHROID) 100 MCG tablet Take 100 mcg by mouth daily.      Marland Kitchen LORazepam (ATIVAN) 0.5 MG tablet 1-2 tabs po qhs prn  60 tablet  1  . meclizine (ANTIVERT) 25 MG tablet Take 25 mg by mouth 2 (two) times daily as needed. For dizziness       . metoprolol tartrate (LOPRESSOR) 25 MG tablet Take 25 mg by mouth 2 (two) times daily.       . mirtazapine (REMERON) 30 MG tablet Take 30 mg by mouth At bedtime.       . nitroGLYCERIN (NITROSTAT) 0.4 MG SL tablet  Place 0.4 mg under the tongue every 5 (five) minutes as needed. For chest pain      . pantoprazole (PROTONIX) 40 MG tablet Take 40 mg by mouth daily.       . polyethylene glycol (MIRALAX / GLYCOLAX) packet Take 17 g by mouth 2 (two) times daily.       . pravastatin (PRAVACHOL) 40 MG tablet Take 80 mg by mouth daily.       Marland Kitchen spironolactone (ALDACTONE) 50 MG tablet Take 1 tablet (50 mg total) by mouth daily.  30 tablet  1   No facility-administered medications prior to visit.    PE: Blood pressure 98/60, pulse 78, temperature 98 F (36.7 C), temperature source Oral, resp. rate 14, weight 135 lb 4 oz (61.349 kg), SpO2 96.00%. Wt down 12 lbs since visit here 1 mo ago. Gen: Alert, well appearing.  Patient is oriented to person, place, time, and situation. CV: RRR LUNGS: CTA bilat, nonlabored resps. Abd: soft, NT/ND EXT: trace LE pitting edema. BACK: no bony tenderness.  IMPRESSION AND PLAN:  Alcoholic cirrhosis of liver with ascites He has been over-diuresed.  Check BMET today. D/C aldactone. Change lasix to PRN use--if wt gets up above 145-147 lb range. Saw Dr. Matthias Hughs, GI MD, no EGD  recommended but he did recommend changing him to a B1/B2 blocker like propranolol to diminish risk of growth/bleeding of his esoph varices (switch from metoprolol). Will do this but will run it by his cardiologist, Dr. Donnie Aho, first.  Chronic musculoskeletal pain Mostly his back. Will restart prn vicodin as he had been doing well on before w/out overuse or adverse side effect.   An After Visit Summary was printed and given to the patient.  FOLLOW UP: 72mo

## 2012-09-22 ENCOUNTER — Encounter: Payer: Self-pay | Admitting: Family Medicine

## 2012-09-22 DIAGNOSIS — G8929 Other chronic pain: Secondary | ICD-10-CM | POA: Insufficient documentation

## 2012-09-22 NOTE — Assessment & Plan Note (Signed)
Mostly his back. Will restart prn vicodin as he had been doing well on before w/out overuse or adverse side effect.

## 2012-09-22 NOTE — Assessment & Plan Note (Addendum)
He has been over-diuresed.  Check BMET today. D/C aldactone. Change lasix to PRN use--if wt gets up above 145-147 lb range. Saw Dr. Matthias Hughs, GI MD, no EGD recommended but he did recommend changing him to a B1/B2 blocker like propranolol to diminish risk of growth/bleeding of his esoph varices (switch from metoprolol). Will do this but will run it by his cardiologist, Dr. Donnie Aho, first.

## 2012-09-25 ENCOUNTER — Telehealth: Payer: Self-pay | Admitting: Family Medicine

## 2012-09-25 ENCOUNTER — Telehealth: Payer: Self-pay | Admitting: *Deleted

## 2012-09-25 MED ORDER — PRAVASTATIN SODIUM 40 MG PO TABS: 80.0000 mg | ORAL_TABLET | Freq: Every day | ORAL | Status: DC

## 2012-09-25 MED ORDER — NADOLOL 20 MG PO TABS: 10.0000 mg | ORAL_TABLET | Freq: Every day | ORAL | Status: DC

## 2012-09-25 NOTE — Telephone Encounter (Signed)
Pt is having trouble with [facility] mail order pharmacy and request short-supply to local parmacy: [facility]; Rx request to pharmacy/SLS

## 2012-09-25 NOTE — Telephone Encounter (Signed)
Pls call pt and tell him I have talked to Dr. Donnie Aho, his cardiologist, and he says it is fine to change his Toprol XL to a different med in order to help his esophageal varices. Make sure he understands that I want him to STOP Toprol XL (metoprolol succinate) and start a new med called nadolol in it's place.  He'll take this med every day and it will continue to help his heart as well as his esophageal varices.  Also, make sure he stopped taking the aldactone after last visit and make sure he stopped the lasix pill that he was taking every day.  He is to restart taking his lasix only if his weight gets above the 145-147 lb range.   Pls send rx for nadolol 10mg , 1 tab po qd, #30, RF x 1-to his pharmacy.  Keep f/u set for July--thx

## 2012-09-25 NOTE — Telephone Encounter (Signed)
Patient informed, understood & agreed, was writing information down; but still seemed a bit confused. LMOM with contact name and number for return call for daughter [name] to reiterate provider instructions for patient/SLS

## 2012-09-26 NOTE — Telephone Encounter (Signed)
Spoke with pt's daughter and reiterated provider information RE: medication changes; understood & agreed/SLS

## 2012-10-07 ENCOUNTER — Telehealth: Payer: Self-pay | Admitting: *Deleted

## 2012-10-07 NOTE — Telephone Encounter (Signed)
Raymond Massed, MD at 09/25/2012 4:46 PM Pls call pt and tell him I have talked to Dr. Donnie Aho, his cardiologist, and he says it is fine to change his Toprol XL to a different med in order to help his esophageal varices.  Make sure he understands that I want him to STOP Toprol XL (metoprolol succinate) and start a new med called nadolol in it's place. He'll take this med every day and it will continue to help his heart as well as his esophageal varices. Also, make sure he stopped taking the aldactone after last visit and make sure he stopped the lasix pill that he was taking every day. He is to restart taking his lasix only if his weight gets above the 145-147 lb range.  Pls send rx for nadolol 10mg , 1 tab po qd, #30, RF x 1-to his pharmacy. Keep f/u set for July--thx Raymond Larsen, CMA at 09/26/2012 10:02 AM Spoke with pt's daughter and reiterated provider information RE: medication changes; understood & agreed/SLS  Pt's daughter called and reports that she is "very concerned about her father d/t extreme fatigue & nausea since medication change" and would just feel better knowing why his medication change was made; reiterated conversation noted above on 06.05.14. Request to either have pt come in to office earlier than scheduled 07.21.14  F/U appt and/or for labs/SLS Please advise.

## 2012-10-07 NOTE — Telephone Encounter (Signed)
I called Raymond Larsen back at 5:25pm today but had to leave VM message to call me back at the office tomorrow (10/08/12).

## 2012-10-15 ENCOUNTER — Other Ambulatory Visit: Payer: Self-pay | Admitting: Family Medicine

## 2012-10-15 MED ORDER — NADOLOL 20 MG PO TABS: 10.0000 mg | ORAL_TABLET | Freq: Every day | ORAL | Status: DC

## 2012-10-15 NOTE — Telephone Encounter (Signed)
Patients daughter, Park Meo called wanting to figure out how to change [facility].  She stated in message that new meds must be called in by physician.  LMOM for pt to return call to sort this out matter.

## 2012-10-15 NOTE — Addendum Note (Signed)
Addended by: Eulah Pont on: 10/15/2012 04:11 PM   Modules accepted: Orders

## 2012-10-15 NOTE — Telephone Encounter (Signed)
Pt needed to get [facility]s sent to [facility]

## 2012-11-01 ENCOUNTER — Encounter: Payer: Self-pay | Admitting: Cardiology

## 2012-11-01 NOTE — Progress Notes (Signed)
Patient ID: Raymond Hy Sr., male   DOB: [birth year], 79 y.o.   MRN: [id number]   Raymond Larsen, [place]  Date of visit:  10/31/2012 DOB:  1923/03/25    Age:  79 yrs. Medical record number:  [id number]     Account number:  [id number] Primary Care Provider: Michell Heinrich ____________________________ CURRENT DIAGNOSES  1. CAD,Native  2. Arrhythmia-Atrial Fibrillation  3. Hypertensive Heart Disease-Benign without CHF  4. Hyperlipidemia  5. Chronic Liver Disease And Cirrhosis  6. Edema  7. Dyspnea  8. Hypothyroidism,unspecified ____________________________ ALLERGIES  Amitriptyline, Intolerance-dizziness  Beta-Adrenergic Blocking Agents, Fatigue  Isosorbide, Headache  Niacin, Rash  Sulfa (Sulfonamides), Rash ____________________________ MEDICATIONS  1. Miralax 17 gram/dose Powder, 1 p.o. daily  2. pravastatin 40 mg Tablet, 2 p.o. daily  3. nitroglycerin 0.4 mg Tablet, Sublingual, PRN  4. mirtazapine 30 mg Tablet, QHS  5. hydrocodone-acetaminophen 7.5-325 mg Tablet, PRN  6. Fish Oil 1,000 mg Capsule, qd  7. Tylenol 325 mg tablet, PRN  8. Vitamin B-12 1,000 mcg tablet, 1500mg  qd  9. meclizine 25 mg tablet, PRN  10. levothyroxine 100 mcg tablet, 1 p.o. daily  11. Aspirin Low-Strength 81 mg tablet,chewable, one daily  12. pantoprazole 40 mg tablet,delayed release (DR/EC), 1 p.o. daily  13. finasteride 5 mg tablet, 1 p.o. daily  14. furosemide 40 mg tablet, 1 qd if wt over 145 lbs  15. lorazepam 1 mg tablet, QHS  16. nadolol 20 mg tablet, 1/2 tab daily  17. fenofibrate 54 mg tablet, 1 p.o. daily ____________________________ CHIEF COMPLAINTS  Edema penis and testicles  Followup of Arrhythmia-Atrial Fibrillation  Followup of CAD,Native ____________________________ HISTORY OF PRESENT ILLNESS  Patient seen for cardiac followup. He has been diagnosed with cirrhosis and now his daughter is living with him in the home full time. He still has edema involving his scrotum as well as his lower legs  but it appears to be better now. His weight is somewhat up. He has had no recurrence of atrial fibrillation. He evidently was changed from metoprolol to nadolol and is feeling worse since this was changed by his primary doctor. He has no PND or orthopnea. He is now 79. He has had no recurrence of bleeding at this time. ____________________________ PAST HISTORY  Past Medical Illnesses:  hypertension, obesity, gout, hypothyroidism, lumbar disc disease, GERD, renal insufficiency, osteoarthritis, BPH, cirrohsis;  Cardiovascular Illnesses:  atrial fibrillation, cardiomyopathy(dilated);  Surgical Procedures:  shoulder repair-rt, right shoulder replacement, knee replacement-rt, TURP, herniorrhaphy, cholecystectomy (lap), inguinal herniorrhaphy-rt 2013;  Cardiology Procedures-Invasive:  cardiac cath (left) June 2004, cardioversion October 2004;  Cardiology Procedures-Noninvasive:  adenosine cardiolite April 2008, echocardiogram December 2007;  Cardiac Cath Results:  normal Left main, scattered irregularities, scattered irregularities LAD, scattered irregularities CFX, scattered irregularities RCA;  LVEF of 60% documented via echocardiogram on 06/12/2012,  CHADS Score:  2,  CHA2DS2-VASC Score:  3 ____________________________ CARDIO-PULMONARY TEST DATES EKG Date:  07/19/2012;   Cardiac Cath Date:  10/08/2002;  Nuclear Study Date:  08/16/2006;  Echocardiography Date: 06/12/2012;  Chest Xray Date: 06/11/2012;  CT Scan Date:  06/13/2012   ____________________________ FAMILY HISTORY Brother -- Cancer, Deceased Brother -- Brother alive and well Brother -- CVA, Deceased Father -- Alcoholism, Deceased Mother -- CVA, Deceased Sister -- Sister alive and well Sister -- Unknown Disease, Deceased Sister -- Sister alive and well ____________________________ SOCIAL HISTORY Alcohol Use:  does not use alcohol;  Smoking:  does not smoke, chews tobacco;  Diet:  regular diet;  Lifestyle:  married;  Exercise:  no regular  exercise;  Occupation:  retired;  Residence:  lives with wife;   ____________________________ REVIEW OF SYSTEMS General:  malaise and fatigue  Integumentary:skin cancers Ears, Nose, Throat, Mouth:  partial hearing loss Respiratory: denies dyspnea, cough, wheezing or hemoptysis. Cardiovascular:  please review HPI  Genitourinary-Male: frequency, terminal dribbling  Musculoskeletal:  severe chronic back pain, arthritis of the right shoulder, arthritis of the left knee Neurological:  unsteady gait, frequent falls  ____________________________ PHYSICAL EXAMINATION VITAL SIGNS  Blood Pressure:  124/70 Sitting, Right arm, regular cuff   Pulse:  74/min. Weight:  149.50 lbs. Height:  72"BMI: 20  Constitutional:  pleasant white male in no acute distress, looks stated age Head:  normocephalic, balding male hair pattern Eyes:  EOMS Intact, PERRLA, C and S clear, Funduscopic exam not done. ENT:  hard of hearing, bilateral hearing aides present Chest:  normal symmetry, clear to auscultation and percussion. Cardiac:  regular rhythm, normal S1 and S2, No S3 or S4, no murmurs, gallops or rubs detected. Peripheral Pulses:  the femoral,dorsalis pedis, and posterior tibial pulses are full and equal bilaterally with no bruits auscultated. Extremities & Back:  marked limitation of range of motion right shoulder, 1+ edema Neurological:  unsteady gait ____________________________ MOST RECENT LIPID PANEL 03/15/10  CHOL TOTL 131 mg/dl, LDL 64 NM, HDL 41 mg/dl, TRIGLYCER 725 mg/dl and CHOL/HDL 3.2 (Calc) ____________________________ IMPRESSIONS/PLAN  1. History of atrial fibrillation clinically in sinus rhythm 2. Cirrhosis of the liver 3. History of hypertension 4. History of renal insufficiency  Recommendations:  It would be reasonable for him to go back on metoprolol as he is feeling badly on the nadolol. He has significant cirrhosis with esophageal varices. Evidently there are no plans to do endoscopy at  this time. I will see him in followup and 3 months. ____________________________ TODAYS ORDERS  1. Return Visit: 3 months                       ____________________________ Cardiology Physician:  Darden Palmer MD [facility]

## 2012-11-04 ENCOUNTER — Telehealth: Payer: Self-pay | Admitting: Family Medicine

## 2012-11-04 ENCOUNTER — Other Ambulatory Visit: Payer: Self-pay | Admitting: Family Medicine

## 2012-11-04 MED ORDER — PRAVASTATIN SODIUM 40 MG PO TABS: 80.0000 mg | ORAL_TABLET | Freq: Every day | ORAL | Status: DC

## 2012-11-04 NOTE — Telephone Encounter (Signed)
Pharmacy request for spironolactone 50mg .  I don't see where patient is on this medication.  Please advise refill.

## 2012-11-04 NOTE — Telephone Encounter (Signed)
Yes, he was on this med at one point in time but I advised him to STOP it at last visit about 2 mo ago. This could just be an automated pharmacy RF request, but pls call pt's daughter [name], number should be in chart) and make sure he has not been taking this med still.  --thx

## 2012-11-04 NOTE — Telephone Encounter (Signed)
[facility]ed [facility]

## 2012-11-11 ENCOUNTER — Encounter: Payer: Self-pay | Admitting: Family Medicine

## 2012-11-11 ENCOUNTER — Ambulatory Visit (INDEPENDENT_AMBULATORY_CARE_PROVIDER_SITE_OTHER): Payer: Medicare PPO | Admitting: Family Medicine

## 2012-11-11 VITALS — BP 156/77 | HR 68 | Temp 98.6°F | Resp 18 | Ht 67.0 in | Wt 154.0 lb

## 2012-11-11 DIAGNOSIS — M1710 Unilateral primary osteoarthritis, unspecified knee: Secondary | ICD-10-CM

## 2012-11-11 DIAGNOSIS — R609 Edema, unspecified: Secondary | ICD-10-CM

## 2012-11-11 DIAGNOSIS — M1712 Unilateral primary osteoarthritis, left knee: Secondary | ICD-10-CM

## 2012-11-11 DIAGNOSIS — K7031 Alcoholic cirrhosis of liver with ascites: Secondary | ICD-10-CM

## 2012-11-11 DIAGNOSIS — K7460 Unspecified cirrhosis of liver: Secondary | ICD-10-CM

## 2012-11-11 DIAGNOSIS — I4891 Unspecified atrial fibrillation: Secondary | ICD-10-CM

## 2012-11-11 DIAGNOSIS — K7030 Alcoholic cirrhosis of liver without ascites: Secondary | ICD-10-CM

## 2012-11-11 DIAGNOSIS — D649 Anemia, unspecified: Secondary | ICD-10-CM

## 2012-11-11 MED ORDER — FUROSEMIDE 40 MG PO TABS: ORAL_TABLET | ORAL | Status: DC

## 2012-11-11 MED ORDER — METHYLPREDNISOLONE ACETATE 40 MG/ML IJ SUSP
40.0000 mg | Freq: Once | INTRAMUSCULAR | Status: AC
  Administered 2012-11-11: 40 mg via INTRA_ARTICULAR

## 2012-11-11 MED ORDER — SPIRONOLACTONE 25 MG PO TABS: 25.0000 mg | ORAL_TABLET | Freq: Two times a day (BID) | ORAL | Status: DC

## 2012-11-11 NOTE — Progress Notes (Signed)
OFFICE NOTE  11/11/2012  CC:  Chief Complaint  Patient presents with  . Fall    fell on Saturday  . Leg Swelling  . Follow-up     HPI: Patient is a 79 y.o. Caucasian male who is here for leg swelling. Started back on fluid pill about 2 wks ago b/c wt was back up to 151 lbs and legs were swelling back up again. C/o left knee buckling quite a bit more lately.  He fell 3 n/a and couldn't get up--down approx a few hours.  Knee hurts right now. Has felt bad since getting on nadalol so we'll get him back on metoprolol.  Eating and drinking well. Takes stool softener nightly and bowels are moving well.   Pertinent PMH:  Past Medical History  Diagnosis Date  . Arthritis   . Hyperlipidemia   . Hypertension   . A-fib     Dr Donnie Aho; WAS on Coumadin until 04/2012  . Angina     uses NTG prn  . Pneumonia     12 /2012  . Recurrent upper respiratory infection (URI)     03/2011  . Hypothyroidism     x 10 yrs  . GERD (gastroesophageal reflux disease)   . Constipation, chronic   . Depression   . History of dizziness   . Anxiety   . Urinary frequency   . Hearing loss of both ears     wears hearing aides  . BPH with obstruction/lower urinary tract symptoms     sees Dr Patsi Sears  . Compression fracture of thoracic vertebra, non-traumatic 06/14/12    T6 and T7 (noted on CT angio chest to r/o PE)  . Alcoholic cirrhosis of liver with ascites 06/14/12    Noted on CT angio chest to r/o PE  . Esophageal varices in alcoholic cirrhosis "   "    "   "    "   Past Surgical History  Procedure Laterality Date  . Total knee arthroplasty  1994    right  . Total shoulder replacement  1997    right  . Joint replacement      Rt knee and shoulder  . Hernia repair  1990    LIH repair  . Cholecystectomy  2004  . Inguinal hernia repair  05/09/2011    Procedure: HERNIA REPAIR INGUINAL ADULT;  Surgeon: Adolph Pollack, MD;  Location: [facility] OR;  Service: General;  Laterality: Right;    MEDS:   Outpatient Prescriptions Prior to Visit  Medication Sig Dispense Refill  . aspirin 81 MG tablet Take 81 mg by mouth daily.      . fenofibrate 160 MG tablet Take 160 mg by mouth daily.       . finasteride (PROSCAR) 5 MG tablet       . fish oil-omega-3 fatty acids 1000 MG capsule Take 1 g by mouth 2 (two) times daily.      Marland Kitchen HYDROcodone-acetaminophen (NORCO/VICODIN) 5-325 MG per tablet Take 1 tablet by mouth every 6 (six) hours as needed. For pain  60 tablet  1  . levothyroxine (SYNTHROID, LEVOTHROID) 100 MCG tablet Take 100 mcg by mouth daily.      Marland Kitchen LORazepam (ATIVAN) 0.5 MG tablet 1-2 tabs po qhs prn  60 tablet  1  . meclizine (ANTIVERT) 25 MG tablet Take 25 mg by mouth 2 (two) times daily as needed. For dizziness       . mirtazapine (REMERON) 30 MG tablet Take 30 mg by  mouth At bedtime.       . nadolol (CORGARD) 20 MG tablet Take 0.5 tablets (10 mg total) by mouth daily.  90 tablet  0  . nitroGLYCERIN (NITROSTAT) 0.4 MG SL tablet Place 0.4 mg under the tongue every 5 (five) minutes as needed. For chest pain      . pantoprazole (PROTONIX) 40 MG tablet Take 40 mg by mouth daily.       . polyethylene glycol (MIRALAX / GLYCOLAX) packet Take 17 g by mouth 2 (two) times daily.       . pravastatin (PRAVACHOL) 40 MG tablet Take 2 tablets (80 mg total) by mouth daily.  90 tablet  0  . furosemide (LASIX) 40 MG tablet 1/2-1 tab po qd AS NEEDED for increased fluid retention/swelling  30 tablet  1   No facility-administered medications prior to visit.    PE: Blood pressure 156/77, pulse 68, temperature 98.6 F (37 C), temperature source Temporal, resp. rate 18, height 5\' 7"  (1.702 m), weight 154 lb (69.854 kg), SpO2 97.00%. Gen: Alert, well appearing.  Patient is oriented to person, place, time, and situation. CV: RRR Lungs - Normal respiratory effort, chest expands symmetrically. Lungs are clear to auscultation, no crackles or wheezes. EXT: 3+ bilat LE pitting edema without erythema or skin  breakdown. Left knee with moderate effusion, with mild diffuse warmth but no erythema.  No signif tenderness.  ROM slightly diminished in flexion.  IMPRESSION AND PLAN:  1) Cirrhosis with portal HTN and peripheral edema: his wt is up 19 lbs since last visit. His estimated euvolemic wt is about 142-145 lbs. Will increase lasix to 40mg  bid and restart aldactone at 25mg  bid.  2) left knee strain + osteoarthritis:  Procedure: Therapeutic left knee injection.  The patient's clinical condition is marked by substantial pain and/or significant functional disability.  Other conservative therapy has not provided relief, is contraindicated, or not appropriate.  There is a reasonable likelihood that injection will significantly improve the patient's pain and/or functional disability. Cleaned skin with alcohol swab, used anterior approach with patient sitting up and knee flexed at 90 degrees to enter knee joint, Injected 1ml of 40mg /ml depomedrol plus 2 ml of 1% lidocaine w/out epi into joint space without resistance.  No immediate complications.  Patient tolerated procedure well.  Post-injection care discussed, including 20 min of icing 1-2 times in the next 4-8 hours, frequent non weight-bearing ROM exercises over the next few days, and general pain medication management.  3) Debilitation: HH nursing ordered.  FOLLOW UP: 1 wk

## 2012-11-20 ENCOUNTER — Ambulatory Visit (INDEPENDENT_AMBULATORY_CARE_PROVIDER_SITE_OTHER): Payer: Medicare PPO | Admitting: Family Medicine

## 2012-11-20 ENCOUNTER — Other Ambulatory Visit: Payer: Self-pay | Admitting: Family Medicine

## 2012-11-20 ENCOUNTER — Encounter: Payer: Self-pay | Admitting: Family Medicine

## 2012-11-20 VITALS — BP 124/70 | HR 97 | Temp 98.4°F | Ht 67.0 in | Wt 137.0 lb

## 2012-11-20 DIAGNOSIS — K7030 Alcoholic cirrhosis of liver without ascites: Secondary | ICD-10-CM

## 2012-11-20 DIAGNOSIS — K7031 Alcoholic cirrhosis of liver with ascites: Secondary | ICD-10-CM

## 2012-11-20 DIAGNOSIS — K7460 Unspecified cirrhosis of liver: Secondary | ICD-10-CM

## 2012-11-20 DIAGNOSIS — R609 Edema, unspecified: Secondary | ICD-10-CM

## 2012-11-20 DIAGNOSIS — N410 Acute prostatitis: Secondary | ICD-10-CM | POA: Insufficient documentation

## 2012-11-20 DIAGNOSIS — I4891 Unspecified atrial fibrillation: Secondary | ICD-10-CM

## 2012-11-20 DIAGNOSIS — D649 Anemia, unspecified: Secondary | ICD-10-CM

## 2012-11-20 DIAGNOSIS — K766 Portal hypertension: Secondary | ICD-10-CM

## 2012-11-20 LAB — COMPREHENSIVE METABOLIC PANEL
AST: 47 U/L — ABNORMAL HIGH (ref 0–37)
Albumin: 3.2 g/dL — ABNORMAL LOW (ref 3.5–5.2)
Alkaline Phosphatase: 122 U/L — ABNORMAL HIGH (ref 39–117)
BUN: 19 mg/dL (ref 6–23)
Creatinine, Ser: 0.9 mg/dL (ref 0.4–1.5)
Glucose, Bld: 83 mg/dL (ref 70–99)
Total Bilirubin: 1.5 mg/dL — ABNORMAL HIGH (ref 0.3–1.2)

## 2012-11-20 MED ORDER — CIPROFLOXACIN HCL 500 MG PO TABS: 500.0000 mg | ORAL_TABLET | Freq: Two times a day (BID) | ORAL | Status: DC

## 2012-11-20 MED ORDER — FUROSEMIDE 40 MG PO TABS: ORAL_TABLET | ORAL | Status: DC

## 2012-11-20 MED ORDER — MECLIZINE HCL 25 MG PO TABS: 25.0000 mg | ORAL_TABLET | Freq: Two times a day (BID) | ORAL | Status: DC | PRN

## 2012-11-20 MED ORDER — SPIRONOLACTONE 25 MG PO TABS: ORAL_TABLET | ORAL | Status: DC

## 2012-11-20 NOTE — Progress Notes (Signed)
OFFICE NOTE  11/20/2012  CC: No chief complaint on file.    HPI: Patient is a 79 y.o. Caucasian male who is here for confusion and cloudy urine. Last o/v 9 days ago we increased his lasix to 40mg  bid and restarted aldactone 25mg  bid---due to a reaccumulation of LE edema secondary to his portal HTN from cirrhosis; also did steroid injection in left knee for acute worsening of her osteoarthritis.  [facility] nursing consult has been done and PT is being arranged as well as a swallowing study due to some coughing and occ mild trouble swallowing.  Daughter says he doesn't wear his dentures much and tends to sometimes try eating bites that are too big for him.  Since I saw him he has had increased urinary frequency as expected on increased diuretics but not significant dysuria.  He has had some urinary incontinence lately.  Daughter says urine was cloudy, heavy odor, and saw a few blood clots in urine yesterday.  He says his prostate region has felt sore some. He had a slight period of confusion a couple of nights ago per daughter but this has cleared up.  Pertinent PMH:  Past Medical History  Diagnosis Date  . Arthritis   . Hyperlipidemia   . Hypertension   . A-fib     Dr Donnie Aho; WAS on Coumadin until 04/2012  . Angina     uses NTG prn  . Pneumonia     12 /2012  . Recurrent upper respiratory infection (URI)     03/2011  . Hypothyroidism     x 10 yrs  . GERD (gastroesophageal reflux disease)   . Constipation, chronic   . Depression   . History of dizziness   . Anxiety   . Urinary frequency   . Hearing loss of both ears     wears hearing aides  . BPH with obstruction/lower urinary tract symptoms     sees Dr Patsi Sears  . Compression fracture of thoracic vertebra, non-traumatic 06/14/12    T6 and T7 (noted on CT angio chest to r/o PE)  . Alcoholic cirrhosis of liver with ascites 06/14/12    Noted on CT angio chest to r/o PE  . Esophageal varices in alcoholic cirrhosis "   "    "   "    "    Past Surgical History  Procedure Laterality Date  . Total knee arthroplasty  1994    right  . Total shoulder replacement  1997    right  . Joint replacement      Rt knee and shoulder  . Hernia repair  1990    LIH repair  . Cholecystectomy  2004  . Inguinal hernia repair  05/09/2011    Procedure: HERNIA REPAIR INGUINAL ADULT;  Surgeon: Adolph Pollack, MD;  Location: [facility] OR;  Service: General;  Laterality: Right;    MEDS:  Outpatient Prescriptions Prior to Visit  Medication Sig Dispense Refill  . aspirin 81 MG tablet Take 81 mg by mouth daily.      . fenofibrate 160 MG tablet Take 160 mg by mouth daily.       . finasteride (PROSCAR) 5 MG tablet       . fish oil-omega-3 fatty acids 1000 MG capsule Take 1 g by mouth 2 (two) times daily.      Marland Kitchen HYDROcodone-acetaminophen (NORCO/VICODIN) 5-325 MG per tablet Take 1 tablet by mouth every 6 (six) hours as needed. For pain  60 tablet  1  .  levothyroxine (SYNTHROID, LEVOTHROID) 100 MCG tablet Take 100 mcg by mouth daily.      Marland Kitchen LORazepam (ATIVAN) 0.5 MG tablet 1-2 tabs po qhs prn  60 tablet  1  . meclizine (ANTIVERT) 25 MG tablet Take 25 mg by mouth 2 (two) times daily as needed. For dizziness       . mirtazapine (REMERON) 30 MG tablet Take 30 mg by mouth At bedtime.       . nadolol (CORGARD) 20 MG tablet Take 0.5 tablets (10 mg total) by mouth daily.  90 tablet  0  . nitroGLYCERIN (NITROSTAT) 0.4 MG SL tablet Place 0.4 mg under the tongue every 5 (five) minutes as needed. For chest pain      . pantoprazole (PROTONIX) 40 MG tablet Take 40 mg by mouth daily.       . polyethylene glycol (MIRALAX / GLYCOLAX) packet Take 17 g by mouth 2 (two) times daily.       . pravastatin (PRAVACHOL) 40 MG tablet Take 2 tablets (80 mg total) by mouth daily.  90 tablet  0  . spironolactone (ALDACTONE) 25 MG tablet Take 1 tablet (25 mg total) by mouth 2 (two) times daily.  60 tablet  3  . furosemide (LASIX) 40 MG tablet 1-2 po bid AS NEEDED for increased  fluid retention/swelling  60 tablet  1   No facility-administered medications prior to visit.    PE: Blood pressure 124/70, pulse 97, temperature 98.4 F (36.9 C), temperature source Oral, height 5\' 7"  (1.702 m), weight 137 lb (62.143 kg), SpO2 97.00%. Gen: Alert, well appearing.  Patient is oriented to person, place, time, and situation. Using rolling walker well today. No icteris or jaundice.  ABD: soft, nondistended. DRE: normal rectal tone.  No mass palpable, no stool in rectal vault.  Prostate is not enlarged but is diffusely "bumpy" and indurated and tender to palpation.  No distinct large nodule or asymmetry is palpable.    LABS: CC UA here today is normal except large bilirubin.  IMPRESSION AND PLAN:  Prostatitis, acute Cipro 500 mg bid x 14d.   Liver cirrhosis With portal HTN. He is overdiuresed now. Decrease lasix back to 40mg  qod and aldactone 25mg  qod. Check lytes/cr today.   An After Visit Summary was printed and given to the patient.  FOLLOW UP: 2 wks.

## 2012-11-20 NOTE — Assessment & Plan Note (Signed)
Cipro 500 mg bid x 14d.

## 2012-11-20 NOTE — Assessment & Plan Note (Signed)
With portal HTN. He is overdiuresed now. Decrease lasix back to 40mg  qod and aldactone 25mg  qod. Check lytes/cr today.

## 2012-11-20 NOTE — Patient Instructions (Signed)
Take one 40mg  lasix tab every other day and one 25mg  spironolactone once every other day.

## 2012-11-22 DIAGNOSIS — K7030 Alcoholic cirrhosis of liver without ascites: Secondary | ICD-10-CM

## 2012-11-22 DIAGNOSIS — I4891 Unspecified atrial fibrillation: Secondary | ICD-10-CM

## 2012-11-22 DIAGNOSIS — R609 Edema, unspecified: Secondary | ICD-10-CM

## 2012-11-22 DIAGNOSIS — M1710 Unilateral primary osteoarthritis, unspecified knee: Secondary | ICD-10-CM

## 2012-11-28 ENCOUNTER — Telehealth: Payer: Self-pay | Admitting: Family Medicine

## 2012-11-28 NOTE — Telephone Encounter (Signed)
Needs to take his lasix and aldactone EVERY OTHER DAY. May take robitussin DM for cough.  If having fevers or shortness of breath then needs to have o/v. Make sure he is not taking any of his meds on an empty stomach.  No meds for nausea b/c they will oversedate him.  If he is vomiting then he needs to be seen in office or emergency department.-thx

## 2012-11-28 NOTE — Telephone Encounter (Signed)
[facility] reporting that patient is cough up medium amount of thick white sputum and has some fluid in lungs.  Patient also complains of nausea (possibly from a recent cataract removal)   Patient also confused about how to take medication furosemide and sprinolactone.  Should he just take every other day when he has fluid build up or continually.  Please advise.

## 2012-11-29 NOTE — Telephone Encounter (Signed)
Patient's daughter, [name] aware.

## 2012-12-04 ENCOUNTER — Encounter: Payer: Self-pay | Admitting: Family Medicine

## 2012-12-04 ENCOUNTER — Ambulatory Visit (INDEPENDENT_AMBULATORY_CARE_PROVIDER_SITE_OTHER): Payer: Medicare PPO | Admitting: Family Medicine

## 2012-12-04 VITALS — BP 138/75 | HR 65 | Temp 98.6°F | Resp 18 | Ht 67.0 in | Wt 146.0 lb

## 2012-12-04 DIAGNOSIS — N410 Acute prostatitis: Secondary | ICD-10-CM

## 2012-12-04 DIAGNOSIS — K7030 Alcoholic cirrhosis of liver without ascites: Secondary | ICD-10-CM

## 2012-12-04 DIAGNOSIS — I4891 Unspecified atrial fibrillation: Secondary | ICD-10-CM

## 2012-12-04 DIAGNOSIS — K7031 Alcoholic cirrhosis of liver with ascites: Secondary | ICD-10-CM

## 2012-12-04 MED ORDER — METOPROLOL TARTRATE 25 MG PO TABS: 25.0000 mg | ORAL_TABLET | Freq: Two times a day (BID) | ORAL | Status: DC

## 2012-12-04 NOTE — Progress Notes (Signed)
OFFICE NOTE  12/04/2012  CC:  Chief Complaint  Patient presents with  . Cirrhosis    follow up  . Abdominal Pain    after eating Low abd hurts.     HPI: Patient is a 79 y.o. Caucasian male who is here for 2 wk f/u acute prostatitis as well as f/u cirrhosis with portal HTN/peripheral edema.  He is alone in exam room but his daughter is out in the car with his wife who needs 24/7 supervision.  We cut back on his lasix and aldactone last visit due to overdiuresis: shooting for about the 145 lb range as ideal wt.   I put him on a 2 wk course of cipro for his prostatitis.  He feels better now, just some infrequent night time lower abdomen pain, resolves in 1-2 hours.    ROS: no fevers, no dysuria, no hematuria, no jaundice.   Pertinent PMH:  Past Medical History  Diagnosis Date  . Arthritis   . Hyperlipidemia   . Hypertension   . A-fib     Dr Donnie Aho; WAS on Coumadin until 04/2012  . Angina     uses NTG prn  . Pneumonia     12 /2012  . Recurrent upper respiratory infection (URI)     03/2011  . Hypothyroidism     x 10 yrs  . GERD (gastroesophageal reflux disease)   . Constipation, chronic   . Depression   . History of dizziness   . Anxiety   . Urinary frequency   . Hearing loss of both ears     wears hearing aides  . BPH with obstruction/lower urinary tract symptoms     sees Dr Patsi Sears  . Compression fracture of thoracic vertebra, non-traumatic 06/14/12    T6 and T7 (noted on CT angio chest to r/o PE)  . Alcoholic cirrhosis of liver with ascites 06/14/12    Noted on CT angio chest to r/o PE  . Esophageal varices in alcoholic cirrhosis "   "    "   "    "    MEDS:  Outpatient Prescriptions Prior to Visit  Medication Sig Dispense Refill  . aspirin 81 MG tablet Take 81 mg by mouth daily.      . ciprofloxacin (CIPRO) 500 MG tablet Take 1 tablet (500 mg total) by mouth 2 (two) times daily.  28 tablet  0  . fenofibrate 160 MG tablet Take 160 mg by mouth daily.       .  finasteride (PROSCAR) 5 MG tablet       . fish oil-omega-3 fatty acids 1000 MG capsule Take 1 g by mouth 2 (two) times daily.      . furosemide (LASIX) 40 MG tablet 1 tab po qod  60 tablet  1  . HYDROcodone-acetaminophen (NORCO/VICODIN) 5-325 MG per tablet Take 1 tablet by mouth every 6 (six) hours as needed. For pain  60 tablet  1  . levothyroxine (SYNTHROID, LEVOTHROID) 100 MCG tablet Take 100 mcg by mouth daily.      Marland Kitchen LORazepam (ATIVAN) 0.5 MG tablet 1-2 tabs po qhs prn  60 tablet  1  . meclizine (ANTIVERT) 25 MG tablet Take 1 tablet (25 mg total) by mouth 2 (two) times daily as needed. For dizziness  30 tablet  6  . mirtazapine (REMERON) 30 MG tablet Take 30 mg by mouth At bedtime.       . nadolol (CORGARD) 20 MG tablet Take 0.5 tablets (10 mg total)  by mouth daily.  90 tablet  0  . nitroGLYCERIN (NITROSTAT) 0.4 MG SL tablet Place 0.4 mg under the tongue every 5 (five) minutes as needed. For chest pain      . pantoprazole (PROTONIX) 40 MG tablet Take 40 mg by mouth daily.       . polyethylene glycol (MIRALAX / GLYCOLAX) packet Take 17 g by mouth 2 (two) times daily.       . pravastatin (PRAVACHOL) 40 MG tablet Take 2 tablets (80 mg total) by mouth daily.  90 tablet  0  . spironolactone (ALDACTONE) 25 MG tablet 1 tab po qod  60 tablet  3   No facility-administered medications prior to visit.    PE: Blood pressure 138/75, pulse 65, temperature 98.6 F (37 C), temperature source Temporal, resp. rate 18, height 5\' 7"  (1.702 m), weight 146 lb (66.225 kg), SpO2 98.00%.  Wt is up 9 lbs since last visit. Gen: Alert, well appearing.  Patient is oriented to person, place, time, and situation. CV: RRR, distant S1 and S2, rate about 65.  No rub or gallop. LUNGS: CTA bilat except for a hint of early inspiratory soft crackles in both bases.  Nonlabored resps. ABD: soft, NT/ND, BS normal.   EXT: Pitting edema from knees down to feet, right side 1+, left side 2+  IMPRESSION AND PLAN:  Alcoholic  cirrhosis of liver with ascites + varices. Continue lopressor (pt did not tolerate trial of nonspecific beta blocker). Continue aldactone 25mg  qod and lasix 40mg  qod. Check CBC and CMET at f/u visit in 2 mo.  Prostatitis, acute Resolving appropriately with abx.  Atrial fibrillation Coumadin stopped 2014 due to excessive urinary bleeding. Distant S1 and S2, but sounds like he may be in sinus rhythm today. Continue beta blocker.   An After Visit Summary was printed and given to the patient.  FOLLOW UP: 2 mo

## 2012-12-25 ENCOUNTER — Other Ambulatory Visit: Payer: Self-pay | Admitting: *Deleted

## 2012-12-25 MED ORDER — PRAVASTATIN SODIUM 40 MG PO TABS: 80.0000 mg | ORAL_TABLET | Freq: Every day | ORAL | Status: DC

## 2012-12-26 ENCOUNTER — Telehealth: Payer: Self-pay | Admitting: *Deleted

## 2012-12-26 NOTE — Telephone Encounter (Signed)
[facility] called requesting PT be stopped for patient. Please advise?

## 2012-12-26 NOTE — Telephone Encounter (Signed)
OK to d/c PT.

## 2013-01-09 NOTE — Assessment & Plan Note (Signed)
Coumadin stopped 2014 due to excessive urinary bleeding. Distant S1 and S2, but sounds like he may be in sinus rhythm today. Continue beta blocker.

## 2013-01-09 NOTE — Assessment & Plan Note (Addendum)
+   varices. Continue lopressor (pt did not tolerate trial of nonspecific beta blocker). Continue aldactone 25mg  qod and lasix 40mg  qod. Check CBC and CMET at f/u visit in 2 mo.

## 2013-01-09 NOTE — Assessment & Plan Note (Signed)
Resolving appropriately with abx.

## 2013-01-17 ENCOUNTER — Telehealth: Payer: Self-pay | Admitting: Family Medicine

## 2013-01-17 MED ORDER — HYDROCODONE-ACETAMINOPHEN 5-325 MG PO TABS: 1.0000 | ORAL_TABLET | Freq: Four times a day (QID) | ORAL | Status: DC | PRN

## 2013-01-17 NOTE — Telephone Encounter (Signed)
Vicodin rx printed. 

## 2013-01-17 NOTE — Telephone Encounter (Signed)
Medication faxed to pharmacy 

## 2013-01-17 NOTE — Telephone Encounter (Signed)
Patient requesting refill on hydrocodone.  Last seen in office on 12/04/12.  Rx last printed on 09/12/12 x 1 refill.  Please advise refill.

## 2013-01-22 ENCOUNTER — Ambulatory Visit (INDEPENDENT_AMBULATORY_CARE_PROVIDER_SITE_OTHER): Payer: Medicare PPO | Admitting: Family Medicine

## 2013-01-22 ENCOUNTER — Encounter: Payer: Self-pay | Admitting: Family Medicine

## 2013-01-22 VITALS — BP 124/50 | HR 65 | Temp 98.9°F | Ht 67.0 in | Wt 138.5 lb

## 2013-01-22 DIAGNOSIS — N410 Acute prostatitis: Secondary | ICD-10-CM

## 2013-01-22 DIAGNOSIS — J989 Respiratory disorder, unspecified: Secondary | ICD-10-CM

## 2013-01-22 MED ORDER — PREDNISONE 20 MG PO TABS: ORAL_TABLET | ORAL | Status: DC

## 2013-01-22 MED ORDER — AMOXICILLIN-POT CLAVULANATE 875-125 MG PO TABS: 1.0000 | ORAL_TABLET | Freq: Two times a day (BID) | ORAL | Status: DC

## 2013-01-22 NOTE — Progress Notes (Signed)
OFFICE NOTE  01/22/2013  CC:  Chief Complaint  Patient presents with  . Cough    chest congestion      HPI: Patient is a 79 y.o. Caucasian male who is here for URI sx's, cough, not feeling well. Three wks hx of nasal cong/runny nose, coughing, worse the last week.  ?Subjective fevers, nauseated lately, very fatigued.  No vomiting. Drinking sweet tea mostly, occ water.  No urinary complaints.  Last BM was last night.  Takes stool softener regularly. Denies CP or SOB.  Pertinent PMH:  Past Medical History  Diagnosis Date  . Arthritis   . Hyperlipidemia   . Hypertension   . A-fib     Dr Donnie Aho; WAS on Coumadin until 04/2012  . Angina     uses NTG prn  . Pneumonia     12 /2012  . Recurrent upper respiratory infection (URI)     03/2011  . Hypothyroidism     x 10 yrs  . GERD (gastroesophageal reflux disease)   . Constipation, chronic   . Depression   . History of dizziness   . Anxiety   . Urinary frequency   . Hearing loss of both ears     wears hearing aides  . BPH with obstruction/lower urinary tract symptoms     sees Dr Patsi Sears  . Compression fracture of thoracic vertebra, non-traumatic 06/14/12    T6 and T7 (noted on CT angio chest to r/o PE)  . Alcoholic cirrhosis of liver with ascites 06/14/12    Noted on CT angio chest to r/o PE  . Esophageal varices in alcoholic cirrhosis "   "    "   "    "    MEDS:  Outpatient Prescriptions Prior to Visit  Medication Sig Dispense Refill  . aspirin 81 MG tablet Take 81 mg by mouth daily.      . fenofibrate 160 MG tablet Take 160 mg by mouth daily.       . finasteride (PROSCAR) 5 MG tablet       . fish oil-omega-3 fatty acids 1000 MG capsule Take 1 g by mouth 2 (two) times daily.      . furosemide (LASIX) 40 MG tablet 1 tab po qod  60 tablet  1  . HYDROcodone-acetaminophen (NORCO/VICODIN) 5-325 MG per tablet Take 1 tablet by mouth every 6 (six) hours as needed. For pain  60 tablet  1  . levothyroxine (SYNTHROID,  LEVOTHROID) 100 MCG tablet Take 100 mcg by mouth daily.      Marland Kitchen LORazepam (ATIVAN) 0.5 MG tablet 1-2 tabs po qhs prn  60 tablet  1  . meclizine (ANTIVERT) 25 MG tablet Take 1 tablet (25 mg total) by mouth 2 (two) times daily as needed. For dizziness  30 tablet  6  . metoprolol tartrate (LOPRESSOR) 25 MG tablet Take 1 tablet (25 mg total) by mouth 2 (two) times daily.  60 tablet  5  . mirtazapine (REMERON) 30 MG tablet Take 30 mg by mouth At bedtime.       . nitroGLYCERIN (NITROSTAT) 0.4 MG SL tablet Place 0.4 mg under the tongue every 5 (five) minutes as needed. For chest pain      . pantoprazole (PROTONIX) 40 MG tablet Take 40 mg by mouth daily.       . polyethylene glycol (MIRALAX / GLYCOLAX) packet Take 17 g by mouth 2 (two) times daily.       . pravastatin (PRAVACHOL) 40 MG tablet Take  2 tablets (80 mg total) by mouth daily.  90 tablet  1  . spironolactone (ALDACTONE) 25 MG tablet 1 tab po qod  60 tablet  3   No facility-administered medications prior to visit.    PE: Blood pressure 124/50, pulse 65, temperature 98.9 F (37.2 C), temperature source Oral, height 5\' 7"  (1.702 m), weight 138 lb 8 oz (62.823 kg), SpO2 97.00%. Gen: Alert, tired and frail appearing but NAD.  Patient is oriented to person, place, time, and situation. ENT: ears clear, Nose clear, oropharynx moist and pink--no swelling/exudate/erythema. Neck: supple, no LAD. CV: irreg irreg, rate about 80 LUNGS: CTA bilat, nonlabored resps.  Exp phase not prolonged.  Aeration is decent.  IMPRESSION AND PLAN:  Prolonged URI, with bronchitis/possible LRTI. Empiric augmentin 875mg  bid x 10d. Prednisone 40mg  qd x 5d, then 20mg  qd x 5d.  FOLLOW UP: 5-7d

## 2013-01-27 ENCOUNTER — Encounter: Payer: Self-pay | Admitting: Family Medicine

## 2013-01-27 ENCOUNTER — Ambulatory Visit (INDEPENDENT_AMBULATORY_CARE_PROVIDER_SITE_OTHER): Payer: Medicare PPO | Admitting: Family Medicine

## 2013-01-27 VITALS — BP 127/71 | HR 66 | Temp 99.1°F | Resp 18 | Ht 67.0 in | Wt 141.0 lb

## 2013-01-27 DIAGNOSIS — J989 Respiratory disorder, unspecified: Secondary | ICD-10-CM

## 2013-01-27 DIAGNOSIS — K4090 Unilateral inguinal hernia, without obstruction or gangrene, not specified as recurrent: Secondary | ICD-10-CM

## 2013-01-27 DIAGNOSIS — Z23 Encounter for immunization: Secondary | ICD-10-CM

## 2013-01-27 NOTE — Progress Notes (Signed)
OFFICE NOTE  01/27/2013  CC:  Chief Complaint  Patient presents with  . Follow-up     HPI: Patient is a 79 y.o. Caucasian male who is here for 5d f/u for respiratory illness for which I rx'd him augmentin and a 10d prednisone taper. Feels a bit better, energy level still low.  Taking fluids fine.  Eating very well (ate pizza yesterday).  No fevers. Says about 3 wks ago he was raking leaves and felt like he made his prev hernia on right side come out again.   Pertinent PMH:  Past Medical History  Diagnosis Date  . Arthritis   . Hyperlipidemia   . Hypertension   . A-fib     Dr Donnie Aho; WAS on Coumadin until 04/2012  . Angina     uses NTG prn  . Pneumonia     12 /2012  . Recurrent upper respiratory infection (URI)     03/2011  . Hypothyroidism     x 10 yrs  . GERD (gastroesophageal reflux disease)   . Constipation, chronic   . Depression   . History of dizziness   . Anxiety   . Urinary frequency   . Hearing loss of both ears     wears hearing aides  . BPH with obstruction/lower urinary tract symptoms     sees Dr Patsi Sears  . Compression fracture of thoracic vertebra, non-traumatic 06/14/12    T6 and T7 (noted on CT angio chest to r/o PE)  . Alcoholic cirrhosis of liver with ascites 06/14/12    Noted on CT angio chest to r/o PE  . Esophageal varices in alcoholic cirrhosis "   "    "   "    "   Past surgical, social, and family history reviewed and no changes noted since last office visit.  MEDS:  Outpatient Prescriptions Prior to Visit  Medication Sig Dispense Refill  . amoxicillin-clavulanate (AUGMENTIN) 875-125 MG per tablet Take 1 tablet by mouth 2 (two) times daily.  20 tablet  0  . aspirin 81 MG tablet Take 81 mg by mouth daily.      Marland Kitchen BESIVANCE 0.6 % SUSP       . DUREZOL 0.05 % EMUL       . fenofibrate 160 MG tablet Take 160 mg by mouth daily.       . finasteride (PROSCAR) 5 MG tablet       . fish oil-omega-3 fatty acids 1000 MG capsule Take 1 g by mouth 2  (two) times daily.      . furosemide (LASIX) 40 MG tablet 1 tab po qod  60 tablet  1  . HYDROcodone-acetaminophen (NORCO/VICODIN) 5-325 MG per tablet Take 1 tablet by mouth every 6 (six) hours as needed. For pain  60 tablet  1  . ILEVRO 0.3 % SUSP       . levothyroxine (SYNTHROID, LEVOTHROID) 100 MCG tablet Take 100 mcg by mouth daily.      Marland Kitchen LORazepam (ATIVAN) 0.5 MG tablet 1-2 tabs po qhs prn  60 tablet  1  . meclizine (ANTIVERT) 25 MG tablet Take 1 tablet (25 mg total) by mouth 2 (two) times daily as needed. For dizziness  30 tablet  6  . metoprolol tartrate (LOPRESSOR) 25 MG tablet Take 1 tablet (25 mg total) by mouth 2 (two) times daily.  60 tablet  5  . mirtazapine (REMERON) 30 MG tablet Take 30 mg by mouth At bedtime.       Marland Kitchen  nitroGLYCERIN (NITROSTAT) 0.4 MG SL tablet Place 0.4 mg under the tongue every 5 (five) minutes as needed. For chest pain      . pantoprazole (PROTONIX) 40 MG tablet Take 40 mg by mouth daily.       . polyethylene glycol (MIRALAX / GLYCOLAX) packet Take 17 g by mouth 2 (two) times daily.       . pravastatin (PRAVACHOL) 40 MG tablet Take 2 tablets (80 mg total) by mouth daily.  90 tablet  1  . predniSONE (DELTASONE) 20 MG tablet 2 tabs po qd x 5d, then 1 tab po qd x 5d  15 tablet  0  . spironolactone (ALDACTONE) 25 MG tablet 1 tab po qod  60 tablet  3   No facility-administered medications prior to visit.    PE: Blood pressure 127/71, pulse 66, temperature 99.1 F (37.3 C), temperature source Temporal, resp. rate 18, height 5\' 7"  (1.702 m), weight 141 lb (63.957 kg), SpO2 97.00%. Gen: frail, elderly, thin white male in NAD.  Alert, oriented x 4. CV: irreg irreg, rate about 80s, no m/r/g Chest is clear, no wheezing or rales. Normal symmetric air entry throughout both lung fields. No chest wall deformities or tenderness. ABD: right groin with palpable, nontender swelling c/w direct hernia--I cannot reduce this today but he says he can reduce it commonly when lying  on his back in bed at night.  IMPRESSION AND PLAN:  1) Acute bronchitis/bronchopneumonia: improving on abx and steroids.  Continue this/finish these meds.  2) Right inguinal hernia, at the site of previous hernia repair per pt.: he'll call his gen surg to get this re-evaluated.  Flu vaccine IM today.  FOLLOW UP: 2 mo

## 2013-01-27 NOTE — Addendum Note (Signed)
Addended by: Eulah Pont on: 01/27/2013 02:27 PM   Modules accepted: Orders

## 2013-02-03 ENCOUNTER — Ambulatory Visit: Payer: Medicare PPO | Admitting: Family Medicine

## 2013-02-06 ENCOUNTER — Encounter: Payer: Self-pay | Admitting: Family Medicine

## 2013-02-06 ENCOUNTER — Telehealth: Payer: Self-pay | Admitting: Family Medicine

## 2013-02-06 NOTE — Telephone Encounter (Signed)
Needs to make an office visit.-thx

## 2013-02-06 NOTE — Telephone Encounter (Signed)
Patient is still coughing a lot.  Can you send in prednisone for patient or should he make an office visit.  Please advise.

## 2013-02-06 NOTE — Telephone Encounter (Signed)
Noted.  I still need to see the patient in order to determine the best way to treat him.

## 2013-02-06 NOTE — Telephone Encounter (Signed)
Please advise 

## 2013-02-06 NOTE — Telephone Encounter (Signed)
Can you please call and schedule OV.

## 2013-02-06 NOTE — Telephone Encounter (Signed)
SW Melanie (patient's caregiver). Patient is refusing to leave the house. He is very weak and tired. Patient did not sleep all night last night due to coughing, fell this morning and would not go to the hospital. Caregiver called EMS & they came & checked the patient out (he hit his chest when he fell). EMS said patient checked out okay & they left. I've given the caregiver the times available for appts, she will try to convince patient to come.

## 2013-02-07 ENCOUNTER — Ambulatory Visit: Payer: Medicare PPO | Admitting: Family Medicine

## 2013-02-07 VITALS — BP 106/60 | HR 75 | Resp 16

## 2013-02-07 DIAGNOSIS — I4891 Unspecified atrial fibrillation: Secondary | ICD-10-CM

## 2013-02-07 DIAGNOSIS — R609 Edema, unspecified: Secondary | ICD-10-CM

## 2013-02-07 DIAGNOSIS — J209 Acute bronchitis, unspecified: Secondary | ICD-10-CM

## 2013-02-09 ENCOUNTER — Encounter: Payer: Self-pay | Admitting: Family Medicine

## 2013-02-09 DIAGNOSIS — J209 Acute bronchitis, unspecified: Secondary | ICD-10-CM | POA: Insufficient documentation

## 2013-02-09 NOTE — Assessment & Plan Note (Signed)
Portal HTN + venous insufficiency. Stable.  Continue aldactone and lasix qod.

## 2013-02-09 NOTE — Assessment & Plan Note (Signed)
His sx's are waxing/waning---nothing too severe but he is brittle. I'll try another round of prednisone: 40 mg qd x 4d, then 20 mg qd x 4d, then 10mg  qd x 4d. Also azithromycin 250mg , 2 tabs qd x 1d, then 1 tab qd x 4d. He'll continue mucinex DM prn.  He is too frail/too high of a fall risk so I want to avoid hydrocodone cough syrup. Signs/symptoms to call or return for were reviewed and pt expressed understanding.

## 2013-02-09 NOTE — Progress Notes (Signed)
OFFICE NOTE  02/09/2013  CC: No chief complaint on file.    HPI: Patient is a 79 y.o. Caucasian male whom I did a house-call visit for today. Ongoing coughing, esp at night, chest hurts in anterior region when coughing, also a bit of tenderness in anterior rib cage. No SOB at rest but gets a bit winded with walking, relying on walker 24/7 right now since he has been with this illness the last few weeks. No fevers, no wheezing.  He has a rattle in chest, says phlegm is mildly thick and a bit yellow tinted.  No hemoptysis. Appetite fair.  Fatigued lately.  No exertional chest pains. He has already been through a round of steroids and augmentin for this illness---has never really completely perked up and gotten over it. He called yesterday/earlier today and asked for med to help but refused to come into office, citing weakness and fear of getting worse from getting out of the house.    He was getting out of his bathroom recently and his foot caught on a smal area rug and he fell and hit his arm on bathtub.  No other area of his body hit anything.  He has a bruise and apparently a small skin tear that his caregiver has wrapped in sterile gauze.  Pt says the arm does not hurt currently and he has had full use of his arm/hand since then (right arm).  At the time of the incident, his caregiver called 911 and they assessed him and found him to be fine.  Caregivers wanted him to go to the ED but he refused.  Pertinent PMH:  Past Medical History  Diagnosis Date  . Arthritis   . Hyperlipidemia   . Hypertension   . A-fib     Dr Donnie Aho; WAS on Coumadin until 04/2012  . Angina     uses NTG prn  . Pneumonia     12 /2012  . Recurrent upper respiratory infection (URI)     03/2011  . Hypothyroidism     x 10 yrs  . GERD (gastroesophageal reflux disease)   . Constipation, chronic   . Depression   . History of dizziness   . Anxiety   . Urinary frequency   . Hearing loss of both ears     wears  hearing aides  . BPH with obstruction/lower urinary tract symptoms     sees Dr Patsi Sears  . Compression fracture of thoracic vertebra, non-traumatic 06/14/12    T6 and T7 (noted on CT angio chest to r/o PE)  . Alcoholic cirrhosis of liver with ascites 06/14/12    Noted on CT angio chest to r/o PE  . Esophageal varices in alcoholic cirrhosis "   "    "   "    "    MEDS:  Outpatient Prescriptions Prior to Visit  Medication Sig Dispense Refill  . amoxicillin-clavulanate (AUGMENTIN) 875-125 MG per tablet Take 1 tablet by mouth 2 (two) times daily.  20 tablet  0  . aspirin 81 MG tablet Take 81 mg by mouth daily.      Marland Kitchen BESIVANCE 0.6 % SUSP       . DUREZOL 0.05 % EMUL       . fenofibrate 160 MG tablet Take 160 mg by mouth daily.       . finasteride (PROSCAR) 5 MG tablet       . fish oil-omega-3 fatty acids 1000 MG capsule Take 1 g by mouth 2 (two)  times daily.      . furosemide (LASIX) 40 MG tablet 1 tab po qod  60 tablet  1  . HYDROcodone-acetaminophen (NORCO/VICODIN) 5-325 MG per tablet Take 1 tablet by mouth every 6 (six) hours as needed. For pain  60 tablet  1  . ILEVRO 0.3 % SUSP       . levothyroxine (SYNTHROID, LEVOTHROID) 100 MCG tablet Take 100 mcg by mouth daily.      Marland Kitchen LORazepam (ATIVAN) 0.5 MG tablet 1-2 tabs po qhs prn  60 tablet  1  . meclizine (ANTIVERT) 25 MG tablet Take 1 tablet (25 mg total) by mouth 2 (two) times daily as needed. For dizziness  30 tablet  6  . metoprolol tartrate (LOPRESSOR) 25 MG tablet Take 1 tablet (25 mg total) by mouth 2 (two) times daily.  60 tablet  5  . mirtazapine (REMERON) 30 MG tablet Take 30 mg by mouth At bedtime.       . nitroGLYCERIN (NITROSTAT) 0.4 MG SL tablet Place 0.4 mg under the tongue every 5 (five) minutes as needed. For chest pain      . pantoprazole (PROTONIX) 40 MG tablet Take 40 mg by mouth daily.       . polyethylene glycol (MIRALAX / GLYCOLAX) packet Take 17 g by mouth 2 (two) times daily.       . pravastatin (PRAVACHOL) 40 MG  tablet Take 2 tablets (80 mg total) by mouth daily.  90 tablet  1  . predniSONE (DELTASONE) 20 MG tablet 2 tabs po qd x 5d, then 1 tab po qd x 5d  15 tablet  0  . spironolactone (ALDACTONE) 25 MG tablet 1 tab po qod  60 tablet  3   No facility-administered medications prior to visit.    PE: Blood pressure 106/60, pulse 75, resp. rate 16. Gen: alert, oriented x 4.  NAD. Frail, eldery man, uses walker to ambulate. No icterus.  Oral mucosa pink/moist.  No pharyngeal erythema, exudate, or swelling. Neck: no swelling or tenderness or LAD. No supraclavicular LAD. CV: irreg irreg, rate about 75.  Distant S1 and S2, without audible murmur/rub/gallop. LUNGS: CTA bilat, nonlabored resps, good aeration. Chest wall: tenderness at sternocostal junction bilat from sternal notch level down to xyphoid level. ABD: no distention, no tenderness or mass. EXT: 2+ pitting edema below the knees bilat  IMPRESSION AND PLAN:  Acute bronchitis His sx's are waxing/waning---nothing too severe but he is brittle. I'll try another round of prednisone: 40 mg qd x 4d, then 20 mg qd x 4d, then 10mg  qd x 4d. Also azithromycin 250mg , 2 tabs qd x 1d, then 1 tab qd x 4d. He'll continue mucinex DM prn.  He is too frail/too high of a fall risk so I want to avoid hydrocodone cough syrup. Signs/symptoms to call or return for were reviewed and pt expressed understanding.   Atrial fibrillation Coumadin stopped 2014 due to excessive urinary bleeding. Rate pretty well controlled on lopressor 25mg  bid.    Peripheral edema Portal HTN + venous insufficiency. Stable.  Continue aldactone and lasix qod.   An After Visit Summary was printed and given to the patient.  Spent 30 min with pt today, with >50% of this time spent in counseling and care coordination regarding the above problems.  FOLLOW UP: prn

## 2013-02-09 NOTE — Assessment & Plan Note (Signed)
Coumadin stopped 2014 due to excessive urinary bleeding. Rate pretty well controlled on lopressor 25mg  bid.

## 2013-02-14 ENCOUNTER — Telehealth: Payer: Self-pay | Admitting: Family Medicine

## 2013-02-14 MED ORDER — HYDROCODONE-ACETAMINOPHEN 5-325 MG PO TABS: 1.0000 | ORAL_TABLET | Freq: Four times a day (QID) | ORAL | Status: DC | PRN

## 2013-02-14 NOTE — Telephone Encounter (Signed)
Vicodin rx printed. 

## 2013-02-14 NOTE — Telephone Encounter (Signed)
Patient requesting hydrocodone refill.  Patient last seen 10/17.  Rx last printed 9.26/14.  Please advise.

## 2013-03-03 ENCOUNTER — Inpatient Hospital Stay (HOSPITAL_COMMUNITY)
Admission: EM | Admit: 2013-03-03 | Discharge: 2013-03-10 | DRG: 194 | Disposition: A | Discharge status: Skilled Nursing Facility | Payer: Medicare PPO | Attending: Internal Medicine | Admitting: Internal Medicine

## 2013-03-03 DIAGNOSIS — K219 Gastro-esophageal reflux disease without esophagitis: Secondary | ICD-10-CM | POA: Diagnosis present

## 2013-03-03 DIAGNOSIS — Z87891 Personal history of nicotine dependence: Secondary | ICD-10-CM

## 2013-03-03 DIAGNOSIS — N401 Enlarged prostate with lower urinary tract symptoms: Secondary | ICD-10-CM | POA: Diagnosis present

## 2013-03-03 DIAGNOSIS — M67919 Unspecified disorder of synovium and tendon, unspecified shoulder: Secondary | ICD-10-CM | POA: Diagnosis present

## 2013-03-03 DIAGNOSIS — J189 Pneumonia, unspecified organism: Principal | ICD-10-CM

## 2013-03-03 DIAGNOSIS — E039 Hypothyroidism, unspecified: Secondary | ICD-10-CM

## 2013-03-03 DIAGNOSIS — E785 Hyperlipidemia, unspecified: Secondary | ICD-10-CM

## 2013-03-03 DIAGNOSIS — K7030 Alcoholic cirrhosis of liver without ascites: Secondary | ICD-10-CM | POA: Diagnosis present

## 2013-03-03 DIAGNOSIS — M719 Bursopathy, unspecified: Secondary | ICD-10-CM | POA: Diagnosis present

## 2013-03-03 DIAGNOSIS — W19XXXA Unspecified fall, initial encounter: Secondary | ICD-10-CM | POA: Diagnosis present

## 2013-03-03 DIAGNOSIS — Z79899 Other long term (current) drug therapy: Secondary | ICD-10-CM

## 2013-03-03 DIAGNOSIS — F1020 Alcohol dependence, uncomplicated: Secondary | ICD-10-CM | POA: Diagnosis present

## 2013-03-03 DIAGNOSIS — N138 Other obstructive and reflux uropathy: Secondary | ICD-10-CM | POA: Diagnosis present

## 2013-03-03 DIAGNOSIS — D72829 Elevated white blood cell count, unspecified: Secondary | ICD-10-CM | POA: Diagnosis present

## 2013-03-03 DIAGNOSIS — Z96619 Presence of unspecified artificial shoulder joint: Secondary | ICD-10-CM

## 2013-03-03 DIAGNOSIS — IMO0002 Reserved for concepts with insufficient information to code with codable children: Secondary | ICD-10-CM

## 2013-03-03 DIAGNOSIS — I10 Essential (primary) hypertension: Secondary | ICD-10-CM

## 2013-03-03 DIAGNOSIS — S2239XA Fracture of one rib, unspecified side, initial encounter for closed fracture: Secondary | ICD-10-CM | POA: Diagnosis present

## 2013-03-03 DIAGNOSIS — K7031 Alcoholic cirrhosis of liver with ascites: Secondary | ICD-10-CM | POA: Diagnosis present

## 2013-03-03 DIAGNOSIS — I4891 Unspecified atrial fibrillation: Secondary | ICD-10-CM

## 2013-03-03 DIAGNOSIS — K5900 Constipation, unspecified: Secondary | ICD-10-CM | POA: Diagnosis present

## 2013-03-03 DIAGNOSIS — Z96659 Presence of unspecified artificial knee joint: Secondary | ICD-10-CM

## 2013-03-03 DIAGNOSIS — F329 Major depressive disorder, single episode, unspecified: Secondary | ICD-10-CM | POA: Diagnosis present

## 2013-03-03 DIAGNOSIS — F3289 Other specified depressive episodes: Secondary | ICD-10-CM | POA: Diagnosis present

## 2013-03-03 DIAGNOSIS — I209 Angina pectoris, unspecified: Secondary | ICD-10-CM | POA: Diagnosis present

## 2013-03-03 NOTE — ED Notes (Signed)
Brought in by EMS from home with c/o fever and lethargy. Per EMS, pt's family reports that he has had a temperature of T101.4, onset yesterday morning; pt's family further reports that pt has been having dysuria with foul-smelly urine; pt is also observed to be more weak than usual.

## 2013-03-03 NOTE — ED Notes (Signed)
Bed: ZO10 Expected date:  Expected time:  Means of arrival:  Comments: EMS/elderly fever

## 2013-03-04 ENCOUNTER — Encounter (HOSPITAL_COMMUNITY): Payer: Self-pay | Admitting: Emergency Medicine

## 2013-03-04 ENCOUNTER — Emergency Department (HOSPITAL_COMMUNITY): Payer: Medicare PPO

## 2013-03-04 DIAGNOSIS — J189 Pneumonia, unspecified organism: Secondary | ICD-10-CM | POA: Diagnosis present

## 2013-03-04 DIAGNOSIS — I4891 Unspecified atrial fibrillation: Secondary | ICD-10-CM

## 2013-03-04 DIAGNOSIS — E039 Hypothyroidism, unspecified: Secondary | ICD-10-CM

## 2013-03-04 DIAGNOSIS — I10 Essential (primary) hypertension: Secondary | ICD-10-CM

## 2013-03-04 DIAGNOSIS — E785 Hyperlipidemia, unspecified: Secondary | ICD-10-CM

## 2013-03-04 LAB — COMPREHENSIVE METABOLIC PANEL WITH GFR
ALT: 15 U/L (ref 0–53)
AST: 41 U/L — ABNORMAL HIGH (ref 0–37)
Albumin: 2.8 g/dL — ABNORMAL LOW (ref 3.5–5.2)
Alkaline Phosphatase: 179 U/L — ABNORMAL HIGH (ref 39–117)
BUN: 19 mg/dL (ref 6–23)
CO2: 21 meq/L (ref 19–32)
Calcium: 9.4 mg/dL (ref 8.4–10.5)
Chloride: 106 meq/L (ref 96–112)
Creatinine, Ser: 0.75 mg/dL (ref 0.50–1.35)
GFR calc Af Amer: >90 mL/min
GFR calc non Af Amer: 80 mL/min — ABNORMAL LOW
Glucose, Bld: 94 mg/dL (ref 70–99)
Potassium: 4 meq/L (ref 3.5–5.1)
Sodium: 135 meq/L (ref 135–145)
Total Bilirubin: 1.8 mg/dL — ABNORMAL HIGH (ref 0.3–1.2)
Total Protein: 5.7 g/dL — ABNORMAL LOW (ref 6.0–8.3)

## 2013-03-04 LAB — CREATININE, SERUM: GFR calc Af Amer: >90 mL/min (ref >90)

## 2013-03-04 LAB — POCT I-STAT, CHEM 8
BUN: 26 mg/dL — ABNORMAL HIGH (ref 6–23)
Calcium, Ion: 1.26 mmol/L (ref 1.13–1.30)
Chloride: 107 meq/L (ref 96–112)
Creatinine, Ser: 1 mg/dL (ref 0.50–1.35)
Glucose, Bld: 85 mg/dL (ref 70–99)
HCT: 36 % — ABNORMAL LOW (ref 39.0–52.0)
Hemoglobin: 12.2 g/dL — ABNORMAL LOW (ref 13.0–17.0)
Potassium: 4.6 meq/L (ref 3.5–5.1)
Sodium: 138 meq/L (ref 135–145)
TCO2: 21 mmol/L (ref 0–100)

## 2013-03-04 LAB — CBC
HCT: 33.7 % — ABNORMAL LOW (ref 39.0–52.0)
HCT: 30.3 % — ABNORMAL LOW (ref 39.0–52.0)
Hemoglobin: 11.8 g/dL — ABNORMAL LOW (ref 13.0–17.0)
MCH: 32.2 pg (ref 26.0–34.0)
MCH: 31.8 pg (ref 26.0–34.0)
MCHC: 35 g/dL (ref 30.0–36.0)
MCHC: 34.7 g/dL (ref 30.0–36.0)
MCV: 91.8 fL (ref 78.0–100.0)
MCV: 91.8 fL (ref 78.0–100.0)
Platelets: 86 K/uL — ABNORMAL LOW (ref 150–400)
Platelets: 75 10*3/uL — ABNORMAL LOW (ref 150–400)
RBC: 3.67 MIL/uL — ABNORMAL LOW (ref 4.22–5.81)
RBC: 3.3 MIL/uL — ABNORMAL LOW (ref 4.22–5.81)
RDW: 15.1 % (ref 11.5–15.5)
RDW: 15.1 % (ref 11.5–15.5)
WBC: 14.4 K/uL — ABNORMAL HIGH (ref 4.0–10.5)
WBC: 11.7 10*3/uL — ABNORMAL HIGH (ref 4.0–10.5)

## 2013-03-04 LAB — URINALYSIS, ROUTINE W REFLEX MICROSCOPIC
Glucose, UA: NEGATIVE mg/dL
Hgb urine dipstick: NEGATIVE
Leukocytes, UA: NEGATIVE
Protein, ur: NEGATIVE mg/dL
Specific Gravity, Urine: 1.026 (ref 1.005–1.030)
pH: 6.5 (ref 5.0–8.0)

## 2013-03-04 LAB — TSH: TSH: 0.124 u[IU]/mL — ABNORMAL LOW (ref 0.350–4.500)

## 2013-03-04 MED ORDER — DOCUSATE SODIUM 100 MG PO CAPS
100.0000 mg | ORAL_CAPSULE | Freq: Two times a day (BID) | ORAL | Status: DC
  Administered 2013-03-04 – 2013-03-10 (×12): 100 mg via ORAL
  Filled 2013-03-04 (×14): qty 1

## 2013-03-04 MED ORDER — HEPARIN SODIUM (PORCINE) 5000 UNIT/ML IJ SOLN
5000.0000 [IU] | Freq: Three times a day (TID) | INTRAMUSCULAR | Status: DC
  Administered 2013-03-04 (×2): 5000 [IU] via SUBCUTANEOUS
  Filled 2013-03-04 (×4): qty 1

## 2013-03-04 MED ORDER — FENOFIBRATE 160 MG PO TABS
160.0000 mg | ORAL_TABLET | Freq: Every day | ORAL | Status: DC
  Administered 2013-03-04 – 2013-03-10 (×7): 160 mg via ORAL
  Filled 2013-03-04 (×7): qty 1

## 2013-03-04 MED ORDER — ACETAMINOPHEN 325 MG PO TABS
650.0000 mg | ORAL_TABLET | Freq: Once | ORAL | Status: AC
  Administered 2013-03-04: 650 mg via ORAL
  Filled 2013-03-04: qty 2

## 2013-03-04 MED ORDER — LEVOTHYROXINE SODIUM 88 MCG PO TABS
88.0000 ug | ORAL_TABLET | Freq: Every day | ORAL | Status: DC
  Administered 2013-03-05 – 2013-03-10 (×6): 88 ug via ORAL
  Filled 2013-03-04 (×7): qty 1

## 2013-03-04 MED ORDER — ONDANSETRON HCL 4 MG/2ML IJ SOLN: 4.0000 mg | Freq: Four times a day (QID) | INTRAMUSCULAR | Status: DC | PRN

## 2013-03-04 MED ORDER — LEVOFLOXACIN IN D5W 750 MG/150ML IV SOLN
750.0000 mg | Freq: Once | INTRAVENOUS | Status: AC
  Administered 2013-03-04: 750 mg via INTRAVENOUS
  Filled 2013-03-04: qty 150

## 2013-03-04 MED ORDER — MIRTAZAPINE 30 MG PO TABS
30.0000 mg | ORAL_TABLET | Freq: Every day | ORAL | Status: DC
  Administered 2013-03-04 – 2013-03-09 (×6): 30 mg via ORAL
  Filled 2013-03-04 (×8): qty 1

## 2013-03-04 MED ORDER — ASPIRIN 81 MG PO CHEW
81.0000 mg | CHEWABLE_TABLET | Freq: Every day | ORAL | Status: DC
  Administered 2013-03-04 – 2013-03-10 (×7): 81 mg via ORAL
  Filled 2013-03-04 (×7): qty 1

## 2013-03-04 MED ORDER — OMEGA-3 FATTY ACIDS 1000 MG PO CAPS: 1.0000 g | ORAL_CAPSULE | Freq: Two times a day (BID) | ORAL | Status: DC

## 2013-03-04 MED ORDER — RESOURCE THICKENUP CLEAR PO POWD
ORAL | Status: DC | PRN
  Filled 2013-03-04: qty 125

## 2013-03-04 MED ORDER — METOPROLOL TARTRATE 25 MG PO TABS
25.0000 mg | ORAL_TABLET | Freq: Two times a day (BID) | ORAL | Status: DC
  Administered 2013-03-04 – 2013-03-10 (×13): 25 mg via ORAL
  Filled 2013-03-04 (×14): qty 1

## 2013-03-04 MED ORDER — ONDANSETRON HCL 4 MG PO TABS: 4.0000 mg | ORAL_TABLET | Freq: Four times a day (QID) | ORAL | Status: DC | PRN

## 2013-03-04 MED ORDER — SIMVASTATIN 20 MG PO TABS
20.0000 mg | ORAL_TABLET | Freq: Every day | ORAL | Status: DC
  Administered 2013-03-04 – 2013-03-09 (×6): 20 mg via ORAL
  Filled 2013-03-04 (×7): qty 1

## 2013-03-04 MED ORDER — LEVOTHYROXINE SODIUM 100 MCG PO TABS
100.0000 ug | ORAL_TABLET | Freq: Every day | ORAL | Status: DC
  Administered 2013-03-04: 07:00:00 100 ug via ORAL
  Filled 2013-03-04 (×2): qty 1

## 2013-03-04 MED ORDER — OMEGA-3-ACID ETHYL ESTERS 1 G PO CAPS
1.0000 g | ORAL_CAPSULE | Freq: Every day | ORAL | Status: DC
  Administered 2013-03-04 – 2013-03-10 (×7): 1 g via ORAL
  Filled 2013-03-04 (×7): qty 1

## 2013-03-04 MED ORDER — PANTOPRAZOLE SODIUM 40 MG PO TBEC
40.0000 mg | DELAYED_RELEASE_TABLET | Freq: Every day | ORAL | Status: DC
  Administered 2013-03-04 – 2013-03-10 (×7): 40 mg via ORAL
  Filled 2013-03-04 (×7): qty 1

## 2013-03-04 MED ORDER — SODIUM CHLORIDE 0.9 % IV SOLN
INTRAVENOUS | Status: DC
  Administered 2013-03-04 – 2013-03-05 (×3): via INTRAVENOUS

## 2013-03-04 MED ORDER — FINASTERIDE 5 MG PO TABS
5.0000 mg | ORAL_TABLET | Freq: Every day | ORAL | Status: DC
  Administered 2013-03-04 – 2013-03-10 (×7): 5 mg via ORAL
  Filled 2013-03-04 (×7): qty 1

## 2013-03-04 MED ORDER — GUAIFENESIN ER 600 MG PO TB12
600.0000 mg | ORAL_TABLET | Freq: Two times a day (BID) | ORAL | Status: DC
  Administered 2013-03-04 – 2013-03-10 (×13): 600 mg via ORAL
  Filled 2013-03-04 (×14): qty 1

## 2013-03-04 MED ORDER — POLYETHYLENE GLYCOL 3350 17 G PO PACK
17.0000 g | PACK | Freq: Two times a day (BID) | ORAL | Status: DC
  Administered 2013-03-04 – 2013-03-10 (×11): 17 g via ORAL
  Filled 2013-03-04 (×14): qty 1

## 2013-03-04 MED ORDER — LEVOFLOXACIN IN D5W 750 MG/150ML IV SOLN
750.0000 mg | INTRAVENOUS | Status: DC
  Administered 2013-03-04 – 2013-03-07 (×3): 750 mg via INTRAVENOUS
  Filled 2013-03-04 (×3): qty 150

## 2013-03-04 MED ORDER — HYDROCODONE-ACETAMINOPHEN 5-325 MG PO TABS
1.0000 | ORAL_TABLET | Freq: Four times a day (QID) | ORAL | Status: DC | PRN
  Administered 2013-03-04: 1 via ORAL
  Filled 2013-03-04: qty 1

## 2013-03-04 MED ORDER — DEXTROSE 5 % IV SOLN
1.0000 g | Freq: Once | INTRAVENOUS | Status: AC
  Administered 2013-03-04: 1 g via INTRAVENOUS
  Filled 2013-03-04: qty 10

## 2013-03-04 MED ORDER — SODIUM CHLORIDE 0.9 % IJ SOLN
3.0000 mL | Freq: Two times a day (BID) | INTRAMUSCULAR | Status: DC
  Administered 2013-03-05 – 2013-03-09 (×7): 3 mL via INTRAVENOUS

## 2013-03-04 NOTE — Evaluation (Signed)
Clinical/Bedside Swallow Evaluation Patient Details  Name: Raymond Paparella Sr. MRN: [id number] Date of Birth: 1923/04/24  Today's Date: 03/04/2013 Time:  -     Past Medical History:  Past Medical History  Diagnosis Date  . Arthritis   . Hyperlipidemia   . Hypertension   . A-fib     Dr Donnie Aho; WAS on Coumadin until 04/2012  . Angina     uses NTG prn  . Pneumonia     12 /2012  . Recurrent upper respiratory infection (URI)     03/2011  . Hypothyroidism     x 10 yrs  . GERD (gastroesophageal reflux disease)   . Constipation, chronic   . Depression   . History of dizziness   . Anxiety   . Urinary frequency   . Hearing loss of both ears     wears hearing aides  . BPH with obstruction/lower urinary tract symptoms     sees Dr Patsi Sears  . Compression fracture of thoracic vertebra, non-traumatic 06/14/12    T6 and T7 (noted on CT angio chest to r/o PE)  . Alcoholic cirrhosis of liver with ascites 06/14/12    Noted on CT angio chest to r/o PE  . Esophageal varices in alcoholic cirrhosis "   "    "   "    "   Past Surgical History:  Past Surgical History  Procedure Laterality Date  . Total knee arthroplasty  1994    right  . Total shoulder replacement  1997    right  . Joint replacement      Rt knee and shoulder  . Hernia repair  1990    LIH repair  . Cholecystectomy  2004  . Inguinal hernia repair  05/09/2011    Procedure: HERNIA REPAIR INGUINAL ADULT;  Surgeon: Adolph Pollack, MD;  Location: [facility] OR;  Service: General;  Laterality: Right;   HPI:  Raymond Murrillo Sr. is an 79 y.o. male lives at home with his elderly wife, having hx of HTN, hyperlipidemia, hx of afib, prior hx of alcoholism with ascites, presents to the ER via EMS with hx of fever of 101, coughs of yellow sputum, and feeling malaise.  He was seen by Dr [name], diagnosed with bronchitis, and was placed on Augmentin with steroids a few weeks ago.  He hadn't gotten better.  He has no chest pain, abdominal pain, nausea  or vomiting.  He reportedly had foul smelling urine at home.  He had hx of prostatitis in the past, but hadn't complaint of any prostatism.  Evaluation in the ER included a CXR which showed interstitial edema, PNA would have similar appearance, leukocytosis with WBC of 14K, and normal renal fx tests. His UA was negative.  Hospitalist was asked to admit him for the above reasons suggestive of pneumonia.   Assessment / Plan / Recommendation Clinical Impression  Pt. found feeding himself in reclined position, spilling soup on himself and his bed.  "I need some help."  Wet vocal quality noted after sips of liquids, frequent belching and delayed cough observed near end of meal.  Objective evalution is indicated to ensure best diet recommendation and reduce aspiration risks.    Aspiration Risk  Moderate    Diet Recommendation Dysphagia 2 (Fine chop);Honey-thick liquid   Liquid Administration via: Cup;No straw Medication Administration: Whole meds with puree Supervision: Patient able to self feed;Staff to assist with self feeding;Full supervision/cueing for compensatory strategies Compensations: Slow rate;Small sips/bites Postural Changes and/or Swallow Maneuvers: Seated  upright 90 degrees;Upright 30-60 min after meal    Other  Recommendations Recommended Consults: MBS Oral Care Recommendations: Oral care BID Other Recommendations: Order thickener from pharmacy;Prohibited food (jello, ice cream, thin soups);Clarify dietary restrictions   Follow Up Recommendations  24 hour supervision/assistance;Home health SLP    Frequency and Duration  (pending MBS)      Pertinent Vitals/Pain CXR:  Vascular congestion, with mildly increased interstitial markings,  possibly reflecting mild interstitial edema. Pneumonia might have a  similar appearance.     SLP Swallow Goals  Defer until MBS   Swallow Study Prior Functional Status       General HPI: Raymond Fjeld. is an 79 y.o. male lives at home with  his elderly wife, having hx of HTN, hyperlipidemia, hx of afib, prior hx of alcoholism with ascites, presents to the ER via EMS with hx of fever of 101, coughs of yellow sputum, and feeling malaise.  He was seen by Dr [name], diagnosed with bronchitis, and was placed on Augmentin with steroids a few weeks ago.  He hadn't gotten better.  He has no chest pain, abdominal pain, nausea or vomiting.  He reportedly had foul smelling urine at home.  He had hx of prostatitis in the past, but hadn't complaint of any prostatism.  Evaluation in the ER included a CXR which showed interstitial edema, PNA would have similar appearance, leukocytosis with WBC of 14K, and normal renal fx tests. His UA was negative.  Hospitalist was asked to admit him for the above reasons suggestive of pneumonia. Type of Study: Bedside swallow evaluation Previous Swallow Assessment: none Diet Prior to this Study: Dysphagia 3 (soft);Thin liquids Temperature Spikes Noted: Yes Respiratory Status: Room air History of Recent Intubation: No Behavior/Cognition: Alert;Cooperative;Pleasant mood;Requires cueing;Decreased sustained attention Oral Cavity - Dentition: Dentures, top;Missing dentition (no lower dentures) Self-Feeding Abilities: Able to feed self;Needs set up Patient Positioning: Partially reclined (Changed to upright bu SLP) Baseline Vocal Quality: Wet Volitional Cough: Weak Volitional Swallow: Able to elicit    Oral/Motor/Sensory Function Overall Oral Motor/Sensory Function: Appears within functional limits for tasks assessed   Ice Chips Ice chips: Not tested   Thin Liquid Thin Liquid: Impaired Presentation: Straw Pharyngeal  Phase Impairments: Wet Vocal Quality    Nectar Thick Nectar Thick Liquid: Impaired Presentation: Cup Pharyngeal Phase Impairments: Wet Vocal Quality;Cough - Delayed   Honey Thick Honey Thick Liquid: Within functional limits Presentation: Cup   Puree Puree: Within functional limits Presentation:  Spoon;Self Fed   Solid   GO    Solid: Within functional limits Presentation: Self Daine Gravel, Brennin Durfee T 03/04/2013,12:42 PM

## 2013-03-04 NOTE — ED Provider Notes (Signed)
CSN: [id number]     Arrival date & time 03/03/13  2358 History   First MD Initiated Contact with Patient 03/04/13 0014     Chief Complaint  Patient presents with  . Fever  . Weakness   (Consider location/radiation/quality/duration/timing/severity/associated sxs/prior Treatment) Patient is a 79 y.o. male presenting with fever and weakness. The history is provided by the patient and the EMS personnel.  Fever Temp source:  Oral Severity:  Moderate Onset quality:  Gradual Duration:  12 hours Timing:  Intermittent Progression:  Waxing and waning Chronicity:  New Relieved by:  Nothing Worsened by:  Nothing tried Ineffective treatments:  None tried Associated symptoms: dysuria   Associated symptoms: no chest pain, no chills, no cough, no diarrhea, no headaches, no nausea, no rash and no vomiting   Risk factors: no recent surgery, no recent travel and no sick contacts   Weakness Pertinent negatives include no chest pain, no abdominal pain, no headaches and no shortness of breath.   Generalized weakness, dysuria and strong smelling urine today with Temp to 101 today at home, PT has not other complaints.    Past Medical History  Diagnosis Date  . Arthritis   . Hyperlipidemia   . Hypertension   . A-fib     Dr Donnie Aho; WAS on Coumadin until 04/2012  . Angina     uses NTG prn  . Pneumonia     12 /2012  . Recurrent upper respiratory infection (URI)     03/2011  . Hypothyroidism     x 10 yrs  . GERD (gastroesophageal reflux disease)   . Constipation, chronic   . Depression   . History of dizziness   . Anxiety   . Urinary frequency   . Hearing loss of both ears     wears hearing aides  . BPH with obstruction/lower urinary tract symptoms     sees Dr Patsi Sears  . Compression fracture of thoracic vertebra, non-traumatic 06/14/12    T6 and T7 (noted on CT angio chest to r/o PE)  . Alcoholic cirrhosis of liver with ascites 06/14/12    Noted on CT angio chest to r/o PE  . Esophageal  varices in alcoholic cirrhosis "   "    "   "    "   Past Surgical History  Procedure Laterality Date  . Total knee arthroplasty  1994    right  . Total shoulder replacement  1997    right  . Joint replacement      Rt knee and shoulder  . Hernia repair  1990    LIH repair  . Cholecystectomy  2004  . Inguinal hernia repair  05/09/2011    Procedure: HERNIA REPAIR INGUINAL ADULT;  Surgeon: Adolph Pollack, MD;  Location: [facility] OR;  Service: General;  Laterality: Right;   History reviewed. No pertinent family history. History  Substance Use Topics  . Smoking status: Former Smoker -- 0.50 packs/day for 35 years    Types: Cigarettes    Quit date: 05/02/1984  . Smokeless tobacco: Current User    Types: Chew  . Alcohol Use: No    Review of Systems  Constitutional: Positive for fever. Negative for chills.  Eyes: Negative for visual disturbance.  Respiratory: Negative for cough and shortness of breath.   Cardiovascular: Negative for chest pain.  Gastrointestinal: Negative for nausea, vomiting, abdominal pain and diarrhea.  Genitourinary: Positive for dysuria.  Musculoskeletal: Negative for back pain, neck pain and neck stiffness.  Skin: Negative  for rash.  Neurological: Positive for weakness. Negative for headaches.  All other systems reviewed and are negative.    Allergies  Amitriptyline; Beta adrenergic blockers; Imdur; Niacin and related; and Sulfa antibiotics  Home Medications   Current Outpatient Rx  Name  Route  Sig  Dispense  Refill  . amoxicillin-clavulanate (AUGMENTIN) 875-125 MG per tablet   Oral   Take 1 tablet by mouth 2 (two) times daily.   20 tablet   0   . aspirin 81 MG tablet   Oral   Take 81 mg by mouth daily.         Marland Kitchen BESIVANCE 0.6 % SUSP               . DUREZOL 0.05 % EMUL               . fenofibrate 160 MG tablet   Oral   Take 160 mg by mouth daily.          . finasteride (PROSCAR) 5 MG tablet               . fish  oil-omega-3 fatty acids 1000 MG capsule   Oral   Take 1 g by mouth 2 (two) times daily.         . furosemide (LASIX) 40 MG tablet      1 tab po qod   60 tablet   1   . HYDROcodone-acetaminophen (NORCO/VICODIN) 5-325 MG per tablet   Oral   Take 1 tablet by mouth every 6 (six) hours as needed.   60 tablet   0   . ILEVRO 0.3 % SUSP               . levothyroxine (SYNTHROID, LEVOTHROID) 100 MCG tablet   Oral   Take 100 mcg by mouth daily.         Marland Kitchen LORazepam (ATIVAN) 0.5 MG tablet      1-2 tabs po qhs prn   60 tablet   1   . meclizine (ANTIVERT) 25 MG tablet   Oral   Take 1 tablet (25 mg total) by mouth 2 (two) times daily as needed. For dizziness   30 tablet   6   . metoprolol tartrate (LOPRESSOR) 25 MG tablet   Oral   Take 1 tablet (25 mg total) by mouth 2 (two) times daily.   60 tablet   5   . mirtazapine (REMERON) 30 MG tablet   Oral   Take 30 mg by mouth At bedtime.          . nitroGLYCERIN (NITROSTAT) 0.4 MG SL tablet   Sublingual   Place 0.4 mg under the tongue every 5 (five) minutes as needed. For chest pain         . pantoprazole (PROTONIX) 40 MG tablet   Oral   Take 40 mg by mouth daily.          . polyethylene glycol (MIRALAX / GLYCOLAX) packet   Oral   Take 17 g by mouth 2 (two) times daily.          . pravastatin (PRAVACHOL) 40 MG tablet   Oral   Take 2 tablets (80 mg total) by mouth daily.   90 tablet   1   . predniSONE (DELTASONE) 20 MG tablet      2 tabs po qd x 5d, then 1 tab po qd x 5d   15 tablet   0   . spironolactone (ALDACTONE) 25  MG tablet      1 tab po qod   60 tablet   3    There were no vitals taken for this visit. Physical Exam  Constitutional: He is oriented to person, place, and time. He appears well-developed and well-nourished.  HENT:  Head: Normocephalic and atraumatic.  Dry mm  Eyes: EOM are normal. Pupils are equal, round, and reactive to light.  Neck: Neck supple.  Cardiovascular: Normal  rate, regular rhythm and intact distal pulses.   Pulmonary/Chest: Effort normal and breath sounds normal. No respiratory distress. He exhibits no tenderness.  Abdominal: Soft. Bowel sounds are normal. He exhibits no distension. There is no tenderness. There is no rebound.  Musculoskeletal: Normal range of motion. He exhibits no edema and no tenderness.  Neurological: He is alert and oriented to person, place, and time. No cranial nerve deficit.  Skin: Skin is warm and dry. No rash noted.    ED Course  Procedures (including critical care time) Labs Review Labs Reviewed  CBC - Abnormal; Notable for the following:    WBC 14.4 (*)    RBC 3.67 (*)    Hemoglobin 11.8 (*)    HCT 33.7 (*)    Platelets 86 (*)    All other components within normal limits  COMPREHENSIVE METABOLIC PANEL - Abnormal; Notable for the following:    Total Protein 5.7 (*)    Albumin 2.8 (*)    AST 41 (*)    Alkaline Phosphatase 179 (*)    Total Bilirubin 1.8 (*)    GFR calc non Af Amer 80 (*)    All other components within normal limits  URINALYSIS, ROUTINE W REFLEX MICROSCOPIC - Abnormal; Notable for the following:    Color, Urine AMBER (*)    Bilirubin Urine SMALL (*)    Urobilinogen, UA 4.0 (*)    All other components within normal limits  CBC - Abnormal; Notable for the following:    WBC 11.7 (*)    RBC 3.30 (*)    Hemoglobin 10.5 (*)    HCT 30.3 (*)    Platelets 75 (*)    All other components within normal limits  CREATININE, SERUM - Abnormal; Notable for the following:    GFR calc non Af Amer 80 (*)    All other components within normal limits  TSH - Abnormal; Notable for the following:    TSH 0.124 (*)    All other components within normal limits  BASIC METABOLIC PANEL - Abnormal; Notable for the following:    Sodium 133 (*)    GFR calc non Af Amer 77 (*)    GFR calc Af Amer 89 (*)    All other components within normal limits  CBC WITH DIFFERENTIAL - Abnormal; Notable for the following:    RBC  3.09 (*)    Hemoglobin 9.9 (*)    HCT 28.3 (*)    Platelets 88 (*)    Monocytes Relative 14 (*)    Monocytes Absolute 1.3 (*)    All other components within normal limits  POCT I-STAT, CHEM 8 - Abnormal; Notable for the following:    BUN 26 (*)    Hemoglobin 12.2 (*)    HCT 36.0 (*)    All other components within normal limits  URINE CULTURE  CULTURE, BLOOD (ROUTINE X 2)  CULTURE, BLOOD (ROUTINE X 2)   Imaging Review Dg Chest Portable 1 View  03/04/2013   CLINICAL DATA:  Fever, cough and weakness.  EXAM: PORTABLE  CHEST - 1 VIEW  COMPARISON:  Chest radiograph performed 06/11/2012, and CTA of the chest performed 06/13/2012  FINDINGS: The lungs are well-aerated. Vascular congestion is noted, with mildly increased interstitial markings, possibly reflecting mild interstitial edema. Pneumonia might have a similar appearance. No pleural effusion or pneumothorax is seen.  The cardiomediastinal silhouette is within normal limits. No acute osseous abnormalities are seen. A right humeral head prosthesis is noted in unchanged position.  IMPRESSION: Vascular congestion, with mildly increased interstitial markings, possibly reflecting mild interstitial edema. Pneumonia might have a similar appearance.   Electronically Signed   By: Roanna Raider M.D.   On: 03/04/2013 01:05   Dg Humerus Right  03/04/2013   CLINICAL DATA:  Right shoulder pain.  EXAM: RIGHT HUMERUS - 2+ VIEW  COMPARISON:  None.  FINDINGS: The patient's right humeral prosthesis is unusually superiorly positioned; however, this appears stable from multiple prior studies, and likely reflects the patient's baseline. There is no evidence of acute fracture. No significant prosthesis loosening is seen. Mild degenerative change is noted at the right acromioclavicular joint, with mild overlying soft tissue swelling.  The visualized portions of the right lung appear clear.  IMPRESSION: No definite evidence of fracture or loosening. Superiorly positioned  appearance of the right humeral prosthesis is stable from multiple prior studies and likely reflects the patient's baseline.   Electronically Signed   By: Roanna Raider M.D.   On: 03/04/2013 02:10    CXR reviewed - ABx provided. D/w R LE, will admit MDM  Dx: fever, CAP  CXR, UA, labs IVfs, IV ABx MED admit   Sunnie Nielsen, MD 03/05/13 [phone]

## 2013-03-04 NOTE — H&P (Signed)
[facility] History and Physical  Ezriel Boffa Sr. ZOX:[id number] DOB: October 07, 1922    PCP:   Jeoffrey Massed, MD   Chief Complaint: coughs, fever, and fatigue.  HPI: Raymond Bamba Sr. is an 79 y.o. male lives at home with his elderly wife, having hx of HTN, hyperlipidemia, hx of afib, prior hx of alcoholism with ascites, presents to the ER via EMS with hx of fever of 101, coughs of yellow sputum, and feeling malaise.  He was seen by Dr [name], diagnosed with bronchitis, and was placed on Augmentin with steroids a few weeks ago.  He hadn't gotten better.  He has no chest pain, abdominal pain, nausea or vomiting.  He reportedly had foul smelling urine at home.  He had hx of prostatitis in the past, but hadn't complaint of any prostatism.  Evaluation in the ER included a CXR which showed interstitial edema, PNA would have similar appearance, leukocytosis with WBC of 14K, and normal renal fx tests. His UA was negative.  Hospitalist was asked to admit him for the above reasons suggestive of pneumonia.  Rewiew of Systems:  Constitutional: Negative for malaise, fever and chills. No significant weight loss or weight gain Eyes: Negative for eye pain, redness and discharge, diplopia, visual changes, or flashes of light. ENMT: Negative for ear pain, hoarseness, nasal congestion, sinus pressure and sore throat. No headaches; tinnitus, drooling, or problem swallowing. Cardiovascular: Negative for chest pain, palpitations, diaphoresis, and peripheral edema. ; No orthopnea, PND Respiratory: Negative for hemoptysis, wheezing and stridor. No pleuritic chestpain. Gastrointestinal: Negative for nausea, vomiting, diarrhea, constipation, abdominal pain, melena, blood in stool, hematemesis, jaundice and rectal bleeding.    Genitourinary: Negative for frequency, dysuria, incontinence,flank pain and hematuria; Musculoskeletal: Negative for back pain and neck pain. Negative for swelling and trauma.;  Skin: . Negative  for pruritus, rash, abrasions, bruising and skin lesion.; ulcerations Neuro: Negative for headache, lightheadedness and neck stiffness. Negative for weakness, altered level of consciousness ,burning feet, involuntary movement, seizure and syncope.  Psych: negative for anxiety, depression, insomnia, tearfulness, panic attacks, hallucinations, paranoia, suicidal or homicidal ideation    Past Medical History  Diagnosis Date  . Arthritis   . Hyperlipidemia   . Hypertension   . A-fib     Dr Donnie Aho; WAS on Coumadin until 04/2012  . Angina     uses NTG prn  . Pneumonia     12 /2012  . Recurrent upper respiratory infection (URI)     03/2011  . Hypothyroidism     x 10 yrs  . GERD (gastroesophageal reflux disease)   . Constipation, chronic   . Depression   . History of dizziness   . Anxiety   . Urinary frequency   . Hearing loss of both ears     wears hearing aides  . BPH with obstruction/lower urinary tract symptoms     sees Dr Patsi Sears  . Compression fracture of thoracic vertebra, non-traumatic 06/14/12    T6 and T7 (noted on CT angio chest to r/o PE)  . Alcoholic cirrhosis of liver with ascites 06/14/12    Noted on CT angio chest to r/o PE  . Esophageal varices in alcoholic cirrhosis "   "    "   "    "    Past Surgical History  Procedure Laterality Date  . Total knee arthroplasty  1994    right  . Total shoulder replacement  1997    right  . Joint replacement  Rt knee and shoulder  . Hernia repair  1990    LIH repair  . Cholecystectomy  2004  . Inguinal hernia repair  05/09/2011    Procedure: HERNIA REPAIR INGUINAL ADULT;  Surgeon: Adolph Pollack, MD;  Location: [facility] OR;  Service: General;  Laterality: Right;    Medications:  HOME MEDS: Prior to Admission medications   Medication Sig Start Date End Date Taking? Authorizing Provider  amoxicillin-clavulanate (AUGMENTIN) 875-125 MG per tablet Take 1 tablet by mouth 2 (two) times daily. 01/22/13   Jeoffrey Massed,  MD  aspirin 81 MG tablet Take 81 mg by mouth daily.    Historical Provider, MD  BESIVANCE 0.6 % SUSP  11/04/12   Historical Provider, MD  DUREZOL 0.05 % EMUL  11/06/12   Historical Provider, MD  fenofibrate 160 MG tablet Take 160 mg by mouth daily.  03/20/11   Historical Provider, MD  finasteride (PROSCAR) 5 MG tablet  05/01/12   Historical Provider, MD  fish oil-omega-3 fatty acids 1000 MG capsule Take 1 g by mouth 2 (two) times daily.    Historical Provider, MD  furosemide (LASIX) 40 MG tablet 1 tab po qod 11/20/12   Jeoffrey Massed, MD  HYDROcodone-acetaminophen (NORCO/VICODIN) 5-325 MG per tablet Take 1 tablet by mouth every 6 (six) hours as needed. 02/14/13   Jeoffrey Massed, MD  ILEVRO 0.3 % SUSP  11/06/12   Historical Provider, MD  levothyroxine (SYNTHROID, LEVOTHROID) 100 MCG tablet Take 100 mcg by mouth daily.    Historical Provider, MD  LORazepam (ATIVAN) 0.5 MG tablet 1-2 tabs po qhs prn 07/18/12   Jeoffrey Massed, MD  meclizine (ANTIVERT) 25 MG tablet Take 1 tablet (25 mg total) by mouth 2 (two) times daily as needed. For dizziness 11/20/12   Jeoffrey Massed, MD  metoprolol tartrate (LOPRESSOR) 25 MG tablet Take 1 tablet (25 mg total) by mouth 2 (two) times daily. 12/04/12   Jeoffrey Massed, MD  mirtazapine (REMERON) 30 MG tablet Take 30 mg by mouth At bedtime.  01/04/11   Historical Provider, MD  nitroGLYCERIN (NITROSTAT) 0.4 MG SL tablet Place 0.4 mg under the tongue every 5 (five) minutes as needed. For chest pain    Historical Provider, MD  pantoprazole (PROTONIX) 40 MG tablet Take 40 mg by mouth daily.  01/24/11   Historical Provider, MD  polyethylene glycol (MIRALAX / GLYCOLAX) packet Take 17 g by mouth 2 (two) times daily.     Historical Provider, MD  pravastatin (PRAVACHOL) 40 MG tablet Take 2 tablets (80 mg total) by mouth daily. 12/25/12   Jeoffrey Massed, MD  predniSONE (DELTASONE) 20 MG tablet 2 tabs po qd x 5d, then 1 tab po qd x 5d 01/22/13   Jeoffrey Massed, MD  spironolactone  (ALDACTONE) 25 MG tablet 1 tab po qod 11/20/12   Jeoffrey Massed, MD     Allergies:  Allergies  Allergen Reactions  . Amitriptyline Other (See Comments)    Dizziness   . Beta Adrenergic Blockers Other (See Comments)    lethargy  . Imdur [Isosorbide]     Headache   . Niacin And Related Rash       . Sulfa Antibiotics Itching, Nausea And Vomiting and Rash    Social History:   reports that he quit smoking about 28 years ago. His smoking use included Cigarettes. He has a 17.5 pack-year smoking history. His smokeless tobacco use includes Chew. He reports that he does not drink alcohol  or use illicit drugs.  Family History: History reviewed. No pertinent family history.   Physical Exam: Filed Vitals:   03/04/13 0018  BP: 164/68  Pulse: 92  Temp: 101.3 F (38.5 C)  TempSrc: Rectal  Height: 5\' 9"  (1.753 m)  SpO2: 96%   Blood pressure 164/68, pulse 92, temperature 101.3 F (38.5 C), temperature source Rectal, height 5\' 9"  (1.753 m), SpO2 96.00%.  GEN:  Pleasant patient lying in the stretcher in no acute distress; cooperative with exam. PSYCH:  alert and oriented x4; does not appear anxious or depressed; affect is appropriate. HEENT: Mucous membranes pink and anicteric; PERRLA; EOM intact; no cervical lymphadenopathy nor thyromegaly or carotid bruit; no JVD; There were no stridor. Neck is very supple. Breasts:: Not examined CHEST WALL: No tenderness CHEST: Normal respiration, diffuse rhonchi, but no wheezing or rales. HEART: Regular rate and rhythm.  There are no murmur, rub, or gallops.   BACK: No kyphosis or scoliosis; no CVA tenderness ABDOMEN: soft and non-tender; no masses, no organomegaly, normal abdominal bowel sounds; no pannus; no intertriginous candida. There is no rebound and no distention. Rectal Exam: No prostate tenderness. EXTREMITIES: No bone or joint deformity; age-appropriate arthropathy of the hands and knees; no edema; no ulcerations.  There is no calf  tenderness. Genitalia: not examined PULSES: 2+ and symmetric SKIN: Normal hydration no rash or ulceration CNS: Cranial nerves 2-12 grossly intact no focal lateralizing neurologic deficit.  Speech is fluent; uvula elevated with phonation, facial symmetry and tongue midline. DTR are normal bilaterally, cerebella exam is intact, barbinski is negative and strengths are equaled bilaterally.  No sensory loss.   Labs on Admission:  Basic Metabolic Panel:  Recent Labs Lab 03/04/13 0040 03/04/13 0059  NA 135 138  K 4.0 4.6  CL 106 107  CO2 21  --   GLUCOSE 94 85  BUN 19 26*  CREATININE 0.75 1.00  CALCIUM 9.4  --    Liver Function Tests:  Recent Labs Lab 03/04/13 0040  AST 41*  ALT 15  ALKPHOS 179*  BILITOT 1.8*  PROT 5.7*  ALBUMIN 2.8*   No results found for this basename: LIPASE, AMYLASE,  in the last 168 hours No results found for this basename: AMMONIA,  in the last 168 hours CBC:  Recent Labs Lab 03/04/13 0040 03/04/13 0059  WBC 14.4*  --   HGB 11.8* 12.2*  HCT 33.7* 36.0*  MCV 91.8  --   PLT 86*  --    Cardiac Enzymes: No results found for this basename: CKTOTAL, CKMB, CKMBINDEX, TROPONINI,  in the last 168 hours  CBG: No results found for this basename: GLUCAP,  in the last 168 hours   Radiological Exams on Admission: Dg Chest Portable 1 View  03/04/2013   CLINICAL DATA:  Fever, cough and weakness.  EXAM: PORTABLE CHEST - 1 VIEW  COMPARISON:  Chest radiograph performed 06/11/2012, and CTA of the chest performed 06/13/2012  FINDINGS: The lungs are well-aerated. Vascular congestion is noted, with mildly increased interstitial markings, possibly reflecting mild interstitial edema. Pneumonia might have a similar appearance. No pleural effusion or pneumothorax is seen.  The cardiomediastinal silhouette is within normal limits. No acute osseous abnormalities are seen. A right humeral head prosthesis is noted in unchanged position.  IMPRESSION: Vascular congestion,  with mildly increased interstitial markings, possibly reflecting mild interstitial edema. Pneumonia might have a similar appearance.   Electronically Signed   By: Roanna Raider M.D.   On: 03/04/2013 01:05   Dg Humerus  Right  03/04/2013   CLINICAL DATA:  Right shoulder pain.  EXAM: RIGHT HUMERUS - 2+ VIEW  COMPARISON:  None.  FINDINGS: The patient's right humeral prosthesis is unusually superiorly positioned; however, this appears stable from multiple prior studies, and likely reflects the patient's baseline. There is no evidence of acute fracture. No significant prosthesis loosening is seen. Mild degenerative change is noted at the right acromioclavicular joint, with mild overlying soft tissue swelling.  The visualized portions of the right lung appear clear.  IMPRESSION: No definite evidence of fracture or loosening. Superiorly positioned appearance of the right humeral prosthesis is stable from multiple prior studies and likely reflects the patient's baseline.   Electronically Signed   By: Roanna Raider M.D.   On: 03/04/2013 02:10   Assessment/Plan Present on Admission:  . CAP (community acquired pneumonia) . Atrial fibrillation . Hypertension . Hypothyroidism . Hyperlipidemia . Alcoholic cirrhosis of liver with ascites  PLAN:  Will admit him for likely community acquired PNA.  I check his prostate, and there is no evidence of prostatitis.  His UA is negative as well.  He does have a productive cough that is persistent, and CXR is suggestive of PNA.  Will continue with his Levaquin.  He was cultured.  For his afib, he is rate controlled, and had been anticogulated until 1/14, when it was discontinued because of hematuria.  For his hypothyroidism, will continue with supplements, and check TSH.  I don't think he has SBP as there is no ascites on exam, and he has no complaints of abdominal symptoms.  He is stable, full code, and will be admitted to [facility] service.   Thank you for allowing me to  participate in his care.  Other plans as per orders.  Code Status: FULL Unk Lightning, MD. [facility] Pager [phone] 7pm to 7am.  03/04/2013, 3:18 AM

## 2013-03-04 NOTE — Progress Notes (Signed)
I have seen and assessed patient and I agree with Dr. Irwin Brakeman assessment and plan. Patient's TSH is decreased at 0.124. Will decrease patient's Synthroid to 88 mcg daily. Will place on Mucinex. Continue empiric IV antibiotics. Supportive care.

## 2013-03-05 ENCOUNTER — Ambulatory Visit: Payer: Medicare PPO | Admitting: Family Medicine

## 2013-03-05 ENCOUNTER — Inpatient Hospital Stay (HOSPITAL_COMMUNITY): Payer: Medicare PPO

## 2013-03-05 LAB — CBC WITH DIFFERENTIAL/PLATELET
Basophils Absolute: 0 10*3/uL (ref 0.0–0.1)
Eosinophils Absolute: 0.3 10*3/uL (ref 0.0–0.7)
Eosinophils Relative: 3 % (ref 0–5)
HCT: 28.3 % — ABNORMAL LOW (ref 39.0–52.0)
Lymphocytes Relative: 21 % (ref 12–46)
MCH: 32 pg (ref 26.0–34.0)
MCHC: 35 g/dL (ref 30.0–36.0)
MCV: 91.6 fL (ref 78.0–100.0)
Monocytes Absolute: 1.3 10*3/uL — ABNORMAL HIGH (ref 0.1–1.0)
Neutro Abs: 6 10*3/uL (ref 1.7–7.7)
Platelets: 88 10*3/uL — ABNORMAL LOW (ref 150–400)
RDW: 15.2 % (ref 11.5–15.5)
WBC: 9.7 10*3/uL (ref 4.0–10.5)

## 2013-03-05 LAB — BASIC METABOLIC PANEL
BUN: 17 mg/dL (ref 6–23)
CO2: 20 mEq/L (ref 19–32)
Calcium: 9 mg/dL (ref 8.4–10.5)
Chloride: 107 mEq/L (ref 96–112)
Creatinine, Ser: 0.81 mg/dL (ref 0.50–1.35)
Glucose, Bld: 80 mg/dL (ref 70–99)

## 2013-03-05 LAB — URINE CULTURE

## 2013-03-05 NOTE — Progress Notes (Signed)
Speech Language Pathology Treatment: Dysphagia  Patient Details Name: Seth Higginbotham Sr. MRN: [id number] DOB: 21-Aug-1922 Today's Date: 03/05/2013 Time: 1420-1450 SLP Time Calculation (min): 30 min  Assessment / Plan / Recommendation Clinical Impression  Pt seen for dysphagia treatment to educate pt to findings of MBS reinforce effective compensation strategies to mitigate aspiration risk.  Using teach back, visual and verbal aids (SLP posted signs in room for pt), pt able to verbalize compensation strategies and sensory deficits observed during testing.  He will need supervision for generalization of strategies, especially due to sensory deficits.   SLP to follow up for pt/family education and to provide oropharyngeal exercises for pt to initiate prior to dc with home health SLP.  Pt is agreeable to plan.    MD please consider PT/OT evaluation as pt expressed desire to ambulate and may be weak from current medical illness.  Pt resides at home with caregivers per chart review.      HPI HPI: see previous note   Pertinent Vitals Afebrile, decreased  SLP Plan  Continue with current plan of care    Recommendations Diet recommendations: Dysphagia 2 (fine chop);Thin liquid Medication Administration: Crushed with puree Supervision: Patient able to self feed;Full supervision/cueing for compensatory strategies Compensations: Slow rate;Small sips/bites;Multiple dry swallows after each bite/sip;Clear throat after each swallow Postural Changes and/or Swallow Maneuvers: Seated upright 90 degrees;Upright 30-60 min after meal              Oral Care Recommendations: Oral care BID Follow up Recommendations: 24 hour supervision/assistance;Home health SLP Plan: Continue with current plan of care    GO     Mills Koller, MS [employer] SLP [phone]

## 2013-03-05 NOTE — Progress Notes (Signed)
While receiving report last night at shift change, patient's daughters requested for an Xray on his right arm/shoulder area during his hospital stay.  Patient fell at home about a month ago and has very limited movement in his right arm.  He complains of severe pain and is very guarding over that right arm.  I will pass this information to the day shift nurse to follow up on.   Ernesta Amble, RN

## 2013-03-05 NOTE — Progress Notes (Signed)
Please disregard previous note, patient's right arm pain has already be discussed and tests have been performed.  Ernesta Amble, RN

## 2013-03-05 NOTE — Procedures (Signed)
Objective Swallowing Evaluation: Modified Barium Swallowing Study  Patient Details  Name: Raymond Hurwitz Sr. MRN: [id number] Date of Birth: 1923/03/23  Today's Date: 03/05/2013 Time: 1135-1203 SLP Time Calculation (min): 28 min  Past Medical History:  Past Medical History  Diagnosis Date  . Arthritis   . Hyperlipidemia   . Hypertension   . A-fib     Dr Donnie Aho; WAS on Coumadin until 04/2012  . Angina     uses NTG prn  . Pneumonia     12 /2012  . Recurrent upper respiratory infection (URI)     03/2011  . Hypothyroidism     x 10 yrs  . GERD (gastroesophageal reflux disease)   . Constipation, chronic   . Depression   . History of dizziness   . Anxiety   . Urinary frequency   . Hearing loss of both ears     wears hearing aides  . BPH with obstruction/lower urinary tract symptoms     sees Dr Patsi Sears  . Compression fracture of thoracic vertebra, non-traumatic 06/14/12    T6 and T7 (noted on CT angio chest to r/o PE)  . Alcoholic cirrhosis of liver with ascites 06/14/12    Noted on CT angio chest to r/o PE  . Esophageal varices in alcoholic cirrhosis "   "    "   "    "   Past Surgical History:  Past Surgical History  Procedure Laterality Date  . Total knee arthroplasty  1994    right  . Total shoulder replacement  1997    right  . Joint replacement      Rt knee and shoulder  . Hernia repair  1990    LIH repair  . Cholecystectomy  2004  . Inguinal hernia repair  05/09/2011    Procedure: HERNIA REPAIR INGUINAL ADULT;  Surgeon: Adolph Pollack, MD;  Location: [facility] OR;  Service: General;  Laterality: Right;   HPI:  Raymond Akram Sr. is an 79 y.o. male lives at home with his elderly wife, having hx of HTN, hyperlipidemia, hx of afib, prior hx of alcoholism with ascites, presents to the ER via EMS with hx of fever of 101, coughs of yellow sputum, and feeling malaise.  He was seen by Dr [name], diagnosed with bronchitis, and was placed on Augmentin with steroids a few weeks ago.   He hadn't gotten better.  He has no chest pain, abdominal pain, nausea or vomiting.  He reportedly had foul smelling urine at home.  He had hx of prostatitis in the past, but hadn't complaint of any prostatism.  Evaluation in the ER included a CXR which showed interstitial edema, PNA would have similar appearance, leukocytosis with WBC of 14K, and normal renal fx tests. His UA was negative.  BSE completed revealing concern for multifactorial dysphagia and aspiraiton.  MBS recommended to allow determination for best diet and strategies to mitigate aspiration.       Assessment / Plan / Recommendation Clinical Impression  Dysphagia Diagnosis: Moderate oral phase dysphagia;Mild pharyngeal phase dysphagia;Suspected primary esophageal dysphagia;Moderate cervical esophageal phase dysphagia  Clinical impression: Pt presents with moderate oral, mild pharyngeal and moderate cervical esophageal phase dysphagia.  Pt's dysphagia characterized by oral weakness resulting in delayed oral transiting and lingual pumping.    Pharyngeal stasis present primarily due to decreased UES opening and known Zenker's diverticulum  (seen on barium swallow in 2011).  Laryngeal penetration of thin liquid noted x3- but adequately cleared with CUED throat clear and  dry swallow.    Please note pt DID NOT sense pharyngeal or esophageal stasis during testing, which will increase his risk.  Pt was able to conduct dry swallows and propel barium stasis from pharynx into oral cavity and expectorate to maximize airway protection.   Pt produces independent breathhold with double swallow while consuming liquids, SLP suspects this is a compensation strategy created by pt due to chronicity of dysphagia.  Suspect dysphagia may be worsened now due to current medical issue/weakness and follow up SLP, ? HH may be helpful to strengthen pt's oral swallow and aid generalization of compensation strategies.      Rec to return to Dys2 diet due to weakness  and advance liquids to thin with strict precautions to mitigate aspiration.   Increased viscocity of liquids may further impair esophageal clearance and aspiration was prevented with strict compensation strategies.       Note, pt did cough during MBS but no barium was visualized in larynx/trachea at that time.      Treatment Recommendation  Therapy as outlined in treatment plan below    Diet Recommendation Dysphagia 2 (Fine chop);Thin liquid   Liquid Administration via: Cup Medication Administration: Crushed with puree Supervision: Patient able to self feed;Full supervision/cueing for compensatory strategies Compensations: Slow rate;Small sips/bites;Multiple dry swallows after each bite/sip;Clear throat after each swallow (hock to clear pharyngeal stasis at end of meal) Postural Changes and/or Swallow Maneuvers: Seated upright 90 degrees;Upright 30-60 min after meal    Other  Recommendations Oral Care Recommendations: Oral care BID   Follow Up Recommendations  24 hour supervision/assistance;Home health SLP    Frequency and Duration min 2x/week  2 weeks   Pertinent Vitals/Pain Afebrile, decreased    SLP Swallow Goals  see care plan   General Date of Onset: 03/05/13 HPI: Raymond Radoncic Sr. is an 79 y.o. male lives at home with his elderly wife, having hx of HTN, hyperlipidemia, hx of afib, prior hx of alcoholism with ascites, presents to the ER via EMS with hx of fever of 101, coughs of yellow sputum, and feeling malaise.  He was seen by Dr [name], diagnosed with bronchitis, and was placed on Augmentin with steroids a few weeks ago.  He hadn't gotten better.  He has no chest pain, abdominal pain, nausea or vomiting.  He reportedly had foul smelling urine at home.  He had hx of prostatitis in the past, but hadn't complaint of any prostatism.  Evaluation in the ER included a CXR which showed interstitial edema, PNA would have similar appearance, leukocytosis with WBC of 14K, and normal renal  fx tests. His UA was negative.  BSE completed revealing concern for multifactorial dysphagia and aspiraiton.  MBS recommended to allow determination for best diet and strategies to mitigate aspiration.   Type of Study: Modified Barium Swallowing Study Reason for Referral: Objectively evaluate swallowing function Previous Swallow Assessment: none Diet Prior to this Study: Dysphagia 2 (chopped);Honey-thick liquids Respiratory Status: Room air History of Recent Intubation: No Behavior/Cognition: Alert;Cooperative;Pleasant mood;Requires cueing;Decreased sustained attention;Hard of hearing Oral Cavity - Dentition: Dentures, top;Missing dentition (no lower dentures) Oral Motor / Sensory Function: Impaired - see Bedside swallow eval Self-Feeding Abilities: Able to feed self;Needs set up (right shoulder issues) Patient Positioning: Upright in bed Baseline Vocal Quality: Wet;Low vocal intensity Volitional Cough: Weak Volitional Swallow: Able to elicit Anatomy: Within functional limits Pharyngeal Secretions: Not observed secondary MBS    Reason for Referral Objectively evaluate swallowing function   Oral Phase Oral Preparation/Oral Phase Oral  Phase: Impaired Oral - Nectar Oral - Nectar Teaspoon: Lingual pumping;Delayed oral transit;Reduced posterior propulsion;Weak lingual manipulation Oral - Nectar Cup: Weak lingual manipulation;Delayed oral transit;Lingual pumping;Reduced posterior propulsion Oral - Thin Oral - Thin Teaspoon: Reduced posterior propulsion;Lingual pumping;Weak lingual manipulation;Delayed oral transit Oral - Thin Cup: Lingual/palatal residue;Reduced posterior propulsion;Lingual pumping;Weak lingual manipulation Oral - Thin Straw: Reduced posterior propulsion;Weak lingual manipulation;Delayed oral transit Oral - Solids Oral - Puree: Reduced posterior propulsion;Delayed oral transit;Lingual pumping;Weak lingual manipulation Oral - Regular: Impaired mastication;Reduced posterior  propulsion;Weak lingual manipulation;Lingual pumping;Delayed oral transit   Pharyngeal Phase Pharyngeal Phase Pharyngeal Phase: Impaired Pharyngeal - Thin Pharyngeal - Thin Teaspoon: Pharyngeal residue - pyriform sinuses;Pharyngeal residue - valleculae Pharyngeal - Thin Cup: Delayed swallow initiation;Pharyngeal residue - valleculae;Pharyngeal residue - pyriform sinuses Pharyngeal - Thin Straw: Delayed swallow initiation;Premature spillage to pyriform sinuses;Premature spillage to valleculae;Pharyngeal residue - pyriform sinuses;Pharyngeal residue - valleculae Pharyngeal - Solids Pharyngeal - Puree: Pharyngeal residue - valleculae;Pharyngeal residue - pyriform sinuses Pharyngeal - Regular: Pharyngeal residue - valleculae;Pharyngeal residue - pyriform sinuses Pharyngeal Phase - Comment Pharyngeal Comment: head turn right (to weak side) nor chin tuck helpful to prevent stasis, CUED dry swallows effective to decrease stasis, pt able to "hock" to aid pharyngeal clearance that pt did not sense  Cervical Esophageal Phase    GO    Cervical Esophageal Phase Cervical Esophageal Phase: Impaired Cervical Esophageal Phase - Nectar Nectar Teaspoon: Reduced cricopharyngeal relaxation Nectar Cup: Reduced cricopharyngeal relaxation;Esophageal backflow into the pharynx Cervical Esophageal Phase - Thin Thin Teaspoon: Reduced cricopharyngeal relaxation Thin Cup: Reduced cricopharyngeal relaxation;Esophageal backflow into the pharynx Thin Straw: Reduced cricopharyngeal relaxation Cervical Esophageal Phase - Solids Puree: Reduced cricopharyngeal relaxation Regular: Reduced cricopharyngeal relaxation Cervical Esophageal Phase - Comment Cervical Esophageal Comment: pt belching during MBS with backflow x2 observed, appearance of Zenker's diverticulum noted with delayed clearance distally with ? findings consistent with spasm WITHOUT pt sensation- CUED dry swallow aided esophageal clearance- radiologist not  present to confirm findings,  esophagram completed in 2011 in epic for MD to review if desire         Donavan Burnet, MS [facility] SLP [phone]

## 2013-03-05 NOTE — Progress Notes (Signed)
[facility] PROGRESS NOTE  Yuvin Bussiere Sr. ZOX:[id number] DOB: 22-Jul-1922 DOA: 03/03/2013 PCP: Jeoffrey Massed, MD  Assessment/Plan: 1. CAP: - admitted to telemetry and started him on IV levaquin.  - repeat CXR in am.  - blood cultures and sputum cultures ordered.   2. ATrial fibrillation: - rate controlled  3. Hypertension:  - controlled.  DVT prophylaxis.   Code Status: full code Family Communication: none at bedside Disposition Plan: pending PT eval.   Consultants: none Procedures:  none  Antibiotics:  LEVAQUIN 11/11  HPI/Subjective: Comfortable. Reports right shoulder pain.   Objective: Filed Vitals:   03/05/13 1049  BP: 155/69  Pulse: 87  Temp: 98.7 F (37.1 C)  Resp: 19    Intake/Output Summary (Last 24 hours) at 03/05/13 1640 Last data filed at 03/05/13 1403  Gross per 24 hour  Intake 2090.83 ml  Output    402 ml  Net 1688.83 ml   Filed Weights   03/04/13 0450  Weight: 61.8 kg (136 lb 3.9 oz)    Exam:   General:  Alert afebrile comfortable  Cardiovascular: s1s2  Respiratory: ctab  Abdomen: soft NT ND BS+  Musculoskeletal: no pedal edema.   Data Reviewed: Basic Metabolic Panel:  Recent Labs Lab 03/04/13 0040 03/04/13 0059 03/04/13 0645 03/05/13 0457  NA 135 138  --  133*  K 4.0 4.6  --  4.0  CL 106 107  --  107  CO2 21  --   --  20  GLUCOSE 94 85  --  80  BUN 19 26*  --  17  CREATININE 0.75 1.00 0.75 0.81  CALCIUM 9.4  --   --  9.0   Liver Function Tests:  Recent Labs Lab 03/04/13 0040  AST 41*  ALT 15  ALKPHOS 179*  BILITOT 1.8*  PROT 5.7*  ALBUMIN 2.8*   No results found for this basename: LIPASE, AMYLASE,  in the last 168 hours No results found for this basename: AMMONIA,  in the last 168 hours CBC:  Recent Labs Lab 03/04/13 0040 03/04/13 0059 03/04/13 0645 03/05/13 0457  WBC 14.4*  --  11.7* 9.7  NEUTROABS  --   --   --  6.0  HGB 11.8* 12.2* 10.5* 9.9*  HCT 33.7* 36.0* 30.3* 28.3*  MCV  91.8  --  91.8 91.6  PLT 86*  --  75* 88*   Cardiac Enzymes: No results found for this basename: CKTOTAL, CKMB, CKMBINDEX, TROPONINI,  in the last 168 hours BNP (last 3 results) No results found for this basename: PROBNP,  in the last 8760 hours CBG: No results found for this basename: GLUCAP,  in the last 168 hours  Recent Results (from the past 240 hour(s))  URINE CULTURE     Status: None   Collection Time    03/04/13  1:00 AM      Result Value Range Status   Specimen Description URINE, CATHETERIZED   Final   Special Requests NONE   Final   Culture  Setup Time     Final   Value: 03/04/2013 12:25     Performed at [facility]   Culture     Final   Value: NO GROWTH     Performed at [facility]   Report Status 03/05/2013 FINAL   Final  CULTURE, BLOOD (ROUTINE X 2)     Status: None   Collection Time    03/04/13  6:45 AM      Result Value Range Status  Specimen Description BLOOD LEFT ARM   Final   Special Requests BOTTLES DRAWN AEROBIC AND ANAEROBIC 5CC   Final   Culture  Setup Time     Final   Value: 03/04/2013 08:38     Performed at [facility]   Culture     Final   Value:        BLOOD CULTURE RECEIVED NO GROWTH TO DATE CULTURE WILL BE HELD FOR 5 DAYS BEFORE ISSUING A FINAL NEGATIVE REPORT     Performed at [facility]   Report Status PENDING   Incomplete  CULTURE, BLOOD (ROUTINE X 2)     Status: None   Collection Time    03/04/13  6:50 AM      Result Value Range Status   Specimen Description BLOOD LEFT HAND   Final   Special Requests BOTTLES DRAWN AEROBIC AND ANAEROBIC 5CC   Final   Culture  Setup Time     Final   Value: 03/04/2013 08:38     Performed at [facility]   Culture     Final   Value:        BLOOD CULTURE RECEIVED NO GROWTH TO DATE CULTURE WILL BE HELD FOR 5 DAYS BEFORE ISSUING A FINAL NEGATIVE REPORT     Performed at [facility]   Report Status PENDING   Incomplete     Studies: Dg Chest Portable 1  View  03/04/2013   CLINICAL DATA:  Fever, cough and weakness.  EXAM: PORTABLE CHEST - 1 VIEW  COMPARISON:  Chest radiograph performed 06/11/2012, and CTA of the chest performed 06/13/2012  FINDINGS: The lungs are well-aerated. Vascular congestion is noted, with mildly increased interstitial markings, possibly reflecting mild interstitial edema. Pneumonia might have a similar appearance. No pleural effusion or pneumothorax is seen.  The cardiomediastinal silhouette is within normal limits. No acute osseous abnormalities are seen. A right humeral head prosthesis is noted in unchanged position.  IMPRESSION: Vascular congestion, with mildly increased interstitial markings, possibly reflecting mild interstitial edema. Pneumonia might have a similar appearance.   Electronically Signed   By: Roanna Raider M.D.   On: 03/04/2013 01:05   Dg Humerus Right  03/04/2013   CLINICAL DATA:  Right shoulder pain.  EXAM: RIGHT HUMERUS - 2+ VIEW  COMPARISON:  None.  FINDINGS: The patient's right humeral prosthesis is unusually superiorly positioned; however, this appears stable from multiple prior studies, and likely reflects the patient's baseline. There is no evidence of acute fracture. No significant prosthesis loosening is seen. Mild degenerative change is noted at the right acromioclavicular joint, with mild overlying soft tissue swelling.  The visualized portions of the right lung appear clear.  IMPRESSION: No definite evidence of fracture or loosening. Superiorly positioned appearance of the right humeral prosthesis is stable from multiple prior studies and likely reflects the patient's baseline.   Electronically Signed   By: Roanna Raider M.D.   On: 03/04/2013 02:10   Dg Swallowing Func-speech Pathology  03/05/2013   Chales Abrahams, CCC-SLP     03/05/2013  2:08 PM Objective Swallowing Evaluation: Modified Barium Swallowing Study   Patient Details  Name: Kavan Devan Sr. MRN: [id number] Date of Birth: 1922/08/10   Today's Date: 03/05/2013 Time: 1135-1203 SLP Time Calculation (min): 28 min  Past Medical History:  Past Medical History  Diagnosis Date  . Arthritis   . Hyperlipidemia   . Hypertension   . A-fib     Dr Donnie Aho; WAS on Coumadin until  04/2012  . Angina     uses NTG prn  . Pneumonia     12 /2012  . Recurrent upper respiratory infection (URI)     03/2011  . Hypothyroidism     x 10 yrs  . GERD (gastroesophageal reflux disease)   . Constipation, chronic   . Depression   . History of dizziness   . Anxiety   . Urinary frequency   . Hearing loss of both ears     wears hearing aides  . BPH with obstruction/lower urinary tract symptoms     sees Dr Patsi Sears  . Compression fracture of thoracic vertebra, non-traumatic  06/14/12    T6 and T7 (noted on CT angio chest to r/o PE)  . Alcoholic cirrhosis of liver with ascites 06/14/12    Noted on CT angio chest to r/o PE  . Esophageal varices in alcoholic cirrhosis "   "    "   "    "   Past Surgical History:  Past Surgical History  Procedure Laterality Date  . Total knee arthroplasty  1994    right  . Total shoulder replacement  1997    right  . Joint replacement      Rt knee and shoulder  . Hernia repair  1990    LIH repair  . Cholecystectomy  2004  . Inguinal hernia repair  05/09/2011    Procedure: HERNIA REPAIR INGUINAL ADULT;  Surgeon: Adolph Pollack, MD;  Location: [facility] OR;  Service: General;  Laterality:  Right;   HPI:  Noelle Hoogland Sr. is an 79 y.o. male lives at home with his elderly  wife, having hx of HTN, hyperlipidemia, hx of afib, prior hx of  alcoholism with ascites, presents to the ER via EMS with hx of  fever of 101, coughs of yellow sputum, and feeling malaise.  He  was seen by Dr [name], diagnosed with bronchitis, and was placed  on Augmentin with steroids a few weeks ago.  He hadn't gotten  better.  He has no chest pain, abdominal pain, nausea or  vomiting.  He reportedly had foul smelling urine at home.  He had  hx of prostatitis in the past, but hadn't complaint of  any  prostatism.  Evaluation in the ER included a CXR which showed  interstitial edema, PNA would have similar appearance,  leukocytosis with WBC of 14K, and normal renal fx tests. His UA  was negative.  BSE completed revealing concern for multifactorial  dysphagia and aspiraiton.  MBS recommended to allow determination  for best diet and strategies to mitigate aspiration.       Assessment / Plan / Recommendation Clinical Impression  Dysphagia Diagnosis: Moderate oral phase dysphagia;Mild  pharyngeal phase dysphagia;Suspected primary esophageal  dysphagia;Moderate cervical esophageal phase dysphagia  Clinical impression: Pt presents with moderate oral, mild  pharyngeal and moderate cervical esophageal phase dysphagia.   Pt's dysphagia characterized by oral weakness resulting in  delayed oral transiting and lingual pumping.    Pharyngeal stasis  present primarily due to decreased UES opening and known Zenker's  diverticulum  (seen on barium swallow in 2011).  Laryngeal  penetration of thin liquid noted x3- but adequately cleared with  CUED throat clear and dry swallow.    Please note pt DID NOT sense pharyngeal or esophageal stasis  during testing, which will increase his risk.  Pt was able to  conduct dry swallows and propel barium stasis from pharynx into  oral cavity and  expectorate to maximize airway protection.   Pt produces independent breathhold with double swallow while  consuming liquids, SLP suspects this is a compensation strategy  created by pt due to chronicity of dysphagia.  Suspect dysphagia  may be worsened now due to current medical issue/weakness and  follow up SLP, ? HH may be helpful to strengthen pt's oral  swallow and aid generalization of compensation strategies.      Rec to return to Dys2 diet due to weakness and advance liquids to  thin with strict precautions to mitigate aspiration.   Increased  viscocity of liquids may further impair esophageal clearance and  aspiration was prevented with  strict compensation strategies.       Note, pt did cough during MBS but no barium was visualized in  larynx/trachea at that time.      Treatment Recommendation  Therapy as outlined in treatment plan below    Diet Recommendation Dysphagia 2 (Fine chop);Thin liquid   Liquid Administration via: Cup Medication Administration: Crushed with puree Supervision: Patient able to self feed;Full supervision/cueing  for compensatory strategies Compensations: Slow rate;Small sips/bites;Multiple dry swallows  after each bite/sip;Clear throat after each swallow (hock to  clear pharyngeal stasis at end of meal) Postural Changes and/or Swallow Maneuvers: Seated upright 90  degrees;Upright 30-60 min after meal    Other  Recommendations Oral Care Recommendations: Oral care BID   Follow Up Recommendations  24 hour supervision/assistance;Home health SLP    Frequency and Duration min 2x/week  2 weeks   Pertinent Vitals/Pain Afebrile, decreased    SLP Swallow Goals  see care plan   General Date of Onset: 03/05/13 HPI: Jovonte Commins Sr. is an 79 y.o. male lives at home with his  elderly wife, having hx of HTN, hyperlipidemia, hx of afib, prior  hx of alcoholism with ascites, presents to the ER via EMS with hx  of fever of 101, coughs of yellow sputum, and feeling malaise.   He was seen by Dr [name], diagnosed with bronchitis, and was  placed on Augmentin with steroids a few weeks ago.  He hadn't  gotten better.  He has no chest pain, abdominal pain, nausea or  vomiting.  He reportedly had foul smelling urine at home.  He had  hx of prostatitis in the past, but hadn't complaint of any  prostatism.  Evaluation in the ER included a CXR which showed  interstitial edema, PNA would have similar appearance,  leukocytosis with WBC of 14K, and normal renal fx tests. His UA  was negative.  BSE completed revealing concern for multifactorial  dysphagia and aspiraiton.  MBS recommended to allow determination  for best diet and strategies to mitigate  aspiration.   Type of Study: Modified Barium Swallowing Study Reason for Referral: Objectively evaluate swallowing function Previous Swallow Assessment: none Diet Prior to this Study: Dysphagia 2 (chopped);Honey-thick  liquids Respiratory Status: Room air History of Recent Intubation: No Behavior/Cognition: Alert;Cooperative;Pleasant mood;Requires  cueing;Decreased sustained attention;Hard of hearing Oral Cavity - Dentition: Dentures, top;Missing dentition (no  lower dentures) Oral Motor / Sensory Function: Impaired - see Bedside swallow  eval Self-Feeding Abilities: Able to feed self;Needs set up (right  shoulder issues) Patient Positioning: Upright in bed Baseline Vocal Quality: Wet;Low vocal intensity Volitional Cough: Weak Volitional Swallow: Able to elicit Anatomy: Within functional limits Pharyngeal Secretions: Not observed secondary MBS    Reason for Referral Objectively evaluate swallowing function   Oral Phase Oral Preparation/Oral Phase Oral Phase: Impaired Oral - Nectar Oral - Nectar  Teaspoon: Lingual pumping;Delayed oral  transit;Reduced posterior propulsion;Weak lingual manipulation Oral - Nectar Cup: Weak lingual manipulation;Delayed oral  transit;Lingual pumping;Reduced posterior propulsion Oral - Thin Oral - Thin Teaspoon: Reduced posterior propulsion;Lingual  pumping;Weak lingual manipulation;Delayed oral transit Oral - Thin Cup: Lingual/palatal residue;Reduced posterior  propulsion;Lingual pumping;Weak lingual manipulation Oral - Thin Straw: Reduced posterior propulsion;Weak lingual  manipulation;Delayed oral transit Oral - Solids Oral - Puree: Reduced posterior propulsion;Delayed oral  transit;Lingual pumping;Weak lingual manipulation Oral - Regular: Impaired mastication;Reduced posterior  propulsion;Weak lingual manipulation;Lingual pumping;Delayed oral  transit   Pharyngeal Phase Pharyngeal Phase Pharyngeal Phase: Impaired Pharyngeal - Thin Pharyngeal - Thin Teaspoon: Pharyngeal residue -  pyriform  sinuses;Pharyngeal residue - valleculae Pharyngeal - Thin Cup: Delayed swallow initiation;Pharyngeal  residue - valleculae;Pharyngeal residue - pyriform sinuses Pharyngeal - Thin Straw: Delayed swallow initiation;Premature  spillage to pyriform sinuses;Premature spillage to  valleculae;Pharyngeal residue - pyriform sinuses;Pharyngeal  residue - valleculae Pharyngeal - Solids Pharyngeal - Puree: Pharyngeal residue - valleculae;Pharyngeal  residue - pyriform sinuses Pharyngeal - Regular: Pharyngeal residue - valleculae;Pharyngeal  residue - pyriform sinuses Pharyngeal Phase - Comment Pharyngeal Comment: head turn right (to weak side) nor chin tuck  helpful to prevent stasis, CUED dry swallows effective to  decrease stasis, pt able to "hock" to aid pharyngeal clearance  that pt did not sense  Cervical Esophageal Phase    GO    Cervical Esophageal Phase Cervical Esophageal Phase: Impaired Cervical Esophageal Phase - Nectar Nectar Teaspoon: Reduced cricopharyngeal relaxation Nectar Cup: Reduced cricopharyngeal relaxation;Esophageal  backflow into the pharynx Cervical Esophageal Phase - Thin Thin Teaspoon: Reduced cricopharyngeal relaxation Thin Cup: Reduced cricopharyngeal relaxation;Esophageal backflow  into the pharynx Thin Straw: Reduced cricopharyngeal relaxation Cervical Esophageal Phase - Solids Puree: Reduced cricopharyngeal relaxation Regular: Reduced cricopharyngeal relaxation Cervical Esophageal Phase - Comment Cervical Esophageal Comment: pt belching during MBS with backflow  x2 observed, appearance of Zenker's diverticulum noted with  delayed clearance distally with ? findings consistent with spasm  WITHOUT pt sensation- CUED dry swallow aided esophageal  clearance- radiologist not present to confirm findings,   esophagram completed in 2011 in epic for MD to review if desire         Donavan Burnet, MS [facility] SLP [phone]     Scheduled Meds: . aspirin  81 mg Oral Daily  . docusate sodium  100 mg  Oral BID  . fenofibrate  160 mg Oral Daily  . finasteride  5 mg Oral Daily  . guaiFENesin  600 mg Oral BID  . levofloxacin (LEVAQUIN) IV  750 mg Intravenous Q24H  . levothyroxine  88 mcg Oral QAC breakfast  . metoprolol tartrate  25 mg Oral BID  . mirtazapine  30 mg Oral QHS  . omega-3 acid ethyl esters  1 g Oral Daily  . pantoprazole  40 mg Oral Daily  . polyethylene glycol  17 g Oral BID  . simvastatin  20 mg Oral q1800  . sodium chloride  3 mL Intravenous Q12H   Continuous Infusions: . sodium chloride 125 mL/hr at 03/05/13 1110    Principal Problem:   CAP (community acquired pneumonia) Active Problems:   Atrial fibrillation   Hypertension   Hyperlipidemia   Alcoholic cirrhosis of liver with ascites   Hypothyroidism    Time spent: 25 min    Braylen Denunzio  [facility] Pager [phone]. If 7PM-7AM, please contact night-coverage at [email or web address], password [facility] 03/05/2013, 4:40 PM  LOS: 2 days

## 2013-03-06 ENCOUNTER — Inpatient Hospital Stay (HOSPITAL_COMMUNITY): Payer: Medicare PPO

## 2013-03-06 LAB — T4, FREE: Free T4: 1.09 ng/dL (ref 0.80–1.80)

## 2013-03-06 MED ORDER — ACETAMINOPHEN 325 MG PO TABS
650.0000 mg | ORAL_TABLET | Freq: Four times a day (QID) | ORAL | Status: DC | PRN
  Administered 2013-03-06 – 2013-03-09 (×4): 650 mg via ORAL
  Filled 2013-03-06 (×4): qty 2

## 2013-03-06 NOTE — Progress Notes (Signed)
[facility] PROGRESS NOTE  Raymond Bibby Sr. ZOX:[id number] DOB: 11/18/1922 DOA: 03/03/2013 PCP: Jeoffrey Massed, MD Brief  HPI: Raymond Levee Sr. is an 79 y.o. male lives at home with his elderly wife, having hx of HTN, hyperlipidemia, hx of afib, prior hx of alcoholism with ascites, presents to the ER via EMS with hx of fever of 101, coughs of yellow sputum, and feeling malaise. He was seen by Dr [name], diagnosed with bronchitis, and was placed on Augmentin with steroids a few weeks ago. He hadn't gotten better. He has no chest pain, abdominal pain, nausea or vomiting. He reportedly had foul smelling urine at home. He had hx of prostatitis in the past, but hadn't complaint of any prostatism. Evaluation in the ER included a CXR which showed interstitial edema, PNA would have similar appearance, leukocytosis with WBC of 14K, and normal renal fx tests. His UA was negative. Hospitalist was asked to admit him for the above reasons suggestive of pneumonia.  Assessment/Plan: 1. CAP: - admitted to telemetry and started him on IV levaquin.  - repeat CXR in am.  - blood cultures and sputum cultures ordered.   2. ATrial fibrillation: - rate controlled - resume metoprolol at home dose.   3. Hypertension:  - controlled.   4. Hypothyroidism: - continue with synthroid.   5. Dysphagia: on dysphagia 2 diet. Thin liquids.   6. Right shoulder pain: unclear etiology. Will order CT of the right shoulder.   DVT prophylaxis.   Code Status: full code Family Communication: none at bedside Disposition Plan: pending PT eval.   Consultants: none Procedures:  none  Antibiotics:  LEVAQUIN 11/11  HPI/Subjective: Comfortable. Reports right shoulder pain.   Objective: Filed Vitals:   03/06/13 1337  BP: 145/69  Pulse: 76  Temp: 98.5 F (36.9 C)  Resp: 20    Intake/Output Summary (Last 24 hours) at 03/06/13 1427 Last data filed at 03/06/13 0920  Gross per 24 hour  Intake     30 ml   Output      0 ml  Net     30 ml   Filed Weights   03/04/13 0450  Weight: 61.8 kg (136 lb 3.9 oz)    Exam:   General:  Alert afebrile comfortable  Cardiovascular: s1s2  Respiratory: ctab  Abdomen: soft NT ND BS+  Musculoskeletal: no pedal edema.   Data Reviewed: Basic Metabolic Panel:  Recent Labs Lab 03/04/13 0040 03/04/13 0059 03/04/13 0645 03/05/13 0457  NA 135 138  --  133*  K 4.0 4.6  --  4.0  CL 106 107  --  107  CO2 21  --   --  20  GLUCOSE 94 85  --  80  BUN 19 26*  --  17  CREATININE 0.75 1.00 0.75 0.81  CALCIUM 9.4  --   --  9.0   Liver Function Tests:  Recent Labs Lab 03/04/13 0040  AST 41*  ALT 15  ALKPHOS 179*  BILITOT 1.8*  PROT 5.7*  ALBUMIN 2.8*   No results found for this basename: LIPASE, AMYLASE,  in the last 168 hours No results found for this basename: AMMONIA,  in the last 168 hours CBC:  Recent Labs Lab 03/04/13 0040 03/04/13 0059 03/04/13 0645 03/05/13 0457  WBC 14.4*  --  11.7* 9.7  NEUTROABS  --   --   --  6.0  HGB 11.8* 12.2* 10.5* 9.9*  HCT 33.7* 36.0* 30.3* 28.3*  MCV 91.8  --  91.8 91.6  PLT 86*  --  75* 88*   Cardiac Enzymes: No results found for this basename: CKTOTAL, CKMB, CKMBINDEX, TROPONINI,  in the last 168 hours BNP (last 3 results) No results found for this basename: PROBNP,  in the last 8760 hours CBG: No results found for this basename: GLUCAP,  in the last 168 hours  Recent Results (from the past 240 hour(s))  URINE CULTURE     Status: None   Collection Time    03/04/13  1:00 AM      Result Value Range Status   Specimen Description URINE, CATHETERIZED   Final   Special Requests NONE   Final   Culture  Setup Time     Final   Value: 03/04/2013 12:25     Performed at [facility]   Culture     Final   Value: NO GROWTH     Performed at [facility]   Report Status 03/05/2013 FINAL   Final  CULTURE, BLOOD (ROUTINE X 2)     Status: None   Collection Time    03/04/13  6:45  AM      Result Value Range Status   Specimen Description BLOOD LEFT ARM   Final   Special Requests BOTTLES DRAWN AEROBIC AND ANAEROBIC 5CC   Final   Culture  Setup Time     Final   Value: 03/04/2013 08:38     Performed at [facility]   Culture     Final   Value:        BLOOD CULTURE RECEIVED NO GROWTH TO DATE CULTURE WILL BE HELD FOR 5 DAYS BEFORE ISSUING A FINAL NEGATIVE REPORT     Performed at [facility]   Report Status PENDING   Incomplete  CULTURE, BLOOD (ROUTINE X 2)     Status: None   Collection Time    03/04/13  6:50 AM      Result Value Range Status   Specimen Description BLOOD LEFT HAND   Final   Special Requests BOTTLES DRAWN AEROBIC AND ANAEROBIC 5CC   Final   Culture  Setup Time     Final   Value: 03/04/2013 08:38     Performed at [facility]   Culture     Final   Value:        BLOOD CULTURE RECEIVED NO GROWTH TO DATE CULTURE WILL BE HELD FOR 5 DAYS BEFORE ISSUING A FINAL NEGATIVE REPORT     Performed at [facility]   Report Status PENDING   Incomplete     Studies: Dg Swallowing Func-speech Pathology  03/05/2013   Chales Abrahams, CCC-SLP     03/05/2013  2:08 PM Objective Swallowing Evaluation: Modified Barium Swallowing Study   Patient Details  Name: Raymond Demarais Sr. MRN: [id number] Date of Birth: 1922-06-11  Today's Date: 03/05/2013 Time: 1135-1203 SLP Time Calculation (min): 28 min  Past Medical History:  Past Medical History  Diagnosis Date  . Arthritis   . Hyperlipidemia   . Hypertension   . A-fib     Dr Donnie Aho; WAS on Coumadin until 04/2012  . Angina     uses NTG prn  . Pneumonia     12 /2012  . Recurrent upper respiratory infection (URI)     03/2011  . Hypothyroidism     x 10 yrs  . GERD (gastroesophageal reflux disease)   . Constipation, chronic   . Depression   . History of dizziness   .  Anxiety   . Urinary frequency   . Hearing loss of both ears     wears hearing aides  . BPH with obstruction/lower urinary tract symptoms     sees  Dr Patsi Sears  . Compression fracture of thoracic vertebra, non-traumatic  06/14/12    T6 and T7 (noted on CT angio chest to r/o PE)  . Alcoholic cirrhosis of liver with ascites 06/14/12    Noted on CT angio chest to r/o PE  . Esophageal varices in alcoholic cirrhosis "   "    "   "    "   Past Surgical History:  Past Surgical History  Procedure Laterality Date  . Total knee arthroplasty  1994    right  . Total shoulder replacement  1997    right  . Joint replacement      Rt knee and shoulder  . Hernia repair  1990    LIH repair  . Cholecystectomy  2004  . Inguinal hernia repair  05/09/2011    Procedure: HERNIA REPAIR INGUINAL ADULT;  Surgeon: Adolph Pollack, MD;  Location: [facility] OR;  Service: General;  Laterality:  Right;   HPI:  Raymond Mangas Sr. is an 79 y.o. male lives at home with his elderly  wife, having hx of HTN, hyperlipidemia, hx of afib, prior hx of  alcoholism with ascites, presents to the ER via EMS with hx of  fever of 101, coughs of yellow sputum, and feeling malaise.  He  was seen by Dr [name], diagnosed with bronchitis, and was placed  on Augmentin with steroids a few weeks ago.  He hadn't gotten  better.  He has no chest pain, abdominal pain, nausea or  vomiting.  He reportedly had foul smelling urine at home.  He had  hx of prostatitis in the past, but hadn't complaint of any  prostatism.  Evaluation in the ER included a CXR which showed  interstitial edema, PNA would have similar appearance,  leukocytosis with WBC of 14K, and normal renal fx tests. His UA  was negative.  BSE completed revealing concern for multifactorial  dysphagia and aspiraiton.  MBS recommended to allow determination  for best diet and strategies to mitigate aspiration.       Assessment / Plan / Recommendation Clinical Impression  Dysphagia Diagnosis: Moderate oral phase dysphagia;Mild  pharyngeal phase dysphagia;Suspected primary esophageal  dysphagia;Moderate cervical esophageal phase dysphagia  Clinical impression: Pt presents  with moderate oral, mild  pharyngeal and moderate cervical esophageal phase dysphagia.   Pt's dysphagia characterized by oral weakness resulting in  delayed oral transiting and lingual pumping.    Pharyngeal stasis  present primarily due to decreased UES opening and known Zenker's  diverticulum  (seen on barium swallow in 2011).  Laryngeal  penetration of thin liquid noted x3- but adequately cleared with  CUED throat clear and dry swallow.    Please note pt DID NOT sense pharyngeal or esophageal stasis  during testing, which will increase his risk.  Pt was able to  conduct dry swallows and propel barium stasis from pharynx into  oral cavity and expectorate to maximize airway protection.   Pt produces independent breathhold with double swallow while  consuming liquids, SLP suspects this is a compensation strategy  created by pt due to chronicity of dysphagia.  Suspect dysphagia  may be worsened now due to current medical issue/weakness and  follow up SLP, ? HH may be helpful to strengthen pt's oral  swallow and aid  generalization of compensation strategies.      Rec to return to Dys2 diet due to weakness and advance liquids to  thin with strict precautions to mitigate aspiration.   Increased  viscocity of liquids may further impair esophageal clearance and  aspiration was prevented with strict compensation strategies.       Note, pt did cough during MBS but no barium was visualized in  larynx/trachea at that time.      Treatment Recommendation  Therapy as outlined in treatment plan below    Diet Recommendation Dysphagia 2 (Fine chop);Thin liquid   Liquid Administration via: Cup Medication Administration: Crushed with puree Supervision: Patient able to self feed;Full supervision/cueing  for compensatory strategies Compensations: Slow rate;Small sips/bites;Multiple dry swallows  after each bite/sip;Clear throat after each swallow (hock to  clear pharyngeal stasis at end of meal) Postural Changes and/or Swallow  Maneuvers: Seated upright 90  degrees;Upright 30-60 min after meal    Other  Recommendations Oral Care Recommendations: Oral care BID   Follow Up Recommendations  24 hour supervision/assistance;Home health SLP    Frequency and Duration min 2x/week  2 weeks   Pertinent Vitals/Pain Afebrile, decreased    SLP Swallow Goals  see care plan   General Date of Onset: 03/05/13 HPI: Raymond Gertz Sr. is an 79 y.o. male lives at home with his  elderly wife, having hx of HTN, hyperlipidemia, hx of afib, prior  hx of alcoholism with ascites, presents to the ER via EMS with hx  of fever of 101, coughs of yellow sputum, and feeling malaise.   He was seen by Dr [name], diagnosed with bronchitis, and was  placed on Augmentin with steroids a few weeks ago.  He hadn't  gotten better.  He has no chest pain, abdominal pain, nausea or  vomiting.  He reportedly had foul smelling urine at home.  He had  hx of prostatitis in the past, but hadn't complaint of any  prostatism.  Evaluation in the ER included a CXR which showed  interstitial edema, PNA would have similar appearance,  leukocytosis with WBC of 14K, and normal renal fx tests. His UA  was negative.  BSE completed revealing concern for multifactorial  dysphagia and aspiraiton.  MBS recommended to allow determination  for best diet and strategies to mitigate aspiration.   Type of Study: Modified Barium Swallowing Study Reason for Referral: Objectively evaluate swallowing function Previous Swallow Assessment: none Diet Prior to this Study: Dysphagia 2 (chopped);Honey-thick  liquids Respiratory Status: Room air History of Recent Intubation: No Behavior/Cognition: Alert;Cooperative;Pleasant mood;Requires  cueing;Decreased sustained attention;Hard of hearing Oral Cavity - Dentition: Dentures, top;Missing dentition (no  lower dentures) Oral Motor / Sensory Function: Impaired - see Bedside swallow  eval Self-Feeding Abilities: Able to feed self;Needs set up (right  shoulder issues) Patient  Positioning: Upright in bed Baseline Vocal Quality: Wet;Low vocal intensity Volitional Cough: Weak Volitional Swallow: Able to elicit Anatomy: Within functional limits Pharyngeal Secretions: Not observed secondary MBS    Reason for Referral Objectively evaluate swallowing function   Oral Phase Oral Preparation/Oral Phase Oral Phase: Impaired Oral - Nectar Oral - Nectar Teaspoon: Lingual pumping;Delayed oral  transit;Reduced posterior propulsion;Weak lingual manipulation Oral - Nectar Cup: Weak lingual manipulation;Delayed oral  transit;Lingual pumping;Reduced posterior propulsion Oral - Thin Oral - Thin Teaspoon: Reduced posterior propulsion;Lingual  pumping;Weak lingual manipulation;Delayed oral transit Oral - Thin Cup: Lingual/palatal residue;Reduced posterior  propulsion;Lingual pumping;Weak lingual manipulation Oral - Thin Straw: Reduced posterior propulsion;Weak lingual  manipulation;Delayed oral transit Oral -  Solids Oral - Puree: Reduced posterior propulsion;Delayed oral  transit;Lingual pumping;Weak lingual manipulation Oral - Regular: Impaired mastication;Reduced posterior  propulsion;Weak lingual manipulation;Lingual pumping;Delayed oral  transit   Pharyngeal Phase Pharyngeal Phase Pharyngeal Phase: Impaired Pharyngeal - Thin Pharyngeal - Thin Teaspoon: Pharyngeal residue - pyriform  sinuses;Pharyngeal residue - valleculae Pharyngeal - Thin Cup: Delayed swallow initiation;Pharyngeal  residue - valleculae;Pharyngeal residue - pyriform sinuses Pharyngeal - Thin Straw: Delayed swallow initiation;Premature  spillage to pyriform sinuses;Premature spillage to  valleculae;Pharyngeal residue - pyriform sinuses;Pharyngeal  residue - valleculae Pharyngeal - Solids Pharyngeal - Puree: Pharyngeal residue - valleculae;Pharyngeal  residue - pyriform sinuses Pharyngeal - Regular: Pharyngeal residue - valleculae;Pharyngeal  residue - pyriform sinuses Pharyngeal Phase - Comment Pharyngeal Comment: head turn right (to weak  side) nor chin tuck  helpful to prevent stasis, CUED dry swallows effective to  decrease stasis, pt able to "hock" to aid pharyngeal clearance  that pt did not sense  Cervical Esophageal Phase    GO    Cervical Esophageal Phase Cervical Esophageal Phase: Impaired Cervical Esophageal Phase - Nectar Nectar Teaspoon: Reduced cricopharyngeal relaxation Nectar Cup: Reduced cricopharyngeal relaxation;Esophageal  backflow into the pharynx Cervical Esophageal Phase - Thin Thin Teaspoon: Reduced cricopharyngeal relaxation Thin Cup: Reduced cricopharyngeal relaxation;Esophageal backflow  into the pharynx Thin Straw: Reduced cricopharyngeal relaxation Cervical Esophageal Phase - Solids Puree: Reduced cricopharyngeal relaxation Regular: Reduced cricopharyngeal relaxation Cervical Esophageal Phase - Comment Cervical Esophageal Comment: pt belching during MBS with backflow  x2 observed, appearance of Zenker's diverticulum noted with  delayed clearance distally with ? findings consistent with spasm  WITHOUT pt sensation- CUED dry swallow aided esophageal  clearance- radiologist not present to confirm findings,   esophagram completed in 2011 in epic for MD to review if desire         Raymond Burnet, MS [facility] SLP [phone]     Scheduled Meds: . aspirin  81 mg Oral Daily  . docusate sodium  100 mg Oral BID  . fenofibrate  160 mg Oral Daily  . finasteride  5 mg Oral Daily  . guaiFENesin  600 mg Oral BID  . levofloxacin (LEVAQUIN) IV  750 mg Intravenous Q24H  . levothyroxine  88 mcg Oral QAC breakfast  . metoprolol tartrate  25 mg Oral BID  . mirtazapine  30 mg Oral QHS  . omega-3 acid ethyl esters  1 g Oral Daily  . pantoprazole  40 mg Oral Daily  . polyethylene glycol  17 g Oral BID  . simvastatin  20 mg Oral q1800  . sodium chloride  3 mL Intravenous Q12H   Continuous Infusions:    Principal Problem:   CAP (community acquired pneumonia) Active Problems:   Atrial fibrillation   Hypertension   Hyperlipidemia    Alcoholic cirrhosis of liver with ascites   Hypothyroidism    Time spent: 25 min    Raymond Larsen  [facility] Pager [phone]. If 7PM-7AM, please contact night-coverage at [email or web address], password [facility] 03/06/2013, 2:27 PM  LOS: 3 days

## 2013-03-06 NOTE — Evaluation (Signed)
Physical Therapy Evaluation Patient Details Name: Raymond Alen Sr. MRN: [id number] DOB: 06-05-1922 Today's Date: 03/06/2013 Time: 4098-1191 PT Time Calculation (min): 26 min  PT Assessment / Plan / Recommendation History of Present Illness  Pt is an 79 y.o. male lives at home with his elderly wife, having hx of HTN, hyperlipidemia, hx of afib, prior hx of alcoholism with ascites, presents to the ER via EMS with hx of fever of 101, coughs of yellow sputum, and feeling malaise.  He was seen by Dr [name], diagnosed with bronchitis, and was placed on Augmentin with steroids a few weeks ago.  He hadn't gotten better.  He has no chest pain, abdominal pain, nausea or vomiting.  He reportedly had foul smelling urine at home.  He had hx of prostatitis in the past, but hadn't complaint of any prostatism.  Evaluation in the ER included a CXR which showed interstitial edema, PNA would have similar appearance, leukocytosis with WBC of 14K, and normal renal fx tests. His UA was negative.  Hospitalist was asked to admit him for the above reasons suggestive of pneumonia.  R humerus imaging negative for fracture.  Clinical Impression  Pt admitted for above. Pt currently presenting with functional limitations due to deficits listed below (see PT Problem List). Pt would benefit from skilled PT to increase independence and safety during mobility and to allow d/c to venue below. Pt able to ambulate 20' with RW and min assist, pt limited due to fatigue and weakness. Pt poor historian, will need family to clarify the amount of assist available at home. PT recommends SNF at this time, to improve strength, endurance, and safety.    PT Assessment  Patient needs continued PT services    Follow Up Recommendations  SNF;Supervision/Assistance - 24 hour    Does the patient have the potential to tolerate intense rehabilitation      Barriers to Discharge   need to verify assist available at home, as pt is a poor historian.     Equipment Recommendations  None recommended by PT    Recommendations for Other Services     Frequency Min 3X/week    Precautions / Restrictions Precautions Precautions: Fall Precaution Comments: pt c/o R shoulder pain but imaging negative for fracture. Restrictions Weight Bearing Restrictions: No   Pertinent Vitals/Pain Pt reports R shoulder pain during mobility but unable to rate. Pt wished to continue with ambulation and pt positioned to comfort at end of session. No c/o SOB/dizziness during session.      Mobility  Bed Mobility Bed Mobility: Supine to Sit;Sitting - Scoot to [place] of Bed;Sit to Supine Supine to Sit: 4: Min assist Sitting - Scoot to [place] of Bed: 4: Min assist Sit to Supine: 3: Mod assist Details for Bed Mobility Assistance: assist to guide trunk into sitting due to weakness and R shoulder pain. mod A during sit to supine to guide trunk and assist LEs into bed. PT utilized bed pad to shift hips towards EOB during scooting. Pt required increased time during all mobility as pt easily distracted and required re-direction. Transfers Transfers: Sit to Stand;Stand to Sit Sit to Stand: 4: Min assist;From bed;With upper extremity assist Stand to Sit: 4: Min guard;With upper extremity assist;To bed Details for Transfer Assistance: assist to rise and steady and min guard to control descent. VC's for hand placement. Ambulation/Gait Ambulation/Gait Assistance: 4: Min assist Ambulation Distance (Feet): 20 Feet Assistive device: Rolling walker Ambulation/Gait Assistance Details: assist to steady pt during ambulation and to guide  RW. VC's for gait sequence, upright posture, and to move LLE as pt had difficulty picking up LLE. pt required several standing rest breaks during ambulation due to fatigue. Gait Pattern: Step-to pattern;Decreased stride length;Trunk flexed;Decreased dorsiflexion - left;Decreased dorsiflexion - right Gait velocity: decreased General Gait Details: pt with  decreased DF (L>R) during ambulation    Exercises     PT Diagnosis: Difficulty walking;Generalized weakness  PT Problem List: Decreased strength;Decreased range of motion;Decreased activity tolerance;Decreased balance;Decreased mobility;Decreased knowledge of use of DME;Decreased safety awareness PT Treatment Interventions: DME instruction;Gait training;Functional mobility training;Therapeutic activities;Therapeutic exercise;Balance training;Neuromuscular re-education;Patient/family education     PT Goals(Current goals can be found in the care plan section) Acute Rehab PT Goals Patient Stated Goal: to get stronger so he can see his wife PT Goal Formulation: With patient Time For Goal Achievement: 03/20/13 Potential to Achieve Goals: Good  Visit Information  Last PT Received On: 03/06/13 Assistance Needed: +1 History of Present Illness: Pt is an 79 y.o. male lives at home with his elderly wife, having hx of HTN, hyperlipidemia, hx of afib, prior hx of alcoholism with ascites, presents to the ER via EMS with hx of fever of 101, coughs of yellow sputum, and feeling malaise.  He was seen by Dr [name], diagnosed with bronchitis, and was placed on Augmentin with steroids a few weeks ago.  He hadn't gotten better.  He has no chest pain, abdominal pain, nausea or vomiting.  He reportedly had foul smelling urine at home.  He had hx of prostatitis in the past, but hadn't complaint of any prostatism.  Evaluation in the ER included a CXR which showed interstitial edema, PNA would have similar appearance, leukocytosis with WBC of 14K, and normal renal fx tests. His UA was negative.  Hospitalist was asked to admit him for the above reasons suggestive of pneumonia.  R humerus imaging negative for fracture.       Prior Functioning  Home Living Family/patient expects to be discharged to:: Private residence Living Arrangements: Spouse/significant other Available Help at Discharge: Family;Personal care  attendant;Available 24 hours/day Type of Home: House Home Access:  (pt not able to state if he has stairs to enter home) Home Layout: Two level;Able to live on main level with bedroom/bathroom Home Equipment: Walker - 4 wheels;Walker - 2 wheels;Cane - single point;Shower seat;Bedside commode Prior Function Level of Independence: Independent with assistive device(s) Comments: pt reports he was able to ambulate ind.with rollator and ind. in all  ADLs. However, pt also reports his wife has 24/7 caregivers that occassionally assist him prn. Communication Communication: No difficulties    Cognition  Cognition Arousal/Alertness: Awake/alert Behavior During Therapy: WFL for tasks assessed/performed Overall Cognitive Status: No family/caregiver present to determine baseline cognitive functioning Area of Impairment: Memory;Problem solving Memory: Decreased short-term memory Problem Solving: Difficulty sequencing;Requires verbal cues;Requires tactile cues;Slow processing General Comments: pt required max verbal/tactile cues to re-direct to stay on task, pt also had decreased short term memory and was a poor historian during history.    Extremity/Trunk Assessment Lower Extremity Assessment Lower Extremity Assessment: Generalized weakness   Balance    End of Session PT - End of Session Activity Tolerance: Patient limited by fatigue Patient left: in bed;with bed alarm set;with call bell/phone within reach  GP     Sol Blazing 03/06/2013, 10:23 AM

## 2013-03-06 NOTE — Progress Notes (Signed)
Speech Language Pathology Treatment: Dysphagia  Patient Details Name: Raymond Hanawalt Sr. MRN: [id number] DOB: 06-18-1922 Today's Date: 03/06/2013 Time: 4098-1191 SLP Time Calculation (min): 23 min  Assessment / Plan / Recommendation Clinical Impression  Pt seen to assess tolerance of po diet, dys2/thin, and use of compensation strategies.  Pt found eating lunch alone, SLP advised RN that pt needs full assistance due to cognitive and sensorimotor deficits.  SLP observed pt consuming icecream and water - he required maximum cues to conduct dry swallows and clear his throat for airway protection- although he is able to verbalize techniques.  Pt will need full assist due to level of dysphagia, cognitive deficit and sensory deficit.    Intake documented as 75% and pt is currently afebrile.    Note pt for repeat CXR tomorrow.  Rec continue diet with full assistance/supervision and follow up with SLP at next venue to continue dysphagia management/oropharyngeal strengthening.    SLP to follow while in acute care, no family has been present to educate to testing results and precautions.      HPI HPI: Raymond Haugan Sr. is an 79 y.o. male lives at home with his elderly wife, having hx of HTN, hyperlipidemia, hx of afib, prior hx of alcoholism with ascites, presents to the ER via EMS with hx of fever of 101, coughs of yellow sputum, and feeling malaise.  He was seen by Dr [name], diagnosed with bronchitis, and was placed on Augmentin with steroids a few weeks ago.  He hadn't gotten better.  He has no chest pain, abdominal pain, nausea or vomiting.  He reportedly had foul smelling urine at home.  He had hx of prostatitis in the past, but hadn't complaint of any prostatism.  Evaluation in the ER included a CXR which showed interstitial edema, PNA would have similar appearance, leukocytosis with WBC of 14K, and normal renal fx tests. His UA was negative.  BSE completed revealing concern for multifactorial dysphagia and  aspiraiton.  MBS completed with recommendations for dys2/thin diet with strict aspiration precautions.  SLP follow up for diet tolerance and reinforcement of swallow precautions.     Pertinent Vitals Today afebrile, decreased  SLP Plan  Continue with current plan of care    Recommendations Diet recommendations: Dysphagia 2 (fine chop);Thin liquid Liquids provided via: Cup Medication Administration: Crushed with puree Supervision: Patient able to self feed;Full supervision/cueing for compensatory strategies Compensations: Slow rate;Small sips/bites;Multiple dry swallows after each bite/sip;Clear throat intermittently (hock up to clear throat at end of meal/snack) Postural Changes and/or Swallow Maneuvers: Seated upright 90 degrees;Upright 30-60 min after meal              Oral Care Recommendations: Oral care BID Follow up Recommendations: 24 hour supervision/assistance;Home health SLP Plan: Continue with current plan of care    GO     Donavan Burnet, MS [facility] SLP [phone]

## 2013-03-06 NOTE — Evaluation (Signed)
I have reviewed this note and agree with all findings. Kati Remmington Teters, PT, DPT Pager: [phone]   

## 2013-03-07 ENCOUNTER — Inpatient Hospital Stay (HOSPITAL_COMMUNITY): Payer: Medicare PPO

## 2013-03-07 MED ORDER — LEVOFLOXACIN 750 MG PO TABS
750.0000 mg | ORAL_TABLET | ORAL | Status: DC
  Administered 2013-03-07 – 2013-03-10 (×3): 750 mg via ORAL
  Filled 2013-03-07 (×4): qty 1

## 2013-03-07 NOTE — Progress Notes (Signed)
Clinical Social Work Department BRIEF PSYCHOSOCIAL ASSESSMENT 03/07/2013  Patient:  Raymond Larsen, Raymond Larsen     Account Number:  [id number]     Admit date:  03/03/2013  Clinical Social Worker:  Orpah Greek  Date/Time:  03/07/2013 11:37 AM  Referred by:  Physician  Date Referred:  03/07/2013 Referred for  SNF Placement   Other Referral:   Interview type:  Family Other interview type:   patient's daughter, [name]    PSYCHOSOCIAL DATA Living Status:  WIFE Admitted from facility:   Level of care:   Primary support name:  Tawni Millers (daughter) h#: [phone] c#: [phone] Primary support relationship to patient:  CHILD, ADULT Degree of support available:   good    CURRENT CONCERNS Current Concerns  Post-Acute Placement   Other Concerns:    SOCIAL WORK ASSESSMENT / PLAN CSW reviewed PT evaluation recommending SNF/24 hour assistance at discharge.   Assessment/plan status:  Information/Referral to [facility] Other assessment/ plan:   Information/referral to community resources:   CSW completed FL2 and faxed information out to [facility] - provided bed offers to patient's daughter.    PATIENT'S/FAMILY'S RESPONSE TO PLAN OF CARE: Patient's daughter, Raymond Larsen informed CSW that patient was a primary caregiver for his wife who has dementia. The couple also has 24-hour private duty caregivers at the house as well. Per daughter she will discuss with the family SNF vs. home but has agreed to have information faxed out to see which bed offers patient gets.       Unice Bailey, LCSW [facility] Clinical Social Worker cell #: [phone]

## 2013-03-07 NOTE — Progress Notes (Addendum)
CSW spoke with patient's daughter, Raymond Larsen (cell#: [phone]) re: SNF bed offers. Daughter was not pleased with the choices she had - [facility] & [facility], due to patient's insurance - [employer] and facilities not having contracts with them.   Daughter states she spoke with the rest of the family and they are all in agreement to take him back home with his wife & the 24 hour private duty caregivers. RNCM, Raymond Larsen & Dr. Blake Divine made aware. Anticipating discharge today.   No other CSW needs identified - CSW signing off.   Clinical Social Work Department CLINICAL SOCIAL WORK PLACEMENT NOTE 03/07/2013  Patient:  Raymond Larsen, Raymond Larsen  Account Number:  [id number] Admit date:  03/03/2013  Clinical Social Worker:  Orpah Greek  Date/time:  03/07/2013 11:44 AM  Clinical Social Work is seeking post-discharge placement for this patient at the following level of care:   SKILLED NURSING   (*CSW will update this form in Epic as items are completed)   03/07/2013  Patient/family provided with [facility] Department of Clinical Social Work's list of facilities offering this level of care within the geographic area requested by the patient (or if unable, by the patient's family).  03/07/2013  Patient/family informed of their freedom to choose among providers that offer the needed level of care, that participate in Medicare, Medicaid or managed care program needed by the patient, have an available bed and are willing to accept the patient.  03/07/2013  Patient/family informed of [facility]' ownership interest in [facility], as well as of the fact that they are under no obligation to receive care at this facility.  PASARR submitted to EDS on 03/07/2013 PASARR number received from EDS on 03/07/2013  FL2 transmitted to all facilities in geographic area requested by pt/family on  03/07/2013 FL2 transmitted to all facilities within larger geographic area on   Patient informed that  his/her managed care company has contracts with or will negotiate with  certain facilities, including the following:   [facility]     Patient/family informed of bed offers received:  03/07/2013 Patient chooses bed at  Physician recommends and patient chooses bed at    Patient to be transferred to  on   Patient to be transferred to facility by   The following physician request were entered in Epic:   Additional Comments:     Raymond Bailey, LCSW [facility] Clinical Social Worker cell #: [phone]

## 2013-03-07 NOTE — Progress Notes (Signed)
[facility] PROGRESS NOTE  Raymond Walle Sr. ZOX:[id number] DOB: 1922/11/20 DOA: 03/03/2013 PCP: Jeoffrey Massed, MD Brief  HPI: Raymond Yeley Sr. is an 79 y.o. male lives at home with his elderly wife, having hx of HTN, hyperlipidemia, hx of afib, prior hx of alcoholism with ascites, presents to the ER via EMS with hx of fever of 101, coughs of yellow sputum, and feeling malaise. He was seen by Dr [name], diagnosed with bronchitis, and was placed on Augmentin with steroids a few weeks ago. He hadn't gotten better. He has no chest pain, abdominal pain, nausea or vomiting. He reportedly had foul smelling urine at home. He had hx of prostatitis in the past, but hadn't complaint of any prostatism. Evaluation in the ER included a CXR which showed interstitial edema, PNA would have similar appearance, leukocytosis with WBC of 14K, and normal renal fx tests. His UA was negative. Hospitalist was asked to admit him for the above reasons suggestive of pneumonia.  Assessment/Plan: 1. CAP: - admitted to telemetry and started him on IV levaquin.  - repeat CXR in am shows resolution of pneumonia.  - blood cultures negative .   2. ATrial fibrillation: - rate controlled - resume metoprolol at home dose.   3. Hypertension:  - controlled.   4. Hypothyroidism: - continue with synthroid.   5. Dysphagia: on dysphagia 2 diet. Thin liquids.   6. Right shoulder pain: CT of the shoulder shows 2nd rib fractures. Orthopedics consulted , no surgical intervention. Pain control PT eval recommended SNF.   DVT prophylaxis.   Code Status: full code Family Communication: none at bedside, spoke to daughter over the phone.  Disposition Plan: snf.    Consultants: none Procedures:  none  Antibiotics:  LEVAQUIN 11/11  HPI/Subjective: Comfortable. Reports right shoulder pain.   Objective: Filed Vitals:   03/07/13 1417  BP: 105/52  Pulse: 65  Temp: 98.1 F (36.7 C)  Resp: 18    Intake/Output  Summary (Last 24 hours) at 03/07/13 1739 Last data filed at 03/07/13 1417  Gross per 24 hour  Intake    480 ml  Output   1725 ml  Net  -1245 ml   Filed Weights   03/04/13 0450  Weight: 61.8 kg (136 lb 3.9 oz)    Exam:   General:  Alert afebrile comfortable  Cardiovascular: s1s2  Respiratory: ctab  Abdomen: soft NT ND BS+  Musculoskeletal: no pedal edema.   Data Reviewed: Basic Metabolic Panel:  Recent Labs Lab 03/04/13 0040 03/04/13 0059 03/04/13 0645 03/05/13 0457  NA 135 138  --  133*  K 4.0 4.6  --  4.0  CL 106 107  --  107  CO2 21  --   --  20  GLUCOSE 94 85  --  80  BUN 19 26*  --  17  CREATININE 0.75 1.00 0.75 0.81  CALCIUM 9.4  --   --  9.0   Liver Function Tests:  Recent Labs Lab 03/04/13 0040  AST 41*  ALT 15  ALKPHOS 179*  BILITOT 1.8*  PROT 5.7*  ALBUMIN 2.8*   No results found for this basename: LIPASE, AMYLASE,  in the last 168 hours No results found for this basename: AMMONIA,  in the last 168 hours CBC:  Recent Labs Lab 03/04/13 0040 03/04/13 0059 03/04/13 0645 03/05/13 0457  WBC 14.4*  --  11.7* 9.7  NEUTROABS  --   --   --  6.0  HGB 11.8* 12.2* 10.5* 9.9*  HCT  33.7* 36.0* 30.3* 28.3*  MCV 91.8  --  91.8 91.6  PLT 86*  --  75* 88*   Cardiac Enzymes: No results found for this basename: CKTOTAL, CKMB, CKMBINDEX, TROPONINI,  in the last 168 hours BNP (last 3 results) No results found for this basename: PROBNP,  in the last 8760 hours CBG: No results found for this basename: GLUCAP,  in the last 168 hours  Recent Results (from the past 240 hour(s))  URINE CULTURE     Status: None   Collection Time    03/04/13  1:00 AM      Result Value Range Status   Specimen Description URINE, CATHETERIZED   Final   Special Requests NONE   Final   Culture  Setup Time     Final   Value: 03/04/2013 12:25     Performed at [facility]   Culture     Final   Value: NO GROWTH     Performed at [facility]   Report  Status 03/05/2013 FINAL   Final  CULTURE, BLOOD (ROUTINE X 2)     Status: None   Collection Time    03/04/13  6:45 AM      Result Value Range Status   Specimen Description BLOOD LEFT ARM   Final   Special Requests BOTTLES DRAWN AEROBIC AND ANAEROBIC 5CC   Final   Culture  Setup Time     Final   Value: 03/04/2013 08:38     Performed at [facility]   Culture     Final   Value:        BLOOD CULTURE RECEIVED NO GROWTH TO DATE CULTURE WILL BE HELD FOR 5 DAYS BEFORE ISSUING A FINAL NEGATIVE REPORT     Performed at [facility]   Report Status PENDING   Incomplete  CULTURE, BLOOD (ROUTINE X 2)     Status: None   Collection Time    03/04/13  6:50 AM      Result Value Range Status   Specimen Description BLOOD LEFT HAND   Final   Special Requests BOTTLES DRAWN AEROBIC AND ANAEROBIC 5CC   Final   Culture  Setup Time     Final   Value: 03/04/2013 08:38     Performed at [facility]   Culture     Final   Value:        BLOOD CULTURE RECEIVED NO GROWTH TO DATE CULTURE WILL BE HELD FOR 5 DAYS BEFORE ISSUING A FINAL NEGATIVE REPORT     Performed at [facility]   Report Status PENDING   Incomplete     Studies: Dg Chest 2 View  03/07/2013   CLINICAL DATA:  Resolution of pneumonia.  EXAM: CHEST  2 VIEW  COMPARISON:  Chest radiograph of March 04, 2013. CT scan of June 13, 2012.  FINDINGS: Cardiomediastinal silhouette is within normal limits. The vascular congestion noted on prior exam appears to be significantly improved. No pneumothorax or pleural effusion is noted. Status post right shoulder arthroplasty. No acute pulmonary disease is noted. Compression deformities of several mid to lower thoracic vertebral bodies are noted which are unchanged compared to prior CT scan.  IMPRESSION: No active cardiopulmonary disease.   Electronically Signed   By: Roque Lias M.D.   On: 03/07/2013 08:09   Ct Shoulder Right Wo Contrast  03/06/2013   CLINICAL DATA:  Severe  right shoulder pain. Previous right shoulder replacement.  EXAM: CT OF THE  RIGHT SHOULDER WITHOUT CONTRAST  TECHNIQUE: Multidetector CT imaging was performed according to the standard protocol. Multiplanar CT image reconstructions were also generated.  COMPARISON:  03/04/2013  FINDINGS: There is a subtle minimally displaced fracture of the lateral aspect of the right 2nd rib at the level of the axilla. This may be the source of the patient's pain.  There is no evidence of a fracture of the scapula or proximal humerus. The prosthesis demonstrates no evidence of loosening. There is a small effusion in the glenohumeral joint. The prosthesis is superiorly migrated and now articulates with the undersurface of the acromion. This is chronic.  IMPRESSION: Probable acute fracture of the lateral aspect of the right 2nd rib adjacent to the glenohumeral joint. No acute abnormality of the right shoulder.   Electronically Signed   By: Geanie Cooley M.D.   On: 03/06/2013 19:22    Scheduled Meds: . aspirin  81 mg Oral Daily  . docusate sodium  100 mg Oral BID  . fenofibrate  160 mg Oral Daily  . finasteride  5 mg Oral Daily  . guaiFENesin  600 mg Oral BID  . [START ON 03/08/2013] levofloxacin  750 mg Oral Q24H  . levothyroxine  88 mcg Oral QAC breakfast  . metoprolol tartrate  25 mg Oral BID  . mirtazapine  30 mg Oral QHS  . omega-3 acid ethyl esters  1 g Oral Daily  . pantoprazole  40 mg Oral Daily  . polyethylene glycol  17 g Oral BID  . simvastatin  20 mg Oral q1800  . sodium chloride  3 mL Intravenous Q12H   Continuous Infusions:    Principal Problem:   CAP (community acquired pneumonia) Active Problems:   Atrial fibrillation   Hypertension   Hyperlipidemia   Alcoholic cirrhosis of liver with ascites   Hypothyroidism    Time spent: 25 min    Moritz Lever  [facility] Pager [phone]. If 7PM-7AM, please contact night-coverage at [email or web address], password [facility] 03/07/2013, 5:39 PM  LOS: 4  days

## 2013-03-07 NOTE — Evaluation (Signed)
Occupational Therapy Evaluation Patient Details Name: Raymond Wieneke Sr. MRN: [id number] DOB: 1923-04-08 Today's Date: 03/07/2013 Time: 2130-8657 OT Time Calculation (min): 25 min  OT Assessment / Plan / Recommendation History of present illness Pt is an 79 y.o. male lives at home with his elderly wife, having hx of HTN, hyperlipidemia, hx of afib, prior hx of alcoholism with ascites, presents to the ER via EMS with hx of fever of 101, coughs of yellow sputum, and feeling malaise.  He was seen by Dr [name], diagnosed with bronchitis, and was placed on Augmentin with steroids a few weeks ago.  He hadn't gotten better.  He has no chest pain, abdominal pain, nausea or vomiting.  He reportedly had foul smelling urine at home.  He had hx of prostatitis in the past, but hadn't complaint of any prostatism.  Evaluation in the ER included a CXR which showed interstitial edema, PNA would have similar appearance, leukocytosis with WBC of 14K, and normal renal fx tests. His UA was negative.  Hospitalist was asked to admit him for the above reasons suggestive of pneumonia.  R humerus imaging negative for fracture.   Clinical Impression   Pt demos decline in function with ADLs and ADL mobility safety and would benefit from acute OT services to address impairments to increase level of function and safety    OT Assessment  Patient needs continued OT Services    Follow Up Recommendations  SNF;Supervision/Assistance - 24 hour    Barriers to Discharge   uncertain inf family can provide current level of care  Equipment Recommendations  None recommended by OT    Recommendations for Other Services    Frequency       Precautions / Restrictions Precautions Precautions: Fall Precaution Comments: pt c/o R shoulder pain but imaging negative for fracture. Restrictions Weight Bearing Restrictions: No   Pertinent Vitals/Pain 3/10 R UE and rib are    ADL  Grooming: Wash/dry hands;Wash/dry face;Min guard Where  Assessed - Grooming: Unsupported sitting Upper Body Bathing: Simulated;Minimal assistance Lower Body Bathing: Maximal assistance Upper Body Dressing: Performed;Minimal assistance Lower Body Dressing: +1 Total assistance Toilet Transfer: Simulated;Minimal assistance Toilet Transfer Method: Sit to stand Toileting - Clothing Manipulation and Hygiene: Performed;Maximal assistance Where Assessed - Engineer, mining and Hygiene: Standing Tub/Shower Transfer Method: Not assessed Equipment Used: Gait belt;Rolling walker Transfers/Ambulation Related to ADLs: cues for safety, correct hand placement ADL Comments: pt states that he had assist at home for bathing and donning socks and shoes previously    OT Diagnosis: Generalized weakness;Acute pain  OT Problem List: Decreased strength;Decreased activity tolerance;Impaired balance (sitting and/or standing);Pain OT Treatment Interventions: Self-care/ADL training;Therapeutic exercise;Patient/family education;Neuromuscular education;Balance training;Therapeutic activities;DME and/or AE instruction   OT Goals(Current goals can be found in the care plan section) Acute Rehab OT Goals Patient Stated Goal: to get stronger so he can see his wife OT Goal Formulation: With patient Time For Goal Achievement: 03/14/13 Potential to Achieve Goals: Good ADL Goals Pt Will Perform Grooming: with supervision;with set-up;sitting Pt Will Perform Upper Body Bathing: with min guard assist;with supervision;with set-up;sitting Pt Will Perform Lower Body Bathing: with mod assist;sitting/lateral leans Pt Will Perform Upper Body Dressing: with min guard assist;with supervision;with set-up;sitting Pt Will Transfer to Toilet: with min guard assist;ambulating;regular height toilet;grab bars;bedside commode Pt Will Perform Toileting - Clothing Manipulation and hygiene: with min assist;sitting/lateral leans;sit to/from stand  Visit Information  Last OT Received On:  03/07/13 Assistance Needed: +1 History of Present Illness: Pt is an 79 y.o. male lives  at home with his elderly wife, having hx of HTN, hyperlipidemia, hx of afib, prior hx of alcoholism with ascites, presents to the ER via EMS with hx of fever of 101, coughs of yellow sputum, and feeling malaise.  He was seen by Dr [name], diagnosed with bronchitis, and was placed on Augmentin with steroids a few weeks ago.  He hadn't gotten better.  He has no chest pain, abdominal pain, nausea or vomiting.  He reportedly had foul smelling urine at home.  He had hx of prostatitis in the past, but hadn't complaint of any prostatism.  Evaluation in the ER included a CXR which showed interstitial edema, PNA would have similar appearance, leukocytosis with WBC of 14K, and normal renal fx tests. His UA was negative.  Hospitalist was asked to admit him for the above reasons suggestive of pneumonia.  R humerus imaging negative for fracture.       Prior Functioning     Home Living Family/patient expects to be discharged to:: Private residence Living Arrangements: Spouse/significant other Available Help at Discharge: Family;Personal care attendant;Available 24 hours/day Type of Home: House Home Layout: Two level;Able to live on main level with bedroom/bathroom Home Equipment: Walker - 4 wheels;Walker - 2 wheels;Cane - single point;Shower seat;Bedside commode Prior Function Level of Independence: Independent with assistive device(s) Comments: pt reports he was able to ambulate ind.with rollator and ind. in all  ADLs. However, pt also reports his wife has 24/7 caregivers that occassionally assist him prn. Communication Communication: No difficulties Dominant Hand: Right         Vision/Perception Vision - History Baseline Vision: Wears glasses all the time Patient Visual Report: No change from baseline Perception Perception: Within Functional Limits   Cognition  Cognition Arousal/Alertness:  Awake/alert Behavior During Therapy: WFL for tasks assessed/performed Overall Cognitive Status: No family/caregiver present to determine baseline cognitive functioning    Extremity/Trunk Assessment Upper Extremity Assessment Upper Extremity Assessment: Generalized weakness;RUE deficits/detail;LUE deficits/detail RUE Deficits / Details: shoulder limited due to pain from fall RUE: Unable to fully assess due to pain LUE Deficits / Details: shoulder limited approx 45 degrees flexion, pt states that his shoulder " has been this way for awhile" Cervical / Trunk Assessment Cervical / Trunk Assessment: Kyphotic     Mobility Bed Mobility Bed Mobility: Supine to Sit;Sitting - Scoot to Edge of Bed;Sit to Supine Supine to Sit: 4: Min assist Sitting - Scoot to Edge of Bed: 4: Min assist Sit to Supine: 3: Mod assist Details for Bed Mobility Assistance: assist for trunk Transfers Transfers: Sit to Stand;Stand to Sit Sit to Stand: 4: Min assist;From bed;With upper extremity assist;From chair/3-in-1 Stand to Sit: 4: Min guard;With upper extremity assist;To bed;To chair/3-in-1 Details for Transfer Assistance: assist to rise and steady and min guard to control descent. VC's for hand placement.     Exercise     Balance Balance Balance Assessed: Yes Static Sitting Balance Static Sitting - Balance Support: No upper extremity supported;Feet supported Static Sitting - Level of Assistance: 5: Stand by assistance Dynamic Sitting Balance Dynamic Sitting - Balance Support: No upper extremity supported;Feet unsupported;During functional activity Dynamic Sitting - Level of Assistance: Other (comment) (min guard A)   End of Session OT - End of Session Equipment Utilized During Treatment: Gait belt;Rolling walker Activity Tolerance: Patient tolerated treatment well;Patient limited by fatigue Patient left: in bed;with call bell/phone within reach;with bed alarm set  GO     Galen Manila 03/07/2013, 3:33 PM

## 2013-03-07 NOTE — Progress Notes (Signed)
Spoke with pt's daughter at bedside concerning Home Health, she selected [facility]. Referral given to in house rep.

## 2013-03-07 NOTE — Consult Note (Addendum)
Reason for Consult:Right 2nd rib Fx, right shoulder hemiarthroplasty Referring Physician: Thurman Coyer   MD  Raymond Cushing Sr. is an 79 y.o. male.  HPI: 79yo male admitted with URI , pneumonia and right shoulder pain. Had Hemarthroplasty by Dr. Leslee Home in 1997 and has high riding head from complete RC tear for at least 10 yrs.  Hx of fall 3 wks ago with increased shoulder pain and coughing due to URI.   CT reviewed this admission and also 2003 and multiple CXR's over the past 10 yrs that show right shoulder prosthesis.   Past Medical History  Diagnosis Date  . Arthritis   . Hyperlipidemia   . Hypertension   . A-fib     Dr Donnie Aho; WAS on Coumadin until 04/2012  . Angina     uses NTG prn  . Pneumonia     12 /2012  . Recurrent upper respiratory infection (URI)     03/2011  . Hypothyroidism     x 10 yrs  . GERD (gastroesophageal reflux disease)   . Constipation, chronic   . Depression   . History of dizziness   . Anxiety   . Urinary frequency   . Hearing loss of both ears     wears hearing aides  . BPH with obstruction/lower urinary tract symptoms     sees Dr Patsi Sears  . Compression fracture of thoracic vertebra, non-traumatic 06/14/12    T6 and T7 (noted on CT angio chest to r/o PE)  . Alcoholic cirrhosis of liver with ascites 06/14/12    Noted on CT angio chest to r/o PE  . Esophageal varices in alcoholic cirrhosis "   "    "   "    "    Past Surgical History  Procedure Laterality Date  . Total knee arthroplasty  1994    right  . Total shoulder replacement  1997    right  . Joint replacement      Rt knee and shoulder  . Hernia repair  1990    LIH repair  . Cholecystectomy  2004  . Inguinal hernia repair  05/09/2011    Procedure: HERNIA REPAIR INGUINAL ADULT;  Surgeon: Adolph Pollack, MD;  Location: [facility] OR;  Service: General;  Laterality: Right;    History reviewed. No pertinent family history.  Social History:  reports that he quit smoking about 28 years ago. His  smoking use included Cigarettes. He has a 17.5 pack-year smoking history. His smokeless tobacco use includes Chew. He reports that he does not drink alcohol or use illicit drugs.  Allergies:  Allergies  Allergen Reactions  . Amitriptyline Other (See Comments)    Dizziness   . Beta Adrenergic Blockers Other (See Comments)    lethargy  . Imdur [Isosorbide]     Headache   . Niacin And Related Rash       . Sulfa Antibiotics Itching, Nausea And Vomiting and Rash    Medications: I have reviewed the patient's current medications.  Results for orders placed during the hospital encounter of 03/03/13 (from the past 48 hour(s))  T4, FREE     Status: None   Collection Time    03/06/13  5:36 AM      Result Value Range   Free T4 1.09  0.80 - 1.80 ng/dL   Comment: Performed at [facility]    Dg Chest 2 View  03/07/2013   CLINICAL DATA:  Resolution of pneumonia.  EXAM: CHEST  2 VIEW  COMPARISON:  Chest radiograph of March 04, 2013. CT scan of June 13, 2012.  FINDINGS: Cardiomediastinal silhouette is within normal limits. The vascular congestion noted on prior exam appears to be significantly improved. No pneumothorax or pleural effusion is noted. Status post right shoulder arthroplasty. No acute pulmonary disease is noted. Compression deformities of several mid to lower thoracic vertebral bodies are noted which are unchanged compared to prior CT scan.  IMPRESSION: No active cardiopulmonary disease.   Electronically Signed   By: Roque Lias M.D.   On: 03/07/2013 08:09   Ct Shoulder Right Wo Contrast  03/06/2013   CLINICAL DATA:  Severe right shoulder pain. Previous right shoulder replacement.  EXAM: CT OF THE RIGHT SHOULDER WITHOUT CONTRAST  TECHNIQUE: Multidetector CT imaging was performed according to the standard protocol. Multiplanar CT image reconstructions were also generated.  COMPARISON:  03/04/2013  FINDINGS: There is a subtle minimally displaced fracture of the lateral  aspect of the right 2nd rib at the level of the axilla. This may be the source of the patient's pain.  There is no evidence of a fracture of the scapula or proximal humerus. The prosthesis demonstrates no evidence of loosening. There is a small effusion in the glenohumeral joint. The prosthesis is superiorly migrated and now articulates with the undersurface of the acromion. This is chronic.  IMPRESSION: Probable acute fracture of the lateral aspect of the right 2nd rib adjacent to the glenohumeral joint. No acute abnormality of the right shoulder.   Electronically Signed   By: Geanie Cooley M.D.   On: 03/06/2013 19:22    Review of Systems  Constitutional: Positive for fever.  Respiratory: Positive for cough, sputum production and shortness of breath. Negative for hemoptysis.   Cardiovascular: Positive for chest pain.       Right shoulder and infraclavicular right pain. Not angina  Gastrointestinal: Negative for heartburn.  Genitourinary: Negative for dysuria.  Musculoskeletal: Positive for joint pain.  Neurological: Negative for dizziness.  Endo/Heme/Allergies: Negative for polydipsia.  Psychiatric/Behavioral: Negative for depression.   Blood pressure 105/52, pulse 65, temperature 98.1 F (36.7 Larsen), temperature source Oral, resp. rate 18, height 5\' 9"  (1.753 m), weight 61.8 kg (136 lb 3.9 oz), SpO2 98.00%. Physical Exam  Constitutional: He is oriented to person, place, and time. He appears well-developed.  HENT:  Head: Normocephalic.  Cardiovascular: Normal rate.   Respiratory: Effort normal.  Musculoskeletal:  Right shoulder deltopectoral incision well healed. Trace hand swelling . NVI , weak shoulder with abduction. AC prominence.   Neurological: He is alert and oriented to person, place, and time.  Skin: Skin is warm and dry.  Psychiatric: He has a normal mood and affect. His behavior is normal.    Assessment/Plan: Right 2nd rib fx best seen on  Chest CT.  Right shoulder  Hemiarthroplasty with good cement mantle, has high riding head from chronic complete rotator cuff tear with undersurface acromial wear.      Pain from 2nd rib fx should resolve in a few weeks. This may be from fall 3 wks ago or from coughing from URI/pneumonia which has been treated.     May follow up PRN with me.  No loosening of the prosthesis and no indications for operative intervention.  I  discussed using his arm at lower levels and avoiding outstretched and overhead use since he has complete RC tear.   Raymond Larsen,Raymond Larsen 03/07/2013, 3:23 PM

## 2013-03-08 LAB — CBC
HCT: 31.6 % — ABNORMAL LOW (ref 39.0–52.0)
Hemoglobin: 11.1 g/dL — ABNORMAL LOW (ref 13.0–17.0)
MCHC: 35.1 g/dL (ref 30.0–36.0)
MCV: 90.5 fL (ref 78.0–100.0)
RDW: 15.1 % (ref 11.5–15.5)
WBC: 8.3 10*3/uL (ref 4.0–10.5)

## 2013-03-08 LAB — BASIC METABOLIC PANEL
BUN: 17 mg/dL (ref 6–23)
Calcium: 8.7 mg/dL (ref 8.4–10.5)
Chloride: 105 mEq/L (ref 96–112)
Creatinine, Ser: 0.7 mg/dL (ref 0.50–1.35)
GFR calc Af Amer: >90 mL/min (ref >90)
Glucose, Bld: 84 mg/dL (ref 70–99)

## 2013-03-08 NOTE — Progress Notes (Signed)
[facility] PROGRESS NOTE  Raymond Floyd Sr. UJW:[id number] DOB: December 06, 1922 DOA: 03/03/2013 PCP: Jeoffrey Massed, MD Brief  HPI: Raymond Niu Sr. is an 79 y.o. male lives at home with his elderly wife, having hx of HTN, hyperlipidemia, hx of afib, prior hx of alcoholism with ascites, presents to the ER via EMS with hx of fever of 101, coughs of yellow sputum, and feeling malaise. He was seen by Dr [name], diagnosed with bronchitis, and was placed on Augmentin with steroids a few weeks ago. He hadn't gotten better. He has no chest pain, abdominal pain, nausea or vomiting. He reportedly had foul smelling urine at home. He had hx of prostatitis in the past, but hadn't complaint of any prostatism. Evaluation in the ER included a CXR which showed interstitial edema, PNA would have similar appearance, leukocytosis with WBC of 14K, and normal renal fx tests. His UA was negative. Hospitalist was asked to admit him for the above reasons suggestive of pneumonia.  Assessment/Plan: 1. CAP: - admitted to telemetry and started him on IV levaquin.  - repeat CXR in am shows resolution of pneumonia.  - blood cultures negative .  -  STOP antibiotics after today.  2. ATrial fibrillation: - rate controlled - resume metoprolol at home dose.   3. Hypertension:  - controlled.   4. Hypothyroidism: - continue with synthroid.   5. Dysphagia: on dysphagia 2 diet. Thin liquids.   6. Right shoulder pain: CT of the shoulder shows 2nd rib fractures. Orthopedics consulted , no surgical intervention. Pain control PT eval recommended SNF.   DVT prophylaxis.   Code Status: full code Family Communication: none at bedside, spoke to daughter over the phone.  Disposition Plan: snf.    Consultants: none Procedures:  none  Antibiotics:  LEVAQUIN 11/11  HPI/Subjective: Comfortable. Reports right shoulder pain.   Objective: Filed Vitals:   03/08/13 0533  BP: 110/82  Pulse: 76  Temp: 98.2 F (36.8 C)   Resp: 18    Intake/Output Summary (Last 24 hours) at 03/08/13 1424 Last data filed at 03/08/13 0534  Gross per 24 hour  Intake    360 ml  Output   1575 ml  Net  -1215 ml   Filed Weights   03/04/13 0450  Weight: 61.8 kg (136 lb 3.9 oz)    Exam:   General:  Alert afebrile comfortable  Cardiovascular: s1s2  Respiratory: ctab  Abdomen: soft NT ND BS+  Musculoskeletal: no pedal edema.   Data Reviewed: Basic Metabolic Panel:  Recent Labs Lab 03/04/13 0040 03/04/13 0059 03/04/13 0645 03/05/13 0457 03/08/13 0510  NA 135 138  --  133* 133*  K 4.0 4.6  --  4.0 3.6  CL 106 107  --  107 105  CO2 21  --   --  20 22  GLUCOSE 94 85  --  80 84  BUN 19 26*  --  17 17  CREATININE 0.75 1.00 0.75 0.81 0.70  CALCIUM 9.4  --   --  9.0 8.7   Liver Function Tests:  Recent Labs Lab 03/04/13 0040  AST 41*  ALT 15  ALKPHOS 179*  BILITOT 1.8*  PROT 5.7*  ALBUMIN 2.8*   No results found for this basename: LIPASE, AMYLASE,  in the last 168 hours No results found for this basename: AMMONIA,  in the last 168 hours CBC:  Recent Labs Lab 03/04/13 0040 03/04/13 0059 03/04/13 0645 03/05/13 0457 03/08/13 0510  WBC 14.4*  --  11.7* 9.7 8.3  NEUTROABS  --   --   --  6.0  --   HGB 11.8* 12.2* 10.5* 9.9* 11.1*  HCT 33.7* 36.0* 30.3* 28.3* 31.6*  MCV 91.8  --  91.8 91.6 90.5  PLT 86*  --  75* 88* 170   Cardiac Enzymes: No results found for this basename: CKTOTAL, CKMB, CKMBINDEX, TROPONINI,  in the last 168 hours BNP (last 3 results) No results found for this basename: PROBNP,  in the last 8760 hours CBG: No results found for this basename: GLUCAP,  in the last 168 hours  Recent Results (from the past 240 hour(s))  URINE CULTURE     Status: None   Collection Time    03/04/13  1:00 AM      Result Value Range Status   Specimen Description URINE, CATHETERIZED   Final   Special Requests NONE   Final   Culture  Setup Time     Final   Value: 03/04/2013 12:25      Performed at [facility]   Culture     Final   Value: NO GROWTH     Performed at [facility]   Report Status 03/05/2013 FINAL   Final  CULTURE, BLOOD (ROUTINE X 2)     Status: None   Collection Time    03/04/13  6:45 AM      Result Value Range Status   Specimen Description BLOOD LEFT ARM   Final   Special Requests BOTTLES DRAWN AEROBIC AND ANAEROBIC 5CC   Final   Culture  Setup Time     Final   Value: 03/04/2013 08:38     Performed at [facility]   Culture     Final   Value:        BLOOD CULTURE RECEIVED NO GROWTH TO DATE CULTURE WILL BE HELD FOR 5 DAYS BEFORE ISSUING A FINAL NEGATIVE REPORT     Performed at [facility]   Report Status PENDING   Incomplete  CULTURE, BLOOD (ROUTINE X 2)     Status: None   Collection Time    03/04/13  6:50 AM      Result Value Range Status   Specimen Description BLOOD LEFT HAND   Final   Special Requests BOTTLES DRAWN AEROBIC AND ANAEROBIC 5CC   Final   Culture  Setup Time     Final   Value: 03/04/2013 08:38     Performed at [facility]   Culture     Final   Value:        BLOOD CULTURE RECEIVED NO GROWTH TO DATE CULTURE WILL BE HELD FOR 5 DAYS BEFORE ISSUING A FINAL NEGATIVE REPORT     Performed at [facility]   Report Status PENDING   Incomplete     Studies: Dg Chest 2 View  03/07/2013   CLINICAL DATA:  Resolution of pneumonia.  EXAM: CHEST  2 VIEW  COMPARISON:  Chest radiograph of March 04, 2013. CT scan of June 13, 2012.  FINDINGS: Cardiomediastinal silhouette is within normal limits. The vascular congestion noted on prior exam appears to be significantly improved. No pneumothorax or pleural effusion is noted. Status post right shoulder arthroplasty. No acute pulmonary disease is noted. Compression deformities of several mid to lower thoracic vertebral bodies are noted which are unchanged compared to prior CT scan.  IMPRESSION: No active cardiopulmonary disease.   Electronically Signed    By: Roque Lias M.D.   On: 03/07/2013 08:09  Ct Shoulder Right Wo Contrast  03/06/2013   CLINICAL DATA:  Severe right shoulder pain. Previous right shoulder replacement.  EXAM: CT OF THE RIGHT SHOULDER WITHOUT CONTRAST  TECHNIQUE: Multidetector CT imaging was performed according to the standard protocol. Multiplanar CT image reconstructions were also generated.  COMPARISON:  03/04/2013  FINDINGS: There is a subtle minimally displaced fracture of the lateral aspect of the right 2nd rib at the level of the axilla. This may be the source of the patient's pain.  There is no evidence of a fracture of the scapula or proximal humerus. The prosthesis demonstrates no evidence of loosening. There is a small effusion in the glenohumeral joint. The prosthesis is superiorly migrated and now articulates with the undersurface of the acromion. This is chronic.  IMPRESSION: Probable acute fracture of the lateral aspect of the right 2nd rib adjacent to the glenohumeral joint. No acute abnormality of the right shoulder.   Electronically Signed   By: Geanie Cooley M.D.   On: 03/06/2013 19:22    Scheduled Meds: . aspirin  81 mg Oral Daily  . docusate sodium  100 mg Oral BID  . fenofibrate  160 mg Oral Daily  . finasteride  5 mg Oral Daily  . guaiFENesin  600 mg Oral BID  . levofloxacin  750 mg Oral Q24H  . levothyroxine  88 mcg Oral QAC breakfast  . metoprolol tartrate  25 mg Oral BID  . mirtazapine  30 mg Oral QHS  . omega-3 acid ethyl esters  1 g Oral Daily  . pantoprazole  40 mg Oral Daily  . polyethylene glycol  17 g Oral BID  . simvastatin  20 mg Oral q1800  . sodium chloride  3 mL Intravenous Q12H   Continuous Infusions:    Principal Problem:   CAP (community acquired pneumonia) Active Problems:   Atrial fibrillation   Hypertension   Hyperlipidemia   Alcoholic cirrhosis of liver with ascites   Hypothyroidism    Time spent: 25 min    Ayven Pheasant  [facility] Pager [phone]. If  7PM-7AM, please contact night-coverage at [email or web address], password [facility] 03/08/2013, 2:24 PM  LOS: 5 days

## 2013-03-08 NOTE — Progress Notes (Addendum)
Patient ID: Raymond Lizer Sr., male   DOB: 1923-03-07, 79 y.o.   MRN: [id number] Subjective:    Awake, alert and oriented x3. Right arm is weak, likely rotator cuff deficient shoulder h/o shoulder replacement. 1st rib fracture.  Patient reports pain as mild.    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 Pneumonia.  Up with therapy Weight bear as tolerated right arm.  NITKA,Ferrel E 03/08/2013, 11:43 AM

## 2013-03-08 NOTE — Progress Notes (Signed)
Per MD, Pt's family now interested in [facility] and [address].  Explained to MD that Pt's ins requires pre-auth and that this process can't be started until Monday.  Confirmed with [place] at [facility] that they accept [facility].  Notified MD.   SNF bed request sent to [facility].  Weekday CSW to follow.  Providence Crosby, LCSWA Clinical Social Work [phone]

## 2013-03-09 NOTE — Progress Notes (Signed)
[facility] PROGRESS NOTE  Raymond Mier Sr. ZOX:[id number] DOB: 1922/07/18 DOA: 03/03/2013 PCP: Jeoffrey Massed, MD Brief  HPI: Raymond Lundberg Sr. is an 79 y.o. male lives at home with his elderly wife, having hx of HTN, hyperlipidemia, hx of afib, prior hx of alcoholism with ascites, presents to the ER via EMS with hx of fever of 101, coughs of yellow sputum, and feeling malaise. He was seen by Dr [name], diagnosed with bronchitis, and was placed on Augmentin with steroids a few weeks ago. He hadn't gotten better. He has no chest pain, abdominal pain, nausea or vomiting. He reportedly had foul smelling urine at home. He had hx of prostatitis in the past, but hadn't complaint of any prostatism. Evaluation in the ER included a CXR which showed interstitial edema, PNA would have similar appearance, leukocytosis with WBC of 14K, and normal renal fx tests. His UA was negative. Hospitalist was asked to admit him for the above reasons suggestive of pneumonia.  Assessment/Plan: 1. CAP: - admitted to telemetry and started him on IV levaquin.  - repeat CXR in am shows resolution of pneumonia.  - blood cultures negative .  -  STOP antibiotics on 11/15 2. ATrial fibrillation: - rate controlled - resume metoprolol at home dose.   3. Hypertension:  - controlled.   4. Hypothyroidism: - continue with synthroid.   5. Dysphagia: on dysphagia 2 diet. Thin liquids.   6. Right shoulder pain: CT of the shoulder shows 2nd rib fractures. Orthopedics consulted , no surgical intervention. Pain control PT eval recommended SNF.   DVT prophylaxis.   Code Status: full code Family Communication: none at bedside, spoke to daughter over the phone.  Disposition Plan: snf in am.    Consultants: none Procedures:  none  Antibiotics:  LEVAQUIN 11/11  HPI/Subjective: Comfortable. Reports right shoulder pain.   Objective: Filed Vitals:   03/09/13 1419  BP: 119/65  Pulse: 63  Temp: 98.1 F (36.7 C)   Resp: 18    Intake/Output Summary (Last 24 hours) at 03/09/13 1822 Last data filed at 03/09/13 1431  Gross per 24 hour  Intake    480 ml  Output   1000 ml  Net   -520 ml   Filed Weights   03/04/13 0450  Weight: 61.8 kg (136 lb 3.9 oz)    Exam:   General:  Alert afebrile comfortable  Cardiovascular: s1s2  Respiratory: ctab  Abdomen: soft NT ND BS+  Musculoskeletal: no pedal edema.   Data Reviewed: Basic Metabolic Panel:  Recent Labs Lab 03/04/13 0040 03/04/13 0059 03/04/13 0645 03/05/13 0457 03/08/13 0510  NA 135 138  --  133* 133*  K 4.0 4.6  --  4.0 3.6  CL 106 107  --  107 105  CO2 21  --   --  20 22  GLUCOSE 94 85  --  80 84  BUN 19 26*  --  17 17  CREATININE 0.75 1.00 0.75 0.81 0.70  CALCIUM 9.4  --   --  9.0 8.7   Liver Function Tests:  Recent Labs Lab 03/04/13 0040  AST 41*  ALT 15  ALKPHOS 179*  BILITOT 1.8*  PROT 5.7*  ALBUMIN 2.8*   No results found for this basename: LIPASE, AMYLASE,  in the last 168 hours No results found for this basename: AMMONIA,  in the last 168 hours CBC:  Recent Labs Lab 03/04/13 0040 03/04/13 0059 03/04/13 0645 03/05/13 0457 03/08/13 0510  WBC 14.4*  --  11.7* 9.7  8.3  NEUTROABS  --   --   --  6.0  --   HGB 11.8* 12.2* 10.5* 9.9* 11.1*  HCT 33.7* 36.0* 30.3* 28.3* 31.6*  MCV 91.8  --  91.8 91.6 90.5  PLT 86*  --  75* 88* 170   Cardiac Enzymes: No results found for this basename: CKTOTAL, CKMB, CKMBINDEX, TROPONINI,  in the last 168 hours BNP (last 3 results) No results found for this basename: PROBNP,  in the last 8760 hours CBG: No results found for this basename: GLUCAP,  in the last 168 hours  Recent Results (from the past 240 hour(s))  URINE CULTURE     Status: None   Collection Time    03/04/13  1:00 AM      Result Value Range Status   Specimen Description URINE, CATHETERIZED   Final   Special Requests NONE   Final   Culture  Setup Time     Final   Value: 03/04/2013 12:25      Performed at [facility]   Culture     Final   Value: NO GROWTH     Performed at [facility]   Report Status 03/05/2013 FINAL   Final  CULTURE, BLOOD (ROUTINE X 2)     Status: None   Collection Time    03/04/13  6:45 AM      Result Value Range Status   Specimen Description BLOOD LEFT ARM   Final   Special Requests BOTTLES DRAWN AEROBIC AND ANAEROBIC 5CC   Final   Culture  Setup Time     Final   Value: 03/04/2013 08:38     Performed at [facility]   Culture     Final   Value:        BLOOD CULTURE RECEIVED NO GROWTH TO DATE CULTURE WILL BE HELD FOR 5 DAYS BEFORE ISSUING A FINAL NEGATIVE REPORT     Performed at [facility]   Report Status PENDING   Incomplete  CULTURE, BLOOD (ROUTINE X 2)     Status: None   Collection Time    03/04/13  6:50 AM      Result Value Range Status   Specimen Description BLOOD LEFT HAND   Final   Special Requests BOTTLES DRAWN AEROBIC AND ANAEROBIC 5CC   Final   Culture  Setup Time     Final   Value: 03/04/2013 08:38     Performed at [facility]   Culture     Final   Value:        BLOOD CULTURE RECEIVED NO GROWTH TO DATE CULTURE WILL BE HELD FOR 5 DAYS BEFORE ISSUING A FINAL NEGATIVE REPORT     Performed at [facility]   Report Status PENDING   Incomplete     Studies: No results found.  Scheduled Meds: . aspirin  81 mg Oral Daily  . docusate sodium  100 mg Oral BID  . fenofibrate  160 mg Oral Daily  . finasteride  5 mg Oral Daily  . guaiFENesin  600 mg Oral BID  . levofloxacin  750 mg Oral Q24H  . levothyroxine  88 mcg Oral QAC breakfast  . metoprolol tartrate  25 mg Oral BID  . mirtazapine  30 mg Oral QHS  . omega-3 acid ethyl esters  1 g Oral Daily  . pantoprazole  40 mg Oral Daily  . polyethylene glycol  17 g Oral BID  . simvastatin  20 mg Oral  q1800   Continuous Infusions:    Principal Problem:   CAP (community acquired pneumonia) Active Problems:   Atrial fibrillation    Hypertension   Hyperlipidemia   Alcoholic cirrhosis of liver with ascites   Hypothyroidism    Time spent: 25 min    Cody Albus  [facility] Pager [phone]. If 7PM-7AM, please contact night-coverage at [email or web address], password [facility] 03/09/2013, 6:22 PM  LOS: 6 days

## 2013-03-10 LAB — CULTURE, BLOOD (ROUTINE X 2)
Culture: NO GROWTH
Culture: NO GROWTH

## 2013-03-10 MED ORDER — GUAIFENESIN ER 600 MG PO TB12: 600.0000 mg | ORAL_TABLET | Freq: Two times a day (BID) | ORAL | Status: DC

## 2013-03-10 MED ORDER — HYDROCODONE-ACETAMINOPHEN 5-325 MG PO TABS: 1.0000 | ORAL_TABLET | Freq: Four times a day (QID) | ORAL | Status: DC | PRN

## 2013-03-10 MED ORDER — LORAZEPAM 0.5 MG PO TABS: 0.5000 mg | ORAL_TABLET | Freq: Every day | ORAL | Status: DC

## 2013-03-10 MED ORDER — RESOURCE THICKENUP CLEAR PO POWD: ORAL | Status: DC

## 2013-03-10 NOTE — Progress Notes (Signed)
Report called to [facility] at [facility]. Setzer, Don Broach

## 2013-03-10 NOTE — Discharge Summary (Signed)
Physician Discharge Summary  Raymond Mcnelly Sr. MVH:[id number] DOB: Jun 13, 1922 DOA: 03/03/2013  PCP: Jeoffrey Massed, MD  Admit date: 03/03/2013 Discharge date: 03/10/2013  Time spent: 30 minutes  Recommendations for Outpatient Follow-up:  1. Follow upwith PCP in one week.   Discharge Diagnoses:  Principal Problem:   CAP (community acquired pneumonia) Active Problems:   Atrial fibrillation   Hypertension   Hyperlipidemia   Alcoholic cirrhosis of liver with ascites   Hypothyroidism   Discharge Condition: improved  Diet recommendation: low sodium diet  Filed Weights   03/04/13 0450  Weight: 61.8 kg (136 lb 3.9 oz)    History of present illness:  Raymond Christofferson Sr. is an 79 y.o. male lives at home with his elderly wife, having hx of HTN, hyperlipidemia, hx of afib, prior hx of alcoholism with ascites, presents to the ER via EMS with hx of fever of 101, coughs of yellow sputum, and feeling malaise. He was seen by Dr [name], diagnosed with bronchitis, and was placed on Augmentin with steroids a few weeks ago. He hadn't gotten better. He has no chest pain, abdominal pain, nausea or vomiting. He reportedly had foul smelling urine at home. He had hx of prostatitis in the past, but hadn't complaint of any prostatism. Evaluation in the ER included a CXR which showed interstitial edema, PNA would have similar appearance, leukocytosis with WBC of 14K, and normal renal fx tests. His UA was negative. Hospitalist was asked to admit him for the above reasons suggestive of pneumonia.   Hospital Course:  1. CAP: He was initially admitted to telemetry and started him on IV levaquin.  repeat CXR in am shows resolution of pneumonia.  - blood cultures negative . He completed 7 days of antibiotics.  2. ATrial fibrillation:  - rate controlled  - resume metoprolol at home dose.  3. Hypertension:  - controlled.  4. Hypothyroidism:  - continue with synthroid.  5. Dysphagia: on dysphagia 2 diet. Thin  liquids.  6. Right shoulder pain: CT of the shoulder shows 2nd rib fractures. Orthopedics consulted , no surgical intervention. Pain control PT eval recommended SNF.    Procedures:  CT of the right shoulder  Consultations:  orthopedics  Discharge Exam: Filed Vitals:   03/10/13 1103  BP: 109/61  Pulse: 100  Temp:   Resp:     General: Alert afebrile comfortable Cardiovascular: s1s2 Respiratory: ctab Abdomen: soft NT ND BS+ Musculoskeletal: no pedal edema. Right shoulder pain.    Discharge Instructions      Discharge Orders   Future Orders Complete By Expires   Diet - low sodium heart healthy  As directed    Discharge instructions  As directed    Comments:     FOLLOW up with PCP in one week       Medication List         aspirin 81 MG tablet  Take 81 mg by mouth daily.     fenofibrate 160 MG tablet  Take 160 mg by mouth daily.     finasteride 5 MG tablet  Commonly known as:  PROSCAR     fish oil-omega-3 fatty acids 1000 MG capsule  Take 1 g by mouth 2 (two) times daily.     furosemide 40 MG tablet  Commonly known as:  LASIX  Take 40 mg by mouth every other day.     guaiFENesin 600 MG 12 hr tablet  Commonly known as:  MUCINEX  Take 1 tablet (600 mg total) by mouth 2 (  two) times daily.     HYDROcodone-acetaminophen 5-325 MG per tablet  Commonly known as:  NORCO/VICODIN  Take 1 tablet by mouth every 6 (six) hours as needed.     levothyroxine 100 MCG tablet  Commonly known as:  SYNTHROID, LEVOTHROID  Take 100 mcg by mouth daily.     LORazepam 0.5 MG tablet  Commonly known as:  ATIVAN  Take 1-2 tablets (0.5-1 mg total) by mouth at bedtime. As needed for sleep     meclizine 25 MG tablet  Commonly known as:  ANTIVERT  Take 1 tablet (25 mg total) by mouth 2 (two) times daily as needed. For dizziness     metoprolol tartrate 25 MG tablet  Commonly known as:  LOPRESSOR  Take 1 tablet (25 mg total) by mouth 2 (two) times daily.     mirtazapine 30 MG  tablet  Commonly known as:  REMERON  Take 30 mg by mouth At bedtime.     nitroGLYCERIN 0.4 MG SL tablet  Commonly known as:  NITROSTAT  Place 0.4 mg under the tongue every 5 (five) minutes as needed. For chest pain     pantoprazole 40 MG tablet  Commonly known as:  PROTONIX  Take 40 mg by mouth daily.     polyethylene glycol packet  Commonly known as:  MIRALAX / GLYCOLAX  Take 17 g by mouth 2 (two) times daily.     pravastatin 40 MG tablet  Commonly known as:  PRAVACHOL  Take 2 tablets (80 mg total) by mouth daily.     RESOURCE THICKENUP CLEAR Powd  AS NEEDED.     spironolactone 25 MG tablet  Commonly known as:  ALDACTONE  Take 25 mg by mouth every other day.       Allergies  Allergen Reactions  . Amitriptyline Other (See Comments)    Dizziness   . Beta Adrenergic Blockers Other (See Comments)    lethargy  . Imdur [Isosorbide]     Headache   . Niacin And Related Rash       . Sulfa Antibiotics Itching, Nausea And Vomiting and Rash   Follow-up Information   Follow up with Alvy Beal, MD. (needs return office visit one week)    Specialty:  Orthopedic Surgery   Contact information:   [address] [place] [address] [phone]       Follow up with Eldred Manges, MD In 1 month. (As needed)    Specialty:  Orthopedic Surgery   Contact information:   [address] [place] [address] [place] [address] [phone]        The results of significant diagnostics from this hospitalization (including imaging, microbiology, ancillary and laboratory) are listed below for reference.    Significant Diagnostic Studies: Dg Chest 2 View  03/07/2013   CLINICAL DATA:  Resolution of pneumonia.  EXAM: CHEST  2 VIEW  COMPARISON:  Chest radiograph of March 04, 2013. CT scan of June 13, 2012.  FINDINGS: Cardiomediastinal silhouette is within normal limits. The vascular congestion noted on prior exam appears to be significantly improved. No pneumothorax  or pleural effusion is noted. Status post right shoulder arthroplasty. No acute pulmonary disease is noted. Compression deformities of several mid to lower thoracic vertebral bodies are noted which are unchanged compared to prior CT scan.  IMPRESSION: No active cardiopulmonary disease.   Electronically Signed   By: Roque Lias M.D.   On: 03/07/2013 08:09   Ct Shoulder Right Wo Contrast  03/06/2013   CLINICAL  DATA:  Severe right shoulder pain. Previous right shoulder replacement.  EXAM: CT OF THE RIGHT SHOULDER WITHOUT CONTRAST  TECHNIQUE: Multidetector CT imaging was performed according to the standard protocol. Multiplanar CT image reconstructions were also generated.  COMPARISON:  03/04/2013  FINDINGS: There is a subtle minimally displaced fracture of the lateral aspect of the right 2nd rib at the level of the axilla. This may be the source of the patient's pain.  There is no evidence of a fracture of the scapula or proximal humerus. The prosthesis demonstrates no evidence of loosening. There is a small effusion in the glenohumeral joint. The prosthesis is superiorly migrated and now articulates with the undersurface of the acromion. This is chronic.  IMPRESSION: Probable acute fracture of the lateral aspect of the right 2nd rib adjacent to the glenohumeral joint. No acute abnormality of the right shoulder.   Electronically Signed   By: Geanie Cooley M.D.   On: 03/06/2013 19:22   Dg Chest Portable 1 View  03/04/2013   CLINICAL DATA:  Fever, cough and weakness.  EXAM: PORTABLE CHEST - 1 VIEW  COMPARISON:  Chest radiograph performed 06/11/2012, and CTA of the chest performed 06/13/2012  FINDINGS: The lungs are well-aerated. Vascular congestion is noted, with mildly increased interstitial markings, possibly reflecting mild interstitial edema. Pneumonia might have a similar appearance. No pleural effusion or pneumothorax is seen.  The cardiomediastinal silhouette is within normal limits. No acute osseous  abnormalities are seen. A right humeral head prosthesis is noted in unchanged position.  IMPRESSION: Vascular congestion, with mildly increased interstitial markings, possibly reflecting mild interstitial edema. Pneumonia might have a similar appearance.   Electronically Signed   By: Roanna Raider M.D.   On: 03/04/2013 01:05   Dg Humerus Right  03/04/2013   CLINICAL DATA:  Right shoulder pain.  EXAM: RIGHT HUMERUS - 2+ VIEW  COMPARISON:  None.  FINDINGS: The patient's right humeral prosthesis is unusually superiorly positioned; however, this appears stable from multiple prior studies, and likely reflects the patient's baseline. There is no evidence of acute fracture. No significant prosthesis loosening is seen. Mild degenerative change is noted at the right acromioclavicular joint, with mild overlying soft tissue swelling.  The visualized portions of the right lung appear clear.  IMPRESSION: No definite evidence of fracture or loosening. Superiorly positioned appearance of the right humeral prosthesis is stable from multiple prior studies and likely reflects the patient's baseline.   Electronically Signed   By: Roanna Raider M.D.   On: 03/04/2013 02:10   Dg Swallowing Func-speech Pathology  03/05/2013   Chales Abrahams, CCC-SLP     03/05/2013  2:08 PM Objective Swallowing Evaluation: Modified Barium Swallowing Study   Patient Details  Name: Raymond Agustin Sr. MRN: [id number] Date of Birth: 04-08-1923  Today's Date: 03/05/2013 Time: 1135-1203 SLP Time Calculation (min): 28 min  Past Medical History:  Past Medical History  Diagnosis Date  . Arthritis   . Hyperlipidemia   . Hypertension   . A-fib     Dr Donnie Aho; WAS on Coumadin until 04/2012  . Angina     uses NTG prn  . Pneumonia     12 /2012  . Recurrent upper respiratory infection (URI)     03/2011  . Hypothyroidism     x 10 yrs  . GERD (gastroesophageal reflux disease)   . Constipation, chronic   . Depression   . History of dizziness   . Anxiety   . Urinary  frequency   .  Hearing loss of both ears     wears hearing aides  . BPH with obstruction/lower urinary tract symptoms     sees Dr Patsi Sears  . Compression fracture of thoracic vertebra, non-traumatic  06/14/12    T6 and T7 (noted on CT angio chest to r/o PE)  . Alcoholic cirrhosis of liver with ascites 06/14/12    Noted on CT angio chest to r/o PE  . Esophageal varices in alcoholic cirrhosis "   "    "   "    "   Past Surgical History:  Past Surgical History  Procedure Laterality Date  . Total knee arthroplasty  1994    right  . Total shoulder replacement  1997    right  . Joint replacement      Rt knee and shoulder  . Hernia repair  1990    LIH repair  . Cholecystectomy  2004  . Inguinal hernia repair  05/09/2011    Procedure: HERNIA REPAIR INGUINAL ADULT;  Surgeon: Adolph Pollack, MD;  Location: [facility] OR;  Service: General;  Laterality:  Right;   HPI:  Raymond Musick Sr. is an 79 y.o. male lives at home with his elderly  wife, having hx of HTN, hyperlipidemia, hx of afib, prior hx of  alcoholism with ascites, presents to the ER via EMS with hx of  fever of 101, coughs of yellow sputum, and feeling malaise.  He  was seen by Dr [name], diagnosed with bronchitis, and was placed  on Augmentin with steroids a few weeks ago.  He hadn't gotten  better.  He has no chest pain, abdominal pain, nausea or  vomiting.  He reportedly had foul smelling urine at home.  He had  hx of prostatitis in the past, but hadn't complaint of any  prostatism.  Evaluation in the ER included a CXR which showed  interstitial edema, PNA would have similar appearance,  leukocytosis with WBC of 14K, and normal renal fx tests. His UA  was negative.  BSE completed revealing concern for multifactorial  dysphagia and aspiraiton.  MBS recommended to allow determination  for best diet and strategies to mitigate aspiration.       Assessment / Plan / Recommendation Clinical Impression  Dysphagia Diagnosis: Moderate oral phase dysphagia;Mild  pharyngeal phase  dysphagia;Suspected primary esophageal  dysphagia;Moderate cervical esophageal phase dysphagia  Clinical impression: Pt presents with moderate oral, mild  pharyngeal and moderate cervical esophageal phase dysphagia.   Pt's dysphagia characterized by oral weakness resulting in  delayed oral transiting and lingual pumping.    Pharyngeal stasis  present primarily due to decreased UES opening and known Zenker's  diverticulum  (seen on barium swallow in 2011).  Laryngeal  penetration of thin liquid noted x3- but adequately cleared with  CUED throat clear and dry swallow.    Please note pt DID NOT sense pharyngeal or esophageal stasis  during testing, which will increase his risk.  Pt was able to  conduct dry swallows and propel barium stasis from pharynx into  oral cavity and expectorate to maximize airway protection.   Pt produces independent breathhold with double swallow while  consuming liquids, SLP suspects this is a compensation strategy  created by pt due to chronicity of dysphagia.  Suspect dysphagia  may be worsened now due to current medical issue/weakness and  follow up SLP, ? HH may be helpful to strengthen pt's oral  swallow and aid generalization of compensation strategies.      Rec  to return to Dys2 diet due to weakness and advance liquids to  thin with strict precautions to mitigate aspiration.   Increased  viscocity of liquids may further impair esophageal clearance and  aspiration was prevented with strict compensation strategies.       Note, pt did cough during MBS but no barium was visualized in  larynx/trachea at that time.      Treatment Recommendation  Therapy as outlined in treatment plan below    Diet Recommendation Dysphagia 2 (Fine chop);Thin liquid   Liquid Administration via: Cup Medication Administration: Crushed with puree Supervision: Patient able to self feed;Full supervision/cueing  for compensatory strategies Compensations: Slow rate;Small sips/bites;Multiple dry swallows  after each  bite/sip;Clear throat after each swallow (hock to  clear pharyngeal stasis at end of meal) Postural Changes and/or Swallow Maneuvers: Seated upright 90  degrees;Upright 30-60 min after meal    Other  Recommendations Oral Care Recommendations: Oral care BID   Follow Up Recommendations  24 hour supervision/assistance;Home health SLP    Frequency and Duration min 2x/week  2 weeks   Pertinent Vitals/Pain Afebrile, decreased    SLP Swallow Goals  see care plan   General Date of Onset: 03/05/13 HPI: Raymond Dimaio Sr. is an 79 y.o. male lives at home with his  elderly wife, having hx of HTN, hyperlipidemia, hx of afib, prior  hx of alcoholism with ascites, presents to the ER via EMS with hx  of fever of 101, coughs of yellow sputum, and feeling malaise.   He was seen by Dr [name], diagnosed with bronchitis, and was  placed on Augmentin with steroids a few weeks ago.  He hadn't  gotten better.  He has no chest pain, abdominal pain, nausea or  vomiting.  He reportedly had foul smelling urine at home.  He had  hx of prostatitis in the past, but hadn't complaint of any  prostatism.  Evaluation in the ER included a CXR which showed  interstitial edema, PNA would have similar appearance,  leukocytosis with WBC of 14K, and normal renal fx tests. His UA  was negative.  BSE completed revealing concern for multifactorial  dysphagia and aspiraiton.  MBS recommended to allow determination  for best diet and strategies to mitigate aspiration.   Type of Study: Modified Barium Swallowing Study Reason for Referral: Objectively evaluate swallowing function Previous Swallow Assessment: none Diet Prior to this Study: Dysphagia 2 (chopped);Honey-thick  liquids Respiratory Status: Room air History of Recent Intubation: No Behavior/Cognition: Alert;Cooperative;Pleasant mood;Requires  cueing;Decreased sustained attention;Hard of hearing Oral Cavity - Dentition: Dentures, top;Missing dentition (no  lower dentures) Oral Motor / Sensory Function:  Impaired - see Bedside swallow  eval Self-Feeding Abilities: Able to feed self;Needs set up (right  shoulder issues) Patient Positioning: Upright in bed Baseline Vocal Quality: Wet;Low vocal intensity Volitional Cough: Weak Volitional Swallow: Able to elicit Anatomy: Within functional limits Pharyngeal Secretions: Not observed secondary MBS    Reason for Referral Objectively evaluate swallowing function   Oral Phase Oral Preparation/Oral Phase Oral Phase: Impaired Oral - Nectar Oral - Nectar Teaspoon: Lingual pumping;Delayed oral  transit;Reduced posterior propulsion;Weak lingual manipulation Oral - Nectar Cup: Weak lingual manipulation;Delayed oral  transit;Lingual pumping;Reduced posterior propulsion Oral - Thin Oral - Thin Teaspoon: Reduced posterior propulsion;Lingual  pumping;Weak lingual manipulation;Delayed oral transit Oral - Thin Cup: Lingual/palatal residue;Reduced posterior  propulsion;Lingual pumping;Weak lingual manipulation Oral - Thin Straw: Reduced posterior propulsion;Weak lingual  manipulation;Delayed oral transit Oral - Solids Oral - Puree: Reduced posterior propulsion;Delayed oral  transit;Lingual  pumping;Weak lingual manipulation Oral - Regular: Impaired mastication;Reduced posterior  propulsion;Weak lingual manipulation;Lingual pumping;Delayed oral  transit   Pharyngeal Phase Pharyngeal Phase Pharyngeal Phase: Impaired Pharyngeal - Thin Pharyngeal - Thin Teaspoon: Pharyngeal residue - pyriform  sinuses;Pharyngeal residue - valleculae Pharyngeal - Thin Cup: Delayed swallow initiation;Pharyngeal  residue - valleculae;Pharyngeal residue - pyriform sinuses Pharyngeal - Thin Straw: Delayed swallow initiation;Premature  spillage to pyriform sinuses;Premature spillage to  valleculae;Pharyngeal residue - pyriform sinuses;Pharyngeal  residue - valleculae Pharyngeal - Solids Pharyngeal - Puree: Pharyngeal residue - valleculae;Pharyngeal  residue - pyriform sinuses Pharyngeal - Regular: Pharyngeal residue  - valleculae;Pharyngeal  residue - pyriform sinuses Pharyngeal Phase - Comment Pharyngeal Comment: head turn right (to weak side) nor chin tuck  helpful to prevent stasis, CUED dry swallows effective to  decrease stasis, pt able to "hock" to aid pharyngeal clearance  that pt did not sense  Cervical Esophageal Phase    GO    Cervical Esophageal Phase Cervical Esophageal Phase: Impaired Cervical Esophageal Phase - Nectar Nectar Teaspoon: Reduced cricopharyngeal relaxation Nectar Cup: Reduced cricopharyngeal relaxation;Esophageal  backflow into the pharynx Cervical Esophageal Phase - Thin Thin Teaspoon: Reduced cricopharyngeal relaxation Thin Cup: Reduced cricopharyngeal relaxation;Esophageal backflow  into the pharynx Thin Straw: Reduced cricopharyngeal relaxation Cervical Esophageal Phase - Solids Puree: Reduced cricopharyngeal relaxation Regular: Reduced cricopharyngeal relaxation Cervical Esophageal Phase - Comment Cervical Esophageal Comment: pt belching during MBS with backflow  x2 observed, appearance of Zenker's diverticulum noted with  delayed clearance distally with ? findings consistent with spasm  WITHOUT pt sensation- CUED dry swallow aided esophageal  clearance- radiologist not present to confirm findings,   esophagram completed in 2011 in epic for MD to review if desire         Donavan Burnet, MS [facility] SLP [phone]     Microbiology: Recent Results (from the past 240 hour(s))  URINE CULTURE     Status: None   Collection Time    03/04/13  1:00 AM      Result Value Range Status   Specimen Description URINE, CATHETERIZED   Final   Special Requests NONE   Final   Culture  Setup Time     Final   Value: 03/04/2013 12:25     Performed at [facility]   Culture     Final   Value: NO GROWTH     Performed at [facility]   Report Status 03/05/2013 FINAL   Final  CULTURE, BLOOD (ROUTINE X 2)     Status: None   Collection Time    03/04/13  6:45 AM      Result Value Range Status    Specimen Description BLOOD LEFT ARM   Final   Special Requests BOTTLES DRAWN AEROBIC AND ANAEROBIC 5CC   Final   Culture  Setup Time     Final   Value: 03/04/2013 08:38     Performed at [facility]   Culture     Final   Value: NO GROWTH 5 DAYS     Performed at [facility]   Report Status 03/10/2013 FINAL   Final  CULTURE, BLOOD (ROUTINE X 2)     Status: None   Collection Time    03/04/13  6:50 AM      Result Value Range Status   Specimen Description BLOOD LEFT HAND   Final   Special Requests BOTTLES DRAWN AEROBIC AND ANAEROBIC 5CC   Final   Culture  Setup Time     Final  Value: 03/04/2013 08:38     Performed at [facility]   Culture     Final   Value: NO GROWTH 5 DAYS     Performed at [facility]   Report Status 03/10/2013 FINAL   Final     Labs: Basic Metabolic Panel:  Recent Labs Lab 03/04/13 0040 03/04/13 0059 03/04/13 0645 03/05/13 0457 03/08/13 0510  NA 135 138  --  133* 133*  K 4.0 4.6  --  4.0 3.6  CL 106 107  --  107 105  CO2 21  --   --  20 22  GLUCOSE 94 85  --  80 84  BUN 19 26*  --  17 17  CREATININE 0.75 1.00 0.75 0.81 0.70  CALCIUM 9.4  --   --  9.0 8.7   Liver Function Tests:  Recent Labs Lab 03/04/13 0040  AST 41*  ALT 15  ALKPHOS 179*  BILITOT 1.8*  PROT 5.7*  ALBUMIN 2.8*   No results found for this basename: LIPASE, AMYLASE,  in the last 168 hours No results found for this basename: AMMONIA,  in the last 168 hours CBC:  Recent Labs Lab 03/04/13 0040 03/04/13 0059 03/04/13 0645 03/05/13 0457 03/08/13 0510  WBC 14.4*  --  11.7* 9.7 8.3  NEUTROABS  --   --   --  6.0  --   HGB 11.8* 12.2* 10.5* 9.9* 11.1*  HCT 33.7* 36.0* 30.3* 28.3* 31.6*  MCV 91.8  --  91.8 91.6 90.5  PLT 86*  --  75* 88* 170   Cardiac Enzymes: No results found for this basename: CKTOTAL, CKMB, CKMBINDEX, TROPONINI,  in the last 168 hours BNP: BNP (last 3 results) No results found for this basename: PROBNP,  in the last  8760 hours CBG: No results found for this basename: GLUCAP,  in the last 168 hours     Signed:  Sarahbeth Cashin  [facility] 03/10/2013, 12:20 PM

## 2013-03-10 NOTE — Progress Notes (Signed)
Patient is set to discharge to [facility]. [employer] insurance authorization obtained. Discharge packet given to RN, Connye Burkitt. [facility] called for transport.   Clinical Social Work Department CLINICAL SOCIAL WORK PLACEMENT NOTE 03/10/2013  Patient:  Raymond Larsen, Raymond Larsen  Account Number:  [id number] Admit date:  03/03/2013  Clinical Social Worker:  Orpah Greek  Date/time:  03/07/2013 11:44 AM  Clinical Social Work is seeking post-discharge placement for this patient at the following level of care:   SKILLED NURSING   (*CSW will update this form in Epic as items are completed)   03/07/2013  Patient/family provided with [facility] Department of Clinical Social Work's list of facilities offering this level of care within the geographic area requested by the patient (or if unable, by the patient's family).  03/07/2013  Patient/family informed of their freedom to choose among providers that offer the needed level of care, that participate in Medicare, Medicaid or managed care program needed by the patient, have an available bed and are willing to accept the patient.  03/07/2013  Patient/family informed of [facility]' ownership interest in [facility], as well as of the fact that they are under no obligation to receive care at this facility.  PASARR submitted to EDS on 03/07/2013 PASARR number received from EDS on 03/07/2013  FL2 transmitted to all facilities in geographic area requested by pt/family on  03/07/2013 FL2 transmitted to all facilities within larger geographic area on   Patient informed that his/her managed care company has contracts with or will negotiate with  certain facilities, including the following:   [facility]     Patient/family informed of bed offers received:  03/07/2013 Patient chooses bed at [facility] Physician recommends and patient chooses bed at    Patient to be transferred to [facility] on  03/10/2013 Patient  to be transferred to facility by [facility]  The following physician request were entered in Epic:   Additional Comments:   Unice Bailey, LCSW [facility] Clinical Social Worker cell #: [phone]

## 2013-03-10 NOTE — Progress Notes (Signed)
Occupational Therapy Treatment Patient Details Name: Raymond Moorer Sr. MRN: [id number] DOB: 12-Jan-1923 Today's Date: 03/10/2013 Time: 8295-6213 OT Time Calculation (min): 36 min  OT Assessment / Plan / Recommendation  History of present illness Pt is an 79 y.o. male lives at home with his elderly wife, having hx of HTN, hyperlipidemia, hx of afib, prior hx of alcoholism with ascites, presents to the ER via EMS with hx of fever of 101, coughs of yellow sputum, and feeling malaise.  He was seen by Dr [name], diagnosed with bronchitis, and was placed on Augmentin with steroids a few weeks ago.  He hadn't gotten better.  He has no chest pain, abdominal pain, nausea or vomiting.  He reportedly had foul smelling urine at home.  He had hx of prostatitis in the past, but hadn't complaint of any prostatism.  Evaluation in the ER included a CXR which showed interstitial edema, PNA would have similar appearance, leukocytosis with WBC of 14K, and normal renal fx tests. His UA was negative.  Hospitalist was asked to admit him for the above reasons suggestive of pneumonia.  R humerus imaging negative for fracture.   OT comments  Good participation this OT visit  Follow Up Recommendations  SNF;Supervision/Assistance - 24 hour       Equipment Recommendations  None recommended by OT          Progress towards OT Goals Progress towards OT goals: Progressing toward goals  Plan Discharge plan remains appropriate    Precautions / Restrictions Precautions Precautions: Fall Precaution Comments: pt c/o R shoulder pain but imaging negative for fracture. Restrictions Weight Bearing Restrictions: No       ADL  Grooming: Wash/dry face;Minimal assistance Where Assessed - Grooming: Supported sitting Toilet Transfer: Performed;Minimal Dentist Method: Sit to stand;Stand pivot Acupuncturist: Materials engineer and Hygiene: Maximal assistance Where  Assessed - Glass blower/designer Manipulation and Hygiene: Standing ADL Comments: Pt in bed wet and soiled upon OT arrival.  Pt agree to get to [facility] and then to chair with OT        Visit Information  Last OT Received On: 03/10/13 Assistance Needed: +1 History of Present Illness: Pt is an 79 y.o. male lives at home with his elderly wife, having hx of HTN, hyperlipidemia, hx of afib, prior hx of alcoholism with ascites, presents to the ER via EMS with hx of fever of 101, coughs of yellow sputum, and feeling malaise.  He was seen by Dr [name], diagnosed with bronchitis, and was placed on Augmentin with steroids a few weeks ago.  He hadn't gotten better.  He has no chest pain, abdominal pain, nausea or vomiting.  He reportedly had foul smelling urine at home.  He had hx of prostatitis in the past, but hadn't complaint of any prostatism.  Evaluation in the ER included a CXR which showed interstitial edema, PNA would have similar appearance, leukocytosis with WBC of 14K, and normal renal fx tests. His UA was negative.  Hospitalist was asked to admit him for the above reasons suggestive of pneumonia.  R humerus imaging negative for fracture.          Cognition  Cognition Arousal/Alertness: Awake/alert Behavior During Therapy: WFL for tasks assessed/performed Overall Cognitive Status: No family/caregiver present to determine baseline cognitive functioning    Mobility  Bed Mobility Bed Mobility: Supine to Sit;Sitting - Scoot to Edge of Bed;Sit to Supine Supine to Sit: 4: Min assist Sitting - Scoot to [place] of  Bed: 4: Min assist Sit to Supine: 3: Mod assist Details for Bed Mobility Assistance: assist for trunk Transfers Transfers: Sit to Stand;Stand to Sit Sit to Stand: 4: Min assist;From bed;With upper extremity assist;From chair/3-in-1 Stand to Sit: 4: Min guard;With upper extremity assist;To bed;To chair/3-in-1 Details for Transfer Assistance: assist to rise and steady and min guard to control  descent. VC's for hand placement.          End of Session OT - End of Session Equipment Utilized During Treatment: Gait belt Activity Tolerance: Patient tolerated treatment well Patient left: with call bell/phone within reach;with chair alarm set;in chair Nurse Communication: Mobility status  GO     Alba Cory 03/10/2013, 12:13 PM

## 2013-04-08 ENCOUNTER — Telehealth: Payer: Self-pay | Admitting: Family Medicine

## 2013-04-08 NOTE — Telephone Encounter (Signed)
Patient's daughter called stating that pt has been in [facility] for about a month now.  Patient has now developed C-diff and patient's daughter is very concerned for his well being in facility.  She states that she can't get in touch with his dr there and wondered if they sent records to you about his condition.  Please advise.  Raymond Larsen would like a call back if possible [phone] or [phone].

## 2013-04-08 NOTE — Telephone Encounter (Signed)
I have not received any paperwork or other correspondence about Raymond Larsen. Pls call Merle and tell her I'll try to give her a call but it will likely be after 4pm.

## 2013-04-09 NOTE — Telephone Encounter (Signed)
LM on VM of both phone #s given for daughter [name].

## 2013-04-21 ENCOUNTER — Other Ambulatory Visit: Payer: Self-pay | Admitting: Family Medicine

## 2013-04-21 MED ORDER — OSELTAMIVIR PHOSPHATE 75 MG PO CAPS: ORAL_CAPSULE | ORAL | Status: DC

## 2013-04-21 NOTE — Progress Notes (Signed)
Patient's wife in for o/v today with ILI. Raymond Larsen is at home apparently with same sx's. I sent in tamiflu rx for Raymond Larsen today, he has o/v scheduled in 4d already.

## 2013-04-22 ENCOUNTER — Telehealth: Payer: Self-pay | Admitting: Family Medicine

## 2013-04-22 NOTE — Telephone Encounter (Signed)
°  Patient Information:  Caller Name: Alona Bene  Phone: [phone]  Patient: Raymond Larsen, Raymond Larsen  Gender: Male  DOB: 1923/01/07  Age: 79 Years  PCP: Earley F[facility])  Office Follow Up:  Does the office need to follow up with this patient?: No  Instructions For The Office: N/A  RN Note:  Phone call originally from daughter/Joyce, conferenced with Chrissie Noa (step son who is currently in the home trying to care for patient and patient's wife)  Symptoms  Reason For Call & Symptoms: Cough, sneezing, respiratory illness started with body aches, not feeling good.  Started on Tamiflu yesterday 12/29 after speaking with MD.  Patient has gotten weaker since yesterday, suddenly not able ot stand or sit without some holding him up, not eating, feeling condition currently critical  Reviewed Health History In EMR: Yes  Reviewed Medications In EMR: Yes  Reviewed Allergies In EMR: Yes  Reviewed Surgeries / Procedures: Yes  Date of Onset of Symptoms: 04/19/2013  Treatments Tried: Tamiflu since yesterday 12/29, giving Tylenol  Treatments Tried Worked: No  Guideline(s) Used:  Influenza - Seasonal  Disposition Per Guideline:   Call EMS 911 Now  Reason For Disposition Reached:   Shock suspected (e.g., cold/pale/clammy skin, too weak to stand)  Advice Given:  N/A  Patient Will Follow Care Advice:  YES

## 2013-04-30 ENCOUNTER — Other Ambulatory Visit: Payer: Self-pay | Admitting: *Deleted

## 2013-04-30 ENCOUNTER — Encounter: Payer: Self-pay | Admitting: Family Medicine

## 2013-04-30 ENCOUNTER — Ambulatory Visit (INDEPENDENT_AMBULATORY_CARE_PROVIDER_SITE_OTHER): Payer: Medicare HMO | Admitting: Family Medicine

## 2013-04-30 VITALS — BP 160/82 | HR 72 | Temp 98.5°F | Resp 18 | Ht 67.0 in | Wt 152.0 lb

## 2013-04-30 DIAGNOSIS — IMO0001 Reserved for inherently not codable concepts without codable children: Secondary | ICD-10-CM

## 2013-04-30 DIAGNOSIS — N189 Chronic kidney disease, unspecified: Secondary | ICD-10-CM

## 2013-04-30 DIAGNOSIS — Z111 Encounter for screening for respiratory tuberculosis: Secondary | ICD-10-CM

## 2013-04-30 DIAGNOSIS — J1100 Influenza due to unidentified influenza virus with unspecified type of pneumonia: Secondary | ICD-10-CM

## 2013-04-30 DIAGNOSIS — K7460 Unspecified cirrhosis of liver: Secondary | ICD-10-CM

## 2013-04-30 DIAGNOSIS — G8929 Other chronic pain: Secondary | ICD-10-CM

## 2013-04-30 DIAGNOSIS — M7918 Myalgia, other site: Secondary | ICD-10-CM

## 2013-04-30 MED ORDER — HYDROCODONE-ACETAMINOPHEN 5-325 MG PO TABS: ORAL_TABLET | ORAL | Status: DC

## 2013-04-30 MED ORDER — PREDNISONE 20 MG PO TABS: ORAL_TABLET | ORAL | Status: DC

## 2013-04-30 MED ORDER — CEFUROXIME AXETIL 500 MG PO TABS: ORAL_TABLET | ORAL | Status: DC

## 2013-04-30 NOTE — Progress Notes (Signed)
OFFICE NOTE  04/30/2013  CC:  Chief Complaint  Patient presents with  . Cough  . Follow-up     HPI: Patient is a 80 y.o. Caucasian male who is here for f/u recent respiratory infection, treated empirically with tamiflu.  Feels improved but still with quite a bit of productive cough, tightness in chest with walking.  Energy is up, appetite is up.  No recent fevers.  No recent n/v.  Drinking water, tea, some orange juice, and milk.  Other interval history:  Admission for pneumonia 02/2013, then went to rehab for about 5 weeks and during that time he had C diff.  He has finished abx for this (C diff).  Stools are normal-appearing now, occuring once daily.    He has been taking hydrocodone pain med for hx of right shoulder and left knee arthritis, did not get this re-prescribed at rehab so needs more of this today.  He has no hx of oversedation from this med.  Has not been taking his lasix or aldactone for the last 2 and 1/2 wks due to illness and his wt was down < 140. Last couple of days they have noted more swelling in LE's.  Pertinent PMH:  Past Medical History  Diagnosis Date  . Arthritis   . Hyperlipidemia   . Hypertension   . A-fib     Dr Wynonia Lawman; WAS on Coumadin until 04/2012  . Angina     uses NTG prn  . Pneumonia     12 /2012  . Recurrent upper respiratory infection (URI)     03/2011  . Hypothyroidism     x 10 yrs  . GERD (gastroesophageal reflux disease)   . Constipation, chronic   . Depression   . History of dizziness   . Anxiety   . Urinary frequency   . Hearing loss of both ears     wears hearing aides  . BPH with obstruction/lower urinary tract symptoms     sees Dr Gaynelle Arabian  . Compression fracture of thoracic vertebra, non-traumatic 06/14/12    T6 and T7 (noted on CT angio chest to r/o PE)  . Alcoholic cirrhosis of liver with ascites 06/14/12    Noted on CT angio chest to r/o PE  . Esophageal varices in alcoholic cirrhosis "   "    "   "    "    MEDS:   Outpatient Prescriptions Prior to Visit  Medication Sig Dispense Refill  . aspirin 81 MG tablet Take 81 mg by mouth daily.      . fenofibrate 160 MG tablet Take 160 mg by mouth daily.       . finasteride (PROSCAR) 5 MG tablet       . fish oil-omega-3 fatty acids 1000 MG capsule Take 1 g by mouth 2 (two) times daily.      . furosemide (LASIX) 40 MG tablet Take 40 mg by mouth every other day.      . levothyroxine (SYNTHROID, LEVOTHROID) 100 MCG tablet Take 100 mcg by mouth daily.      Marland Kitchen LORazepam (ATIVAN) 0.5 MG tablet Take 1-2 tablets (0.5-1 mg total) by mouth at bedtime. As needed for sleep  15 tablet  0  . meclizine (ANTIVERT) 25 MG tablet Take 1 tablet (25 mg total) by mouth 2 (two) times daily as needed. For dizziness  30 tablet  6  . metoprolol tartrate (LOPRESSOR) 25 MG tablet Take 1 tablet (25 mg total) by mouth 2 (two) times  daily.  60 tablet  5  . mirtazapine (REMERON) 30 MG tablet Take 30 mg by mouth At bedtime.       . nitroGLYCERIN (NITROSTAT) 0.4 MG SL tablet Place 0.4 mg under the tongue every 5 (five) minutes as needed. For chest pain      . pantoprazole (PROTONIX) 40 MG tablet Take 40 mg by mouth daily.       . pravastatin (PRAVACHOL) 40 MG tablet Take 2 tablets (80 mg total) by mouth daily.  90 tablet  1  . spironolactone (ALDACTONE) 25 MG tablet Take 25 mg by mouth every other day.      Marland Kitchen guaiFENesin (MUCINEX) 600 MG 12 hr tablet Take 1 tablet (600 mg total) by mouth 2 (two) times daily.      Marland Kitchen HYDROcodone-acetaminophen (NORCO/VICODIN) 5-325 MG per tablet Take 1 tablet by mouth every 6 (six) hours as needed.  60 tablet  0  . Maltodextrin-Xanthan Gum (RESOURCE THICKENUP CLEAR) POWD AS NEEDED.      Marland Kitchen oseltamivir (TAMIFLU) 75 MG capsule 1 tab po bid x 5d  10 capsule  0  . polyethylene glycol (MIRALAX / GLYCOLAX) packet Take 17 g by mouth 2 (two) times daily.        No facility-administered medications prior to visit.    PE: Blood pressure 160/82, pulse 72, temperature 98.5  F (36.9 C), temperature source Temporal, resp. rate 18, height 5\' 7"  (1.702 m), weight 152 lb (68.947 kg), SpO2 97.00%. Gen: Alert, elderly, tired- appearing but NAD.  Patient is oriented to person, place, time, and situation.  Sitting up in wheelchair. QIO:NGEX: no injection, icteris, swelling, or exudate.  EOMI, PERRLA. Mouth: lips without lesion/swelling.  Oral mucosa pink and moist. Oropharynx without erythema, exudate, or swelling.  Neck - No masses or thyromegaly or limitation in range of motion CV: RRR, normal S1 and S2 LUNGS: right basilar inspiratory crackles, otherwise clear and with good aeration.  No tachypnea.  IMPRESSION AND PLAN:  Influenza with pneumonia Ceftin 500mg  bid x 10d. Prednisone 40mg  qd x 5d. Probiotic once daily. Check CBC and CMET today.  Liver cirrhosis With hx of peripheral edema and ascites. His wt is back up and he has been off of his lasix and aldactone so we'll have him restart one tab of each EVERY OTHER DAY as per his previous regimen. If wt gets to 140 lbs he can stop diuretics.  Chronic musculoskeletal pain He takes vicodin fairly sparingly for this, mainly in right shoulder and left knee. He tolerates this med well.  Will do rx today for vicodin 5/325, #90 today.   TB test done today.  Will fill out paperwork for his upcoming entrance into an assisted living facility.  An After Visit Summary was printed and given to the patient.  FOLLOW UP: 10d in office.  F/u 2d to get TB test read.

## 2013-04-30 NOTE — Assessment & Plan Note (Signed)
He takes vicodin fairly sparingly for this, mainly in right shoulder and left knee. He tolerates this med well.  Will do rx today for vicodin 5/325, #90 today.

## 2013-04-30 NOTE — Assessment & Plan Note (Signed)
Ceftin 500mg  bid x 10d. Prednisone 40mg  qd x 5d. Probiotic once daily. Check CBC and CMET today.

## 2013-04-30 NOTE — Assessment & Plan Note (Signed)
With hx of peripheral edema and ascites. His wt is back up and he has been off of his lasix and aldactone so we'll have him restart one tab of each EVERY OTHER DAY as per his previous regimen. If wt gets to 140 lbs he can stop diuretics.

## 2013-04-30 NOTE — Progress Notes (Signed)
Pre visit review using our clinic review tool, if applicable. No additional management support is needed unless otherwise documented below in the visit note. 

## 2013-05-01 LAB — CBC WITH DIFFERENTIAL/PLATELET
BASOS PCT: 0.1 % (ref 0.0–3.0)
Basophils Absolute: 0 10*3/uL (ref 0.0–0.1)
EOS PCT: 14.2 % — AB (ref 0.0–5.0)
Eosinophils Absolute: 0.6 10*3/uL (ref 0.0–0.7)
HCT: 34.5 % — ABNORMAL LOW (ref 39.0–52.0)
HEMOGLOBIN: 11.5 g/dL — AB (ref 13.0–17.0)
LYMPHS PCT: 32.5 % (ref 12.0–46.0)
Lymphs Abs: 1.3 10*3/uL (ref 0.7–4.0)
MCHC: 33.3 g/dL (ref 30.0–36.0)
MCV: 93.1 fl (ref 78.0–100.0)
Monocytes Absolute: 0.5 10*3/uL (ref 0.1–1.0)
Monocytes Relative: 12.3 % — ABNORMAL HIGH (ref 3.0–12.0)
Neutro Abs: 1.6 10*3/uL (ref 1.4–7.7)
Neutrophils Relative %: 40.9 % — ABNORMAL LOW (ref 43.0–77.0)
Platelets: 135 10*3/uL — ABNORMAL LOW (ref 150.0–400.0)
RBC: 3.7 Mil/uL — AB (ref 4.22–5.81)
RDW: 17.3 % — ABNORMAL HIGH (ref 11.5–14.6)
WBC: 4 10*3/uL — AB (ref 4.5–10.5)

## 2013-05-01 LAB — COMPREHENSIVE METABOLIC PANEL
ALBUMIN: 2.6 g/dL — AB (ref 3.5–5.2)
ALT: 18 U/L (ref 0–53)
AST: 54 U/L — AB (ref 0–37)
Alkaline Phosphatase: 128 U/L — ABNORMAL HIGH (ref 39–117)
BUN: 10 mg/dL (ref 6–23)
CALCIUM: 8.6 mg/dL (ref 8.4–10.5)
CO2: 27 meq/L (ref 19–32)
Chloride: 112 mEq/L (ref 96–112)
Creatinine, Ser: 0.7 mg/dL (ref 0.4–1.5)
GFR: 118.91 mL/min (ref >60.00)
Glucose, Bld: 94 mg/dL (ref 70–99)
POTASSIUM: 4.8 meq/L (ref 3.5–5.1)
Sodium: 141 mEq/L (ref 135–145)
Total Bilirubin: 1.1 mg/dL (ref 0.3–1.2)
Total Protein: 5.2 g/dL — ABNORMAL LOW (ref 6.0–8.3)

## 2013-05-02 ENCOUNTER — Ambulatory Visit (INDEPENDENT_AMBULATORY_CARE_PROVIDER_SITE_OTHER): Payer: Medicare HMO | Admitting: Family Medicine

## 2013-05-02 DIAGNOSIS — Z111 Encounter for screening for respiratory tuberculosis: Secondary | ICD-10-CM

## 2013-05-02 LAB — TB SKIN TEST
INDURATION: 0 mm
TB Skin Test: NEGATIVE

## 2013-05-02 NOTE — Addendum Note (Signed)
Addended by: Ralph Dowdy on: 05/02/2013 02:41 PM   Modules accepted: Orders

## 2013-05-05 ENCOUNTER — Encounter: Payer: Self-pay | Admitting: Family Medicine

## 2013-05-09 ENCOUNTER — Ambulatory Visit (INDEPENDENT_AMBULATORY_CARE_PROVIDER_SITE_OTHER): Payer: Medicare HMO | Admitting: Family Medicine

## 2013-05-09 ENCOUNTER — Encounter: Payer: Self-pay | Admitting: Family Medicine

## 2013-05-09 VITALS — BP 130/70 | HR 67 | Temp 98.5°F | Resp 18 | Ht 67.0 in | Wt 155.0 lb

## 2013-05-09 DIAGNOSIS — E039 Hypothyroidism, unspecified: Secondary | ICD-10-CM

## 2013-05-09 DIAGNOSIS — K7460 Unspecified cirrhosis of liver: Secondary | ICD-10-CM

## 2013-05-09 DIAGNOSIS — I4891 Unspecified atrial fibrillation: Secondary | ICD-10-CM

## 2013-05-09 DIAGNOSIS — J1100 Influenza due to unidentified influenza virus with unspecified type of pneumonia: Secondary | ICD-10-CM

## 2013-05-09 DIAGNOSIS — R609 Edema, unspecified: Secondary | ICD-10-CM

## 2013-05-09 NOTE — Progress Notes (Signed)
Pre visit review using our clinic review tool, if applicable. No additional management support is needed unless otherwise documented below in the visit note. 

## 2013-05-09 NOTE — Progress Notes (Signed)
OFFICE NOTE  05/09/2013  CC:  Chief Complaint  Patient presents with  . Follow-up     HPI: Patient is a 80 y.o. Caucasian male who is here for f/u recent bronchopneumonia. Feeling much better: no fevers, cough improving--no purulent mucous.  No SOB. Eating very well now. He is still getting settled into his ALF.  Pertinent PMH:  Past Medical History  Diagnosis Date  . Arthritis   . Hyperlipidemia   . Hypertension   . A-fib     Dr Wynonia Lawman; WAS on Coumadin until 04/2012  . Angina     uses NTG prn  . Pneumonia     12 /2012  . Recurrent upper respiratory infection (URI)     03/2011  . Hypothyroidism     x 10 yrs  . GERD (gastroesophageal reflux disease)   . Constipation, chronic   . Depression   . History of dizziness   . Anxiety   . Urinary frequency   . Hearing loss of both ears     wears hearing aides  . BPH with obstruction/lower urinary tract symptoms     sees Dr Gaynelle Arabian  . Compression fracture of thoracic vertebra, non-traumatic 06/14/12    T6 and T7 (noted on CT angio chest to r/o PE)  . Alcoholic cirrhosis of liver with ascites 06/14/12    Noted on CT angio chest to r/o PE  . Esophageal varices in alcoholic cirrhosis "   "    "   "    "    MEDS:  Outpatient Prescriptions Prior to Visit  Medication Sig Dispense Refill  . aspirin 81 MG tablet Take 81 mg by mouth daily.      . cefUROXime (CEFTIN) 500 MG tablet 1 tab po bid x 10d  20 tablet  0  . fenofibrate 160 MG tablet Take 160 mg by mouth daily.       . finasteride (PROSCAR) 5 MG tablet       . fish oil-omega-3 fatty acids 1000 MG capsule Take 1 g by mouth 2 (two) times daily.      . furosemide (LASIX) 40 MG tablet Take 40 mg by mouth every other day.      Marland Kitchen HYDROcodone-acetaminophen (NORCO/VICODIN) 5-325 MG per tablet 1-2 tabs po twice daily as needed for moderate/severe pain  90 tablet  0  . levothyroxine (SYNTHROID, LEVOTHROID) 100 MCG tablet Take 100 mcg by mouth daily.      Marland Kitchen LORazepam (ATIVAN) 0.5  MG tablet Take 1-2 tablets (0.5-1 mg total) by mouth at bedtime. As needed for sleep  15 tablet  0  . Maltodextrin-Xanthan Gum (RESOURCE THICKENUP CLEAR) POWD AS NEEDED.      Marland Kitchen meclizine (ANTIVERT) 25 MG tablet Take 1 tablet (25 mg total) by mouth 2 (two) times daily as needed. For dizziness  30 tablet  6  . metoprolol tartrate (LOPRESSOR) 25 MG tablet Take 1 tablet (25 mg total) by mouth 2 (two) times daily.  60 tablet  5  . mirtazapine (REMERON) 30 MG tablet Take 30 mg by mouth At bedtime.       . nitroGLYCERIN (NITROSTAT) 0.4 MG SL tablet Place 0.4 mg under the tongue every 5 (five) minutes as needed. For chest pain      . pantoprazole (PROTONIX) 40 MG tablet Take 40 mg by mouth daily.       . polyethylene glycol (MIRALAX / GLYCOLAX) packet Take 17 g by mouth 2 (two) times daily.       Marland Kitchen  pravastatin (PRAVACHOL) 40 MG tablet Take 2 tablets (80 mg total) by mouth daily.  90 tablet  1  . predniSONE (DELTASONE) 20 MG tablet 2 tabs po qd x 5d  10 tablet  0  . spironolactone (ALDACTONE) 25 MG tablet Take 25 mg by mouth every other day.      Marland Kitchen guaiFENesin (MUCINEX) 600 MG 12 hr tablet Take 1 tablet (600 mg total) by mouth 2 (two) times daily.       No facility-administered medications prior to visit.    PE: Blood pressure 130/70, pulse 67, temperature 98.5 F (36.9 C), temperature source Temporal, resp. rate 18, height 5\' 7"  (1.702 m), weight 155 lb (70.308 kg), SpO2 97.00%. Gen: Alert, well appearing.  Patient is oriented to person, place, time, and situation. CV: RRR, no m/r/g Chest is clear, no wheezing or rales. Normal symmetric air entry throughout both lung fields. No chest wall deformities or tenderness. ABD: soft, ND/NT Both knees hypertrophic but without erythema or warmth or effusion.  Mild peripatellar TTP medially on left knee.  No joint line tenderness.  Flexion/extension intact and without pain. +crepitus.  Patellar grind on left mildly uncomfortable. LE's with 3+ pitting edema from  knees down into tops of feet bilat.  No skin ulcerations.   SKIN: no pallor or jaundice.  LAB: none today. Lab Results  Component Value Date   WBC 4.0* 04/30/2013   HGB 11.5* 04/30/2013   HCT 34.5* 04/30/2013   MCV 93.1 04/30/2013   PLT 135.0 Repeated and verified X2.* 04/30/2013     Chemistry      Component Value Date/Time   NA 141 04/30/2013 1540   K 4.8 04/30/2013 1540   CL 112 04/30/2013 1540   CO2 27 04/30/2013 1540   BUN 10 04/30/2013 1540   CREATININE 0.7 04/30/2013 1540   CREATININE 0.85 07/25/2012 1619      Component Value Date/Time   CALCIUM 8.6 04/30/2013 1540   ALKPHOS 128* 04/30/2013 1540   AST 54* 04/30/2013 1540   ALT 18 04/30/2013 1540   BILITOT 1.1 04/30/2013 1540     Lab Results  Component Value Date   TSH 0.124* 03/04/2013    IMPRESSION AND PLAN:  1) Bronchopneumonia, resolving appropriately.  Finish ceftin.  2) Cirrhosis with portal HTN and peripheral edema:  LE edema unchanged.  I want be a little more relaxed with his wt goal so will shoot for goal of about 150 lbs.  No change in diuretics today: continue aldactone and lasix every OTHER day.  3) A-fib: sounds like he is currently in NSR.  Continue lopressor and aspirin.  Not a coumadin candidate due to fall risk.  4) Hypothyroidism: last TSH a little low while in hospital, no dose change in synthroid was made.  Will recheck TSH at next f/u visit.  FOLLOW UP: 6 wks

## 2013-05-15 ENCOUNTER — Ambulatory Visit (INDEPENDENT_AMBULATORY_CARE_PROVIDER_SITE_OTHER): Payer: Medicare HMO | Admitting: Family Medicine

## 2013-05-15 ENCOUNTER — Encounter: Payer: Self-pay | Admitting: Family Medicine

## 2013-05-15 VITALS — BP 116/71 | HR 72 | Temp 99.1°F | Resp 18 | Ht 67.0 in | Wt 155.0 lb

## 2013-05-15 DIAGNOSIS — M19019 Primary osteoarthritis, unspecified shoulder: Secondary | ICD-10-CM | POA: Insufficient documentation

## 2013-05-15 DIAGNOSIS — M1712 Unilateral primary osteoarthritis, left knee: Secondary | ICD-10-CM

## 2013-05-15 DIAGNOSIS — M1710 Unilateral primary osteoarthritis, unspecified knee: Secondary | ICD-10-CM

## 2013-05-15 DIAGNOSIS — IMO0002 Reserved for concepts with insufficient information to code with codable children: Secondary | ICD-10-CM

## 2013-05-15 MED ORDER — METHYLPREDNISOLONE ACETATE 40 MG/ML IJ SUSP
40.0000 mg | Freq: Once | INTRAMUSCULAR | Status: AC
  Administered 2013-05-15: 40 mg via INTRA_ARTICULAR

## 2013-05-15 NOTE — Progress Notes (Signed)
OFFICE NOTE  05/15/2013  CC:  Chief Complaint  Patient presents with  . Leg Pain  . Arm Pain     HPI: Patient is a 80 y.o. Caucasian male who is here for right arm pain and left knee pain. Had a "run in" with stepson a couple of nights ago and got very upset and pt's bp went up (180s syst is all they could tell me), c/o left shoulder hurting and family got concerned.  No CP, no SOB, no nausea, no jaw pain. Chronic bilateral shoulder pains and diminished ROM.  Since being in ALF he has been having a bit more difficulty with pain b/c his meds are not given right when he needs them. No known falls. Left knee hurting more lately and having warmth.  His pain is almost exclusively with wt bearing but does make him feel like he may fall at times.  A steroid injection here in the past has helped this knee, most recent injection was definitely > 37mo ago.  Pertinent PMH:  Past Medical History  Diagnosis Date  . Arthritis   . Hyperlipidemia   . Hypertension   . A-fib     Dr Wynonia Lawman; WAS on Coumadin until 04/2012  . Angina     uses NTG prn  . Pneumonia     12 /2012  . Recurrent upper respiratory infection (URI)     03/2011  . Hypothyroidism     x 10 yrs  . GERD (gastroesophageal reflux disease)   . Constipation, chronic   . Depression   . History of dizziness   . Anxiety   . Urinary frequency   . Hearing loss of both ears     wears hearing aides  . BPH with obstruction/lower urinary tract symptoms     sees Dr Gaynelle Arabian  . Compression fracture of thoracic vertebra, non-traumatic 06/14/12    T6 and T7 (noted on CT angio chest to r/o PE)  . Alcoholic cirrhosis of liver with ascites 06/14/12    Noted on CT angio chest to r/o PE  . Esophageal varices in alcoholic cirrhosis "   "    "   "    "   Past Surgical History  Procedure Laterality Date  . Total knee arthroplasty  1994    right  . Total shoulder replacement  1997    right  . Joint replacement      Rt knee and shoulder  .  Hernia repair  1990    LIH repair  . Cholecystectomy  2004  . Inguinal hernia repair  05/09/2011    Procedure: HERNIA REPAIR INGUINAL ADULT;  Surgeon: Odis Hollingshead, MD;  Location: [place];  Service: General;  Laterality: Right;    MEDS:  Outpatient Prescriptions Prior to Visit  Medication Sig Dispense Refill  . aspirin 81 MG tablet Take 81 mg by mouth daily.      . cefUROXime (CEFTIN) 500 MG tablet 1 tab po bid x 10d  20 tablet  0  . fenofibrate 160 MG tablet Take 160 mg by mouth daily.       . finasteride (PROSCAR) 5 MG tablet       . fish oil-omega-3 fatty acids 1000 MG capsule Take 1 g by mouth 2 (two) times daily.      . furosemide (LASIX) 40 MG tablet Take 40 mg by mouth every other day.      Marland Kitchen HYDROcodone-acetaminophen (NORCO/VICODIN) 5-325 MG per tablet 1-2 tabs po twice daily  as needed for moderate/severe pain  90 tablet  0  . levothyroxine (SYNTHROID, LEVOTHROID) 100 MCG tablet Take 100 mcg by mouth daily.      Marland Kitchen LORazepam (ATIVAN) 0.5 MG tablet Take 1-2 tablets (0.5-1 mg total) by mouth at bedtime. As needed for sleep  15 tablet  0  . Maltodextrin-Xanthan Gum (RESOURCE THICKENUP CLEAR) POWD AS NEEDED.      Marland Kitchen meclizine (ANTIVERT) 25 MG tablet Take 1 tablet (25 mg total) by mouth 2 (two) times daily as needed. For dizziness  30 tablet  6  . metoprolol tartrate (LOPRESSOR) 25 MG tablet Take 1 tablet (25 mg total) by mouth 2 (two) times daily.  60 tablet  5  . mirtazapine (REMERON) 30 MG tablet Take 30 mg by mouth At bedtime.       . nitroGLYCERIN (NITROSTAT) 0.4 MG SL tablet Place 0.4 mg under the tongue every 5 (five) minutes as needed. For chest pain      . pantoprazole (PROTONIX) 40 MG tablet Take 40 mg by mouth daily.       . polyethylene glycol (MIRALAX / GLYCOLAX) packet Take 17 g by mouth 2 (two) times daily.       . pravastatin (PRAVACHOL) 40 MG tablet Take 2 tablets (80 mg total) by mouth daily.  90 tablet  1  . predniSONE (DELTASONE) 20 MG tablet 2 tabs po qd x 5d  10  tablet  0  . spironolactone (ALDACTONE) 25 MG tablet Take 25 mg by mouth every other day.       No facility-administered medications prior to visit.    PE: Blood pressure 116/71, pulse 72, temperature 99.1 F (37.3 C), temperature source Temporal, resp. rate 18, height 5\' 7"  (1.702 m), weight 155 lb (70.308 kg), SpO2 97.00%. Gen: Alert, well appearing.  Patient is oriented to person, place, time, and situation. CV: RRR, 1/6 syst m, no r/g. Shoulder ROM markedly impaired in abduction/flexion/extension, mild TTP anteriorly. Left knee with hypertrophy but minimal effusion, mild warmth, no erythema.  Flexion and extension are full w/out much discomfort.  He has TTP anteriorly in left knee around patellar region diffusely.  +mild crepitus.  IMPRESSION AND PLAN:  1) Bilateral shoulder arthritis: stable.  Continue with vicodin 5/325 and will change this to 1 tab three times per day Scheduled.  New order faxed to his ALF.  2) Left knee osteoarthritis: pain med as stated above Steroid knee injection today.  Procedure: Therapeutic knee injection.  The patient's clinical condition is marked by substantial pain and/or significant functional disability.  Other conservative therapy has not provided relief, is contraindicated, or not appropriate.  There is a reasonable likelihood that injection will significantly improve the patient's pain and/or functional disability. Cleaned skin with alcohol swab, used anterior approach with pt sitting upright to enter knee joint, Injected 1  ml of  40mg /ml Depo medrol + 2 ml of 1% lidocaine w/out epi into joint space without resistance.  No immediate complications.  Patient tolerated procedure well.  Post-injection care discussed, including 20 min of icing 1-2 times in the next 4-8 hours, frequent non weight-bearing ROM exercises over the next few days, and general pain medication management.  An After Visit Summary was printed and given to the patient.  FOLLOW UP:  prn

## 2013-05-15 NOTE — Progress Notes (Signed)
Pre visit review using our clinic review tool, if applicable. No additional management support is needed unless otherwise documented below in the visit note. 

## 2013-05-24 ENCOUNTER — Encounter (HOSPITAL_COMMUNITY): Payer: Self-pay | Admitting: Emergency Medicine

## 2013-05-24 ENCOUNTER — Emergency Department (HOSPITAL_COMMUNITY): Payer: Medicare HMO

## 2013-05-24 ENCOUNTER — Inpatient Hospital Stay (HOSPITAL_COMMUNITY)
Admission: EM | Admit: 2013-05-24 | Discharge: 2013-05-29 | DRG: 480 | Disposition: A | Discharge status: Skilled Nursing Facility | Payer: Medicare HMO | Attending: Internal Medicine | Admitting: Internal Medicine

## 2013-05-24 DIAGNOSIS — H9190 Unspecified hearing loss, unspecified ear: Secondary | ICD-10-CM | POA: Diagnosis present

## 2013-05-24 DIAGNOSIS — J4489 Other specified chronic obstructive pulmonary disease: Secondary | ICD-10-CM | POA: Diagnosis present

## 2013-05-24 DIAGNOSIS — F329 Major depressive disorder, single episode, unspecified: Secondary | ICD-10-CM | POA: Diagnosis present

## 2013-05-24 DIAGNOSIS — I10 Essential (primary) hypertension: Secondary | ICD-10-CM | POA: Diagnosis present

## 2013-05-24 DIAGNOSIS — Z96659 Presence of unspecified artificial knee joint: Secondary | ICD-10-CM

## 2013-05-24 DIAGNOSIS — Z87891 Personal history of nicotine dependence: Secondary | ICD-10-CM

## 2013-05-24 DIAGNOSIS — Z7982 Long term (current) use of aspirin: Secondary | ICD-10-CM

## 2013-05-24 DIAGNOSIS — K7030 Alcoholic cirrhosis of liver without ascites: Secondary | ICD-10-CM

## 2013-05-24 DIAGNOSIS — E039 Hypothyroidism, unspecified: Secondary | ICD-10-CM | POA: Diagnosis present

## 2013-05-24 DIAGNOSIS — Z96619 Presence of unspecified artificial shoulder joint: Secondary | ICD-10-CM

## 2013-05-24 DIAGNOSIS — R609 Edema, unspecified: Secondary | ICD-10-CM

## 2013-05-24 DIAGNOSIS — J1100 Influenza due to unidentified influenza virus with unspecified type of pneumonia: Secondary | ICD-10-CM

## 2013-05-24 DIAGNOSIS — K7031 Alcoholic cirrhosis of liver with ascites: Secondary | ICD-10-CM

## 2013-05-24 DIAGNOSIS — I4891 Unspecified atrial fibrillation: Secondary | ICD-10-CM | POA: Diagnosis present

## 2013-05-24 DIAGNOSIS — M1712 Unilateral primary osteoarthritis, left knee: Secondary | ICD-10-CM

## 2013-05-24 DIAGNOSIS — M7918 Myalgia, other site: Secondary | ICD-10-CM

## 2013-05-24 DIAGNOSIS — G8929 Other chronic pain: Secondary | ICD-10-CM

## 2013-05-24 DIAGNOSIS — F3289 Other specified depressive episodes: Secondary | ICD-10-CM | POA: Diagnosis present

## 2013-05-24 DIAGNOSIS — N400 Enlarged prostate without lower urinary tract symptoms: Secondary | ICD-10-CM

## 2013-05-24 DIAGNOSIS — IMO0002 Reserved for concepts with insufficient information to code with codable children: Secondary | ICD-10-CM

## 2013-05-24 DIAGNOSIS — S72143A Displaced intertrochanteric fracture of unspecified femur, initial encounter for closed fracture: Principal | ICD-10-CM

## 2013-05-24 DIAGNOSIS — S72142A Displaced intertrochanteric fracture of left femur, initial encounter for closed fracture: Secondary | ICD-10-CM | POA: Diagnosis present

## 2013-05-24 DIAGNOSIS — K7460 Unspecified cirrhosis of liver: Secondary | ICD-10-CM

## 2013-05-24 DIAGNOSIS — W010XXA Fall on same level from slipping, tripping and stumbling without subsequent striking against object, initial encounter: Secondary | ICD-10-CM | POA: Diagnosis present

## 2013-05-24 DIAGNOSIS — M19019 Primary osteoarthritis, unspecified shoulder: Secondary | ICD-10-CM

## 2013-05-24 DIAGNOSIS — K219 Gastro-esophageal reflux disease without esophagitis: Secondary | ICD-10-CM | POA: Diagnosis present

## 2013-05-24 DIAGNOSIS — K5900 Constipation, unspecified: Secondary | ICD-10-CM | POA: Diagnosis present

## 2013-05-24 DIAGNOSIS — M129 Arthropathy, unspecified: Secondary | ICD-10-CM | POA: Diagnosis present

## 2013-05-24 DIAGNOSIS — D649 Anemia, unspecified: Secondary | ICD-10-CM

## 2013-05-24 DIAGNOSIS — E43 Unspecified severe protein-calorie malnutrition: Secondary | ICD-10-CM | POA: Diagnosis present

## 2013-05-24 DIAGNOSIS — I2510 Atherosclerotic heart disease of native coronary artery without angina pectoris: Secondary | ICD-10-CM

## 2013-05-24 DIAGNOSIS — S72009A Fracture of unspecified part of neck of unspecified femur, initial encounter for closed fracture: Secondary | ICD-10-CM

## 2013-05-24 DIAGNOSIS — N138 Other obstructive and reflux uropathy: Secondary | ICD-10-CM | POA: Diagnosis present

## 2013-05-24 DIAGNOSIS — M1710 Unilateral primary osteoarthritis, unspecified knee: Secondary | ICD-10-CM

## 2013-05-24 DIAGNOSIS — D62 Acute posthemorrhagic anemia: Secondary | ICD-10-CM | POA: Diagnosis not present

## 2013-05-24 DIAGNOSIS — E785 Hyperlipidemia, unspecified: Secondary | ICD-10-CM | POA: Diagnosis present

## 2013-05-24 DIAGNOSIS — J449 Chronic obstructive pulmonary disease, unspecified: Secondary | ICD-10-CM | POA: Diagnosis present

## 2013-05-24 DIAGNOSIS — N401 Enlarged prostate with lower urinary tract symptoms: Secondary | ICD-10-CM | POA: Diagnosis present

## 2013-05-24 DIAGNOSIS — F411 Generalized anxiety disorder: Secondary | ICD-10-CM | POA: Diagnosis present

## 2013-05-24 DIAGNOSIS — K4090 Unilateral inguinal hernia, without obstruction or gangrene, not specified as recurrent: Secondary | ICD-10-CM

## 2013-05-24 HISTORY — DX: Displaced intertrochanteric fracture of left femur, initial encounter for closed fracture: S72.142A

## 2013-05-24 LAB — BASIC METABOLIC PANEL
BUN: 14 mg/dL (ref 6–23)
CO2: 23 meq/L (ref 19–32)
Calcium: 8.9 mg/dL (ref 8.4–10.5)
Chloride: 102 mEq/L (ref 96–112)
Creatinine, Ser: 0.77 mg/dL (ref 0.50–1.35)
GFR calc Af Amer: >90 mL/min (ref >90)
GFR calc non Af Amer: 79 mL/min — ABNORMAL LOW (ref >90)
Glucose, Bld: 103 mg/dL — ABNORMAL HIGH (ref 70–99)
POTASSIUM: 4.4 meq/L (ref 3.7–5.3)
SODIUM: 136 meq/L — AB (ref 137–147)

## 2013-05-24 LAB — CBC WITH DIFFERENTIAL/PLATELET
Basophils Absolute: 0 10*3/uL (ref 0.0–0.1)
Basophils Relative: 1 % (ref 0–1)
EOS ABS: 0.5 10*3/uL (ref 0.0–0.7)
EOS PCT: 8 % — AB (ref 0–5)
HCT: 32.3 % — ABNORMAL LOW (ref 39.0–52.0)
HEMOGLOBIN: 11.2 g/dL — AB (ref 13.0–17.0)
LYMPHS ABS: 1.4 10*3/uL (ref 0.7–4.0)
Lymphocytes Relative: 23 % (ref 12–46)
MCH: 31 pg (ref 26.0–34.0)
MCHC: 34.7 g/dL (ref 30.0–36.0)
MCV: 89.5 fL (ref 78.0–100.0)
MONOS PCT: 9 % (ref 3–12)
Monocytes Absolute: 0.6 10*3/uL (ref 0.1–1.0)
Neutro Abs: 3.6 10*3/uL (ref 1.7–7.7)
Neutrophils Relative %: 59 % (ref 43–77)
Platelets: 128 10*3/uL — ABNORMAL LOW (ref 150–400)
RBC: 3.61 MIL/uL — AB (ref 4.22–5.81)
RDW: 16.5 % — ABNORMAL HIGH (ref 11.5–15.5)
WBC: 6 10*3/uL (ref 4.0–10.5)

## 2013-05-24 LAB — PROTIME-INR
INR: 1.27 (ref 0.00–1.49)
Prothrombin Time: 15.6 seconds — ABNORMAL HIGH (ref 11.6–15.2)

## 2013-05-24 MED ORDER — GUAIFENESIN 100 MG/5ML PO SOLN
10.0000 mL | Freq: Four times a day (QID) | ORAL | Status: DC | PRN
  Filled 2013-05-24: qty 10

## 2013-05-24 MED ORDER — ASPIRIN 81 MG PO CHEW
81.0000 mg | CHEWABLE_TABLET | Freq: Every day | ORAL | Status: DC
  Administered 2013-05-24 – 2013-05-28 (×4): 81 mg via ORAL
  Filled 2013-05-24 (×6): qty 1

## 2013-05-24 MED ORDER — ENOXAPARIN SODIUM 40 MG/0.4ML ~~LOC~~ SOLN
40.0000 mg | Freq: Every day | SUBCUTANEOUS | Status: DC
  Administered 2013-05-24 – 2013-05-25 (×2): 40 mg via SUBCUTANEOUS
  Filled 2013-05-24 (×2): qty 0.4

## 2013-05-24 MED ORDER — HYDROMORPHONE HCL PF 1 MG/ML IJ SOLN: 0.5000 mg | INTRAMUSCULAR | Status: DC | PRN

## 2013-05-24 MED ORDER — HYDROCODONE-ACETAMINOPHEN 5-325 MG PO TABS
1.0000 | ORAL_TABLET | ORAL | Status: DC | PRN
  Administered 2013-05-24: 2 via ORAL
  Filled 2013-05-24: qty 2

## 2013-05-24 MED ORDER — SPIRONOLACTONE 25 MG PO TABS
25.0000 mg | ORAL_TABLET | ORAL | Status: DC
  Administered 2013-05-27: 25 mg via ORAL
  Filled 2013-05-24 (×2): qty 1

## 2013-05-24 MED ORDER — MIRTAZAPINE 30 MG PO TABS
30.0000 mg | ORAL_TABLET | Freq: Every day | ORAL | Status: DC
  Administered 2013-05-24 – 2013-05-28 (×5): 30 mg via ORAL
  Filled 2013-05-24 (×6): qty 1

## 2013-05-24 MED ORDER — SENNOSIDES-DOCUSATE SODIUM 8.6-50 MG PO TABS
1.0000 | ORAL_TABLET | Freq: Two times a day (BID) | ORAL | Status: DC
  Administered 2013-05-24 – 2013-05-29 (×7): 1 via ORAL
  Filled 2013-05-24 (×6): qty 1

## 2013-05-24 MED ORDER — LORAZEPAM 0.5 MG PO TABS
0.5000 mg | ORAL_TABLET | Freq: Two times a day (BID) | ORAL | Status: DC | PRN
  Administered 2013-05-25 – 2013-05-28 (×5): 0.5 mg via ORAL
  Filled 2013-05-24 (×5): qty 1

## 2013-05-24 MED ORDER — ACETAMINOPHEN 500 MG PO TABS
1000.0000 mg | ORAL_TABLET | Freq: Four times a day (QID) | ORAL | Status: DC | PRN
  Administered 2013-05-28 (×2): 1000 mg via ORAL
  Filled 2013-05-24 (×2): qty 2

## 2013-05-24 MED ORDER — POLYETHYLENE GLYCOL 3350 17 G PO PACK: 17.0000 g | PACK | Freq: Every day | ORAL | Status: DC | PRN

## 2013-05-24 MED ORDER — NITROGLYCERIN 0.4 MG SL SUBL: 0.4000 mg | SUBLINGUAL_TABLET | SUBLINGUAL | Status: DC | PRN

## 2013-05-24 MED ORDER — FINASTERIDE 5 MG PO TABS
5.0000 mg | ORAL_TABLET | Freq: Every day | ORAL | Status: DC
  Administered 2013-05-24 – 2013-05-28 (×5): 5 mg via ORAL
  Filled 2013-05-24 (×6): qty 1

## 2013-05-24 MED ORDER — MORPHINE SULFATE 4 MG/ML IJ SOLN
4.0000 mg | INTRAMUSCULAR | Status: AC | PRN
  Administered 2013-05-24 (×2): 4 mg via INTRAVENOUS
  Filled 2013-05-24 (×2): qty 1

## 2013-05-24 MED ORDER — LEVOTHYROXINE SODIUM 100 MCG PO TABS
100.0000 ug | ORAL_TABLET | Freq: Every day | ORAL | Status: DC
  Administered 2013-05-25 – 2013-05-29 (×5): 100 ug via ORAL
  Filled 2013-05-24 (×6): qty 1

## 2013-05-24 MED ORDER — PANTOPRAZOLE SODIUM 40 MG PO TBEC
40.0000 mg | DELAYED_RELEASE_TABLET | Freq: Every day | ORAL | Status: DC
  Administered 2013-05-24 – 2013-05-28 (×5): 40 mg via ORAL
  Filled 2013-05-24 (×6): qty 1

## 2013-05-24 MED ORDER — METOPROLOL TARTRATE 25 MG PO TABS
25.0000 mg | ORAL_TABLET | Freq: Two times a day (BID) | ORAL | Status: DC
  Administered 2013-05-24 – 2013-05-29 (×7): 25 mg via ORAL
  Filled 2013-05-24 (×11): qty 1

## 2013-05-24 MED ORDER — HYDRALAZINE HCL 20 MG/ML IJ SOLN: 5.0000 mg | Freq: Four times a day (QID) | INTRAMUSCULAR | Status: DC | PRN

## 2013-05-24 NOTE — ED Notes (Addendum)
Pt reports to the ED via [facility] following a fall from assisted home. Pt reporting some left hip pain. Pt attempted to kick his walker out of the way and he tripped and fell backwards. Denies any head injury or LOC. Pt denies any neck or back pain. Repositioning brought his pain from an 8/10 to a 4/10. External rotation and shortening noted. Some swelling and a slight deformity present. Hip is tender to palpation. CMS intact. Full ROM limited by pain. Pt A&Ox4, resp e/u, and skin warm and dry. Lip cracked and bleeding from dryness not injury.

## 2013-05-24 NOTE — H&P (Signed)
[facility] History and Physical  Raymond Phommachanh Sr. PJK:[id number] DOB: 1923/01/08 DOA: 05/24/2013  Referring physician: ED physician PCP: Tammi Sou, MD   Chief Complaint: left hip pain  HPI:  Pt is 80 yo male with COPD, resident of ALF, who presented to [facility] ED after an episode of fall at the facility where he resides. He fell on his left hip after trying to push his walker to the side. He denies any specific preceding symptoms such as chest pain or shortness of breath, no specific focal neurological symptoms, no dizziness, palpitations. He explains his hip pain is constant, throbbing, 7/10 in severity, worse with movement and with no specific alleviating factors.   In ED, pt is hemodynamically stable, on XRAY found to have sustained left hip fracture, Ortho team consulted and will plan on taking to OR in AM.  Assessment and Plan: Active Problems: Left hip fracture  - after what appears to be a mechanical fall - will admit to medical floor - keep NPO for now in an anticipation of surgery in AM - analgesia as needed but will change to PRN, pt is on scheduled narcotic regimen TID along with Ativan  HTN - will continue metoprolol per home medical regimen - hold Lasix as pt is clinically dry (he takes 40 mg QOD) - add Hydralazine as needed  Hypothyroidism - check TSH - continue synthroid per home medical regimen   Code Status: Full Family Communication: Pt at bedside Disposition Plan: Admit to medical floor   Review of Systems:  Constitutional: Negative for fever, chills. Negative for diaphoresis.  HENT: Negative for hearing loss, ear pain, nosebleeds, congestion, sore throat, neck pain, tinnitus and ear discharge.   Eyes: Negative for blurred vision, double vision, photophobia, pain, discharge and redness.  Respiratory: Negative for cough, hemoptysis, sputum production, shortness of breath, wheezing and stridor.   Cardiovascular: Negative for chest pain, palpitations,  orthopnea, claudication and leg swelling.  Gastrointestinal: Negative for nausea, vomiting and abdominal pain. Negative for heartburn, constipation, blood in stool and melena.  Genitourinary: Negative for dysuria, urgency, frequency, hematuria and flank pain.  Musculoskeletal: Negative for myalgias, back pain.  Skin: Negative for itching and rash.  Neurological: Negative for tingling, tremors, sensory change, speech change, focal weakness, loss of consciousness and headaches.  Endo/Heme/Allergies: Negative for environmental allergies and polydipsia. Does not bruise/bleed easily.  Psychiatric/Behavioral: Negative for suicidal ideas. The patient is not nervous/anxious.      Past Medical History  Diagnosis Date  . Arthritis   . Hyperlipidemia   . Hypertension   . A-fib     Dr Wynonia Lawman; WAS on Coumadin until 04/2012  . Angina     uses NTG prn  . Pneumonia     12 /2012  . Recurrent upper respiratory infection (URI)     03/2011  . Hypothyroidism     x 10 yrs  . GERD (gastroesophageal reflux disease)   . Constipation, chronic   . Depression   . History of dizziness   . Anxiety   . Urinary frequency   . Hearing loss of both ears     wears hearing aides  . BPH with obstruction/lower urinary tract symptoms     sees Dr Gaynelle Arabian  . Compression fracture of thoracic vertebra, non-traumatic 06/14/12    T6 and T7 (noted on CT angio chest to r/o PE)  . Alcoholic cirrhosis of liver with ascites 06/14/12    Noted on CT angio chest to r/o PE  . Esophageal varices in  alcoholic cirrhosis "   "    "   "    "    Past Surgical History  Procedure Laterality Date  . Total knee arthroplasty  1994    right  . Total shoulder replacement  1997    right  . Joint replacement      Rt knee and shoulder  . Hernia repair  1990    LIH repair  . Cholecystectomy  2004  . Inguinal hernia repair  05/09/2011    Procedure: HERNIA REPAIR INGUINAL ADULT;  Surgeon: Odis Hollingshead, MD;  Location: [place];   Service: General;  Laterality: Right;    Social History:  reports that he quit smoking about 29 years ago. His smoking use included Cigarettes. He has a 17.5 pack-year smoking history. His smokeless tobacco use includes Chew. He reports that he does not drink alcohol or use illicit drugs.  Allergies  Allergen Reactions  . Amitriptyline Other (See Comments)    Dizziness   . Beta Adrenergic Blockers Other (See Comments)    lethargy  . Imdur [Isosorbide]     Headache   . Niacin And Related Rash       . Sulfa Antibiotics Itching, Nausea And Vomiting and Rash    No family history of medical problems per pt   Prior to Admission medications   Medication Sig Start Date End Date Taking? Authorizing Provider  acetaminophen (TYLENOL) 500 MG tablet Take 1,000 mg by mouth every 6 (six) hours as needed (headache and/or minor discomfort). Contact physician if headache or pain persists longer than 24 hours   Yes Historical Provider, MD  aspirin 81 MG chewable tablet Chew 81 mg by mouth at bedtime.   Yes Historical Provider, MD  finasteride (PROSCAR) 5 MG tablet Take 5 mg by mouth at bedtime.  05/01/12  Yes Historical Provider, MD  furosemide (LASIX) 40 MG tablet Take 40 mg by mouth every other day.   Yes Historical Provider, MD  guaiFENesin (ROBITUSSIN) 100 MG/5ML SOLN Take 10 mLs by mouth every 6 (six) hours as needed for cough or to loosen phlegm. Do not exceed 4 doses in 24 hours.  If cough has not improved in 24 hours notify physicians   Yes Historical Provider, MD  HYDROcodone-acetaminophen (NORCO/VICODIN) 5-325 MG per tablet Take 1 tablet by mouth 3 (three) times daily. scheduled   Yes Historical Provider, MD  levothyroxine (SYNTHROID, LEVOTHROID) 100 MCG tablet Take 100 mcg by mouth daily.   Yes Historical Provider, MD  LORazepam (ATIVAN) 0.5 MG tablet Take 0.5 mg by mouth 2 (two) times daily as needed for anxiety or sleep.   Yes Historical Provider, MD  meclizine (ANTIVERT) 25 MG tablet Take  25 mg by mouth 2 (two) times daily as needed for dizziness.   Yes Historical Provider, MD  metoprolol tartrate (LOPRESSOR) 25 MG tablet Take 1 tablet (25 mg total) by mouth 2 (two) times daily. 12/04/12  Yes Tammi Sou, MD  mirtazapine (REMERON) 30 MG tablet Take 30 mg by mouth at bedtime.  01/04/11  Yes Historical Provider, MD  nitroGLYCERIN (NITROSTAT) 0.4 MG SL tablet Place 0.4 mg under the tongue every 5 (five) minutes as needed for chest pain.    Yes Historical Provider, MD  pantoprazole (PROTONIX) 40 MG tablet Take 40 mg by mouth at bedtime.  01/24/11  Yes Historical Provider, MD  polyethylene glycol (MIRALAX / GLYCOLAX) packet Take 17 g by mouth daily as needed (constipation). Mix in 8 oz of water and  drink   Yes Historical Provider, MD  spironolactone (ALDACTONE) 25 MG tablet Take 25 mg by mouth every other day.   Yes Historical Provider, MD  vitamin B-12 (CYANOCOBALAMIN) 1000 MCG tablet Take 1,000 mcg by mouth daily.   Yes Historical Provider, MD    Physical Exam: Filed Vitals:   05/24/13 2045 05/24/13 2100 05/24/13 2115 05/24/13 2130  BP: 175/70 163/80 168/75 175/83  Pulse: 72 73 75 78  Temp:      TempSrc:      Resp:      SpO2: 96% 97% 97% 97%    Physical Exam  Constitutional: Appears well-developed and well-nourished. No distress.  HENT: Normocephalic. External right and left ear normal. Dry MM Eyes: Conjunctivae and EOM are normal. PERRLA, no scleral icterus.  Neck: Normal ROM. Neck supple. No JVD. No tracheal deviation. No thyromegaly.  CVS: RRR, S1/S2 +, no murmurs, no gallops, no carotid bruit.  Pulmonary: Effort and breath sounds normal, no stridor, rhonchi, wheezes, rales.  Abdominal: Soft. BS +,  no distension, tenderness, rebound or guarding.  Musculoskeletal: Normal range of motion. TTP at the left hip area.  Lymphadenopathy: No lymphadenopathy noted, cervical, inguinal. Neuro: Alert. Normal reflexes, muscle tone coordination. No cranial nerve deficit. Skin:  Skin is warm and dry. No rash noted. Not diaphoretic. No erythema. No pallor.  Psychiatric: Normal mood and affect. Behavior, judgment, thought content normal.   Labs on Admission:  Basic Metabolic Panel:  Recent Labs Lab 05/24/13 1929  NA 136*  K 4.4  CL 102  CO2 23  GLUCOSE 103*  BUN 14  CREATININE 0.77  CALCIUM 8.9   CBC:  Recent Labs Lab 05/24/13 1929  WBC 6.0  NEUTROABS 3.6  HGB 11.2*  HCT 32.3*  MCV 89.5  PLT 128*    Radiological Exams on Admission: Dg Chest 1 View   05/24/2013   No active disease.   Dg Hip Complete Left   05/24/2013 Intertrochanteric left femur fracture   EKG: Normal sinus rhythm, no ST/T wave changes  Faye Ramsay, MD  [facility] Pager [phone]  If 7PM-7AM, please contact night-coverage [email or web address] Password [facility] 05/24/2013, 9:34 PM

## 2013-05-24 NOTE — ED Notes (Signed)
Odis Hollingshead, FIC, med tech from SNF where patient stays would like to be updated on the plan of care. Phone number [phone].

## 2013-05-24 NOTE — Consult Note (Signed)
PREOPERATIVE H&P  Chief Complaint: Left hip pain  HPI: Raymond Mchan Sr. is a 80 y.o. male who presents for preoperative history and physical with a diagnosis of left hip pain Symptoms are rated as moderate to severe, and have been worsening.  This is significantly impairing activities of daily living.  He has elected for surgical management. This occurred after a mechanical fall. Pain is better with rest, worse with movement. He describes it as severe. He denies any other injuries. He recently moved into assisted living facility, and his wife has Alzheimer's.  Past Medical History  Diagnosis Date  . Arthritis   . Hyperlipidemia   . Hypertension   . A-fib     Dr Wynonia Lawman; WAS on Coumadin until 04/2012  . Angina     uses NTG prn  . Pneumonia     12 /2012  . Recurrent upper respiratory infection (URI)     03/2011  . Hypothyroidism     x 10 yrs  . GERD (gastroesophageal reflux disease)   . Constipation, chronic   . Depression   . History of dizziness   . Anxiety   . Urinary frequency   . Hearing loss of both ears     wears hearing aides  . BPH with obstruction/lower urinary tract symptoms     sees Dr Gaynelle Arabian  . Compression fracture of thoracic vertebra, non-traumatic 06/14/12    T6 and T7 (noted on CT angio chest to r/o PE)  . Alcoholic cirrhosis of liver with ascites 06/14/12    Noted on CT angio chest to r/o PE  . Esophageal varices in alcoholic cirrhosis "   "    "   "    "   Past Surgical History  Procedure Laterality Date  . Total knee arthroplasty  1994    right  . Total shoulder replacement  1997    right  . Joint replacement      Rt knee and shoulder  . Hernia repair  1990    LIH repair  . Cholecystectomy  2004  . Inguinal hernia repair  05/09/2011    Procedure: HERNIA REPAIR INGUINAL ADULT;  Surgeon: Odis Hollingshead, MD;  Location: [place];  Service: General;  Laterality: Right;   History   Social History  . Marital Status: Married    Spouse Name: N/A     Number of Children: N/A  . Years of Education: N/A   Social History Main Topics  . Smoking status: Former Smoker -- 0.50 packs/day for 35 years    Types: Cigarettes    Quit date: 05/02/1984  . Smokeless tobacco: Current User    Types: Chew  . Alcohol Use: No  . Drug Use: No  . Sexual Activity: Not Currently   Other Topics Concern  . None   Social History Narrative   Lives with wife in [place].   Wife with dementia.   Worked all his life in [employer], did carpentry work on the side.  Served in the [employer] ([employer] in [place]   Hx of alcohol abuse up until his 37s.   Says he smoked "years ago" but "I never did inhale".   No chewing tobacco or dip.                     History reviewed. No pertinent family history. he denies family history of osteoporosis, father died at age 84 alcohol is him. Allergies  Allergen Reactions  . Amitriptyline Other (See Comments)  Dizziness   . Beta Adrenergic Blockers Other (See Comments)    lethargy  . Imdur [Isosorbide]     Headache   . Niacin And Related Rash       . Sulfa Antibiotics Itching, Nausea And Vomiting and Rash   Prior to Admission medications   Medication Sig Start Date End Date Taking? Authorizing Provider  acetaminophen (TYLENOL) 500 MG tablet Take 1,000 mg by mouth every 6 (six) hours as needed (headache and/or minor discomfort). Contact physician if headache or pain persists longer than 24 hours   Yes Historical Provider, MD  aspirin 81 MG chewable tablet Chew 81 mg by mouth at bedtime.   Yes Historical Provider, MD  finasteride (PROSCAR) 5 MG tablet Take 5 mg by mouth at bedtime.  05/01/12  Yes Historical Provider, MD  furosemide (LASIX) 40 MG tablet Take 40 mg by mouth every other day.   Yes Historical Provider, MD  guaiFENesin (ROBITUSSIN) 100 MG/5ML SOLN Take 10 mLs by mouth every 6 (six) hours as needed for cough or to loosen phlegm. Do not exceed 4 doses in 24 hours.  If cough has not improved in 24 hours notify  physicians   Yes Historical Provider, MD  HYDROcodone-acetaminophen (NORCO/VICODIN) 5-325 MG per tablet Take 1 tablet by mouth 3 (three) times daily. scheduled   Yes Historical Provider, MD  levothyroxine (SYNTHROID, LEVOTHROID) 100 MCG tablet Take 100 mcg by mouth daily.   Yes Historical Provider, MD  LORazepam (ATIVAN) 0.5 MG tablet Take 0.5 mg by mouth 2 (two) times daily as needed for anxiety or sleep.   Yes Historical Provider, MD  meclizine (ANTIVERT) 25 MG tablet Take 25 mg by mouth 2 (two) times daily as needed for dizziness.   Yes Historical Provider, MD  metoprolol tartrate (LOPRESSOR) 25 MG tablet Take 1 tablet (25 mg total) by mouth 2 (two) times daily. 12/04/12  Yes Tammi Sou, MD  mirtazapine (REMERON) 30 MG tablet Take 30 mg by mouth at bedtime.  01/04/11  Yes Historical Provider, MD  nitroGLYCERIN (NITROSTAT) 0.4 MG SL tablet Place 0.4 mg under the tongue every 5 (five) minutes as needed for chest pain.    Yes Historical Provider, MD  pantoprazole (PROTONIX) 40 MG tablet Take 40 mg by mouth at bedtime.  01/24/11  Yes Historical Provider, MD  polyethylene glycol (MIRALAX / GLYCOLAX) packet Take 17 g by mouth daily as needed (constipation). Mix in 8 oz of water and drink   Yes Historical Provider, MD  spironolactone (ALDACTONE) 25 MG tablet Take 25 mg by mouth every other day.   Yes Historical Provider, MD  vitamin B-12 (CYANOCOBALAMIN) 1000 MCG tablet Take 1,000 mcg by mouth daily.   Yes Historical Provider, MD     Positive ROS: All other systems have been reviewed and were otherwise negative with the exception of those mentioned in the HPI and as above.  Physical Exam: General: Alert, no acute distress, mildly frail. Cardiovascular: Mild bilateral pedal edema Respiratory: No cyanosis, no use of accessory musculature GI: No organomegaly, abdomen is soft and non-tender Skin: No lesions in the area of chief complaint with the exception of slight bruising on the left  side Neurologic: Sensation intact distally Psychiatric: Patient is competent for consent with normal mood and affect Lymphatic: No axillary or cervical lymphadenopathy  MUSCULOSKELETAL: Left hip has positive deformity, left leg is shortened and externally rotated. EHL and FHL are firing. Positive log roll.  Assessment: Left hip complex intertrochanteric hip fracture, with risk factors as  included above, including COPD, history of atrial fibrillation, history of angina, osteoporosis.  Plan: Plan for left hip intratrochanteric fixation with intramedullary nail.  The risks benefits and alternatives were discussed with the patient including but not limited to the risks of nonoperative treatment, versus surgical intervention including infection, bleeding, nerve injury, malunion, nonunion, the need for revision surgery, hardware prominence, hardware failure, the need for hardware removal, blood clots, cardiopulmonary complications, morbidity, mortality, among others, and they were willing to proceed.      Raymond Bridge, MD Cell [phone]   05/24/2013 10:30 PM

## 2013-05-24 NOTE — ED Provider Notes (Addendum)
CSN: [id number]     Arrival date & time 05/24/13  1858 History   First MD Initiated Contact with Patient 05/24/13 1901     Chief Complaint  Patient presents with  . Fall  . Hip Pain   The history is provided by the patient.   patient reports he slipped and fell and injured his left hip.  EMS was called and noted obvious deformity of his left hip with shortening and external rotation.  Patient denies head injury.  No loss consciousness.  No neck pain.  He denies weakness of his upper lower extremities.  No chest pain or shortness of breath.  No palpitations.  No recent illness.  The patient has no history of coronary artery disease, COPD/emphysema.  Patient is not on anticoagulation.  The patient resides at an assisted living Center.  Past Medical History  Diagnosis Date  . Arthritis   . Hyperlipidemia   . Hypertension   . A-fib     Dr Wynonia Lawman; WAS on Coumadin until 04/2012  . Angina     uses NTG prn  . Pneumonia     12 /2012  . Recurrent upper respiratory infection (URI)     03/2011  . Hypothyroidism     x 10 yrs  . GERD (gastroesophageal reflux disease)   . Constipation, chronic   . Depression   . History of dizziness   . Anxiety   . Urinary frequency   . Hearing loss of both ears     wears hearing aides  . BPH with obstruction/lower urinary tract symptoms     sees Dr Gaynelle Arabian  . Compression fracture of thoracic vertebra, non-traumatic 06/14/12    T6 and T7 (noted on CT angio chest to r/o PE)  . Alcoholic cirrhosis of liver with ascites 06/14/12    Noted on CT angio chest to r/o PE  . Esophageal varices in alcoholic cirrhosis "   "    "   "    "   Past Surgical History  Procedure Laterality Date  . Total knee arthroplasty  1994    right  . Total shoulder replacement  1997    right  . Joint replacement      Rt knee and shoulder  . Hernia repair  1990    LIH repair  . Cholecystectomy  2004  . Inguinal hernia repair  05/09/2011    Procedure: HERNIA REPAIR INGUINAL  ADULT;  Surgeon: Odis Hollingshead, MD;  Location: [facility];  Service: General;  Laterality: Right;   History reviewed. No pertinent family history. History  Substance Use Topics  . Smoking status: Former Smoker -- 0.50 packs/day for 35 years    Types: Cigarettes    Quit date: 05/02/1984  . Smokeless tobacco: Current User    Types: Chew  . Alcohol Use: No    Review of Systems  All other systems reviewed and are negative.    Allergies  Amitriptyline; Beta adrenergic blockers; Imdur; Niacin and related; and Sulfa antibiotics  Home Medications   Current Outpatient Rx  Name  Route  Sig  Dispense  Refill  . acetaminophen (TYLENOL) 500 MG tablet   Oral   Take 1,000 mg by mouth every 6 (six) hours as needed (headache and/or minor discomfort). Contact physician if headache or pain persists longer than 24 hours         . aspirin 81 MG chewable tablet   Oral   Chew 81 mg by mouth at bedtime.         Marland Kitchen  finasteride (PROSCAR) 5 MG tablet   Oral   Take 5 mg by mouth at bedtime.          . furosemide (LASIX) 40 MG tablet   Oral   Take 40 mg by mouth every other day.         Marland Kitchen guaiFENesin (ROBITUSSIN) 100 MG/5ML SOLN   Oral   Take 10 mLs by mouth every 6 (six) hours as needed for cough or to loosen phlegm. Do not exceed 4 doses in 24 hours.  If cough has not improved in 24 hours notify physicians         . HYDROcodone-acetaminophen (NORCO/VICODIN) 5-325 MG per tablet   Oral   Take 1 tablet by mouth 3 (three) times daily. scheduled         . levothyroxine (SYNTHROID, LEVOTHROID) 100 MCG tablet   Oral   Take 100 mcg by mouth daily.         Marland Kitchen LORazepam (ATIVAN) 0.5 MG tablet   Oral   Take 0.5 mg by mouth 2 (two) times daily as needed for anxiety or sleep.         . meclizine (ANTIVERT) 25 MG tablet   Oral   Take 25 mg by mouth 2 (two) times daily as needed for dizziness.         . metoprolol tartrate (LOPRESSOR) 25 MG tablet   Oral   Take 1 tablet (25 mg  total) by mouth 2 (two) times daily.   60 tablet   5   . mirtazapine (REMERON) 30 MG tablet   Oral   Take 30 mg by mouth at bedtime.          . nitroGLYCERIN (NITROSTAT) 0.4 MG SL tablet   Sublingual   Place 0.4 mg under the tongue every 5 (five) minutes as needed for chest pain.          . pantoprazole (PROTONIX) 40 MG tablet   Oral   Take 40 mg by mouth at bedtime.          . polyethylene glycol (MIRALAX / GLYCOLAX) packet   Oral   Take 17 g by mouth daily as needed (constipation). Mix in 8 oz of water and drink         . spironolactone (ALDACTONE) 25 MG tablet   Oral   Take 25 mg by mouth every other day.         . vitamin B-12 (CYANOCOBALAMIN) 1000 MCG tablet   Oral   Take 1,000 mcg by mouth daily.          BP 175/70  Pulse 72  Temp(Src) 97.7 F (36.5 C) (Oral)  Resp 13  SpO2 96% Physical Exam  Nursing note and vitals reviewed. Constitutional: He is oriented to person, place, and time. He appears well-developed and well-nourished.  HENT:  Head: Normocephalic and atraumatic.  Eyes: EOM are normal.  Neck: Normal range of motion.  C-spine nontender.  No cervical step-off.  C-spine cleared by Nexus criteria  Cardiovascular: Normal rate, regular rhythm, normal heart sounds and intact distal pulses.   Pulmonary/Chest: Effort normal and breath sounds normal. No respiratory distress.  Abdominal: Soft. He exhibits no distension. There is no tenderness.  Musculoskeletal: Normal range of motion.  Shortening and external rotation of the left lower extremity with pain with range of motion of the left hip.  Normal pulses in his left foot  Neurological: He is alert and oriented to person, place, and time.  Skin: Skin  is warm and dry.  Psychiatric: He has a normal mood and affect. Judgment normal.    ED Course  Procedures (including critical care time) Labs Review Labs Reviewed  BASIC METABOLIC PANEL - Abnormal; Notable for the following:    Sodium 136 (*)     Glucose, Bld 103 (*)    GFR calc non Af Amer 79 (*)    All other components within normal limits  CBC WITH DIFFERENTIAL - Abnormal; Notable for the following:    RBC 3.61 (*)    Hemoglobin 11.2 (*)    HCT 32.3 (*)    RDW 16.5 (*)    Platelets 128 (*)    Eosinophils Relative 8 (*)    All other components within normal limits  PROTIME-INR - Abnormal; Notable for the following:    Prothrombin Time 15.6 (*)    All other components within normal limits  TYPE AND SCREEN   Imaging Review Dg Chest 1 View  05/24/2013   CLINICAL DATA:  Fall, hip pain  EXAM: CHEST - 1 VIEW  COMPARISON:  03/07/2013  FINDINGS: Mild cardiac enlargement stable. Calcification of the aortic arch. Vascular pattern normal. Lungs clear.  IMPRESSION: No active disease.   Electronically Signed   By: Skipper Cliche M.D.   On: 05/24/2013 20:22   Dg Hip Complete Left  05/24/2013   CLINICAL DATA:  Fall, hip pain  EXAM: LEFT HIP - COMPLETE 2+ VIEW  COMPARISON:  None.  FINDINGS: There is a comminuted fracture of the left femur. Fracture line extends through the intertrochanteric region, with moderate varus angulation and displacement of the lesser trochanter medially.  IMPRESSION: Intertrochanteric left femur fracture   Electronically Signed   By: Skipper Cliche M.D.   On: 05/24/2013 20:22    EKG Interpretation    Date/Time:  Saturday May 24 2013 19:25:04 EST Ventricular Rate:  66 PR Interval:  218 QRS Duration: 105 QT Interval:  429 QTC Calculation: 449 R Axis:   -22 Text Interpretation:  Sinus rhythm Borderline prolonged PR interval Probable left ventricular hypertrophy No significant change was found Confirmed by Sierria Bruney  MD, Antonios Ostrow ([id number]) on 05/24/2013 7:43:50 PM            MDM   1. Intertrochanteric fracture of left hip    Patient appears to have a left hip fracture.  X-ray, pain medicine, n.p.o.  Hip pain protocol initiated  Ortho: Dr Mardelle Matte [facility]  I spoke with Dr. Mardelle Matte who suspects  that he will operate tomorrow.  Patient will need to be n.p.o. after midnight.  Hoy Morn, MD 05/24/13 2108  Hoy Morn, MD 05/24/13 2129

## 2013-05-25 ENCOUNTER — Encounter (HOSPITAL_COMMUNITY): Payer: Self-pay | Admitting: Anesthesiology

## 2013-05-25 ENCOUNTER — Inpatient Hospital Stay (HOSPITAL_COMMUNITY): Payer: Medicare HMO

## 2013-05-25 ENCOUNTER — Inpatient Hospital Stay (HOSPITAL_COMMUNITY): Payer: Medicare HMO | Admitting: Anesthesiology

## 2013-05-25 ENCOUNTER — Encounter (HOSPITAL_COMMUNITY)
Admission: EM | Disposition: A | Discharge status: Skilled Nursing Facility | Payer: Self-pay | Source: Home / Self Care | Attending: Internal Medicine

## 2013-05-25 ENCOUNTER — Encounter (HOSPITAL_COMMUNITY): Payer: Medicare HMO | Admitting: Anesthesiology

## 2013-05-25 DIAGNOSIS — E43 Unspecified severe protein-calorie malnutrition: Secondary | ICD-10-CM

## 2013-05-25 DIAGNOSIS — I10 Essential (primary) hypertension: Secondary | ICD-10-CM

## 2013-05-25 DIAGNOSIS — S72009A Fracture of unspecified part of neck of unspecified femur, initial encounter for closed fracture: Secondary | ICD-10-CM

## 2013-05-25 HISTORY — PX: FEMUR IM NAIL: SHX1597

## 2013-05-25 LAB — CBC
HEMATOCRIT: 29.3 % — AB (ref 39.0–52.0)
HEMOGLOBIN: 10 g/dL — AB (ref 13.0–17.0)
MCH: 30.6 pg (ref 26.0–34.0)
MCHC: 34.1 g/dL (ref 30.0–36.0)
MCV: 89.6 fL (ref 78.0–100.0)
Platelets: 133 10*3/uL — ABNORMAL LOW (ref 150–400)
RBC: 3.27 MIL/uL — ABNORMAL LOW (ref 4.22–5.81)
RDW: 16.4 % — AB (ref 11.5–15.5)
WBC: 9 10*3/uL (ref 4.0–10.5)

## 2013-05-25 LAB — SURGICAL PCR SCREEN
MRSA, PCR: NEGATIVE
STAPHYLOCOCCUS AUREUS: NEGATIVE

## 2013-05-25 LAB — POCT I-STAT 4, (NA,K, GLUC, HGB,HCT)
GLUCOSE: 86 mg/dL (ref 70–99)
HEMATOCRIT: 28 % — AB (ref 39.0–52.0)
Hemoglobin: 9.5 g/dL — ABNORMAL LOW (ref 13.0–17.0)
Potassium: 4.4 mEq/L (ref 3.7–5.3)
Sodium: 136 mEq/L — ABNORMAL LOW (ref 137–147)

## 2013-05-25 LAB — BASIC METABOLIC PANEL
BUN: 14 mg/dL (ref 6–23)
CHLORIDE: 105 meq/L (ref 96–112)
CO2: 23 mEq/L (ref 19–32)
Calcium: 8.6 mg/dL (ref 8.4–10.5)
Creatinine, Ser: 0.63 mg/dL (ref 0.50–1.35)
GFR calc non Af Amer: 85 mL/min — ABNORMAL LOW (ref >90)
Glucose, Bld: 92 mg/dL (ref 70–99)
Potassium: 4.7 mEq/L (ref 3.7–5.3)
Sodium: 138 mEq/L (ref 137–147)

## 2013-05-25 LAB — URINE MICROSCOPIC-ADD ON

## 2013-05-25 LAB — URINALYSIS, ROUTINE W REFLEX MICROSCOPIC
Glucose, UA: NEGATIVE mg/dL
Hgb urine dipstick: NEGATIVE
KETONES UR: NEGATIVE mg/dL
NITRITE: NEGATIVE
PH: 7 (ref 5.0–8.0)
Protein, ur: NEGATIVE mg/dL
Specific Gravity, Urine: 1.022 (ref 1.005–1.030)
UROBILINOGEN UA: 2 mg/dL — AB (ref 0.0–1.0)

## 2013-05-25 SURGERY — INSERTION, INTRAMEDULLARY ROD, FEMUR
Anesthesia: General | Site: Hip | Laterality: Left

## 2013-05-25 MED ORDER — ONDANSETRON HCL 4 MG/2ML IJ SOLN
INTRAMUSCULAR | Status: AC
  Filled 2013-05-25: qty 2

## 2013-05-25 MED ORDER — CEFAZOLIN SODIUM-DEXTROSE 2-3 GM-% IV SOLR
INTRAVENOUS | Status: AC
  Filled 2013-05-25: qty 50

## 2013-05-25 MED ORDER — FENTANYL CITRATE 0.05 MG/ML IJ SOLN
25.0000 ug | INTRAMUSCULAR | Status: DC | PRN
  Administered 2013-05-25: 50 ug via INTRAVENOUS

## 2013-05-25 MED ORDER — GLYCOPYRROLATE 0.2 MG/ML IJ SOLN
INTRAMUSCULAR | Status: AC
  Filled 2013-05-25: qty 2

## 2013-05-25 MED ORDER — SODIUM CHLORIDE 0.9 % IV SOLN
INTRAVENOUS | Status: DC | PRN
  Administered 2013-05-25: 16:00:00 via INTRAVENOUS

## 2013-05-25 MED ORDER — HYDROCODONE-ACETAMINOPHEN 5-325 MG PO TABS: 1.0000 | ORAL_TABLET | Freq: Four times a day (QID) | ORAL | Status: DC | PRN

## 2013-05-25 MED ORDER — GLYCOPYRROLATE 0.2 MG/ML IJ SOLN
INTRAMUSCULAR | Status: DC | PRN
  Administered 2013-05-25: 0.4 mg via INTRAVENOUS

## 2013-05-25 MED ORDER — ARTIFICIAL TEARS OP OINT
TOPICAL_OINTMENT | OPHTHALMIC | Status: DC | PRN
  Administered 2013-05-25: 1 via OPHTHALMIC

## 2013-05-25 MED ORDER — FENTANYL CITRATE 0.05 MG/ML IJ SOLN
INTRAMUSCULAR | Status: AC
  Filled 2013-05-25: qty 5

## 2013-05-25 MED ORDER — SENNA-DOCUSATE SODIUM 8.6-50 MG PO TABS: 2.0000 | ORAL_TABLET | Freq: Every day | ORAL | Status: DC

## 2013-05-25 MED ORDER — PHENYLEPHRINE 40 MCG/ML (10ML) SYRINGE FOR IV PUSH (FOR BLOOD PRESSURE SUPPORT)
PREFILLED_SYRINGE | INTRAVENOUS | Status: AC
  Filled 2013-05-25: qty 20

## 2013-05-25 MED ORDER — 0.9 % SODIUM CHLORIDE (POUR BTL) OPTIME
TOPICAL | Status: DC | PRN
  Administered 2013-05-25: 1000 mL

## 2013-05-25 MED ORDER — ONDANSETRON HCL 4 MG/2ML IJ SOLN
INTRAMUSCULAR | Status: DC | PRN
  Administered 2013-05-25: 4 mg via INTRAVENOUS

## 2013-05-25 MED ORDER — NEOSTIGMINE METHYLSULFATE 1 MG/ML IJ SOLN
INTRAMUSCULAR | Status: DC | PRN
  Administered 2013-05-25: 4 mg via INTRAVENOUS

## 2013-05-25 MED ORDER — PROPOFOL 10 MG/ML IV BOLUS
INTRAVENOUS | Status: DC | PRN
  Administered 2013-05-25: 140 mg via INTRAVENOUS

## 2013-05-25 MED ORDER — LACTATED RINGERS IV SOLN
INTRAVENOUS | Status: DC | PRN
  Administered 2013-05-25 (×2): via INTRAVENOUS

## 2013-05-25 MED ORDER — NEOSTIGMINE METHYLSULFATE 1 MG/ML IJ SOLN
INTRAMUSCULAR | Status: AC
  Filled 2013-05-25: qty 10

## 2013-05-25 MED ORDER — CEFAZOLIN SODIUM-DEXTROSE 2-3 GM-% IV SOLR
2.0000 g | INTRAVENOUS | Status: AC
  Administered 2013-05-25: 2 g via INTRAVENOUS
  Filled 2013-05-25: qty 50

## 2013-05-25 MED ORDER — ALBUMIN HUMAN 5 % IV SOLN
INTRAVENOUS | Status: DC | PRN
  Administered 2013-05-25: 16:00:00 via INTRAVENOUS

## 2013-05-25 MED ORDER — LIDOCAINE HCL (CARDIAC) 20 MG/ML IV SOLN
INTRAVENOUS | Status: DC | PRN
  Administered 2013-05-25: 60 mg via INTRAVENOUS

## 2013-05-25 MED ORDER — FENTANYL CITRATE 0.05 MG/ML IJ SOLN
INTRAMUSCULAR | Status: AC
  Filled 2013-05-25: qty 2

## 2013-05-25 MED ORDER — FENTANYL CITRATE 0.05 MG/ML IJ SOLN
INTRAMUSCULAR | Status: DC | PRN
  Administered 2013-05-25: 250 ug via INTRAVENOUS

## 2013-05-25 MED ORDER — ENOXAPARIN SODIUM 40 MG/0.4ML ~~LOC~~ SOLN: 40.0000 mg | SUBCUTANEOUS | Status: DC

## 2013-05-25 MED ORDER — ROCURONIUM BROMIDE 100 MG/10ML IV SOLN
INTRAVENOUS | Status: DC | PRN
  Administered 2013-05-25: 40 mg via INTRAVENOUS

## 2013-05-25 SURGICAL SUPPLY — 49 items
APL SKNCLS STERI-STRIP NONHPOA (GAUZE/BANDAGES/DRESSINGS) ×1
BENZOIN TINCTURE PRP APPL 2/3 (GAUZE/BANDAGES/DRESSINGS) ×3 IMPLANT
BIT DRILL 4.0X195MM (BIT) IMPLANT
BIT DRILL 60/10 CANN QC (BIT) ×1 IMPLANT
BIT DRILL 60/10MM CANN QC (BIT) ×1
BIT DRILL CANN LRG QC 17.0X300 (BIT) ×2 IMPLANT
BLADE HELICAL 110 (Orthopedic Implant) ×1 IMPLANT
BLADE HELICAL 110MM (Orthopedic Implant) ×1 IMPLANT
BOOTCOVER CLEANROOM LRG (PROTECTIVE WEAR) ×2 IMPLANT
CLOSURE STERI-STRIP 1/2X4 (GAUZE/BANDAGES/DRESSINGS)
CLOSURE WOUND 1/2 X4 (GAUZE/BANDAGES/DRESSINGS) ×1
CLOTH BEACON ORANGE TIMEOUT ST (SAFETY) ×1 IMPLANT
CLSR STERI-STRIP ANTIMIC 1/2X4 (GAUZE/BANDAGES/DRESSINGS) ×1 IMPLANT
COVER SURGICAL LIGHT HANDLE (MISCELLANEOUS) ×5 IMPLANT
DRAPE STERI IOBAN 125X83 (DRAPES) ×3 IMPLANT
DRILL BIT 4.0X195MM (BIT) ×2
DRSG MEPILEX BORDER 4X4 (GAUZE/BANDAGES/DRESSINGS) ×9 IMPLANT
DRSG MEPILEX BORDER 4X8 (GAUZE/BANDAGES/DRESSINGS) ×2 IMPLANT
DURAPREP 26ML APPLICATOR (WOUND CARE) ×3 IMPLANT
ELECT CAUTERY BLADE 6.4 (BLADE) ×5 IMPLANT
ELECT REM PT RETURN 9FT ADLT (ELECTROSURGICAL) ×3
ELECTRODE REM PT RTRN 9FT ADLT (ELECTROSURGICAL) ×1 IMPLANT
EVACUATOR 1/8 PVC DRAIN (DRAIN) IMPLANT
FACESHIELD LNG OPTICON STERILE (SAFETY) ×2 IMPLANT
GAUZE XEROFORM 5X9 LF (GAUZE/BANDAGES/DRESSINGS) ×1 IMPLANT
GLOVE BIOGEL PI ORTHO PRO SZ8 (GLOVE) ×4
GLOVE ORTHO TXT STRL SZ7.5 (GLOVE) ×10 IMPLANT
GLOVE PI ORTHO PRO STRL SZ8 (GLOVE) ×1 IMPLANT
GLOVE SURG ORTHO 8.0 STRL STRW (GLOVE) ×10 IMPLANT
GOWN PREVENTION PLUS XLARGE (GOWN DISPOSABLE) ×2 IMPLANT
GOWN STRL NON-REIN LRG LVL3 (GOWN DISPOSABLE) ×6 IMPLANT
GUIDEWIRE 3.2X400 (WIRE) ×4 IMPLANT
KIT ROOM TURNOVER OR (KITS) ×3 IMPLANT
MANIFOLD NEPTUNE II (INSTRUMENTS) ×1 IMPLANT
NAIL TROCH 11X130 380MM (Nail) ×2 IMPLANT
NS IRRIG 1000ML POUR BTL (IV SOLUTION) ×3 IMPLANT
PACK GENERAL/GYN (CUSTOM PROCEDURE TRAY) ×5 IMPLANT
PAD ARMBOARD 7.5X6 YLW CONV (MISCELLANEOUS) ×10 IMPLANT
SCREW LOCKING IM 5.0MX44M (Screw) ×2 IMPLANT
STAPLER VISISTAT 35W (STAPLE) ×3 IMPLANT
STRIP CLOSURE SKIN 1/2X4 (GAUZE/BANDAGES/DRESSINGS) ×1 IMPLANT
SUT VIC AB 0 CTB1 27 (SUTURE) ×5 IMPLANT
SUT VIC AB 2-0 FS1 27 (SUTURE) ×5 IMPLANT
SUT VIC AB 2-0 SH 27 (SUTURE)
SUT VIC AB 2-0 SH 27XBRD (SUTURE) IMPLANT
SUT VIC AB 3-0 SH 8-18 (SUTURE) ×5 IMPLANT
TOWEL OR 17X24 6PK STRL BLUE (TOWEL DISPOSABLE) ×5 IMPLANT
TOWEL OR 17X26 10 PK STRL BLUE (TOWEL DISPOSABLE) ×5 IMPLANT
WATER STERILE IRR 1000ML POUR (IV SOLUTION) ×1 IMPLANT

## 2013-05-25 NOTE — Progress Notes (Signed)
Orthopedic Tech Progress Note Patient Details:  Raymond Carton Sr. 10-11-1922 [id number]  Patient ID: Raymond Morrow Sr., male   DOB: 01/12/1923, 80 y.o.   MRN: [id number] rn stated that pt is unable to use ohf   Raymond Larsen 05/25/2013, 7:09 PM

## 2013-05-25 NOTE — Progress Notes (Addendum)
[facility] PROGRESS NOTE Interim History: 80 yo male with COPD, resident of ALF, who presented to [facility] ED after an episode of fall at the facility where he resides. He fell on his left hip after trying to push his walker to the side.  In ED, pt is hemodynamically stable, on XRAY found to have sustained left hip fracture    Assessment/Plan:  Left hip fracture  - due to  mechanical fall  - NPO for now in an anticipation of surgery in AM  - Pt is on scheduled narcotic regimen TID along with Ativan. - INR <1.5, PLT's > 100k.  HTN  - will continue metoprolol per home medical regimen  - add Hydralazine as needed. - cont to hold lasix.  Hypothyroidism  - check TSH  - continue synthroid per home medical regimen   Severe protein caloric malnutrition: - ensure TID.   History of A.fib: - NSR.  Code Status: Full  Family Communication: Pt at bedside  Disposition Plan: Admit to medical floor     Consultants:  ortho  Procedures:  ORIF 2.1.2015  Antibiotics:  None  HPI/Subjective: Pain controlled.  Objective: Filed Vitals:   05/24/13 2145 05/24/13 2200 05/24/13 2218 05/25/13 0617  BP: 153/69 163/82 177/88 118/70  Pulse: 78 78 81 74  Temp:   98.4 F (36.9 C) 98 F (36.7 C)  TempSrc:   Oral Oral  Resp: 18   18  SpO2: 97% 97% 97% 94%    Intake/Output Summary (Last 24 hours) at 05/25/13 0814 Last data filed at 05/25/13 3664  Gross per 24 hour  Intake      0 ml  Output    600 ml  Net   -600 ml   There were no vitals filed for this visit.  Exam:  General: Alert, awake, oriented x3, in no acute distress. cachectic HEENT: No bruits, no goiter.  Heart: Regular rate and rhythm, without murmurs, rubs, gallops.  Lungs: Good air movement, clear Abdomen: Soft, nontender, nondistended, positive bowel sounds.     Data Reviewed: Basic Metabolic Panel:  Recent Labs Lab 05/24/13 1929 05/25/13 0432  NA 136* 138  K 4.4 4.7  CL 102 105  CO2 23 23   GLUCOSE 103* 92  BUN 14 14  CREATININE 0.77 0.63  CALCIUM 8.9 8.6   Liver Function Tests: No results found for this basename: AST, ALT, ALKPHOS, BILITOT, PROT, ALBUMIN,  in the last 168 hours No results found for this basename: LIPASE, AMYLASE,  in the last 168 hours No results found for this basename: AMMONIA,  in the last 168 hours CBC:  Recent Labs Lab 05/24/13 1929 05/25/13 0432  WBC 6.0 9.0  NEUTROABS 3.6  --   HGB 11.2* 10.0*  HCT 32.3* 29.3*  MCV 89.5 89.6  PLT 128* 133*   Cardiac Enzymes: No results found for this basename: CKTOTAL, CKMB, CKMBINDEX, TROPONINI,  in the last 168 hours BNP (last 3 results) No results found for this basename: PROBNP,  in the last 8760 hours CBG: No results found for this basename: GLUCAP,  in the last 168 hours  Recent Results (from the past 240 hour(s))  SURGICAL PCR SCREEN     Status: None   Collection Time    05/25/13 12:42 AM      Result Value Range Status   MRSA, PCR NEGATIVE  NEGATIVE Final   Staphylococcus aureus NEGATIVE  NEGATIVE Final   Comment:            The  Xpert SA Assay (FDA     approved for NASAL specimens     in patients over 74 years of age),     is one component of     a comprehensive surveillance     program.  Test performance has     been validated by [employer] for patients greater     than or equal to 4 year old.     It is not intended     to diagnose infection nor to     guide or monitor treatment.     Studies: Dg Chest 1 View  05/24/2013   CLINICAL DATA:  Fall, hip pain  EXAM: CHEST - 1 VIEW  COMPARISON:  03/07/2013  FINDINGS: Mild cardiac enlargement stable. Calcification of the aortic arch. Vascular pattern normal. Lungs clear.  IMPRESSION: No active disease.   Electronically Signed   By: Skipper Cliche M.D.   On: 05/24/2013 20:22   Dg Hip Complete Left  05/24/2013   CLINICAL DATA:  Fall, hip pain  EXAM: LEFT HIP - COMPLETE 2+ VIEW  COMPARISON:  None.  FINDINGS: There is a comminuted  fracture of the left femur. Fracture line extends through the intertrochanteric region, with moderate varus angulation and displacement of the lesser trochanter medially.  IMPRESSION: Intertrochanteric left femur fracture   Electronically Signed   By: Skipper Cliche M.D.   On: 05/24/2013 20:22    Scheduled Meds: . aspirin  81 mg Oral QHS  . enoxaparin (LOVENOX) injection  40 mg Subcutaneous QHS  . finasteride  5 mg Oral QHS  . levothyroxine  100 mcg Oral QAC breakfast  . metoprolol tartrate  25 mg Oral BID  . mirtazapine  30 mg Oral QHS  . pantoprazole  40 mg Oral QHS  . senna-docusate  1 tablet Oral BID  . spironolactone  25 mg Oral QODAY   Continuous Infusions:    Charlynne Cousins  [facility] Pager [phone]. If 8PM-8AM, please contact night-coverage at [email or web address], password [facility] 05/25/2013, 8:14 AM  LOS: 1 day

## 2013-05-25 NOTE — Progress Notes (Signed)
Patient has 2 gold tone bands and 1 pair of eyeglasses in envelope [id number].  Envelope taken to security and items placed in safe.  Upper dentures are in room at the bedside.

## 2013-05-25 NOTE — Transfer of Care (Signed)
Immediate Anesthesia Transfer of Care Note  Patient: Raymond Morrow Sr.  Procedure(s) Performed: Procedure(s): INTRAMEDULLARY (IM) NAIL FEMORAL (Left)  Patient Location: PACU  Anesthesia Type:General  Level of Consciousness: oriented, patient cooperative and responds to stimulation  Airway & Oxygen Therapy: Patient Spontanous Breathing and Patient connected to face mask oxygen  Post-op Assessment: Report given to PACU RN, Post -op Vital signs reviewed and stable, Patient moving all extremities and Patient moving all extremities X 4  Post vital signs: Reviewed and stable  Complications: No apparent anesthesia complications

## 2013-05-25 NOTE — Anesthesia Preprocedure Evaluation (Addendum)
Anesthesia Evaluation   Patient awake    Airway Mallampati: II      Dental  (+) Dental Advidsory Given   Pulmonary pneumonia -, Recent URI , former smoker,          Cardiovascular hypertension, + angina + CAD     Neuro/Psych    GI/Hepatic GERD-  ,ETOH   Endo/Other  Hypothyroidism   Renal/GU negative Renal ROS     Musculoskeletal   Abdominal   Peds  Hematology  (+) anemia ,   Anesthesia Other Findings   Reproductive/Obstetrics                         Anesthesia Physical Anesthesia Plan  ASA: III  Anesthesia Plan: General   Post-op Pain Management:    Induction: Intravenous  Airway Management Planned: Oral ETT  Additional Equipment:   Intra-op Plan:   Post-operative Plan: Possible Post-op intubation/ventilation  Informed Consent: I have reviewed the patients History and Physical, chart, labs and discussed the procedure including the risks, benefits and alternatives for the proposed anesthesia with the patient or authorized representative who has indicated his/her understanding and acceptance.   Dental advisory given and Dental Advisory Given  Plan Discussed with: CRNA and Anesthesiologist  Anesthesia Plan Comments:        Anesthesia Quick Evaluation

## 2013-05-25 NOTE — Op Note (Signed)
DATE OF SURGERY:  05/25/2013  TIME: 4:03 PM  PATIENT NAME:  Raymond Morrow Sr.  AGE: 80 y.o.  PRE-OPERATIVE DIAGNOSIS:  left intertrochanteric hip fracture  POST-OPERATIVE DIAGNOSIS:  SAME  PROCEDURE:  INTRAMEDULLARY (IM) NAIL FEMORAL  SURGEON:  Latham Kinzler P  ASSISTANT:  Joya Gaskins, OPA-C, present and scrubbed throughout the case, critical for assistance with exposure, retraction, instrumentation, and closure.  OPERATIVE IMPLANTS: Synthes trochanteric femoral nail with interlocking helical blade into the femoral head and a single distal interlocking bolt.  Estimated blood loss: 500 cc  PREOPERATIVE INDICATIONS:  Raymond Falwell Sr. is a 80 y.o. year old who fell and suffered a hip fracture. He was brought into the ER and then admitted and optimized and then elected for surgical intervention.    The risks benefits and alternatives were discussed with the patient including but not limited to the risks of nonoperative treatment, versus surgical intervention including infection, bleeding, nerve injury, malunion, nonunion, hardware prominence, hardware failure, need for hardware removal, blood clots, cardiopulmonary complications, morbidity, mortality, among others, and they were willing to proceed.  He did receive a dose of Lovenox on the day of surgery, as ordered by the medical service.  OPERATIVE PROCEDURE:  The patient was brought to the operating room and placed in the supine position. General anesthesia was administered, with a foley. He was placed on the fracture table.  Closed reduction was performed under C-arm guidance. The length of the femur was also measured using fluoroscopy. Time out was then performed after sterile prep and drape. He received preoperative antibiotics.  Incision was made proximal to the greater trochanter. A guidewire was placed in the appropriate position. Confirmation was made on AP and lateral views. The above-named nail was opened. I opened the proximal femur  with a reamer. I then placed the nail by hand easily down. I did not need to ream the femur. He was bleeding fairly significantly throughout the case. Total operative time was ~30 minutes.  Once the nail was completely seated, I placed a guidepin into the femoral head into the center center position. I measured the length, and then reamed the lateral cortex and up into the head. I then placed the helical blade. Slight compression was applied. Anatomic fixation achieved. Bone quality was mediocre.  I then secured the proximal interlocking bolt, and took off a half a turn, and then removed the instruments, and then locked the nail distally with perfect circles technique.  I took final C-arm pictures AP and lateral the entire length of the leg. Anatomic reconstruction was achieved, and the wounds were irrigated copiously and closed with Vicryl followed by Steri-Strips and sterile gauze for the skin. The patient was awakened and returned to PACU in stable and satisfactory condition. There no complications and the patient tolerated the procedure well.  He will be weightbearing as tolerated, and will be on Lovenox  for a period of three weeks after discharge.   Marchia Bond, M.D.

## 2013-05-25 NOTE — Progress Notes (Signed)
The patient has been re-examined, and the chart reviewed, and there have been no interval changes to the documented history and physical.    The risks, benefits, and alternatives have been discussed at length, and the patient is willing to proceed.    Nyzier Boivin P, MD  

## 2013-05-25 NOTE — Progress Notes (Signed)
Patient's daughter was at the bedside and I explained hospital policy in regards to personal items ie jewelry that need to be removed and either taken home or secured.  Daughter removed patient's brown leather band watch from his left wrist but later I noticed that his gold tone double band ring was still on his finger (left hand). Daughter left for the night. He also has his upper dentures and eye glasses at the bedside.  If daughter is not present in the morning for his procedure, will have ring sent to security with proper documentation.

## 2013-05-25 NOTE — Preoperative (Signed)
Beta Blockers   Reason not to administer Beta Blockers:Not Applicable 

## 2013-05-25 NOTE — Discharge Instructions (Signed)
Diet: As you were doing prior to hospitalization  ° °Shower:  May shower but keep the wounds dry, use an occlusive plastic wrap, NO SOAKING IN TUB.  If the bandage gets wet, change with a clean dry gauze. ° °Dressing:  You may change your dressing 3-5 days after surgery.  Then change the dressing daily with sterile gauze dressing.   ° °There are sticky tapes (steri-strips) on your wounds and all the stitches are absorbable.  Leave the steri-strips in place when changing your dressings, they will peel off with time, usually 2-3 weeks. ° °Activity:  Increase activity slowly as tolerated, but follow the weight bearing instructions below.  No lifting or driving for 6 weeks. ° °Weight Bearing:   As tolerated.   ° °To prevent constipation: you may use a stool softener such as - ° °Colace (over the counter) 100 mg by mouth twice a day  °Drink plenty of fluids (prune juice may be helpful) and high fiber foods °Miralax (over the counter) for constipation as needed.   ° °Itching:  If you experience itching with your medications, try taking only a single pain pill, or even half a pain pill at a time.  You may take up to 10 pain pills per day, and you can also use benadryl over the counter for itching or also to help with sleep.  ° °Precautions:  If you experience chest pain or shortness of breath - call 911 immediately for transfer to the hospital emergency department!! ° °If you develop a fever greater that 101 F, purulent drainage from wound, increased redness or drainage from wound, or calf pain -- Call the office at [phone]                                                °Follow- Up Appointment:  Please call for an appointment to be seen in 2 weeks [place] - [phone] ° ° ° ° ° °

## 2013-05-25 NOTE — Anesthesia Postprocedure Evaluation (Signed)
  Anesthesia Post-op Note  Patient: Raymond Larsen.  Procedure(s) Performed: Procedure(s): INTRAMEDULLARY (IM) NAIL FEMORAL (Left)  Patient Location: PACU  Anesthesia Type:General  Level of Consciousness: awake  Airway and Oxygen Therapy: Patient Spontanous Breathing  Post-op Pain: mild  Post-op Assessment: Post-op Vital signs reviewed  Post-op Vital Signs: Reviewed  Complications: No apparent anesthesia complications

## 2013-05-25 NOTE — Anesthesia Procedure Notes (Signed)
Procedure Name: Intubation Date/Time: 05/25/2013 2:54 PM Performed by: Jacquiline Doe A Pre-anesthesia Checklist: Patient identified, Timeout performed, Emergency Drugs available, Suction available and Patient being monitored Patient Re-evaluated:Patient Re-evaluated prior to inductionOxygen Delivery Method: Circle system utilized Preoxygenation: Pre-oxygenation with 100% oxygen Intubation Type: IV induction and Cricoid Pressure applied Ventilation: Mask ventilation without difficulty Laryngoscope Size: Mac and 3 Grade View: Grade I Tube type: Oral Tube size: 7.5 mm Number of attempts: 1 Airway Equipment and Method: Stylet Placement Confirmation: ETT inserted through vocal cords under direct vision,  breath sounds checked- equal and bilateral and positive ETCO2 Secured at: 23 cm Tube secured with: Tape Dental Injury: Teeth and Oropharynx as per pre-operative assessment

## 2013-05-26 ENCOUNTER — Inpatient Hospital Stay (HOSPITAL_COMMUNITY): Payer: Medicare HMO

## 2013-05-26 DIAGNOSIS — N400 Enlarged prostate without lower urinary tract symptoms: Secondary | ICD-10-CM

## 2013-05-26 DIAGNOSIS — I4891 Unspecified atrial fibrillation: Secondary | ICD-10-CM

## 2013-05-26 LAB — CBC
HCT: 28.3 % — ABNORMAL LOW (ref 39.0–52.0)
Hemoglobin: 9.7 g/dL — ABNORMAL LOW (ref 13.0–17.0)
MCH: 29.7 pg (ref 26.0–34.0)
MCHC: 34.3 g/dL (ref 30.0–36.0)
MCV: 86.5 fL (ref 78.0–100.0)
Platelets: 163 10*3/uL (ref 150–400)
RBC: 3.27 MIL/uL — AB (ref 4.22–5.81)
RDW: 17.5 % — ABNORMAL HIGH (ref 11.5–15.5)
WBC: 14.8 10*3/uL — AB (ref 4.0–10.5)

## 2013-05-26 LAB — BASIC METABOLIC PANEL
BUN: 22 mg/dL (ref 6–23)
CO2: 17 meq/L — AB (ref 19–32)
Calcium: 8 mg/dL — ABNORMAL LOW (ref 8.4–10.5)
Chloride: 107 mEq/L (ref 96–112)
Creatinine, Ser: 0.89 mg/dL (ref 0.50–1.35)
GFR calc Af Amer: 86 mL/min — ABNORMAL LOW (ref >90)
GFR calc non Af Amer: 74 mL/min — ABNORMAL LOW (ref >90)
GLUCOSE: 88 mg/dL (ref 70–99)
POTASSIUM: 5.3 meq/L (ref 3.7–5.3)
Sodium: 139 mEq/L (ref 137–147)

## 2013-05-26 LAB — BLOOD PRODUCT ORDER (VERBAL) VERIFICATION

## 2013-05-26 LAB — VITAMIN D 25 HYDROXY (VIT D DEFICIENCY, FRACTURES): Vit D, 25-Hydroxy: 24 ng/mL — ABNORMAL LOW (ref 30–89)

## 2013-05-26 LAB — URINE CULTURE
CULTURE: NO GROWTH
Colony Count: NO GROWTH

## 2013-05-26 MED ORDER — MAGNESIUM CITRATE PO SOLN: 1.0000 | Freq: Once | ORAL | Status: AC | PRN

## 2013-05-26 MED ORDER — ONDANSETRON HCL 4 MG/2ML IJ SOLN: 4.0000 mg | Freq: Four times a day (QID) | INTRAMUSCULAR | Status: DC | PRN

## 2013-05-26 MED ORDER — PHENOL 1.4 % MT LIQD
1.0000 | OROMUCOSAL | Status: DC | PRN
Start: 2013-05-26 — End: 2013-05-29

## 2013-05-26 MED ORDER — POTASSIUM CHLORIDE IN NACL 20-0.45 MEQ/L-% IV SOLN
INTRAVENOUS | Status: DC
  Administered 2013-05-27 – 2013-05-28 (×2): via INTRAVENOUS
  Filled 2013-05-26 (×6): qty 1000

## 2013-05-26 MED ORDER — MORPHINE SULFATE 2 MG/ML IJ SOLN: 0.5000 mg | INTRAMUSCULAR | Status: DC | PRN

## 2013-05-26 MED ORDER — METOCLOPRAMIDE HCL 10 MG PO TABS: 5.0000 mg | ORAL_TABLET | Freq: Three times a day (TID) | ORAL | Status: DC | PRN

## 2013-05-26 MED ORDER — ENOXAPARIN SODIUM 40 MG/0.4ML ~~LOC~~ SOLN
40.0000 mg | SUBCUTANEOUS | Status: DC
  Administered 2013-05-26 – 2013-05-29 (×4): 40 mg via SUBCUTANEOUS
  Filled 2013-05-26 (×6): qty 0.4

## 2013-05-26 MED ORDER — ENSURE COMPLETE PO LIQD
237.0000 mL | Freq: Three times a day (TID) | ORAL | Status: DC
  Administered 2013-05-26 – 2013-05-29 (×6): 237 mL via ORAL

## 2013-05-26 MED ORDER — ONDANSETRON HCL 4 MG PO TABS
4.0000 mg | ORAL_TABLET | Freq: Four times a day (QID) | ORAL | Status: DC | PRN
Start: 2013-05-26 — End: 2013-05-29

## 2013-05-26 MED ORDER — HYDROCODONE-ACETAMINOPHEN 5-325 MG PO TABS: 1.0000 | ORAL_TABLET | Freq: Four times a day (QID) | ORAL | Status: DC | PRN

## 2013-05-26 MED ORDER — MENTHOL 3 MG MT LOZG: 1.0000 | LOZENGE | OROMUCOSAL | Status: DC | PRN

## 2013-05-26 MED ORDER — BISACODYL 10 MG RE SUPP: 10.0000 mg | Freq: Every day | RECTAL | Status: DC | PRN

## 2013-05-26 MED ORDER — ALUM & MAG HYDROXIDE-SIMETH 200-200-20 MG/5ML PO SUSP: 30.0000 mL | ORAL | Status: DC | PRN

## 2013-05-26 MED ORDER — METOCLOPRAMIDE HCL 5 MG/ML IJ SOLN: 5.0000 mg | Freq: Three times a day (TID) | INTRAMUSCULAR | Status: DC | PRN

## 2013-05-26 MED ORDER — CEFAZOLIN SODIUM-DEXTROSE 2-3 GM-% IV SOLR
2.0000 g | Freq: Four times a day (QID) | INTRAVENOUS | Status: AC
  Administered 2013-05-26: 2 g via INTRAVENOUS
  Filled 2013-05-26 (×3): qty 50

## 2013-05-26 NOTE — Progress Notes (Signed)
Orthopedic Tech Progress Note Patient Details:  [name] January 16, 1923 [id number] Family refused overhead frame Patient ID: Raymond Larsen Sr., male   DOB: 10/13/1922, 80 y.o.   MRN: [id number]   Braulio Bosch 05/26/2013, 7:14 PM

## 2013-05-26 NOTE — Progress Notes (Signed)
Foley removed on 2/2 at 0800. Bladder scan performed at 1300, found 144ml, no discomfort. Bladder scan performed at 1500, found 282ml, no discomfort. Pt attempted to void, no output. Bladder scan performed at 1700, found 235ml, no discomfort. MD paged at 1721. MD made aware of situation at 1740. No new orders at this time. Will continue to encourage fluids, monitor and assess patient.

## 2013-05-26 NOTE — Progress Notes (Signed)
     Subjective:  Patient reports pain as mild.  No chest pain or SOB.  Objective:   VITALS:   Filed Vitals:   05/25/13 2050 05/26/13 0055 05/26/13 0604 05/26/13 0749  BP: 101/49 100/50 111/52   Pulse: 102 123 104   Temp: 98.8 F (37.1 C)  99.1 F (37.3 C)   TempSrc: Oral  Oral   Resp: 18  19 16   SpO2: 97%  97% 96%    Neurologically intact Dorsiflexion/Plantar flexion intact Incision: moderate drainage Proximal dressing partially saturated.  Lab Results  Component Value Date   WBC 14.8* 05/26/2013   HGB 9.7* 05/26/2013   HCT 28.3* 05/26/2013   MCV 86.5 05/26/2013   PLT 163 05/26/2013     Assessment/Plan: 1 Day Post-Op   Principal Problem:   Intertrochanteric fracture of left hip Active Problems:   Protein-calorie malnutrition, severe   Advance diet Up with therapy Discharge to SNF Dressing change tomorrow.  Doing well otherwise.  ABLA improved with 2 units given yesterday after surgery.   Alessander Sikorski P 05/26/2013, 11:54 AM   Marchia Bond, MD Cell [phone]

## 2013-05-26 NOTE — Progress Notes (Signed)
INITIAL NUTRITION ASSESSMENT  DOCUMENTATION CODES Per approved criteria  -Not Applicable   INTERVENTION: - Ensure Complete po TID, each supplement provides 350 kcal and 13 grams of protein - Diet will be downgraded to Dysphagia III per pt request for foods to be cut up - RD contacted regarding feeding assist per pt request  NUTRITION DIAGNOSIS: Inadequate oral intake related to feeding difficulty as evidenced by poor meal completion.   Goal: Pt to meet >/= 90% of their estimated nutrition needs   Monitor:  Wt, po intake, labs, acceptance of supplements  Reason for Assessment: Consult  80 y.o. male  Admitting Dx: Intertrochanteric fracture of left hip  ASSESSMENT: - 80 yo male with COPD, resident of ALF, who presented to [facility] ED after an episode of fall at the facility where he resides. He fell on his left hip after trying to push his walker to the side.  - Underwent surgical repair 2/1 - Pt reports that he is having problems with "his arms giving out" and says that he is having a hard time chewing due to missing teeth - Pt has had no recent wt loss, and says that he feeds self at ALF  Height: Ht Readings from Last 1 Encounters:  05/15/13 5\' 7"  (1.702 m)    Weight: Wt Readings from Last 1 Encounters:  05/15/13 155 lb (70.308 kg)    Ideal Body Weight: 61.6 kg  % Ideal Body Weight: 114%  Wt Readings from Last 10 Encounters:  05/15/13 155 lb (70.308 kg)  05/09/13 155 lb (70.308 kg)  04/30/13 152 lb (68.947 kg)  03/04/13 136 lb 3.9 oz (61.8 kg)  01/27/13 141 lb (63.957 kg)  01/22/13 138 lb 8 oz (62.823 kg)  12/04/12 146 lb (66.225 kg)  11/20/12 137 lb (62.143 kg)  11/11/12 154 lb (69.854 kg)  09/12/12 135 lb 4 oz (61.349 kg)    Usual Body Weight: 152 lbs  % Usual Body Weight: 98%  BMI:  There is no weight on file to calculate BMI.  Estimated Nutritional Needs: Kcal: 3875-6433 Protein: 85-95 g Fluid: 1.8-2.1 L/day  Skin: incision on left hip  Diet  Order: General  EDUCATION NEEDS: -no education needs identified at this time   Intake/Output Summary (Last 24 hours) at 05/26/13 1244 Last data filed at 05/26/13 0900  Gross per 24 hour  Intake   2820 ml  Output   1250 ml  Net   1570 ml    Last BM: none recorded   Labs:   Recent Labs Lab 05/24/13 1929 05/25/13 0432 05/25/13 1543 05/26/13 0540  NA 136* 138 136* 139  K 4.4 4.7 4.4 5.3  CL 102 105  --  107  CO2 23 23  --  17*  BUN 14 14  --  22  CREATININE 0.77 0.63  --  0.89  CALCIUM 8.9 8.6  --  8.0*  GLUCOSE 103* 92 86 88    CBG (last 3)  No results found for this basename: GLUCAP,  in the last 72 hours  Scheduled Meds: . aspirin  81 mg Oral QHS  .  ceFAZolin (ANCEF) IV  2 g Intravenous Q6H  . enoxaparin (LOVENOX) injection  40 mg Subcutaneous Q24H  . finasteride  5 mg Oral QHS  . levothyroxine  100 mcg Oral QAC breakfast  . metoprolol tartrate  25 mg Oral BID  . mirtazapine  30 mg Oral QHS  . pantoprazole  40 mg Oral QHS  . senna-docusate  1 tablet Oral  BID  . spironolactone  25 mg Oral QODAY    Continuous Infusions: . 0.45 % NaCl with KCl 20 mEq / L      Past Medical History  Diagnosis Date  . Arthritis   . Hyperlipidemia   . Hypertension   . A-fib     Dr Wynonia Lawman; WAS on Coumadin until 04/2012  . Angina     uses NTG prn  . Pneumonia     12 /2012  . Recurrent upper respiratory infection (URI)     03/2011  . Hypothyroidism     x 10 yrs  . GERD (gastroesophageal reflux disease)   . Constipation, chronic   . Depression   . History of dizziness   . Anxiety   . Urinary frequency   . Hearing loss of both ears     wears hearing aides  . BPH with obstruction/lower urinary tract symptoms     sees Dr Gaynelle Arabian  . Compression fracture of thoracic vertebra, non-traumatic 06/14/12    T6 and T7 (noted on CT angio chest to r/o PE)  . Alcoholic cirrhosis of liver with ascites 06/14/12    Noted on CT angio chest to r/o PE  . Esophageal varices in  alcoholic cirrhosis "   "    "   "    "  . Intertrochanteric fracture of left hip 05/24/2013    Past Surgical History  Procedure Laterality Date  . Total knee arthroplasty  1994    right  . Total shoulder replacement  1997    right  . Joint replacement      Rt knee and shoulder  . Hernia repair  1990    LIH repair  . Cholecystectomy  2004  . Inguinal hernia repair  05/09/2011    Procedure: HERNIA REPAIR INGUINAL ADULT;  Surgeon: Odis Hollingshead, MD;  Location: [facility];  Service: General;  Laterality: Right;    Terrace Arabia RD, LDN

## 2013-05-26 NOTE — Progress Notes (Signed)
Clinical Social Work Department BRIEF PSYCHOSOCIAL ASSESSMENT 05/26/2013  Patient:  Raymond Larsen, Raymond Larsen     Account Number:  [id number]     Admit date:  05/24/2013  Clinical Social Worker:  Adair Laundry  Date/Time:  05/26/2013 03:00 PM  Referred by:  Physician  Date Referred:  05/26/2013 Referred for  SNF Placement   Other Referral:   Interview type:  Patient Other interview type:   Spoke with pt and pt daughter at bedside    PSYCHOSOCIAL DATA Living Status:  FACILITY Admitted from facility:   Level of care:  Assisted Living Primary support name:  Delsa Sale [phone] Primary support relationship to patient:  CHILD, ADULT Degree of support available:   Pt has very supportive family    CURRENT CONCERNS Current Concerns  Post-Acute Placement   Other Concerns:    SOCIAL WORK ASSESSMENT / PLAN CSW spoke with pt and pt daughter about PT recommendation. Pt daughter was vaguely aware that pt would likely need ST rehab at dc. Pt has been to facility in the past and was very dissatisfied therefore pt is hesitant about going to SNF again. Pt also is from ALF where his wife who has dementia also resides. CSW along with pt daughter explained to pt beneif of ST rehab at facility. Pt is agreeable but would not like to return to [place]. Pt and pt daughter would like for pt to be faxed out to [facility]. At this time, they have no preference for facility.    CSW contacted [employer] to inform of PT recommendation and pt likely discharge tomorrow. They are reviewing pt for appropriateness.   Assessment/plan status:  Psychosocial Support/Ongoing Assessment of Needs Other assessment/ plan:   Information/referral to community resources:   SNF list to be provided with bed offers    PATIENT'S/FAMILY'S RESPONSE TO PLAN OF CARE: Pt and pt daughter agreeable to ST rehab at [facility] before returning to ALF.       [place], [place]

## 2013-05-26 NOTE — Progress Notes (Signed)
[facility] PROGRESS NOTE  Raymond Shiller Sr. DPO:[id number] DOB: [birth year] DOA: 05/24/2013 PCP: Tammi Sou, MD  Brief narrative: 80 yo male with COPD, resident of ALF, who presented to [facility] ED 05/24/2013 status post mechanical fall while ambulating with the walker. Pt was found to have left hip fracture  Assessment/Plan:   Principal Problem: Left hip fracture  - secondary to mechanical fall  - no significant complaints of pain this am; sitting in recliner - pain regimen with morphine 0.5 mg IV every 2 hours PRN and norco PO every 6 hours PRN  - plan for SNF discharge, SW assisting discharge plan - antibiotics per ortho team Active Problems: HTN  - BP is 111/52 this am - continue metoprolol 25 mg PO BID and spironolactone daily Hypothyroidism  - continue synthroid 100 mcg daily Severe protein caloric malnutrition:  - ensure TID.  History of A.fib:  - in normal sinus rhythm based on 12 lead EKG 05/25/2013, reviewed EKG personally - rate control with metoprolol 25 mg PO BID  Code Status: Full  Family Communication: Pt at bedside  Disposition Plan: to SNF likely in next 24 hours; SW assisting discharge plan  Consultants:  ortho Procedures:  ORIF 2.1.2015 Antibiotics:  Cefazolin  Leisa Lenz, MD  [facility] Pager [phone]  If 7PM-7AM, please contact night-coverage [email or web address] Password [email or web address] 05/26/2013, 1:34 PM   LOS: 2 days    HPI/Subjective: No acute overnight events.   Objective: Filed Vitals:   05/26/13 0055 05/26/13 0604 05/26/13 0749 05/26/13 1200  BP: 100/50 111/52    Pulse: 123 104    Temp:  99.1 F (37.3 C)    TempSrc:  Oral    Resp:  19 16 16   SpO2:  97% 96% 98%    Intake/Output Summary (Last 24 hours) at 05/26/13 1334 Last data filed at 05/26/13 0900  Gross per 24 hour  Intake   2820 ml  Output   1250 ml  Net   1570 ml    Exam:   General:  Pt is alert, follows commands appropriately, not in acute  distress  Cardiovascular: Regular rate and rhythm, S1/S2 appreciated  Respiratory: Clear to auscultation bilaterally, no wheezing  Abdomen: Soft, non tender, non distended, bowel sounds present, no guarding  Extremities: No edema, pulses DP and PT palpable bilaterally  Neuro: Grossly nonfocal  Data Reviewed: Basic Metabolic Panel:  Recent Labs Lab 05/24/13 1929 05/25/13 0432 05/25/13 1543 05/26/13 0540  NA 136* 138 136* 139  K 4.4 4.7 4.4 5.3  CL 102 105  --  107  CO2 23 23  --  17*  GLUCOSE 103* 92 86 88  BUN 14 14  --  22  CREATININE 0.77 0.63  --  0.89  CALCIUM 8.9 8.6  --  8.0*   Liver Function Tests: No results found for this basename: AST, ALT, ALKPHOS, BILITOT, PROT, ALBUMIN,  in the last 168 hours No results found for this basename: LIPASE, AMYLASE,  in the last 168 hours No results found for this basename: AMMONIA,  in the last 168 hours CBC:  Recent Labs Lab 05/24/13 1929 05/25/13 0432 05/25/13 1543 05/26/13 0540  WBC 6.0 9.0  --  14.8*  NEUTROABS 3.6  --   --   --   HGB 11.2* 10.0* 9.5* 9.7*  HCT 32.3* 29.3* 28.0* 28.3*  MCV 89.5 89.6  --  86.5  PLT 128* 133*  --  163   Cardiac Enzymes: No results found for this basename: CKTOTAL, CKMB, CKMBINDEX,  TROPONINI,  in the last 168 hours BNP: No components found with this basename: POCBNP,  CBG: No results found for this basename: GLUCAP,  in the last 168 hours  Recent Results (from the past 240 hour(s))  SURGICAL PCR SCREEN     Status: None   Collection Time    05/25/13 12:42 AM      Result Value Range Status   MRSA, PCR NEGATIVE  NEGATIVE Final   Staphylococcus aureus NEGATIVE  NEGATIVE Final   Comment:            The Xpert SA Assay (FDA     approved for NASAL specimens     in patients over 38 years of age),     is one component of     a comprehensive surveillance     program.  Test performance has     been validated by [employer] for patients greater     than or equal to 1 year old.      It is not intended     to diagnose infection nor to     guide or monitor treatment.  URINE CULTURE     Status: None   Collection Time    05/25/13  1:06 AM      Result Value Range Status   Specimen Description URINE, CATHETERIZED   Final   Special Requests NONE   Final   Culture  Setup Time     Final   Value: 05/25/2013 14:38     Performed at [facility]     Final   Value: NO GROWTH     Performed at [facility]   Culture     Final   Value: NO GROWTH     Performed at [facility]   Report Status 05/26/2013 FINAL   Final     Studies: Dg Chest 1 View  05/24/2013   CLINICAL DATA:  Fall, hip pain  EXAM: CHEST - 1 VIEW  COMPARISON:  03/07/2013  FINDINGS: Mild cardiac enlargement stable. Calcification of the aortic arch. Vascular pattern normal. Lungs clear.  IMPRESSION: No active disease.   Electronically Signed   By: Skipper Cliche M.D.   On: 05/24/2013 20:22   Dg Hip Complete Left  05/24/2013   CLINICAL DATA:  Fall, hip pain  EXAM: LEFT HIP - COMPLETE 2+ VIEW  COMPARISON:  None.  FINDINGS: There is a comminuted fracture of the left femur. Fracture line extends through the intertrochanteric region, with moderate varus angulation and displacement of the lesser trochanter medially.  IMPRESSION: Intertrochanteric left femur fracture   Electronically Signed   By: Skipper Cliche M.D.   On: 05/24/2013 20:22   Dg Femur Left  05/25/2013   CLINICAL DATA:  Left proximal femur pinning  EXAM: LEFT FEMUR - 2 VIEW; DG C-ARM 1-60 MIN  COMPARISON:  None  FLUOROSCOPY TIME:  27 second  FINDINGS: Five intraoperative fluoroscopic spot images of provided. There has been interval placement of a left femoral intra medullary nail transfixing a comminuted left intertrochanteric fracture which is in near anatomic alignment. There is a interlocking cannulated femoral neck screw. There is no hardware failure or complication. There is no dislocation.  IMPRESSION: ORIF left  intertrochanteric fracture.   Electronically Signed   By: Kathreen Devoid   On: 05/25/2013 16:35   Dg Pelvis Portable  05/26/2013   CLINICAL DATA:  Hip fracture.  EXAM: PORTABLE PELVIS 1-2 VIEWS  COMPARISON:  Left hip series 2 04/2013.  FINDINGS: Patient is status post open reduction internal fixation of left hip fracture with good anatomic alignment. There is a displaced lesser trochanter fracture. Diffuse osteopenia and degenerative change present. Calcifications in pelvis consistent with phleboliths.  IMPRESSION: Patient status post open reduction internal fixation left hip fracture with good alignment on AP view. There is a displaced lesser trochanter fracture.   Electronically Signed   By: Marcello Moores  Register   On: 05/26/2013 07:13   Dg Hip Portable 1 View Left  05/26/2013   CLINICAL DATA:  Left hip fracture.  EXAM: PORTABLE LEFT HIP - 1 VIEW  COMPARISON:  Hip series 2 04/2013.  FINDINGS: Cross-table portable lateral view left hip reveals good anatomic alignment status post open reduction internal fixation of left hip fracture .  IMPRESSION: Good anatomic alignment  left hip fracture status post ORIF.   Electronically Signed   By: Marcello Moores  Register   On: 05/26/2013 07:19   Dg C-arm 1-60 Min  05/25/2013   CLINICAL DATA:  Left proximal femur pinning  EXAM: LEFT FEMUR - 2 VIEW; DG C-ARM 1-60 MIN  COMPARISON:  None  FLUOROSCOPY TIME:  27 second  FINDINGS: Five intraoperative fluoroscopic spot images of provided. There has been interval placement of a left femoral intra medullary nail transfixing a comminuted left intertrochanteric fracture which is in near anatomic alignment. There is a interlocking cannulated femoral neck screw. There is no hardware failure or complication. There is no dislocation.  IMPRESSION: ORIF left intertrochanteric fracture.   Electronically Signed   By: Kathreen Devoid   On: 05/25/2013 16:35    Scheduled Meds: . aspirin  81 mg Oral QHS  .  ceFAZolin (ANCEF) IV  2 g Intravenous Q6H  .  enoxaparin (LOVENOX) injection  40 mg Subcutaneous Q24H  . feeding supplement (ENSURE COMPLETE)  237 mL Oral TID BM  . finasteride  5 mg Oral QHS  . levothyroxine  100 mcg Oral QAC breakfast  . metoprolol tartrate  25 mg Oral BID  . mirtazapine  30 mg Oral QHS  . pantoprazole  40 mg Oral QHS  . senna-docusate  1 tablet Oral BID  . spironolactone  25 mg Oral QODAY   Continuous Infusions: . 0.45 % NaCl with KCl 20 mEq / L

## 2013-05-26 NOTE — Progress Notes (Signed)
Utilization review completed.  

## 2013-05-26 NOTE — Evaluation (Signed)
Physical Therapy Evaluation Patient Details Name: Raymond Flanery Sr. MRN: [id number] DOB: 22-Nov-1922 Today's Date: 05/26/2013 Time: 7829-5621 PT Time Calculation (min): 23 min  PT Assessment / Plan / Recommendation History of Present Illness  Pt is an 80 y/o male admitted s/p fall at the facility where he resides after pushing walker to the side. Pt sustained a left femur fracture and is now s/p IM nailing and WBAT.  Clinical Impression  This patient presents with acute pain and decreased functional independence following the above mentioned procedure. At the time of PT eval, pt had difficulty with bed mobility and transfers. +2 assist for safety required. This patient is appropriate for skilled PT interventions to address functional limitations, improve safety and independence with functional mobility, and return to PLOF.     PT Assessment  Patient needs continued PT services    Follow Up Recommendations  SNF    Does the patient have the potential to tolerate intense rehabilitation      Barriers to Discharge        Equipment Recommendations  Other (comment) (TBD by next venue of care)    Recommendations for Other Services     Frequency Min 3X/week    Precautions / Restrictions Precautions Precautions: Fall Restrictions Weight Bearing Restrictions: Yes LLE Weight Bearing: Weight bearing as tolerated   Pertinent Vitals/Pain Pt does not rate pain on 0-10 scale, however states his LLE is very painful while performing transfers.       Mobility  Bed Mobility Overal bed mobility: Needs Assistance Bed Mobility: Supine to Sit Supine to sit: Mod assist General bed mobility comments: VC's for sequencing and technique. Assist for movement and support of LLE, as well as for trunk elevation to full sitting on EOB.  Transfers Overall transfer level: Needs assistance Equipment used: Rolling walker (2 wheeled) Transfers: Sit to/from [facility] Sit to Stand: Min  assist;+2 physical assistance Stand pivot transfers: Mod assist;+2 physical assistance General transfer comment: Pt with increased pain with any weight bearing and could not support himself with the RW. Pt was guided around to the recliner as pt began to sit and recliner was pulled up behind him for a safe descent to chair.     Exercises General Exercises - Lower Extremity Ankle Circles/Pumps: 10 reps Quad Sets: 10 reps Hip ABduction/ADduction: 10 reps   PT Diagnosis: Difficulty walking;Acute pain  PT Problem List: Decreased strength;Decreased range of motion;Decreased activity tolerance;Decreased balance;Decreased mobility;Decreased knowledge of use of DME;Decreased safety awareness;Pain PT Treatment Interventions: DME instruction;Gait training;Stair training;Functional mobility training;Therapeutic activities;Therapeutic exercise;Neuromuscular re-education;Patient/family education     PT Goals(Current goals can be found in the care plan section) Acute Rehab PT Goals Patient Stated Goal: To return to ALF PT Goal Formulation: With patient Time For Goal Achievement: 06/09/13 Potential to Achieve Goals: Fair  Visit Information  Last PT Received On: 05/26/13 Assistance Needed: +2 History of Present Illness: Pt is an 80 y/o male admitted s/p fall at the facility where he resides after pushing walker to the side. Pt sustained a left femur fracture and is now s/p IM nailing and WBAT.       Prior Functioning  Home Living Family/patient expects to be discharged to:: Assisted living Home Equipment: [name];Walker - 4 wheels;Cane - single point;Bedside commode;Shower seat Additional Comments: From previous entry Prior Function Level of Independence: Needs assistance Gait / Transfers Assistance Needed: Used a walker all the time ADL's / Homemaking Assistance Needed: Assist for bathing and dressing Communication Communication:  HOH Dominant Hand: Right    Cognition   Cognition Arousal/Alertness: Awake/alert Behavior During Therapy: WFL for tasks assessed/performed Overall Cognitive Status: No family/caregiver present to determine baseline cognitive functioning    Extremity/Trunk Assessment Upper Extremity Assessment Upper Extremity Assessment: Generalized weakness Lower Extremity Assessment Lower Extremity Assessment: LLE deficits/detail LLE Deficits / Details: Decreased strength and AROM consistent with IM nailing LLE: Unable to fully assess due to pain Cervical / Trunk Assessment Cervical / Trunk Assessment: Kyphotic   Balance Balance Overall balance assessment: Needs assistance Sitting-balance support: Feet supported;Bilateral upper extremity supported Sitting balance-Leahy Scale: Fair Postural control: Posterior lean;Right lateral lean Standing balance support: Bilateral upper extremity supported Standing balance-Leahy Scale: Poor  End of Session PT - End of Session Equipment Utilized During Treatment: Gait belt Activity Tolerance: Patient limited by pain Patient left: in chair;with call bell/phone within reach Nurse Communication: Mobility status  GP     Jolyn Lent 05/26/2013, 2:43 PM  Jolyn Lent, [place], DPT [phone]

## 2013-05-26 NOTE — Care Management Note (Signed)
CARE MANAGEMENT NOTE 05/26/2013  Patient:  Raymond Larsen, Raymond Larsen   Account Number:  [id number]  Date Initiated:  05/26/2013  Documentation initiated by:  Ricki Miller  Subjective/Objective Assessment:   80 yr old male s/p left hip IM Nailing     Action/Plan:   Patient is for shortterm rehab at [facility].  Social worker is aware.   Anticipated DC Date:  05/27/2013   Anticipated DC Plan:  SKILLED NURSING FACILITY  In-house referral  Clinical Social Worker      DC Planning Services  CM consult      [facility] Choice  NA   Choice offered to / List presented to:             Status of service:  Completed, signed off Discharge Disposition:  [facility]

## 2013-05-26 NOTE — Progress Notes (Addendum)
Clinical Social Work Department CLINICAL SOCIAL WORK PLACEMENT NOTE 05/26/2013  Patient:  Raymond Larsen, Raymond Larsen  Account Number:  [id number] Admit date:  05/24/2013  Clinical Social Worker:  Berton Mount, Latanya Presser  Date/time:  05/26/2013 04:00 PM  Clinical Social Work is seeking post-discharge placement for this patient at the following level of care:   [place]   (*CSW will update this form in Epic as items are completed)   05/26/2013  Patient/family provided with [place] Department of Clinical Social Work's list of facilities offering this level of care within the geographic area requested by the patient (or if unable, by the patient's family).  05/26/2013  Patient/family informed of their freedom to choose among providers that offer the needed level of care, that participate in Medicare, Medicaid or managed care program needed by the patient, have an available bed and are willing to accept the patient.  05/26/2013  Patient/family informed of [facility]' ownership interest in [facility], as well as of the fact that they are under no obligation to receive care at this facility.  PASARR submitted to EDS on Existing PASARR number received from EDS on   FL2 transmitted to all facilities in geographic area requested by pt/family on  05/26/2013 FL2 transmitted to all facilities within larger geographic area on   Patient informed that his/her managed care company has contracts with or will negotiate with  certain facilities, including the following:     Patient/family informed of bed offers received: 05/27/2013   Patient chooses bed at  Physician recommends and patient chooses bed at    Patient to be transferred to  on   Patient to be transferred to facility by   The following physician request were entered in Epic:   Additional Comments:  [place], [place]

## 2013-05-27 DIAGNOSIS — D62 Acute posthemorrhagic anemia: Secondary | ICD-10-CM | POA: Diagnosis not present

## 2013-05-27 HISTORY — DX: Acute posthemorrhagic anemia: D62

## 2013-05-27 LAB — CBC
HCT: 21.6 % — ABNORMAL LOW (ref 39.0–52.0)
HEMOGLOBIN: 7.6 g/dL — AB (ref 13.0–17.0)
MCH: 30.5 pg (ref 26.0–34.0)
MCHC: 35.2 g/dL (ref 30.0–36.0)
MCV: 86.7 fL (ref 78.0–100.0)
Platelets: 168 10*3/uL (ref 150–400)
RBC: 2.49 MIL/uL — ABNORMAL LOW (ref 4.22–5.81)
RDW: 17.2 % — ABNORMAL HIGH (ref 11.5–15.5)
WBC: 13.3 10*3/uL — ABNORMAL HIGH (ref 4.0–10.5)

## 2013-05-27 LAB — PREPARE RBC (CROSSMATCH)

## 2013-05-27 LAB — BASIC METABOLIC PANEL
BUN: 34 mg/dL — AB (ref 6–23)
CO2: 21 mEq/L (ref 19–32)
CREATININE: 0.89 mg/dL (ref 0.50–1.35)
Calcium: 7.7 mg/dL — ABNORMAL LOW (ref 8.4–10.5)
Chloride: 104 mEq/L (ref 96–112)
GFR calc Af Amer: 86 mL/min — ABNORMAL LOW (ref >90)
GFR, EST NON AFRICAN AMERICAN: 74 mL/min — AB (ref >90)
GLUCOSE: 125 mg/dL — AB (ref 70–99)
POTASSIUM: 5 meq/L (ref 3.7–5.3)
Sodium: 134 mEq/L — ABNORMAL LOW (ref 137–147)

## 2013-05-27 MED ORDER — ACETAMINOPHEN 325 MG PO TABS
650.0000 mg | ORAL_TABLET | Freq: Once | ORAL | Status: AC
  Administered 2013-05-27: 650 mg via ORAL
  Filled 2013-05-27: qty 2

## 2013-05-27 MED ORDER — FUROSEMIDE 10 MG/ML IJ SOLN
20.0000 mg | Freq: Once | INTRAMUSCULAR | Status: AC
  Administered 2013-05-27: 20 mg via INTRAVENOUS
  Filled 2013-05-27: qty 2

## 2013-05-27 NOTE — Progress Notes (Signed)
     Subjective:  Patient reports pain as mild.  Otherwise doing well.  Slow with PT.  Objective:   VITALS:   Filed Vitals:   05/26/13 2159 05/27/13 0626 05/27/13 0800 05/27/13 1200  BP: 118/43 125/46    Pulse: 97 92    Temp: 99 F (37.2 C) 98.4 F (36.9 C)    TempSrc:      Resp: 18 18 16 18   Height:      Weight:      SpO2: 96% 99% 98% 98%    Neurologically intact Dorsiflexion/Plantar flexion intact Incision: dressing C/D/I New dressing applied to proximal wound.  No cellulitis.  Lab Results  Component Value Date   WBC 13.3* 05/27/2013   HGB 7.6* 05/27/2013   HCT 21.6* 05/27/2013   MCV 86.7 05/27/2013   PLT 168 05/27/2013     Assessment/Plan: 2 Days Post-Op   Principal Problem:   Intertrochanteric fracture of left hip Active Problems:   Protein-calorie malnutrition, severe   ABLA, would benefit from 2 units prbcs.  Will order if not already done. Possible dc tomorrow if ok with primary team.   Glenda Kunst P 05/27/2013, 2:14 PM   Marchia Bond, MD Cell [phone]

## 2013-05-27 NOTE — Progress Notes (Signed)
[facility] PROGRESS NOTE  Raymond Markovitz Sr. [id number] DOB: April 26, 1922 DOA: 05/24/2013 PCP: Tammi Sou, MD  Brief narrative: 80 yo male with COPD, resident of ALF, who presented to [facility] ED 05/24/2013 status post mechanical fall while ambulating with the walker. Pt was found to have left hip fracture and underwent ORIF. His hospital course is complicated due to post operative blood loss. He will receive 2 units PRBC today.   Assessment/Plan:   Principal Problem:  Left hip fracture  - secondary to mechanical fall  - no significant complaints of pain - pain regimen with morphine 0.5 mg IV every 2 hours PRN and norco PO every 6 hours PRN  - plan for SNF discharge, SW assisting discharge plan  - antibiotics per ortho team   Active Problems:   Acute postoperative blood loss anemia - order placed for 2 units PRBC to be given today - follow up CBC in am HTN  - BP is 125/46 this am  - continue metoprolol 25 mg PO BID and spironolactone 25 mg daily  Hypothyroidism  - continue synthroid 100 mcg daily  Severe protein caloric malnutrition:  - ensure TID.  History of A.fib:  - in normal sinus rhythm based on 12 lead EKG 05/25/2013, reviewed EKG personally  - rate control with metoprolol 25 mg PO BID  BPH - continue Proscar Constipation - senna BID  Code Status: Full  Family Communication: Pt at bedside  Disposition Plan: to SNF likely in next 24 hours; SW assisting discharge plan   Consultants:  Orthopedic surgery  Procedures:  ORIF 2.1.2015 2 Units PRBC to transfuse 05/27/2013 Antibiotics:  Cefazolin  Leisa Lenz, MD  [facility] Pager [phone]  If 7PM-7AM, please contact night-coverage [email or web address] Password [facility] 05/27/2013, 2:43 PM   LOS: 3 days    HPI/Subjective: No acute overnight events.   Objective: Filed Vitals:   05/26/13 2159 05/27/13 0626 05/27/13 0800 05/27/13 1200  BP: 118/43 125/46    Pulse: 97 92    Temp: 99 F (37.2 C) 98.4 F (36.9  C)    TempSrc:      Resp: 18 18 16 18   Height:      Weight:      SpO2: 96% 99% 98% 98%    Intake/Output Summary (Last 24 hours) at 05/27/13 1443 Last data filed at 05/26/13 2300  Gross per 24 hour  Intake    120 ml  Output    150 ml  Net    -30 ml    Exam:   General:  Pt is alert, follows commands appropriately, not in acute distress  Cardiovascular: Regular rate and rhythm, S1/S2 appreciated  Respiratory: Clear to auscultation bilaterally, no wheezing, no crackles, no rhonchi  Abdomen: Soft, non tender, non distended, bowel sounds present, no guarding  Extremities: No edema, pulses DP and PT palpable bilaterally  Neuro: Grossly nonfocal  Data Reviewed: Basic Metabolic Panel:  Recent Labs Lab 05/24/13 1929 05/25/13 0432 05/25/13 1543 05/26/13 0540 05/27/13 0641  NA 136* 138 136* 139 134*  K 4.4 4.7 4.4 5.3 5.0  CL 102 105  --  107 104  CO2 23 23  --  17* 21  GLUCOSE 103* 92 86 88 125*  BUN 14 14  --  22 34*  CREATININE 0.77 0.63  --  0.89 0.89  CALCIUM 8.9 8.6  --  8.0* 7.7*   Liver Function Tests: No results found for this basename: AST, ALT, ALKPHOS, BILITOT, PROT, ALBUMIN,  in the last 168 hours  No results found for this basename: LIPASE, AMYLASE,  in the last 168 hours No results found for this basename: AMMONIA,  in the last 168 hours CBC:  Recent Labs Lab 05/24/13 1929 05/25/13 0432 05/25/13 1543 05/26/13 0540 05/27/13 0641  WBC 6.0 9.0  --  14.8* 13.3*  NEUTROABS 3.6  --   --   --   --   HGB 11.2* 10.0* 9.5* 9.7* 7.6*  HCT 32.3* 29.3* 28.0* 28.3* 21.6*  MCV 89.5 89.6  --  86.5 86.7  PLT 128* 133*  --  163 168   Cardiac Enzymes: No results found for this basename: CKTOTAL, CKMB, CKMBINDEX, TROPONINI,  in the last 168 hours BNP: No components found with this basename: POCBNP,  CBG: No results found for this basename: GLUCAP,  in the last 168 hours  SURGICAL PCR SCREEN     Status: None   Collection Time    05/25/13 12:42 AM       Result Value Range Status   MRSA, PCR NEGATIVE  NEGATIVE Final   Staphylococcus aureus NEGATIVE  NEGATIVE Final  URINE CULTURE     Status: None   Collection Time    05/25/13  1:06 AM      Result Value Range Status   Specimen Description URINE, CATHETERIZED   Final   Special Requests NONE   Final   Culture  Setup Time     Final   Value: 05/25/2013 14:38     Performed at [place]     Final   Value: NO GROWTH     Performed at [facility]   Culture     Final   Value: NO GROWTH     Performed at [facility]   Report Status 05/26/2013 FINAL   Final     Studies: Dg Femur Left 05/25/2013    IMPRESSION: ORIF left intertrochanteric fracture.   Electronically Signed   By: Kathreen Devoid   On: 05/25/2013 16:35   Dg Pelvis Portable 05/26/2013   IMPRESSION: Patient status post open reduction internal fixation left hip fracture with good alignment on AP view. There is a displaced lesser trochanter fracture.   Electronically Signed   By: Marcello Moores  Register   On: 05/26/2013 07:13   Dg Hip Portable 1 View Left 05/26/2013     IMPRESSION: Good anatomic alignment  left hip fracture status post ORIF.   Electronically Signed   By: Marcello Moores  Register   On: 05/26/2013 07:19   Dg C-arm 1-60 Min 05/25/2013   IMPRESSION: ORIF left intertrochanteric fracture.   Electronically Signed   By: Kathreen Devoid   On: 05/25/2013 16:35    Scheduled Meds: . acetaminophen  650 mg Oral Once  . aspirin  81 mg Oral QHS  . enoxaparin (LOVENOX) injection  40 mg Subcutaneous Q24H  . feeding supplement (ENSURE COMPLETE)  237 mL Oral TID BM  . finasteride  5 mg Oral QHS  . furosemide  20 mg Intravenous Once  . levothyroxine  100 mcg Oral QAC breakfast  . metoprolol tartrate  25 mg Oral BID  . mirtazapine  30 mg Oral QHS  . pantoprazole  40 mg Oral QHS  . senna-docusate  1 tablet Oral BID  . spironolactone  25 mg Oral QODAY   Continuous Infusions: . 0.45 % NaCl with KCl 20 mEq / L 75  mL/hr at 05/27/13 0253

## 2013-05-27 NOTE — Progress Notes (Signed)
[place] MD notified of duplicate "Transfuse 2 RBC" orders. Confirmed only wishes to transfuse 2 units, NOT 4 units. Duplicate discontinued.

## 2013-05-27 NOTE — Progress Notes (Signed)
CSW (Clinical Education officer, museum) aware pt will not dc today. [place] met with pt and pt daughter, and because of copayment issues pt daughter asked to meet with representative from [facility] as well. Pt family has not changed their mind and would like for pt to dc to [facility] when ready. CSW updated insurance and facility and notified of potential dc for tomorrow.  [place], [place]

## 2013-05-27 NOTE — Progress Notes (Signed)
OT Cancellation Note  Patient Details Name: Raymond Goeller Sr. MRN: [id number] DOB: 09-15-1922   Cancelled Treatment:    Reason Eval/Treat Not Completed: Other (comment). Pt is for SNF, will defer OT eval to that facility. If OT eval is needed, please call [phone] or page [phone].  Rolm Baptise [id number] 05/27/2013, 3:03 PM

## 2013-05-27 NOTE — Progress Notes (Addendum)
CSW (Clinical Education officer, museum) called and left voicemail for pt daughter [name] and informed of bed offers. CSW also spoke with pt daughter Reche Dixon and gave bed offers. CSW informed that decision will need to be made ASAP on facility as both facilities that are able to offer have very limited beds available today. Per MD yesterday, pt ready for dc today but will need to check in to confirm this is still the plan.  Pt daughter called and confirmed they would like [facility]. CSW informed facility and insurance of family choice. Paged MD to confirm dc is still planned for today.  [place], [place]

## 2013-05-28 ENCOUNTER — Inpatient Hospital Stay (HOSPITAL_COMMUNITY): Payer: Medicare HMO

## 2013-05-28 DIAGNOSIS — E039 Hypothyroidism, unspecified: Secondary | ICD-10-CM

## 2013-05-28 LAB — TYPE AND SCREEN
ABO/RH(D): O POS
Antibody Screen: NEGATIVE
DAT, IgG: NEGATIVE
UNIT DIVISION: 0
Unit division: 0
Unit division: 0
Unit division: 0

## 2013-05-28 LAB — URINALYSIS W MICROSCOPIC + REFLEX CULTURE
Bilirubin Urine: NEGATIVE
GLUCOSE, UA: NEGATIVE mg/dL
Hgb urine dipstick: NEGATIVE
Ketones, ur: NEGATIVE mg/dL
Nitrite: NEGATIVE
Protein, ur: NEGATIVE mg/dL
Specific Gravity, Urine: 1.02 (ref 1.005–1.030)
Urobilinogen, UA: 0.2 mg/dL (ref 0.0–1.0)
pH: 5.5 (ref 5.0–8.0)

## 2013-05-28 LAB — URINALYSIS, ROUTINE W REFLEX MICROSCOPIC

## 2013-05-28 LAB — TRANSFUSION REACTION
DAT C3: NEGATIVE
POST RXN DAT IGG: NEGATIVE

## 2013-05-28 LAB — CBC
HCT: 30.4 % — ABNORMAL LOW (ref 39.0–52.0)
HEMOGLOBIN: 10.6 g/dL — AB (ref 13.0–17.0)
MCH: 30.1 pg (ref 26.0–34.0)
MCHC: 34.9 g/dL (ref 30.0–36.0)
MCV: 86.4 fL (ref 78.0–100.0)
Platelets: 167 10*3/uL (ref 150–400)
RBC: 3.52 MIL/uL — AB (ref 4.22–5.81)
RDW: 16.3 % — ABNORMAL HIGH (ref 11.5–15.5)
WBC: 18.7 10*3/uL — ABNORMAL HIGH (ref 4.0–10.5)

## 2013-05-28 LAB — GLUCOSE, CAPILLARY: Glucose-Capillary: 96 mg/dL (ref 70–99)

## 2013-05-28 NOTE — Progress Notes (Signed)
Physical Therapy Treatment Patient Details Name: Raymond Larsen Sr. MRN: [id number] DOB: [birth year] Today's Date: 05/28/2013 Time: 6301-6010 PT Time Calculation (min): 19 min  PT Assessment / Plan / Recommendation  History of Present Illness Pt is an 80 y/o male admitted s/p fall at the facility where he resides after pushing walker to the side. Pt sustained a left femur fracture and is now s/p IM nailing and WBAT.   PT Comments   Pt reports fatigue during session today, which appeared to be limiting his ability to perform OOB activity at this time. He was able to transfer from bed to recliner, however was not able to tolerate any further ambulation. Will continue to follow.   Follow Up Recommendations  SNF     Does the patient have the potential to tolerate intense rehabilitation     Barriers to Discharge        Equipment Recommendations  Other (comment) (TBD by next venue of care)    Recommendations for Other Services    Frequency Min 3X/week   Progress towards PT Goals Progress towards PT goals: Progressing toward goals  Plan Current plan remains appropriate    Precautions / Restrictions Precautions Precautions: Fall Restrictions Weight Bearing Restrictions: Yes LLE Weight Bearing: Weight bearing as tolerated   Pertinent Vitals/Pain Pt reports moderate pain in LLE during therapeutic exercise; patient repositioned for comfort     Mobility  Bed Mobility Overal bed mobility: Needs Assistance Bed Mobility: Supine to Sit Supine to sit: Mod assist General bed mobility comments: VC's for sequencing and technique. Assist for movement and support of LLE, as well as for trunk elevation to full sitting on EOB.  Transfers Overall transfer level: Needs assistance Equipment used: Rolling walker (2 wheeled) Transfers: Sit to/from [facility] Sit to Stand: Min assist;+2 physical assistance Stand pivot transfers: Mod assist;+2 physical assistance General transfer  comment: VC's for hand placement on seated surface for safety. Assist to come to full stand with the walker and position walker occasionally during SPT to recliner.    Exercises General Exercises - Lower Extremity Ankle Circles/Pumps: 10 reps Quad Sets: 10 reps Long Arc Quad: 10 reps Hip ABduction/ADduction: 10 reps   PT Diagnosis:    PT Problem List:   PT Treatment Interventions:     PT Goals (current goals can now be found in the care plan section) Acute Rehab PT Goals Patient Stated Goal: To return to ALF PT Goal Formulation: With patient/family Time For Goal Achievement: 06/09/13 Potential to Achieve Goals: Fair  Visit Information  Last PT Received On: 05/28/13 Assistance Needed: +2 History of Present Illness: Pt is an 80 y/o male admitted s/p fall at the facility where he resides after pushing walker to the side. Pt sustained a left femur fracture and is now s/p IM nailing and WBAT.    Subjective Data  Subjective: "I am tired today." Patient Stated Goal: To return to ALF   Cognition  Cognition Arousal/Alertness: Awake/alert Behavior During Therapy: WFL for tasks assessed/performed Overall Cognitive Status: Within Functional Limits for tasks assessed    Balance  Balance Overall balance assessment: Needs assistance Sitting-balance support: Feet supported;Bilateral upper extremity supported Sitting balance-Leahy Scale: Fair Postural control: Posterior lean Standing balance support: Bilateral upper extremity supported Standing balance-Leahy Scale: Poor  End of Session PT - End of Session Equipment Utilized During Treatment: Gait belt Activity Tolerance: Patient limited by fatigue Patient left: in chair;with call bell/phone within reach;with family/visitor present Nurse Communication: Mobility status   GP  Jolyn Lent 05/28/2013, 5:00 PM  Jolyn Lent, [place], DPT [phone]

## 2013-05-28 NOTE — Progress Notes (Signed)
     Subjective:  Patient reports pain as mild.  Patient sitting in bed, eating breakfast.  No acute distress.  Denies CP.  Objective:   VITALS:   Filed Vitals:   05/27/13 2345 05/28/13 0000 05/28/13 0110 05/28/13 0400  BP: 118/46     Pulse: 90     Temp: 102.2 F (39 C)  99.2 F (37.3 C)   TempSrc: Oral  Oral   Resp: 17 17  16   Height:      Weight:      SpO2: 95% 96%      ABD soft Sensation intact distally Intact pulses distally Dorsiflexion/Plantar flexion intact Incision: dressing C/D/I and scant drainage   Lab Results  Component Value Date   WBC 18.7* 05/28/2013   HGB 10.6* 05/28/2013   HCT 30.4* 05/28/2013   MCV 86.4 05/28/2013   PLT 167 05/28/2013     Assessment/Plan: 3 Days Post-Op   Principal Problem:   Intertrochanteric fracture of left hip Active Problems:   Protein-calorie malnutrition, severe   Postoperative anemia due to acute blood loss   Advance diet Up with therapy Discharge per medical service Weight bearing as tolerated Dry dressings prn ABLA received blood yesterday.  Improved hgb since yesterday.  Continue to monitor.  Remonia Richter 05/28/2013, 8:58 AM  Discussed and agree with above.  Marchia Bond, MD Cell [phone]

## 2013-05-28 NOTE — Progress Notes (Addendum)
[facility] PROGRESS NOTE  Raymond Riechers Sr. [id number] DOB: [birth year] DOA: 05/24/2013 PCP: Raymond Sou, MD  Brief history  80 yo male with COPD, resident of ALF, who presented to [facility] ED 05/24/2013 status post mechanical fall while ambulating with the walker. Pt was found to have left hip fracture and underwent ORIF. His hospital course is complicated due to post operative blood loss. He will receive 2 units PRBC today.   Assessment/Plan:  Principal Problem:  1. Left hip fracture secondary to mechanical fall s/p intramedullary nail 2/1 - plan for SNF discharge, SW assisting discharge plan   2. Acute postoperative blood loss anemia s/p 2 units PRBC 2/3; Hg improved   3. Fever+leukocytosis r/o infection; ? TF related+ stress;  -obtain CXT, UA; influenza, blood cultures; monitor   4. HTN continue metoprolol 25 mg PO BID, hold spironolactone due to hyper K, hold diuretics patient is euvolemic;    5. Hypothyroidism continue synthroid 100 mcg daily   6. Severe protein caloric malnutrition: ensure TID.   7. History of A.fib: in normal sinus rhythm based on 12 lead EKG 05/25/2013, reviewed EKG personally  - rate control with metoprolol 25 mg PO BID, noty on anticoagulation due to fall risk   8. BPH continue Proscar  Constipation  senna BID   Will monitor 24 hours in hospital due to fever    Code Status: fiull Family Communication: d/w patient, updated  Raymond Larsen Daughter [phone] (indicate person spoken with, relationship, and if by phone, the number) Disposition Plan: snf Consultants:  Orthopedic surgery  Procedures:  ORIF 2.1.2015  2 Units PRBC to transfuse 05/27/2013 Antibiotics:  Cefazolin  HPI/Subjective: alert  Objective: Filed Vitals:   05/28/13 0400  BP:   Pulse:   Temp:   Resp: 16    Intake/Output Summary (Last 24 hours) at 05/28/13 0837 Last data filed at 05/28/13 0700  Gross per 24 hour  Intake 2745.67 ml  Output    450 ml  Net 2295.67 ml    Filed Weights   05/26/13 1300  Weight: 70.308 kg (155 lb)    Exam:   General:  alert  Cardiovascular: s1,s2 rrr  Respiratory: CTA BL  Abdomen: soft, nt, nd   Musculoskeletal: no LE edema   Data Reviewed: Basic Metabolic Panel:  Recent Labs Lab 05/24/13 1929 05/25/13 0432 05/25/13 1543 05/26/13 0540 05/27/13 0641  NA 136* 138 136* 139 134*  K 4.4 4.7 4.4 5.3 5.0  CL 102 105  --  107 104  CO2 23 23  --  17* 21  GLUCOSE 103* 92 86 88 125*  BUN 14 14  --  22 34*  CREATININE 0.77 0.63  --  0.89 0.89  CALCIUM 8.9 8.6  --  8.0* 7.7*   Liver Function Tests: No results found for this basename: AST, ALT, ALKPHOS, BILITOT, PROT, ALBUMIN,  in the last 168 hours No results found for this basename: LIPASE, AMYLASE,  in the last 168 hours No results found for this basename: AMMONIA,  in the last 168 hours CBC:  Recent Labs Lab 05/24/13 1929 05/25/13 0432 05/25/13 1543 05/26/13 0540 05/27/13 0641 05/28/13 0025  WBC 6.0 9.0  --  14.8* 13.3* 18.7*  NEUTROABS 3.6  --   --   --   --   --   HGB 11.2* 10.0* 9.5* 9.7* 7.6* 10.6*  HCT 32.3* 29.3* 28.0* 28.3* 21.6* 30.4*  MCV 89.5 89.6  --  86.5 86.7 86.4  PLT 128* 133*  --  163 168  167   Cardiac Enzymes: No results found for this basename: CKTOTAL, CKMB, CKMBINDEX, TROPONINI,  in the last 168 hours BNP (last 3 results) No results found for this basename: PROBNP,  in the last 8760 hours CBG: No results found for this basename: GLUCAP,  in the last 168 hours  Recent Results (from the past 240 hour(s))  SURGICAL PCR SCREEN     Status: None   Collection Time    05/25/13 12:42 AM      Result Value Range Status   MRSA, PCR NEGATIVE  NEGATIVE Final   Staphylococcus aureus NEGATIVE  NEGATIVE Final   Comment:            The Xpert SA Assay (FDA     approved for NASAL specimens     in patients over 3 years of age),     is one component of     a comprehensive surveillance     program.  Test performance has     been  validated by [employer] for patients greater     than or equal to 47 year old.     It is not intended     to diagnose infection nor to     guide or monitor treatment.  URINE CULTURE     Status: None   Collection Time    05/25/13  1:06 AM      Result Value Range Status   Specimen Description URINE, CATHETERIZED   Final   Special Requests NONE   Final   Culture  Setup Time     Final   Value: 05/25/2013 14:38     Performed at [place]     Final   Value: NO GROWTH     Performed at [facility]   Culture     Final   Value: NO GROWTH     Performed at [facility]   Report Status 05/26/2013 FINAL   Final     Studies: Dg Chest 1 View  05/28/2013   CLINICAL DATA:  Fever.  EXAM: CHEST - 1 VIEW  COMPARISON:  1/31/ 2015.  FINDINGS: Patient is rotated to the right. Lung volumes are low with bilateral basilar atelectasis. There is no airspace disease or effusion identified. Tortuous thoracic aorta with arch atherosclerosis. Right shoulder hemiarthroplasty partially visualized. Severe left shoulder glenohumeral osteoarthritis and chronic rotator cuff tear.  IMPRESSION: Low volume chest with basilar atelectasis. Negative for pneumonia or acute cardiopulmonary disease.   Electronically Signed   By: Dereck Ligas M.D.   On: 05/28/2013 08:03    Scheduled Meds: . aspirin  81 mg Oral QHS  . enoxaparin (LOVENOX) injection  40 mg Subcutaneous Q24H  . feeding supplement (ENSURE COMPLETE)  237 mL Oral TID BM  . finasteride  5 mg Oral QHS  . levothyroxine  100 mcg Oral QAC breakfast  . metoprolol tartrate  25 mg Oral BID  . mirtazapine  30 mg Oral QHS  . pantoprazole  40 mg Oral QHS  . senna-docusate  1 tablet Oral BID  . spironolactone  25 mg Oral QODAY   Continuous Infusions: . 0.45 % NaCl with KCl 20 mEq / L 20 mL/hr at 05/28/13 [id number]    Principal Problem:   Intertrochanteric fracture of left hip Active Problems:   Protein-calorie malnutrition,  severe   Postoperative anemia due to acute blood loss    Time spent: >35 minutes  Kinnie Feil  [facility] Pager [phone]. If 7PM-7AM, please contact night-coverage at [email or web address], password [facility] 05/28/2013, 8:37 AM  LOS: 4 days

## 2013-05-29 DIAGNOSIS — I2510 Atherosclerotic heart disease of native coronary artery without angina pectoris: Secondary | ICD-10-CM

## 2013-05-29 LAB — CBC
HCT: 28 % — ABNORMAL LOW (ref 39.0–52.0)
Hemoglobin: 9.6 g/dL — ABNORMAL LOW (ref 13.0–17.0)
MCH: 29.9 pg (ref 26.0–34.0)
MCHC: 34.3 g/dL (ref 30.0–36.0)
MCV: 87.2 fL (ref 78.0–100.0)
PLATELETS: 147 10*3/uL — AB (ref 150–400)
RBC: 3.21 MIL/uL — ABNORMAL LOW (ref 4.22–5.81)
RDW: 16.8 % — ABNORMAL HIGH (ref 11.5–15.5)
WBC: 18.4 10*3/uL — ABNORMAL HIGH (ref 4.0–10.5)

## 2013-05-29 LAB — INFLUENZA PANEL BY PCR (TYPE A & B)
H1N1FLUPCR: NOT DETECTED
INFLAPCR: NEGATIVE
Influenza B By PCR: NEGATIVE

## 2013-05-29 MED ORDER — LORAZEPAM 0.5 MG PO TABS: 0.5000 mg | ORAL_TABLET | Freq: Two times a day (BID) | ORAL | Status: DC | PRN

## 2013-05-29 MED ORDER — FUROSEMIDE 40 MG PO TABS: 20.0000 mg | ORAL_TABLET | ORAL | Status: DC

## 2013-05-29 NOTE — Progress Notes (Signed)
Report was called to [facility]

## 2013-05-29 NOTE — Progress Notes (Signed)
CSW (Clinical Education officer, museum) prepared pt dc packet and placed with shadow chart. CSW arranged non-emergent ambulance transport at 1:30pm. Pt, pt family, pt nurse, and facility informed. CSW signing off.  [place], [place]

## 2013-05-29 NOTE — Discharge Summary (Signed)
Physician Discharge Summary  Baze Beevers Sr. [id number] DOB: 1922/08/22 DOA: 05/24/2013  PCP: Tammi Sou, MD  Admit date: 05/24/2013 Discharge date: 05/29/2013  Time spent: >35 minutes  Recommendations for Outpatient Follow-up:  F/u with ortho as scheduled F/u with PCP in 1-2 weeks post rehab  Discharge Diagnoses:  Principal Problem:   Intertrochanteric fracture of left hip Active Problems:   Protein-calorie malnutrition, severe   Postoperative anemia due to acute blood loss   Discharge Condition: stable   Diet recommendation: heart healthy   Filed Weights   05/26/13 1300  Weight: 70.308 kg (155 lb)    History of present illness:  80 yo male with COPD, resident of ALF, who presented to [facility] ED 05/24/2013 status post mechanical fall while ambulating with the walker. Pt was found to have left hip fracture and underwent ORIF.   Hospital Course:  Assessment/Plan:  Principal Problem:  1. Left hip fracture secondary to mechanical fall s/p intramedullary nail 2/1  - plan for SNF discharge, SW assisting discharge plan  2. Acute postoperative blood loss anemia s/p 2 units PRBC 2/3; Hg improved  3. Fever+leukocytosis r/o infection; ? TF related+ stress;  -CXR no infiltrate, blood c/s NTD, UA unremarkable; pend influenza,  -fever resolved; cont monitor at shf 4. HTN continue metoprolol 25 mg PO BID, decreased diuretics patient is euvolemic; outpatient titrate as needed 5. Hypothyroidism continue synthroid 100 mcg daily  6. Severe protein caloric malnutrition: ensure TID.  7. History of A.fib: in normal sinus rhythm based on 12 lead EKG 05/25/2013, reviewed EKG personally  - rate control with metoprolol 25 mg PO BID, noty on anticoagulation due to fall risk  8. BPH continue Proscar  Constipation senna BID      Procedures:  Hip surgery (i.e. Studies not automatically included, echos, thoracentesis, etc; not x-rays)  Consultations:  ortho  Discharge Exam: Filed Vitals:    05/29/13 0400  BP: 120/50  Pulse: 88  Temp: 99.4 F (37.4 C)  Resp: 16    General: alert Cardiovascular: s1,s2 rrr Respiratory: CTA BL  Discharge Instructions  Discharge Orders   Future Appointments Provider Department Dept Phone   06/19/2013 2:30 PM Tammi Sou, MD [place] [phone]   Future Orders Complete By Expires   Diet - low sodium heart healthy  As directed    Discharge instructions  As directed    Comments:     Please follow up with orthopedics as scheduled   Increase activity slowly  As directed    Weight bearing as tolerated  As directed    Questions:     Laterality:     Extremity:         Medication List         acetaminophen 500 MG tablet  Commonly known as:  TYLENOL  Take 1,000 mg by mouth every 6 (six) hours as needed (headache and/or minor discomfort). Contact physician if headache or pain persists longer than 24 hours     aspirin 81 MG chewable tablet  Chew 81 mg by mouth at bedtime.     enoxaparin 40 MG/0.4ML injection  Commonly known as:  LOVENOX  Inject 0.4 mLs (40 mg total) into the skin daily.     finasteride 5 MG tablet  Commonly known as:  PROSCAR  Take 5 mg by mouth at bedtime.     furosemide 40 MG tablet  Commonly known as:  LASIX  Take 0.5 tablets (20 mg total) by mouth every other day.  guaiFENesin 100 MG/5ML Soln  Commonly known as:  ROBITUSSIN  Take 10 mLs by mouth every 6 (six) hours as needed for cough or to loosen phlegm. Do not exceed 4 doses in 24 hours.  If cough has not improved in 24 hours notify physicians     HYDROcodone-acetaminophen 5-325 MG per tablet  Commonly known as:  NORCO  Take 1-2 tablets by mouth every 6 (six) hours as needed. MAXIMUM TOTAL ACETAMINOPHEN DOSE IS 4000 MG PER DAY     levothyroxine 100 MCG tablet  Commonly known as:  SYNTHROID, LEVOTHROID  Take 100 mcg by mouth daily.     LORazepam 0.5 MG tablet  Commonly known as:  ATIVAN  Take 1 tablet (0.5 mg  total) by mouth 2 (two) times daily as needed for anxiety or sleep.     meclizine 25 MG tablet  Commonly known as:  ANTIVERT  Take 25 mg by mouth 2 (two) times daily as needed for dizziness.     metoprolol tartrate 25 MG tablet  Commonly known as:  LOPRESSOR  Take 1 tablet (25 mg total) by mouth 2 (two) times daily.     mirtazapine 30 MG tablet  Commonly known as:  REMERON  Take 30 mg by mouth at bedtime.     nitroGLYCERIN 0.4 MG SL tablet  Commonly known as:  NITROSTAT  Place 0.4 mg under the tongue every 5 (five) minutes as needed for chest pain.     pantoprazole 40 MG tablet  Commonly known as:  PROTONIX  Take 40 mg by mouth at bedtime.     polyethylene glycol packet  Commonly known as:  MIRALAX / GLYCOLAX  Take 17 g by mouth daily as needed (constipation). Mix in 8 oz of water and drink     sennosides-docusate sodium 8.6-50 MG tablet  Commonly known as:  SENOKOT-S  Take 2 tablets by mouth daily.     spironolactone 25 MG tablet  Commonly known as:  ALDACTONE  Take 25 mg by mouth every other day.     vitamin B-12 1000 MCG tablet  Commonly known as:  CYANOCOBALAMIN  Take 1,000 mcg by mouth daily.       Allergies  Allergen Reactions  . Amitriptyline Other (See Comments)    Dizziness   . Beta Adrenergic Blockers Other (See Comments)    lethargy  . Imdur [Isosorbide]     Headache   . Niacin And Related Rash       . Sulfa Antibiotics Itching, Nausea And Vomiting and Rash       Follow-up Information   Follow up with Johnny Bridge, MD. Schedule an appointment as soon as possible for a visit in 2 weeks.   Specialty:  Orthopedic Surgery   Contact information:   [address] [place] [address] [phone]       Follow up with Tammi Sou, MD In 1 week.   Specialty:  Family Medicine   Contact information:   [id number] [place]  [phone]        The results of significant diagnostics from this  hospitalization (including imaging, microbiology, ancillary and laboratory) are listed below for reference.    Significant Diagnostic Studies: Dg Chest 1 View  05/28/2013   CLINICAL DATA:  Fever.  EXAM: CHEST - 1 VIEW  COMPARISON:  1/31/ 2015.  FINDINGS: Patient is rotated to the right. Lung volumes are low with bilateral basilar atelectasis. There is no airspace disease or  effusion identified. Tortuous thoracic aorta with arch atherosclerosis. Right shoulder hemiarthroplasty partially visualized. Severe left shoulder glenohumeral osteoarthritis and chronic rotator cuff tear.  IMPRESSION: Low volume chest with basilar atelectasis. Negative for pneumonia or acute cardiopulmonary disease.   Electronically Signed   By: Dereck Ligas M.D.   On: 05/28/2013 08:03   Dg Chest 1 View  05/24/2013   CLINICAL DATA:  Fall, hip pain  EXAM: CHEST - 1 VIEW  COMPARISON:  03/07/2013  FINDINGS: Mild cardiac enlargement stable. Calcification of the aortic arch. Vascular pattern normal. Lungs clear.  IMPRESSION: No active disease.   Electronically Signed   By: Skipper Cliche M.D.   On: 05/24/2013 20:22   Dg Hip Complete Left  05/24/2013   CLINICAL DATA:  Fall, hip pain  EXAM: LEFT HIP - COMPLETE 2+ VIEW  COMPARISON:  None.  FINDINGS: There is a comminuted fracture of the left femur. Fracture line extends through the intertrochanteric region, with moderate varus angulation and displacement of the lesser trochanter medially.  IMPRESSION: Intertrochanteric left femur fracture   Electronically Signed   By: Skipper Cliche M.D.   On: 05/24/2013 20:22   Dg Femur Left  05/25/2013   CLINICAL DATA:  Left proximal femur pinning  EXAM: LEFT FEMUR - 2 VIEW; DG C-ARM 1-60 MIN  COMPARISON:  None  FLUOROSCOPY TIME:  27 second  FINDINGS: Five intraoperative fluoroscopic spot images of provided. There has been interval placement of a left femoral intra medullary nail transfixing a comminuted left intertrochanteric fracture which is in near  anatomic alignment. There is a interlocking cannulated femoral neck screw. There is no hardware failure or complication. There is no dislocation.  IMPRESSION: ORIF left intertrochanteric fracture.   Electronically Signed   By: Kathreen Devoid   On: 05/25/2013 16:35   Dg Pelvis Portable  05/26/2013   CLINICAL DATA:  Hip fracture.  EXAM: PORTABLE PELVIS 1-2 VIEWS  COMPARISON:  Left hip series 2 04/2013.  FINDINGS: Patient is status post open reduction internal fixation of left hip fracture with good anatomic alignment. There is a displaced lesser trochanter fracture. Diffuse osteopenia and degenerative change present. Calcifications in pelvis consistent with phleboliths.  IMPRESSION: Patient status post open reduction internal fixation left hip fracture with good alignment on AP view. There is a displaced lesser trochanter fracture.   Electronically Signed   By: Marcello Moores  Register   On: 05/26/2013 07:13   Dg Hip Portable 1 View Left  05/26/2013   CLINICAL DATA:  Left hip fracture.  EXAM: PORTABLE LEFT HIP - 1 VIEW  COMPARISON:  Hip series 2 04/2013.  FINDINGS: Cross-table portable lateral view left hip reveals good anatomic alignment status post open reduction internal fixation of left hip fracture .  IMPRESSION: Good anatomic alignment  left hip fracture status post ORIF.   Electronically Signed   By: Marcello Moores  Register   On: 05/26/2013 07:19   Dg C-arm 1-60 Min  05/25/2013   CLINICAL DATA:  Left proximal femur pinning  EXAM: LEFT FEMUR - 2 VIEW; DG C-ARM 1-60 MIN  COMPARISON:  None  FLUOROSCOPY TIME:  27 second  FINDINGS: Five intraoperative fluoroscopic spot images of provided. There has been interval placement of a left femoral intra medullary nail transfixing a comminuted left intertrochanteric fracture which is in near anatomic alignment. There is a interlocking cannulated femoral neck screw. There is no hardware failure or complication. There is no dislocation.  IMPRESSION: ORIF left intertrochanteric fracture.    Electronically Signed   By: Elbert Ewings  Patel   On: 05/25/2013 16:35    Microbiology: Recent Results (from the past 240 hour(s))  SURGICAL PCR SCREEN     Status: None   Collection Time    05/25/13 12:42 AM      Result Value Range Status   MRSA, PCR NEGATIVE  NEGATIVE Final   Staphylococcus aureus NEGATIVE  NEGATIVE Final   Comment:            The Xpert SA Assay (FDA     approved for NASAL specimens     in patients over 15 years of age),     is one component of     a comprehensive surveillance     program.  Test performance has     been validated by [employer] for patients greater     than or equal to 31 year old.     It is not intended     to diagnose infection nor to     guide or monitor treatment.  URINE CULTURE     Status: None   Collection Time    05/25/13  1:06 AM      Result Value Range Status   Specimen Description URINE, CATHETERIZED   Final   Special Requests NONE   Final   Culture  Setup Time     Final   Value: 05/25/2013 14:38     Performed at [facility]     Final   Value: NO GROWTH     Performed at [facility]   Culture     Final   Value: NO GROWTH     Performed at [facility]   Report Status 05/26/2013 FINAL   Final  CULTURE, BLOOD (ROUTINE X 2)     Status: None   Collection Time    05/28/13 10:15 AM      Result Value Range Status   Specimen Description BLOOD ARM LEFT   Final   Special Requests BOTTLES DRAWN AEROBIC AND ANAEROBIC 5CC   Final   Culture  Setup Time     Final   Value: 05/28/2013 13:45     Performed at [facility]   Culture     Final   Value:        BLOOD CULTURE RECEIVED NO GROWTH TO DATE CULTURE WILL BE HELD FOR 5 DAYS BEFORE ISSUING A FINAL NEGATIVE REPORT     Performed at [facility]   Report Status PENDING   Incomplete  CULTURE, BLOOD (ROUTINE X 2)     Status: None   Collection Time    05/28/13 10:25 AM      Result Value Range Status   Specimen Description BLOOD RIGHT  HAND   Final   Special Requests BOTTLES DRAWN AEROBIC AND ANAEROBIC 5CC   Final   Culture  Setup Time     Final   Value: 05/28/2013 13:46     Performed at [facility]   Culture     Final   Value:        BLOOD CULTURE RECEIVED NO GROWTH TO DATE CULTURE WILL BE HELD FOR 5 DAYS BEFORE ISSUING A FINAL NEGATIVE REPORT     Performed at [facility]   Report Status PENDING   Incomplete     Labs: Basic Metabolic Panel:  Recent Labs Lab 05/24/13 1929 05/25/13 0432 05/25/13 1543 05/26/13 0540 05/27/13 0641  NA 136* 138 136* 139 134*  K 4.4 4.7 4.4 5.3 5.0  CL 102 105  --  107 104  CO2 23 23  --  17* 21  GLUCOSE 103* 92 86 88 125*  BUN 14 14  --  22 34*  CREATININE 0.77 0.63  --  0.89 0.89  CALCIUM 8.9 8.6  --  8.0* 7.7*   Liver Function Tests: No results found for this basename: AST, ALT, ALKPHOS, BILITOT, PROT, ALBUMIN,  in the last 168 hours No results found for this basename: LIPASE, AMYLASE,  in the last 168 hours No results found for this basename: AMMONIA,  in the last 168 hours CBC:  Recent Labs Lab 05/24/13 1929 05/25/13 0432 05/25/13 1543 05/26/13 0540 05/27/13 0641 05/28/13 0025 05/29/13 0617  WBC 6.0 9.0  --  14.8* 13.3* 18.7* 18.4*  NEUTROABS 3.6  --   --   --   --   --   --   HGB 11.2* 10.0* 9.5* 9.7* 7.6* 10.6* 9.6*  HCT 32.3* 29.3* 28.0* 28.3* 21.6* 30.4* 28.0*  MCV 89.5 89.6  --  86.5 86.7 86.4 87.2  PLT 128* 133*  --  163 168 167 147*   Cardiac Enzymes: No results found for this basename: CKTOTAL, CKMB, CKMBINDEX, TROPONINI,  in the last 168 hours BNP: BNP (last 3 results) No results found for this basename: PROBNP,  in the last 8760 hours CBG:  Recent Labs Lab 05/28/13 2202  GLUCAP 96       Signed:  Denielle Bayard N  [facility] 05/29/2013, 10:37 AM

## 2013-05-29 NOTE — Progress Notes (Signed)
     Subjective:  Patient reports pain as mild.  Patient was sitting in bed, alert and asking when he is going to leave here.  Objective:   VITALS:   Filed Vitals:   05/28/13 1235 05/28/13 1400 05/28/13 2019 05/29/13 0400  BP: 118/46 121/64 113/58 120/50  Pulse:  80 90 88  Temp:  99.1 F (37.3 C) 100.1 F (37.8 C) 99.4 F (37.4 C)  TempSrc:  Oral Oral Oral  Resp:  17 16 16   Height:      Weight:      SpO2:  98% 98% 98%    ABD soft Neurovascular intact Sensation intact distally Intact pulses distally Dorsiflexion/Plantar flexion intact Incision: dressing C/D/I Proximal dressing changed again, mild saturation. No cellulitis or purulence.  Lab Results  Component Value Date   WBC 18.4* 05/29/2013   HGB 9.6* 05/29/2013   HCT 28.0* 05/29/2013   MCV 87.2 05/29/2013   PLT 147* 05/29/2013     Assessment/Plan: 4 Days Post-Op   Principal Problem:   Intertrochanteric fracture of left hip Active Problems:   Protein-calorie malnutrition, severe   Postoperative anemia due to acute blood loss   Advance diet Up with therapy WBAT Discharge per medical service Okay for discharge from orthopedic standpoint Dry dressing prn   Remonia Richter 05/29/2013, 7:25 AM   Marchia Bond, MD Cell [phone]

## 2013-05-30 ENCOUNTER — Other Ambulatory Visit: Payer: Self-pay | Admitting: Orthopedic Surgery

## 2013-06-02 ENCOUNTER — Encounter (HOSPITAL_COMMUNITY): Payer: Self-pay | Admitting: Orthopedic Surgery

## 2013-06-03 LAB — CULTURE, BLOOD (ROUTINE X 2)
CULTURE: NO GROWTH
Culture: NO GROWTH

## 2013-06-19 ENCOUNTER — Ambulatory Visit: Payer: Medicare HMO | Admitting: Family Medicine

## 2013-07-13 ENCOUNTER — Emergency Department (HOSPITAL_COMMUNITY): Payer: Medicare HMO

## 2013-07-13 ENCOUNTER — Encounter (HOSPITAL_COMMUNITY): Payer: Self-pay | Admitting: Emergency Medicine

## 2013-07-13 ENCOUNTER — Encounter (HOSPITAL_COMMUNITY): Payer: Self-pay | Admitting: Anesthesiology

## 2013-07-13 ENCOUNTER — Encounter (HOSPITAL_COMMUNITY)
Admission: EM | Disposition: A | Discharge status: Skilled Nursing Facility | Payer: Self-pay | Source: Home / Self Care | Attending: Internal Medicine

## 2013-07-13 ENCOUNTER — Inpatient Hospital Stay (HOSPITAL_COMMUNITY)
Admission: EM | Admit: 2013-07-13 | Discharge: 2013-07-21 | DRG: 853 | Disposition: A | Discharge status: Skilled Nursing Facility | Payer: Medicare HMO | Attending: Internal Medicine | Admitting: Internal Medicine

## 2013-07-13 DIAGNOSIS — K7030 Alcoholic cirrhosis of liver without ascites: Secondary | ICD-10-CM | POA: Diagnosis present

## 2013-07-13 DIAGNOSIS — H9190 Unspecified hearing loss, unspecified ear: Secondary | ICD-10-CM | POA: Diagnosis present

## 2013-07-13 DIAGNOSIS — K7460 Unspecified cirrhosis of liver: Secondary | ICD-10-CM

## 2013-07-13 DIAGNOSIS — M19019 Primary osteoarthritis, unspecified shoulder: Secondary | ICD-10-CM

## 2013-07-13 DIAGNOSIS — R188 Other ascites: Secondary | ICD-10-CM | POA: Diagnosis present

## 2013-07-13 DIAGNOSIS — J690 Pneumonitis due to inhalation of food and vomit: Secondary | ICD-10-CM

## 2013-07-13 DIAGNOSIS — R609 Edema, unspecified: Secondary | ICD-10-CM

## 2013-07-13 DIAGNOSIS — N401 Enlarged prostate with lower urinary tract symptoms: Secondary | ICD-10-CM | POA: Diagnosis present

## 2013-07-13 DIAGNOSIS — Z96659 Presence of unspecified artificial knee joint: Secondary | ICD-10-CM

## 2013-07-13 DIAGNOSIS — G8929 Other chronic pain: Secondary | ICD-10-CM

## 2013-07-13 DIAGNOSIS — E039 Hypothyroidism, unspecified: Secondary | ICD-10-CM

## 2013-07-13 DIAGNOSIS — IMO0001 Reserved for inherently not codable concepts without codable children: Secondary | ICD-10-CM

## 2013-07-13 DIAGNOSIS — F411 Generalized anxiety disorder: Secondary | ICD-10-CM | POA: Diagnosis present

## 2013-07-13 DIAGNOSIS — I209 Angina pectoris, unspecified: Secondary | ICD-10-CM | POA: Diagnosis present

## 2013-07-13 DIAGNOSIS — D649 Anemia, unspecified: Secondary | ICD-10-CM

## 2013-07-13 DIAGNOSIS — E43 Unspecified severe protein-calorie malnutrition: Secondary | ICD-10-CM

## 2013-07-13 DIAGNOSIS — I4891 Unspecified atrial fibrillation: Secondary | ICD-10-CM

## 2013-07-13 DIAGNOSIS — R600 Localized edema: Secondary | ICD-10-CM

## 2013-07-13 DIAGNOSIS — K56609 Unspecified intestinal obstruction, unspecified as to partial versus complete obstruction: Secondary | ICD-10-CM

## 2013-07-13 DIAGNOSIS — IMO0002 Reserved for concepts with insufficient information to code with codable children: Secondary | ICD-10-CM

## 2013-07-13 DIAGNOSIS — K219 Gastro-esophageal reflux disease without esophagitis: Secondary | ICD-10-CM | POA: Diagnosis present

## 2013-07-13 DIAGNOSIS — K4090 Unilateral inguinal hernia, without obstruction or gangrene, not specified as recurrent: Secondary | ICD-10-CM

## 2013-07-13 DIAGNOSIS — G9340 Encephalopathy, unspecified: Secondary | ICD-10-CM | POA: Diagnosis present

## 2013-07-13 DIAGNOSIS — D684 Acquired coagulation factor deficiency: Secondary | ICD-10-CM | POA: Diagnosis present

## 2013-07-13 DIAGNOSIS — M129 Arthropathy, unspecified: Secondary | ICD-10-CM | POA: Diagnosis present

## 2013-07-13 DIAGNOSIS — R112 Nausea with vomiting, unspecified: Secondary | ICD-10-CM

## 2013-07-13 DIAGNOSIS — K7031 Alcoholic cirrhosis of liver with ascites: Secondary | ICD-10-CM | POA: Diagnosis present

## 2013-07-13 DIAGNOSIS — Z79899 Other long term (current) drug therapy: Secondary | ICD-10-CM

## 2013-07-13 DIAGNOSIS — S72142A Displaced intertrochanteric fracture of left femur, initial encounter for closed fracture: Secondary | ICD-10-CM

## 2013-07-13 DIAGNOSIS — I2510 Atherosclerotic heart disease of native coronary artery without angina pectoris: Secondary | ICD-10-CM

## 2013-07-13 DIAGNOSIS — Z96619 Presence of unspecified artificial shoulder joint: Secondary | ICD-10-CM

## 2013-07-13 DIAGNOSIS — Z7982 Long term (current) use of aspirin: Secondary | ICD-10-CM

## 2013-07-13 DIAGNOSIS — Z66 Do not resuscitate: Secondary | ICD-10-CM | POA: Diagnosis present

## 2013-07-13 DIAGNOSIS — M1712 Unilateral primary osteoarthritis, left knee: Secondary | ICD-10-CM

## 2013-07-13 DIAGNOSIS — M7918 Myalgia, other site: Secondary | ICD-10-CM

## 2013-07-13 DIAGNOSIS — J189 Pneumonia, unspecified organism: Secondary | ICD-10-CM

## 2013-07-13 DIAGNOSIS — N5089 Other specified disorders of the male genital organs: Secondary | ICD-10-CM | POA: Diagnosis not present

## 2013-07-13 DIAGNOSIS — E871 Hypo-osmolality and hyponatremia: Secondary | ICD-10-CM | POA: Diagnosis present

## 2013-07-13 DIAGNOSIS — I10 Essential (primary) hypertension: Secondary | ICD-10-CM

## 2013-07-13 DIAGNOSIS — A419 Sepsis, unspecified organism: Principal | ICD-10-CM

## 2013-07-13 DIAGNOSIS — D62 Acute posthemorrhagic anemia: Secondary | ICD-10-CM

## 2013-07-13 DIAGNOSIS — E860 Dehydration: Secondary | ICD-10-CM | POA: Diagnosis present

## 2013-07-13 DIAGNOSIS — N400 Enlarged prostate without lower urinary tract symptoms: Secondary | ICD-10-CM | POA: Diagnosis present

## 2013-07-13 DIAGNOSIS — K4030 Unilateral inguinal hernia, with obstruction, without gangrene, not specified as recurrent: Secondary | ICD-10-CM

## 2013-07-13 DIAGNOSIS — R5381 Other malaise: Secondary | ICD-10-CM | POA: Diagnosis not present

## 2013-07-13 DIAGNOSIS — N138 Other obstructive and reflux uropathy: Secondary | ICD-10-CM | POA: Diagnosis present

## 2013-07-13 DIAGNOSIS — J1100 Influenza due to unidentified influenza virus with unspecified type of pneumonia: Secondary | ICD-10-CM

## 2013-07-13 DIAGNOSIS — Z87891 Personal history of nicotine dependence: Secondary | ICD-10-CM

## 2013-07-13 DIAGNOSIS — K5900 Constipation, unspecified: Secondary | ICD-10-CM | POA: Diagnosis present

## 2013-07-13 DIAGNOSIS — Z8701 Personal history of pneumonia (recurrent): Secondary | ICD-10-CM

## 2013-07-13 DIAGNOSIS — E785 Hyperlipidemia, unspecified: Secondary | ICD-10-CM

## 2013-07-13 HISTORY — DX: Pneumonia, unspecified organism: J18.9

## 2013-07-13 HISTORY — PX: FEMORAL HERNIA REPAIR: SHX632

## 2013-07-13 HISTORY — PX: BOWEL RESECTION: SHX1257

## 2013-07-13 HISTORY — PX: LAPAROTOMY: SHX154

## 2013-07-13 LAB — CBC WITH DIFFERENTIAL/PLATELET
Basophils Absolute: 0 10*3/uL (ref 0.0–0.1)
Basophils Relative: 0 % (ref 0–1)
EOS ABS: 0 10*3/uL (ref 0.0–0.7)
EOS PCT: 0 % (ref 0–5)
HCT: 37.1 % — ABNORMAL LOW (ref 39.0–52.0)
HEMOGLOBIN: 13.1 g/dL (ref 13.0–17.0)
LYMPHS ABS: 0.9 10*3/uL (ref 0.7–4.0)
Lymphocytes Relative: 6 % — ABNORMAL LOW (ref 12–46)
MCH: 31.8 pg (ref 26.0–34.0)
MCHC: 35.3 g/dL (ref 30.0–36.0)
MCV: 90 fL (ref 78.0–100.0)
Monocytes Absolute: 0.8 10*3/uL (ref 0.1–1.0)
Monocytes Relative: 5 % (ref 3–12)
Neutro Abs: 13.9 10*3/uL — ABNORMAL HIGH (ref 1.7–7.7)
Neutrophils Relative %: 89 % — ABNORMAL HIGH (ref 43–77)
Platelets: 215 10*3/uL (ref 150–400)
RBC: 4.12 MIL/uL — ABNORMAL LOW (ref 4.22–5.81)
RDW: 15.5 % (ref 11.5–15.5)
WBC: 15.6 10*3/uL — ABNORMAL HIGH (ref 4.0–10.5)

## 2013-07-13 LAB — COMPREHENSIVE METABOLIC PANEL
ALK PHOS: 194 U/L — AB (ref 39–117)
ALT: 11 U/L (ref 0–53)
AST: 27 U/L (ref 0–37)
Albumin: 2.4 g/dL — ABNORMAL LOW (ref 3.5–5.2)
BUN: 31 mg/dL — AB (ref 6–23)
CALCIUM: 8.8 mg/dL (ref 8.4–10.5)
CO2: 23 mEq/L (ref 19–32)
Chloride: 92 mEq/L — ABNORMAL LOW (ref 96–112)
Creatinine, Ser: 0.8 mg/dL (ref 0.50–1.35)
GFR calc non Af Amer: 77 mL/min — ABNORMAL LOW (ref >90)
GFR, EST AFRICAN AMERICAN: 90 mL/min — AB (ref >90)
GLUCOSE: 84 mg/dL (ref 70–99)
POTASSIUM: 4.4 meq/L (ref 3.7–5.3)
Sodium: 130 mEq/L — ABNORMAL LOW (ref 137–147)
Total Bilirubin: 1.1 mg/dL (ref 0.3–1.2)
Total Protein: 5.9 g/dL — ABNORMAL LOW (ref 6.0–8.3)

## 2013-07-13 LAB — LIPASE, BLOOD: Lipase: 47 U/L (ref 11–59)

## 2013-07-13 LAB — PROTIME-INR
INR: 1.78 — AB (ref 0.00–1.49)
PROTHROMBIN TIME: 20.2 s — AB (ref 11.6–15.2)

## 2013-07-13 LAB — I-STAT CG4 LACTIC ACID, ED: Lactic Acid, Venous: 3.35 mmol/L — ABNORMAL HIGH (ref 0.5–2.2)

## 2013-07-13 SURGERY — HERNIA REPAIR FEMORAL
Anesthesia: General | Site: Groin | Laterality: Right

## 2013-07-13 MED ORDER — IOHEXOL 350 MG/ML SOLN
100.0000 mL | Freq: Once | INTRAVENOUS | Status: AC | PRN
  Administered 2013-07-13: 100 mL via INTRAVENOUS

## 2013-07-13 MED ORDER — PIPERACILLIN-TAZOBACTAM 3.375 G IVPB: 3.3750 g | Freq: Once | INTRAVENOUS | Status: DC

## 2013-07-13 MED ORDER — VITAMIN K1 10 MG/ML IJ SOLN
5.0000 mg | Freq: Once | INTRAVENOUS | Status: AC
  Administered 2013-07-13: 5 mg via INTRAVENOUS
  Filled 2013-07-13: qty 0.5

## 2013-07-13 MED ORDER — LACTATED RINGERS IV SOLN
INTRAVENOUS | Status: DC
  Administered 2013-07-14: 04:00:00 via INTRAVENOUS

## 2013-07-13 MED ORDER — IOHEXOL 300 MG/ML  SOLN: 20.0000 mL | INTRAMUSCULAR | Status: DC

## 2013-07-13 MED ORDER — FENTANYL CITRATE 0.05 MG/ML IJ SOLN
INTRAMUSCULAR | Status: AC
  Filled 2013-07-13: qty 5

## 2013-07-13 MED ORDER — SODIUM CHLORIDE 0.9 % IV BOLUS (SEPSIS)
1000.0000 mL | Freq: Once | INTRAVENOUS | Status: AC
  Administered 2013-07-13: 1000 mL via INTRAVENOUS

## 2013-07-13 MED ORDER — PROPOFOL 10 MG/ML IV BOLUS
INTRAVENOUS | Status: AC
  Filled 2013-07-13: qty 20

## 2013-07-13 MED ORDER — PIPERACILLIN-TAZOBACTAM 3.375 G IVPB
3.3750 g | Freq: Once | INTRAVENOUS | Status: AC
  Administered 2013-07-14: 3.375 g via INTRAVENOUS
  Filled 2013-07-13 (×2): qty 50

## 2013-07-13 MED ORDER — VANCOMYCIN HCL IN DEXTROSE 1-5 GM/200ML-% IV SOLN
1000.0000 mg | Freq: Once | INTRAVENOUS | Status: AC
  Administered 2013-07-13: 1000 mg via INTRAVENOUS
  Filled 2013-07-13: qty 200

## 2013-07-13 MED ORDER — ONDANSETRON HCL 4 MG/2ML IJ SOLN
4.0000 mg | Freq: Once | INTRAMUSCULAR | Status: AC
  Administered 2013-07-13: 4 mg via INTRAVENOUS
  Filled 2013-07-13: qty 2

## 2013-07-13 SURGICAL SUPPLY — 72 items
ADH SKN CLS APL DERMABOND .7 (GAUZE/BANDAGES/DRESSINGS)
BLADE SURG 10 STRL SS (BLADE) ×2 IMPLANT
BLADE SURG 15 STRL LF DISP TIS (BLADE) ×2 IMPLANT
BLADE SURG 15 STRL SS (BLADE)
BLADE SURG ROTATE 9660 (MISCELLANEOUS) ×3 IMPLANT
CANISTER SUCTION 2500CC (MISCELLANEOUS) ×3 IMPLANT
CHLORAPREP W/TINT 26ML (MISCELLANEOUS) ×5 IMPLANT
COVER SURGICAL LIGHT HANDLE (MISCELLANEOUS) ×5 IMPLANT
DECANTER SPIKE VIAL GLASS SM (MISCELLANEOUS) ×4 IMPLANT
DERMABOND ADVANCED (GAUZE/BANDAGES/DRESSINGS)
DERMABOND ADVANCED .7 DNX12 (GAUZE/BANDAGES/DRESSINGS) ×2 IMPLANT
DRAIN PENROSE 1/2X12 LTX STRL (WOUND CARE) ×3 IMPLANT
DRAPE LAPAROTOMY TRNSV 102X78 (DRAPE) ×2 IMPLANT
DRAPE UTILITY 15X26 W/TAPE STR (DRAPE) ×10 IMPLANT
DRESSING ALLEVYN LIFE SACRUM (GAUZE/BANDAGES/DRESSINGS) ×3 IMPLANT
DRSG COVADERM 4X10 (GAUZE/BANDAGES/DRESSINGS) ×6 IMPLANT
DRSG COVADERM 4X8 (GAUZE/BANDAGES/DRESSINGS) ×3 IMPLANT
ELECT CAUTERY BLADE 6.4 (BLADE) ×5 IMPLANT
ELECT REM PT RETURN 9FT ADLT (ELECTROSURGICAL) ×5
ELECTRODE REM PT RTRN 9FT ADLT (ELECTROSURGICAL) ×3 IMPLANT
GLOVE BIO SURGEON STRL SZ 6 (GLOVE) ×8 IMPLANT
GLOVE BIO SURGEON STRL SZ7 (GLOVE) ×3 IMPLANT
GLOVE BIO SURGEON STRL SZ8 (GLOVE) ×6 IMPLANT
GLOVE BIOGEL PI IND STRL 6.5 (GLOVE) ×3 IMPLANT
GLOVE BIOGEL PI IND STRL 7.5 (GLOVE) ×1 IMPLANT
GLOVE BIOGEL PI IND STRL 8 (GLOVE) ×2 IMPLANT
GLOVE BIOGEL PI INDICATOR 6.5 (GLOVE) ×2
GLOVE BIOGEL PI INDICATOR 7.5 (GLOVE) ×2
GLOVE BIOGEL PI INDICATOR 8 (GLOVE) ×4
GOWN STRL REUS W/ TWL LRG LVL3 (GOWN DISPOSABLE) ×3 IMPLANT
GOWN STRL REUS W/TWL 2XL LVL3 (GOWN DISPOSABLE) ×11 IMPLANT
GOWN STRL REUS W/TWL LRG LVL3 (GOWN DISPOSABLE) ×5
KIT BASIN OR (CUSTOM PROCEDURE TRAY) ×5 IMPLANT
KIT ROOM TURNOVER OR (KITS) ×5 IMPLANT
LIGASURE IMPACT 36 18CM CVD LR (INSTRUMENTS) ×3 IMPLANT
NDL HYPO 25GX1X1/2 BEV (NEEDLE) ×2 IMPLANT
NEEDLE HYPO 25GX1X1/2 BEV (NEEDLE) ×5 IMPLANT
NS IRRIG 1000ML POUR BTL (IV SOLUTION) ×8 IMPLANT
PACK GENERAL/GYN (CUSTOM PROCEDURE TRAY) ×3 IMPLANT
PACK SURGICAL SETUP 50X90 (CUSTOM PROCEDURE TRAY) ×5 IMPLANT
PAD ARMBOARD 7.5X6 YLW CONV (MISCELLANEOUS) ×11 IMPLANT
PENCIL BUTTON HOLSTER BLD 10FT (ELECTRODE) ×2 IMPLANT
RELOAD PROXIMATE 75MM BLUE (ENDOMECHANICALS) ×10 IMPLANT
RELOAD STAPLE 75 3.8 BLU REG (ENDOMECHANICALS) IMPLANT
SPECIMEN JAR MEDIUM (MISCELLANEOUS) ×6 IMPLANT
SPONGE INTESTINAL PEANUT (DISPOSABLE) ×5 IMPLANT
SPONGE LAP 18X18 X RAY DECT (DISPOSABLE) ×5 IMPLANT
STAPLER GUN LINEAR PROX 60 (STAPLE) ×3 IMPLANT
STAPLER PROXIMATE 75MM BLUE (STAPLE) ×3 IMPLANT
STAPLER VISISTAT (STAPLE) ×3 IMPLANT
SUCTION POOLE TIP (SUCTIONS) ×3 IMPLANT
SUT MNCRL AB 4-0 PS2 18 (SUTURE) ×2 IMPLANT
SUT PDS AB 1 TP1 96 (SUTURE) ×9 IMPLANT
SUT PROLENE 2 0 SH 30 (SUTURE) ×3 IMPLANT
SUT PROLENE 2 0 SH DA (SUTURE) ×3 IMPLANT
SUT SILK 2 0 SH (SUTURE) ×2 IMPLANT
SUT VIC AB 0 CT1 27 (SUTURE) ×10
SUT VIC AB 0 CT1 27XBRD ANBCTR (SUTURE) ×2 IMPLANT
SUT VIC AB 2-0 SH 27 (SUTURE) ×5
SUT VIC AB 2-0 SH 27X BRD (SUTURE) ×3 IMPLANT
SUT VIC AB 3-0 SH 27 (SUTURE)
SUT VIC AB 3-0 SH 27X BRD (SUTURE) ×2 IMPLANT
SUT VIC AB 3-0 SH 27XBRD (SUTURE) ×2 IMPLANT
SUT VIC AB 3-0 SH 8-18 (SUTURE) ×3 IMPLANT
SYR BULB 3OZ (MISCELLANEOUS) ×2 IMPLANT
SYR CONTROL 10ML LL (SYRINGE) ×5 IMPLANT
TOWEL OR 17X24 6PK STRL BLUE (TOWEL DISPOSABLE) ×2 IMPLANT
TOWEL OR 17X26 10 PK STRL BLUE (TOWEL DISPOSABLE) ×5 IMPLANT
TRAY FOLEY CATH 14FRSI W/METER (CATHETERS) ×3 IMPLANT
TUBE CONNECTING 12'X1/4 (SUCTIONS)
TUBE CONNECTING 12X1/4 (SUCTIONS) IMPLANT
YANKAUER SUCT BULB TIP NO VENT (SUCTIONS) IMPLANT

## 2013-07-13 NOTE — ED Notes (Signed)
Admitting doctor at the bedside.  vank has infused

## 2013-07-13 NOTE — ED Notes (Signed)
Dr Aline Brochure given a copy of lactic acid results 3.35

## 2013-07-13 NOTE — ED Notes (Signed)
Per EMS: Patient came from [facility]. Daughters insisted he came due to weakness and vomiting x3 days. Patient complains of overall weakness and fatigue. Upon EMS arrival, Patient had a temperature of 100.6. Patient was given 333 mg of Tylenol. In EMS truck, patient was placed on monitor where EKG was taken. EMS reports abnormal EKG. Patient oriented and alert x4. Patient was given 324 mg of Aspirin PTA. Vitals: 130/76, 94 HR, CBG 94, 97% on RA.

## 2013-07-13 NOTE — ED Notes (Signed)
Pt in xray

## 2013-07-13 NOTE — ED Notes (Signed)
The pt just returned fromxray drowsy but  Responds readily to questions asked.  Iv fluids  [place] med given

## 2013-07-13 NOTE — ED Notes (Signed)
Family at the bedside.  nss infusing still.  Pt appears to be comfortable

## 2013-07-13 NOTE — ED Notes (Signed)
Family at the bedside.  Vancomycin has almost infused

## 2013-07-13 NOTE — H&P (Signed)
Patient's PCP: Tammi Sou, MD  Chief Complaint: Cough, nausea & vomiting  History of Present Illness: Raymond Pilar Sr. is a 80 y.o. Caucasian male with history of hypertension, hyperlipidemia, A. fib not on anticoagulation due to bleeding in 2014, history of pneumonia, hypothyroidism, GERD, constipation, depression, history of alcoholic cirrhosis, and history of left hip fracture in January of 2015 status post surgery currently at [facility] rehabilitation who presents with the above complaints.  Patient is confused and cannot provide any history, most of the history was obtained from daughter and family at bedside.  Daughter noted that his symptoms started on 07/09/2013 when she noted the his right inguinal hernia area was swollen.  During the night of 07/10/2013 he started having nausea and vomiting.  He vomited multiple times during the night.  He was given suppository and MiraLAX at the facility, as it was thought that his symptoms were due to constipation.  As the patient was not improving and he was confused, family had the patient be brought to the emergency department for further evaluation.  Chest x-ray showed right lower lobe pneumonia versus aspiration pneumonitis.  CT of abdomen and pelvis with contrast showed incarcerated right inguinal hernia with evidence of decreased perfusion of small bowel within the hernia and proximal small bowel obstruction.  General surgery was consulted.  Hospitalist service was asked to admit the patient for further care and management.  Review of Systems: Cannot be obtained due to altered mental status.  Past Medical History  Diagnosis Date  . Arthritis   . Hyperlipidemia   . Hypertension   . A-fib     Dr Wynonia Lawman; WAS on Coumadin until 04/2012  . Angina     uses NTG prn  . Pneumonia     12 /2012  . Recurrent upper respiratory infection (URI)     03/2011  . Hypothyroidism     x 10 yrs  . GERD (gastroesophageal reflux disease)   . Constipation,  chronic   . Depression   . History of dizziness   . Anxiety   . Urinary frequency   . Hearing loss of both ears     wears hearing aides  . BPH with obstruction/lower urinary tract symptoms     sees Dr Gaynelle Arabian  . Compression fracture of thoracic vertebra, non-traumatic 06/14/12    T6 and T7 (noted on CT angio chest to r/o PE)  . Alcoholic cirrhosis of liver with ascites 06/14/12    Noted on CT angio chest to r/o PE  . Esophageal varices in alcoholic cirrhosis "   "    "   "    "  . Intertrochanteric fracture of left hip 05/24/2013   Past Surgical History  Procedure Laterality Date  . Total knee arthroplasty  1994    right  . Total shoulder replacement  1997    right  . Joint replacement      Rt knee and shoulder  . Hernia repair  1990    LIH repair  . Cholecystectomy  2004  . Inguinal hernia repair  05/09/2011    Procedure: HERNIA REPAIR INGUINAL ADULT;  Surgeon: Odis Hollingshead, MD;  Location: [place];  Service: General;  Laterality: Right;  . Femur im nail Left 05/25/2013    Procedure: INTRAMEDULLARY (IM) NAIL FEMORAL;  Surgeon: Johnny Bridge, MD;  Location: [place];  Service: Orthopedics;  Laterality: Left;   Family History  Problem Relation Age of Onset  . Alcoholism Father   . Cirrhosis  Father    History   Social History  . Marital Status: Married    Spouse Name: N/A    Number of Children: N/A  . Years of Education: N/A   Occupational History  . Not on file.   Social History Main Topics  . Smoking status: Former Smoker -- 0.50 packs/day for 35 years    Types: Cigarettes    Quit date: 05/02/1984  . Smokeless tobacco: Current User    Types: Chew  . Alcohol Use: No  . Drug Use: No  . Sexual Activity: Not Currently   Other Topics Concern  . Not on file   Social History Narrative   Lives with wife in [place].   Wife with dementia.   Worked all his life in [employer], did carpentry work on the side.  Served in the [employer] ([place] in [place]   Hx of alcohol  abuse up until his 67s.   Says he smoked "years ago" but "I never did inhale".   No chewing tobacco or dip.                     Allergies: Amitriptyline; Beta adrenergic blockers; Imdur; Niacin and related; and Sulfa antibiotics  Home Meds: Prior to Admission medications   Medication Sig Start Date End Date Taking? Authorizing Provider  acetaminophen (TYLENOL) 500 MG tablet Take 1,000 mg by mouth every 6 (six) hours as needed for headache. Do not exceed more than 4000 mg in a 24 hour period   Yes Historical Provider, MD  aspirin EC 81 MG tablet Take 81 mg by mouth at bedtime.   Yes Historical Provider, MD  finasteride (PROSCAR) 5 MG tablet Take 5 mg by mouth at bedtime.  05/01/12  Yes Historical Provider, MD  furosemide (LASIX) 20 MG tablet Take 20 mg by mouth every other day.   Yes Historical Provider, MD  guaiFENesin (ROBITUSSIN) 100 MG/5ML SOLN Take 200 mg by mouth every 6 (six) hours as needed for cough or to loosen phlegm. Do not exceed 4 doses in 24 hours.  If cough has not improved in 24 hours notify physicians   Yes Historical Provider, MD  HYDROcodone-acetaminophen (NORCO/VICODIN) 5-325 MG per tablet Take 1-2 tablets by mouth every 6 (six) hours as needed (1 tablet for mild pain, 2 tablets for severe pain).   Yes Historical Provider, MD  levothyroxine (SYNTHROID, LEVOTHROID) 100 MCG tablet Take 100 mcg by mouth daily before breakfast.    Yes Historical Provider, MD  LORazepam (ATIVAN) 0.5 MG tablet Take 1 tablet (0.5 mg total) by mouth 2 (two) times daily as needed for anxiety or sleep. 05/29/13  Yes Kinnie Feil, MD  meclizine (ANTIVERT) 25 MG tablet Take 25 mg by mouth 2 (two) times daily as needed for dizziness.   Yes Historical Provider, MD  metoprolol tartrate (LOPRESSOR) 25 MG tablet Take 1 tablet (25 mg total) by mouth 2 (two) times daily. 12/04/12  Yes Tammi Sou, MD  mirtazapine (REMERON) 30 MG tablet Take 30 mg by mouth at bedtime.  01/04/11  Yes Historical Provider,  MD  nitroGLYCERIN (NITROSTAT) 0.4 MG SL tablet Place 0.4 mg under the tongue every 5 (five) minutes as needed for chest pain. Give 1 tablet every 5 minutes for chest pain x 3 doses.  If unrelieved after 3 doses, call MD   Yes Historical Provider, MD  omeprazole (PRILOSEC) 20 MG capsule Take 20 mg by mouth 2 (two) times daily. 7:30am and 4pm   Yes  Historical Provider, MD  pantoprazole (PROTONIX) 40 MG tablet Take 40 mg by mouth at bedtime.  01/24/11  Yes Historical Provider, MD  polyethylene glycol (MIRALAX / GLYCOLAX) packet Take 17 g by mouth daily as needed (constipation). Mix in 8 oz of water and drink   Yes Historical Provider, MD  PRESCRIPTION MEDICATION Take 120 mLs by mouth 2 (two) times daily. Med Plus nutritional supplement   Yes Historical Provider, MD  sennosides-docusate sodium (SENOKOT-S) 8.6-50 MG tablet Take 2 tablets by mouth daily. 05/25/13  Yes Johnny Bridge, MD  spironolactone (ALDACTONE) 25 MG tablet Take 25 mg by mouth every other day.    Yes Historical Provider, MD  vitamin B-12 (CYANOCOBALAMIN) 1000 MCG tablet Take 1,000 mcg by mouth daily.   Yes Historical Provider, MD    Physical Exam: Blood pressure 100/52, pulse 77, temperature 97.8 F (36.6 C), temperature source Oral, resp. rate 24, height 5\' 8"  (1.727 m), weight 68.04 kg (150 lb), SpO2 94.00%. General: Somnolent, Not Oriented x3, No acute distress. HEENT: EOMI, dry mucous membranes, dry mouth with excoriations Neck: Supple CV: S1 and S2 Lungs: Clear to ascultation bilaterally Abdomen: Soft, Nontender, Nondistended, +bowel sounds. GU: Swelling over the right inguinal region. Ext: Good pulses. Trace edema. No clubbing or cyanosis noted. Neuro: Cranial Nerves II-XII grossly intact. Has 5/5 motor strength in upper and lower extremities.  Lab results:  Recent Labs  07/13/13 1940  NA 130*  K 4.4  CL 92*  CO2 23  GLUCOSE 84  BUN 31*  CREATININE 0.80  CALCIUM 8.8    Recent Labs  07/13/13 1940  AST 27   ALT 11  ALKPHOS 194*  BILITOT 1.1  PROT 5.9*  ALBUMIN 2.4*    Recent Labs  07/13/13 1940  LIPASE 47    Recent Labs  07/13/13 1940  WBC 15.6*  NEUTROABS 13.9*  HGB 13.1  HCT 37.1*  MCV 90.0  PLT 215   No results found for this basename: CKTOTAL, CKMB, CKMBINDEX, TROPONINI,  in the last 72 hours No components found with this basename: POCBNP,  No results found for this basename: DDIMER,  in the last 72 hours No results found for this basename: HGBA1C,  in the last 72 hours No results found for this basename: CHOL, HDL, LDLCALC, TRIG, CHOLHDL, LDLDIRECT,  in the last 72 hours No results found for this basename: TSH, T4TOTAL, FREET3, T3FREE, THYROIDAB,  in the last 72 hours No results found for this basename: VITAMINB12, FOLATE, FERRITIN, TIBC, IRON, RETICCTPCT,  in the last 72 hours Imaging results:  Dg Chest 2 View  07/13/2013   CLINICAL DATA:  Fever, abnormal EKG  EXAM: CHEST  2 VIEW  COMPARISON:  DG CHEST 1 VIEW dated 05/28/2013  FINDINGS: There is right lower lobe airspace disease. There is no other focal parenchymal opacity. No pleural effusion or pneumothorax. The heart and mediastinal contours are unremarkable.  Right shoulder arthroplasty. Osteoarthritis of the left glenohumeral joint.  IMPRESSION: Right lower lobe pneumonia. Recommend followup radiography in 4-6 weeks, to document complete resolution following adequate medical therapy. If there is not complete resolution, then recommend further evaluation with CT of the chest to exclude underlying pathology.   Electronically Signed   By: Kathreen Devoid   On: 07/13/2013 20:24   Ct Cta Abd/pel W/cm &/or W/o Cm  07/13/2013   CLINICAL DATA:  Nausea and vomiting.  Poor p.o. intake.  EXAM: CT ANGIOGRAPHY ABDOMEN AND PELVIS WITH CONTRAST AND WITHOUT CONTRAST  TECHNIQUE: Multidetector CT imaging  of the abdomen and pelvis was performed using the standard protocol during bolus administration of intravenous contrast. Multiplanar  reconstructed images and MIPs were obtained and reviewed to evaluate the vascular anatomy.  CONTRAST:  163mL OMNIPAQUE IOHEXOL 350 MG/ML SOLN  COMPARISON:  CT of the abdomen and pelvis 03/09/2012.  FINDINGS: Lung Bases: Multifocal peribronchovascular interstitial and airspace disease throughout the lower lobes of the lungs bilaterally, and the dependent portion of the right middle lobe, favored to reflect sequela of aspiration with aspiration pneumonitis/pneumonia. Mild cardiomegaly. Calcifications of the mitral annulus and aortic valve. Circumferential thickening of the distal esophagus.  Abdomen/Pelvis: The liver has a markedly shrunken appearance and nodular contour, compatible with advanced cirrhosis. No definite focal hepatic lesions are identified on today's examination. Status post cholecystectomy. Pancreas appears atrophic, but there are no definite focal pancreatic lesions. Small calcifications within the spleen likely represent calcified granulomas. The appearance of the adrenal glands is unremarkable bilaterally. Numerous tiny subcentimeter low attenuation lesions in the kidneys bilaterally are too small to definitively characterize. In addition in the right kidney, there are 2 simple cysts, largest of which is in the medial aspect of the lower pole measuring 2.2 cm in diameter. Prominent portosystemic collateral vessels are noted along the left side of the retroperitoneum, apparently communicating between the splenic circulation and the left iliac circulation. Numerous small gastric and esophageal varices are also noted.  There use a right inguinal hernia which appears to contain a short loop of small bowel. Small bowel proximal to this and the stomach all appear pathologically dilated, compatible with acute bowel obstruction, with bowel loops measuring up to 4.3 cm in diameter. The distal small bowel is decompressed, as is the colon. Numerous colonic diverticulae are noted. Trace volume of ascites. No  frank pneumoperitoneum to suggest bowel perforation at this time. Notably, although the loops of bowel immediately adjacent to the hernia demonstrate avid mucosal enhancement, the bowel loop within the hernia demonstrates markedly decreased enhancement, suggesting incarceration and ischemia. There is a small volume of fluid within the right inguinal hernia as well, which is low attenuation.  Musculoskeletal: Status post ORIF in the left femoral neck. There are no aggressive appearing lytic or blastic lesions noted in the visualized portions of the skeleton. T9 vertebral body compression fracture with 60% loss of anterior vertebral body height and 40% loss of posterior vertebral body height appears to be chronic.  Review of the MIP images confirms the above findings.  IMPRESSION: 1. Incarcerated right inguinal hernia containing a short loop of small bowel with evidence of decreased perfusion of the small bowel loop within the hernia, and proximal small bowel obstruction. Surgical consultation is strongly recommended. 2. No pneumoperitoneum at this time. 3. Small volume of ascites may be reactive, or could relate to underlying chronic liver disease. 4. Advanced cirrhosis, as above. 5. Colonic diverticulosis without findings to suggest acute diverticulitis at this time. 6. Multifocal peribronchovascular interstitial and airspace disease throughout the lung bases bilaterally (right greater than left), likely reflects sequela of recent aspiration. 7. Additional incidental findings, as above. These results were called by telephone at the time of interpretation on 07/13/2013 at 10:07 PM to Dr. Wendall Papa, who verbally acknowledged these results.   Electronically Signed   By: Vinnie Langton M.D.   On: 07/13/2013 22:10   Other results: EKG: Sinus rhythm heart rate 91.  Assessment & Plan by Problem: Sepsis  Due to incarcerated right inguinal hernia, small bowel obstruction, and healthcare associated pneumonia  versus aspiration  pneumonitis.  Admit the patient to step down for close monitoring.  Incarcerated right inguinal hernia and small bowel obstruction causing nausea and vomiting Management as per Dr. Barry Dienes, General surgery.  Discussed with general surgery, will give the patient one dose of Zosyn then transitioned to cefepime as per HCAP order set.  Patient to be taken to surgery tonight.  Patient will be n.p.o.  Will have NG placed after surgery.  Healthcare associated pneumonia versus aspiration pneumonitis Start patient on vancomycin and cefepime.  Check urine Legionella and strep pneumo.  Strongly suspect patient's symptoms may be related to aspiration pneumonitis.  Acute encephalopathy Due to sepsis. Continue to monitor.  Dehydration Due to nausea and vomiting.  Hydrate the patient on IV fluids.  Atrial fibrillation Rate control.  Again not anticoagulated due to history of bleeding last year.  Continue metoprolol.  Severe protein calorie malnutrition To be addressed after surgery.  Hypothyroidism Continue home medications.  Coronary artery disease Continue aspirin and metoprolol.  Hypertension Continue metoprolol with hold parameters.  Coagulopathy with elevated INR Likely due to history of cirrhosis and severe protein -calorie malnutrition.  Patient has been oral vitamin K and FFP by general surgery.  Prophylaxis Lovenox.  CODE STATUS DO NOT RESUSCITATE/DO NOT INTUBATE.  This was discussed with patient's daughter (HPOA) and family at bedside. Dr. Barry Dienes was present during the discussion.  Disposition Admit the patient as inpatient to step down.  Time spent on admission, talking to the patient, and coordinating care was: 50 mins.  Jacqualine Weichel A, MD 07/13/2013, 11:29 PM

## 2013-07-13 NOTE — Consult Note (Signed)
Reason for Consult:strangulated right inguinal hernia. Referring Physician: reddy   Maryla Morrow Sr. is an 80 y.o. male.  HPI:  Pt is an 80 yo m in SNF s/p fall with hip fracture around 6-7 weeks ago who presents with around 3-7 days of n/v/groin pain.  He has had a right sided hernia for a while, but it has usually been soft.  He has had a hernia for several years, and has meant to get it fixed, but has kept having health problems that have prevented it.  His daughters state that he had been doing well participating in pretty aggressive rehab for his hip until the last 1-2 weeks, when he started complaining of issues eating and having bowel movements.  He started throwing up.  He has progressively gotten weaker all week and is now so weak he is having issues lifting his body up on the bed.  He is normally very sharp mentally, but has started having moments of delirium yesterday and today.    He has been in NSR, and coumadin was stopped for risk of falls.    Past Medical History  Diagnosis Date  . Arthritis   . Hyperlipidemia   . Hypertension   . A-fib     Dr Wynonia Lawman; WAS on Coumadin until 04/2012  . Angina     uses NTG prn  . Pneumonia     12 /2012  . Recurrent upper respiratory infection (URI)     03/2011  . Hypothyroidism     x 10 yrs  . GERD (gastroesophageal reflux disease)   . Constipation, chronic   . Depression   . History of dizziness   . Anxiety   . Urinary frequency   . Hearing loss of both ears     wears hearing aides  . BPH with obstruction/lower urinary tract symptoms     sees Dr Gaynelle Arabian  . Compression fracture of thoracic vertebra, non-traumatic 06/14/12    T6 and T7 (noted on CT angio chest to r/o PE)  . Alcoholic cirrhosis of liver with ascites 06/14/12    Noted on CT angio chest to r/o PE  . Esophageal varices in alcoholic cirrhosis "   "    "   "    "  . Intertrochanteric fracture of left hip 05/24/2013    Past Surgical History  Procedure Laterality Date    . Total knee arthroplasty  1994    right  . Total shoulder replacement  1997    right  . Joint replacement      Rt knee and shoulder  . Hernia repair  1990    LIH repair  . Cholecystectomy  2004  . Inguinal hernia repair  05/09/2011    Procedure: HERNIA REPAIR INGUINAL ADULT;  Surgeon: Odis Hollingshead, MD;  Location: [facility];  Service: General;  Laterality: Right;  . Femur im nail Left 05/25/2013    Procedure: INTRAMEDULLARY (IM) NAIL FEMORAL;  Surgeon: Johnny Bridge, MD;  Location: [facility];  Service: Orthopedics;  Laterality: Left;    Family History  Problem Relation Age of Onset  . Alcoholism Father   . Cirrhosis Father     Social History:  reports that he quit smoking about 29 years ago. His smoking use included Cigarettes. He has a 17.5 pack-year smoking history. His smokeless tobacco use includes Chew. He reports that he does not drink alcohol or use illicit drugs.  Allergies:  Allergies  Allergen Reactions  . Amitriptyline Other (See Comments)  Dizziness   . Beta Adrenergic Blockers Other (See Comments)    lethargy  . Imdur [Isosorbide]     Headache   . Niacin And Related Rash       . Sulfa Antibiotics Itching, Nausea And Vomiting and Rash    Medications: MedicationsLong-Term  Outpatient Medications Hospital Medications    acetaminophen (TYLENOL) 500 MG tablet   aspirin EC 81 MG tablet   finasteride (PROSCAR) 5 MG tablet   furosemide (LASIX) 20 MG tablet   guaiFENesin (ROBITUSSIN) 100 MG/5ML SOLN   HYDROcodone-acetaminophen (NORCO/VICODIN) 5-325 MG per tablet   levothyroxine (SYNTHROID, LEVOTHROID) 100 MCG tablet   LORazepam (ATIVAN) 0.5 MG tablet   meclizine (ANTIVERT) 25 MG tablet   metoprolol tartrate (LOPRESSOR) 25 MG tablet   mirtazapine (REMERON) 30 MG tablet   nitroGLYCERIN (NITROSTAT) 0.4 MG SL tablet   omeprazole (PRILOSEC) 20 MG capsule   pantoprazole (PROTONIX) 40 MG tablet   polyethylene glycol (MIRALAX / GLYCOLAX) packet   PRESCRIPTION  MEDICATION   sennosides-docusate sodium (SENOKOT-S) 8.6-50 MG tablet   spironolactone (ALDACTONE) 25 MG tablet   vitamin B-12 (CYANOCOBALAMIN) 1000 MCG tablet     Results for orders placed during the hospital encounter of 07/13/13 (from the past 48 hour(s))  CBC WITH DIFFERENTIAL     Status: Abnormal   Collection Time    07/13/13  7:40 PM      Result Value Ref Range   WBC 15.6 (*) 4.0 - 10.5 K/uL   RBC 4.12 (*) 4.22 - 5.81 MIL/uL   Hemoglobin 13.1  13.0 - 17.0 g/dL   HCT 37.1 (*) 39.0 - 52.0 %   MCV 90.0  78.0 - 100.0 fL   MCH 31.8  26.0 - 34.0 pg   MCHC 35.3  30.0 - 36.0 g/dL   RDW 15.5  11.5 - 15.5 %   Platelets 215  150 - 400 K/uL   Neutrophils Relative % 89 (*) 43 - 77 %   Neutro Abs 13.9 (*) 1.7 - 7.7 K/uL   Lymphocytes Relative 6 (*) 12 - 46 %   Lymphs Abs 0.9  0.7 - 4.0 K/uL   Monocytes Relative 5  3 - 12 %   Monocytes Absolute 0.8  0.1 - 1.0 K/uL   Eosinophils Relative 0  0 - 5 %   Eosinophils Absolute 0.0  0.0 - 0.7 K/uL   Basophils Relative 0  0 - 1 %   Basophils Absolute 0.0  0.0 - 0.1 K/uL  COMPREHENSIVE METABOLIC PANEL     Status: Abnormal   Collection Time    07/13/13  7:40 PM      Result Value Ref Range   Sodium 130 (*) 137 - 147 mEq/L   Potassium 4.4  3.7 - 5.3 mEq/L   Chloride 92 (*) 96 - 112 mEq/L   CO2 23  19 - 32 mEq/L   Glucose, Bld 84  70 - 99 mg/dL   BUN 31 (*) 6 - 23 mg/dL   Creatinine, Ser 0.80  0.50 - 1.35 mg/dL   Calcium 8.8  8.4 - 10.5 mg/dL   Total Protein 5.9 (*) 6.0 - 8.3 g/dL   Albumin 2.4 (*) 3.5 - 5.2 g/dL   AST 27  0 - 37 U/L   ALT 11  0 - 53 U/L   Alkaline Phosphatase 194 (*) 39 - 117 U/L   Total Bilirubin 1.1  0.3 - 1.2 mg/dL   GFR calc non Af Amer 77 (*) >90 mL/min   GFR  calc Af Amer 90 (*) >90 mL/min   Comment: (NOTE)     The eGFR has been calculated using the CKD EPI equation.     This calculation has not been validated in all clinical situations.     eGFR's persistently <90 mL/min signify possible Chronic Kidney      Disease.  LIPASE, BLOOD     Status: None   Collection Time    07/13/13  7:40 PM      Result Value Ref Range   Lipase 47  11 - 59 U/L  I-STAT CG4 LACTIC ACID, ED     Status: Abnormal   Collection Time    07/13/13  8:01 PM      Result Value Ref Range   Lactic Acid, Venous 3.35 (*) 0.5 - 2.2 mmol/L  PROTIME-INR     Status: Abnormal   Collection Time    07/13/13 10:32 PM      Result Value Ref Range   Prothrombin Time 20.2 (*) 11.6 - 15.2 seconds   INR 1.78 (*) 0.00 - 1.49    Dg Chest 2 View  07/13/2013   CLINICAL DATA:  Fever, abnormal EKG  EXAM: CHEST  2 VIEW  COMPARISON:  DG CHEST 1 VIEW dated 05/28/2013  FINDINGS: There is right lower lobe airspace disease. There is no other focal parenchymal opacity. No pleural effusion or pneumothorax. The heart and mediastinal contours are unremarkable.  Right shoulder arthroplasty. Osteoarthritis of the left glenohumeral joint.  IMPRESSION: Right lower lobe pneumonia. Recommend followup radiography in 4-6 weeks, to document complete resolution following adequate medical therapy. If there is not complete resolution, then recommend further evaluation with CT of the chest to exclude underlying pathology.   Electronically Signed   By: Kathreen Devoid   On: 07/13/2013 20:24   Ct Cta Abd/pel W/cm &/or W/o Cm  07/13/2013   CLINICAL DATA:  Nausea and vomiting.  Poor p.o. intake.  EXAM: CT ANGIOGRAPHY ABDOMEN AND PELVIS WITH CONTRAST AND WITHOUT CONTRAST  TECHNIQUE: Multidetector CT imaging of the abdomen and pelvis was performed using the standard protocol during bolus administration of intravenous contrast. Multiplanar reconstructed images and MIPs were obtained and reviewed to evaluate the vascular anatomy.  CONTRAST:  116m OMNIPAQUE IOHEXOL 350 MG/ML SOLN  COMPARISON:  CT of the abdomen and pelvis 03/09/2012.  FINDINGS: Lung Bases: Multifocal peribronchovascular interstitial and airspace disease throughout the lower lobes of the lungs bilaterally, and the dependent  portion of the right middle lobe, favored to reflect sequela of aspiration with aspiration pneumonitis/pneumonia. Mild cardiomegaly. Calcifications of the mitral annulus and aortic valve. Circumferential thickening of the distal esophagus.  Abdomen/Pelvis: The liver has a markedly shrunken appearance and nodular contour, compatible with advanced cirrhosis. No definite focal hepatic lesions are identified on today's examination. Status post cholecystectomy. Pancreas appears atrophic, but there are no definite focal pancreatic lesions. Small calcifications within the spleen likely represent calcified granulomas. The appearance of the adrenal glands is unremarkable bilaterally. Numerous tiny subcentimeter low attenuation lesions in the kidneys bilaterally are too small to definitively characterize. In addition in the right kidney, there are 2 simple cysts, largest of which is in the medial aspect of the lower pole measuring 2.2 cm in diameter. Prominent portosystemic collateral vessels are noted along the left side of the retroperitoneum, apparently communicating between the splenic circulation and the left iliac circulation. Numerous small gastric and esophageal varices are also noted.  There use a right inguinal hernia which appears to contain a short loop of  small bowel. Small bowel proximal to this and the stomach all appear pathologically dilated, compatible with acute bowel obstruction, with bowel loops measuring up to 4.3 cm in diameter. The distal small bowel is decompressed, as is the colon. Numerous colonic diverticulae are noted. Trace volume of ascites. No frank pneumoperitoneum to suggest bowel perforation at this time. Notably, although the loops of bowel immediately adjacent to the hernia demonstrate avid mucosal enhancement, the bowel loop within the hernia demonstrates markedly decreased enhancement, suggesting incarceration and ischemia. There is a small volume of fluid within the right inguinal  hernia as well, which is low attenuation.  Musculoskeletal: Status post ORIF in the left femoral neck. There are no aggressive appearing lytic or blastic lesions noted in the visualized portions of the skeleton. T9 vertebral body compression fracture with 60% loss of anterior vertebral body height and 40% loss of posterior vertebral body height appears to be chronic.  Review of the MIP images confirms the above findings.  IMPRESSION: 1. Incarcerated right inguinal hernia containing a short loop of small bowel with evidence of decreased perfusion of the small bowel loop within the hernia, and proximal small bowel obstruction. Surgical consultation is strongly recommended. 2. No pneumoperitoneum at this time. 3. Small volume of ascites may be reactive, or could relate to underlying chronic liver disease. 4. Advanced cirrhosis, as above. 5. Colonic diverticulosis without findings to suggest acute diverticulitis at this time. 6. Multifocal peribronchovascular interstitial and airspace disease throughout the lung bases bilaterally (right greater than left), likely reflects sequela of recent aspiration. 7. Additional incidental findings, as above. These results were called by telephone at the time of interpretation on 07/13/2013 at 10:07 PM to Dr. Wendall Papa, who verbally acknowledged these results.   Electronically Signed   By: Vinnie Langton M.D.   On: 07/13/2013 22:10    Review of Systems  Constitutional: Positive for weight loss and malaise/fatigue.  HENT: Negative.   Eyes: Negative.   Respiratory: Positive for cough and shortness of breath.   Cardiovascular: Negative.   Gastrointestinal: Positive for heartburn, nausea, vomiting, abdominal pain and constipation.  Musculoskeletal: Positive for back pain.  Skin: Negative.   Neurological: Positive for weakness.  Endo/Heme/Allergies: Negative.   Psychiatric/Behavioral:       Delirium  obtained from daughters.   Blood pressure 109/54, pulse 88,  temperature 99.2 F (37.3 C), temperature source Oral, resp. rate 18, height 5' 8" (1.727 m), weight 150 lb (68.04 kg), SpO2 90.00%. Physical Exam  Constitutional: He appears well-developed. He appears listless. He appears distressed.  Cachectic   HENT:  Head: Normocephalic and atraumatic.  Eyes: Conjunctivae are normal. Pupils are equal, round, and reactive to light. No scleral icterus.  Neck: Normal range of motion. Neck supple. No tracheal deviation present. No thyromegaly present.  Cardiovascular: Normal rate, regular rhythm, normal heart sounds and intact distal pulses.  Exam reveals no gallop and no friction rub.   No murmur heard. Respiratory: Effort normal and breath sounds normal. No respiratory distress. He has no wheezes. He has no rales. He exhibits no tenderness.  GI: Soft. He exhibits no distension. There is no tenderness. There is no rebound and no guarding. A hernia is present. Hernia confirmed positive in the right inguinal area (incarcerated, tender, sl erythema ).  Genitourinary:     Musculoskeletal: He exhibits no edema and no tenderness.  Lymphadenopathy:    He has no cervical adenopathy.  Neurological: He appears listless.  Skin: Skin is warm and dry. No rash noted.  He is not diaphoretic. There is erythema (R groin). No pallor.  Psychiatric:  Very short phrases.     Assessment/Plan: Strangulated recurrent right inguinal hernia Pneumonia  Severe protein calorie malnutrition Dehydration Small bowel obstruction Hypocoagulative state  NPO NGT IVF resuscitation Nutritional support Antibiotics per [facility] consult  To OR for repair of hernia.  Pt is high risk given pneumonia, cirrhosis, severe protein calorie malnutrition, and global deconditioning pre op.  Discussed these risks with daughters.  I still think operative risk is lower than non operative care.  Bowel looks compromised on CT and patient is certainly obstructed.   We discussed code status.  Pt  and family desire no code in post op setting, but consent to temporary suspension during OR case.   Will proceed with surgery.   Will give dose of vitamin K pre op and 2 u FFP     , 07/13/2013, 11:23 PM

## 2013-07-13 NOTE — ED Notes (Signed)
Pt taken to radiology

## 2013-07-13 NOTE — ED Notes (Signed)
The pt returned from xray again.  Raymond Larsen hung

## 2013-07-14 ENCOUNTER — Inpatient Hospital Stay (HOSPITAL_COMMUNITY): Payer: Medicare HMO | Admitting: Anesthesiology

## 2013-07-14 DIAGNOSIS — I4891 Unspecified atrial fibrillation: Secondary | ICD-10-CM

## 2013-07-14 DIAGNOSIS — E43 Unspecified severe protein-calorie malnutrition: Secondary | ICD-10-CM

## 2013-07-14 LAB — CBC
HCT: 32.8 % — ABNORMAL LOW (ref 39.0–52.0)
HEMOGLOBIN: 11.4 g/dL — AB (ref 13.0–17.0)
MCH: 31.7 pg (ref 26.0–34.0)
MCHC: 34.8 g/dL (ref 30.0–36.0)
MCV: 91.1 fL (ref 78.0–100.0)
Platelets: 166 10*3/uL (ref 150–400)
RBC: 3.6 MIL/uL — AB (ref 4.22–5.81)
RDW: 15.9 % — ABNORMAL HIGH (ref 11.5–15.5)
WBC: 8 10*3/uL (ref 4.0–10.5)

## 2013-07-14 LAB — URINE MICROSCOPIC-ADD ON

## 2013-07-14 LAB — COMPREHENSIVE METABOLIC PANEL
ALBUMIN: 2.2 g/dL — AB (ref 3.5–5.2)
ALT: 10 U/L (ref 0–53)
AST: 21 U/L (ref 0–37)
Alkaline Phosphatase: 146 U/L — ABNORMAL HIGH (ref 39–117)
BUN: 30 mg/dL — ABNORMAL HIGH (ref 6–23)
CALCIUM: 8.6 mg/dL (ref 8.4–10.5)
CO2: 23 mEq/L (ref 19–32)
CREATININE: 0.91 mg/dL (ref 0.50–1.35)
Chloride: 96 mEq/L (ref 96–112)
GFR calc Af Amer: 85 mL/min — ABNORMAL LOW (ref >90)
GFR calc non Af Amer: 73 mL/min — ABNORMAL LOW (ref >90)
Glucose, Bld: 92 mg/dL (ref 70–99)
Potassium: 4.1 mEq/L (ref 3.7–5.3)
Sodium: 132 mEq/L — ABNORMAL LOW (ref 137–147)
Total Bilirubin: 1 mg/dL (ref 0.3–1.2)
Total Protein: 5.1 g/dL — ABNORMAL LOW (ref 6.0–8.3)

## 2013-07-14 LAB — URINALYSIS, ROUTINE W REFLEX MICROSCOPIC
Bilirubin Urine: NEGATIVE
GLUCOSE, UA: NEGATIVE mg/dL
KETONES UR: NEGATIVE mg/dL
Leukocytes, UA: NEGATIVE
Nitrite: NEGATIVE
Protein, ur: NEGATIVE mg/dL
Specific Gravity, Urine: >1.046 — ABNORMAL HIGH (ref 1.005–1.030)
Urobilinogen, UA: 0.2 mg/dL (ref 0.0–1.0)
pH: 6 (ref 5.0–8.0)

## 2013-07-14 LAB — GLUCOSE, CAPILLARY
Glucose-Capillary: 74 mg/dL (ref 70–99)
Glucose-Capillary: 101 mg/dL — ABNORMAL HIGH (ref 70–99)
Glucose-Capillary: 102 mg/dL — ABNORMAL HIGH (ref 70–99)

## 2013-07-14 LAB — MAGNESIUM: Magnesium: 1.9 mg/dL (ref 1.5–2.5)

## 2013-07-14 LAB — STREP PNEUMONIAE URINARY ANTIGEN: STREP PNEUMO URINARY ANTIGEN: NEGATIVE

## 2013-07-14 LAB — MRSA PCR SCREENING: MRSA by PCR: NEGATIVE

## 2013-07-14 LAB — LEGIONELLA ANTIGEN, URINE: LEGIONELLA ANTIGEN, URINE: NEGATIVE

## 2013-07-14 LAB — PHOSPHORUS: PHOSPHORUS: 3.5 mg/dL (ref 2.3–4.6)

## 2013-07-14 MED ORDER — INSULIN ASPART 100 UNIT/ML ~~LOC~~ SOLN
0.0000 [IU] | SUBCUTANEOUS | Status: DC
  Administered 2013-07-15 (×3): 1 [IU] via SUBCUTANEOUS

## 2013-07-14 MED ORDER — MAGNESIUM SULFATE IN D5W 10-5 MG/ML-% IV SOLN
1.0000 g | Freq: Once | INTRAVENOUS | Status: AC
  Administered 2013-07-14: 1 g via INTRAVENOUS
  Filled 2013-07-14: qty 100

## 2013-07-14 MED ORDER — MIRTAZAPINE 30 MG PO TABS
30.0000 mg | ORAL_TABLET | Freq: Every day | ORAL | Status: DC
  Filled 2013-07-14 (×2): qty 1

## 2013-07-14 MED ORDER — DEXTROSE 5 % IV SOLN
10.0000 mg | INTRAVENOUS | Status: DC | PRN
  Administered 2013-07-14: 50 ug/min via INTRAVENOUS

## 2013-07-14 MED ORDER — ASPIRIN EC 81 MG PO TBEC
81.0000 mg | DELAYED_RELEASE_TABLET | Freq: Every day | ORAL | Status: DC
  Filled 2013-07-14 (×2): qty 1

## 2013-07-14 MED ORDER — PROPOFOL 10 MG/ML IV BOLUS
INTRAVENOUS | Status: DC | PRN
  Administered 2013-07-14: 80 mg via INTRAVENOUS

## 2013-07-14 MED ORDER — FENTANYL CITRATE 0.05 MG/ML IJ SOLN
INTRAMUSCULAR | Status: DC | PRN
  Administered 2013-07-14: 150 ug via INTRAVENOUS

## 2013-07-14 MED ORDER — ROCURONIUM BROMIDE 100 MG/10ML IV SOLN
INTRAVENOUS | Status: DC | PRN
  Administered 2013-07-14: 30 mg via INTRAVENOUS

## 2013-07-14 MED ORDER — LIDOCAINE HCL (CARDIAC) 20 MG/ML IV SOLN
INTRAVENOUS | Status: AC
  Filled 2013-07-14: qty 5

## 2013-07-14 MED ORDER — PANTOPRAZOLE SODIUM 40 MG IV SOLR
40.0000 mg | INTRAVENOUS | Status: DC
  Administered 2013-07-14 – 2013-07-19 (×6): 40 mg via INTRAVENOUS
  Filled 2013-07-14 (×7): qty 40

## 2013-07-14 MED ORDER — SODIUM CHLORIDE 0.9 % IV SOLN
INTRAVENOUS | Status: DC | PRN
  Administered 2013-07-14: 01:00:00 via INTRAVENOUS

## 2013-07-14 MED ORDER — LIDOCAINE HCL (PF) 1 % IJ SOLN
INTRAMUSCULAR | Status: AC
  Filled 2013-07-14: qty 30

## 2013-07-14 MED ORDER — FAT EMULSION 20 % IV EMUL
240.0000 mL | INTRAVENOUS | Status: AC
  Administered 2013-07-14: 240 mL via INTRAVENOUS
  Filled 2013-07-14: qty 250

## 2013-07-14 MED ORDER — TRACE MINERALS CR-CU-F-FE-I-MN-MO-SE-ZN IV SOLN
INTRAVENOUS | Status: AC
  Administered 2013-07-14: 20:00:00 via INTRAVENOUS
  Filled 2013-07-14: qty 1000

## 2013-07-14 MED ORDER — 0.9 % SODIUM CHLORIDE (POUR BTL) OPTIME
TOPICAL | Status: DC | PRN
  Administered 2013-07-14 (×2): 1000 mL

## 2013-07-14 MED ORDER — MECLIZINE HCL 25 MG PO TABS
25.0000 mg | ORAL_TABLET | Freq: Two times a day (BID) | ORAL | Status: DC | PRN
  Filled 2013-07-14: qty 1

## 2013-07-14 MED ORDER — LIDOCAINE HCL (CARDIAC) 20 MG/ML IV SOLN
INTRAVENOUS | Status: DC | PRN
  Administered 2013-07-14: 100 mg via INTRAVENOUS

## 2013-07-14 MED ORDER — BUPIVACAINE-EPINEPHRINE (PF) 0.25% -1:200000 IJ SOLN
INTRAMUSCULAR | Status: AC
  Filled 2013-07-14: qty 30

## 2013-07-14 MED ORDER — DEXTROSE 5 % IV SOLN
1.0000 g | Freq: Three times a day (TID) | INTRAVENOUS | Status: DC
  Administered 2013-07-14 – 2013-07-18 (×13): 1 g via INTRAVENOUS
  Filled 2013-07-14 (×16): qty 1

## 2013-07-14 MED ORDER — LACTATED RINGERS IV SOLN
INTRAVENOUS | Status: DC | PRN
  Administered 2013-07-14: 01:00:00 via INTRAVENOUS

## 2013-07-14 MED ORDER — VANCOMYCIN HCL IN DEXTROSE 750-5 MG/150ML-% IV SOLN
750.0000 mg | Freq: Two times a day (BID) | INTRAVENOUS | Status: DC
  Administered 2013-07-14 – 2013-07-17 (×8): 750 mg via INTRAVENOUS
  Filled 2013-07-14 (×11): qty 150

## 2013-07-14 MED ORDER — LEVOTHYROXINE SODIUM 100 MCG PO TABS
100.0000 ug | ORAL_TABLET | Freq: Every day | ORAL | Status: DC
  Filled 2013-07-14 (×3): qty 1

## 2013-07-14 MED ORDER — PANTOPRAZOLE SODIUM 40 MG PO TBEC: 40.0000 mg | DELAYED_RELEASE_TABLET | Freq: Every day | ORAL | Status: DC

## 2013-07-14 MED ORDER — VITAMIN B-12 1000 MCG PO TABS
1000.0000 ug | ORAL_TABLET | Freq: Every day | ORAL | Status: DC
  Filled 2013-07-14 (×3): qty 1

## 2013-07-14 MED ORDER — GLYCOPYRROLATE 0.2 MG/ML IJ SOLN
INTRAMUSCULAR | Status: DC | PRN
  Administered 2013-07-14: 0.6 mg via INTRAVENOUS

## 2013-07-14 MED ORDER — LACTATED RINGERS IV SOLN
INTRAVENOUS | Status: DC | PRN
  Administered 2013-07-14 (×2): via INTRAVENOUS

## 2013-07-14 MED ORDER — GLYCOPYRROLATE 0.2 MG/ML IJ SOLN
INTRAMUSCULAR | Status: AC
  Filled 2013-07-14: qty 3

## 2013-07-14 MED ORDER — SODIUM CHLORIDE 0.9 % IV SOLN: INTRAVENOUS | Status: AC

## 2013-07-14 MED ORDER — SODIUM CHLORIDE 0.9 % IJ SOLN
10.0000 mL | Freq: Two times a day (BID) | INTRAMUSCULAR | Status: DC
  Administered 2013-07-14 – 2013-07-15 (×2): 20 mL

## 2013-07-14 MED ORDER — SUCCINYLCHOLINE CHLORIDE 20 MG/ML IJ SOLN
INTRAMUSCULAR | Status: DC | PRN
Start: 2013-07-14 — End: 2013-07-14
  Administered 2013-07-14: 140 mg via INTRAVENOUS

## 2013-07-14 MED ORDER — METOPROLOL TARTRATE 25 MG PO TABS
25.0000 mg | ORAL_TABLET | Freq: Two times a day (BID) | ORAL | Status: DC
  Filled 2013-07-14 (×4): qty 1

## 2013-07-14 MED ORDER — ACETAMINOPHEN 325 MG PO TABS: 650.0000 mg | ORAL_TABLET | Freq: Four times a day (QID) | ORAL | Status: DC | PRN

## 2013-07-14 MED ORDER — FINASTERIDE 5 MG PO TABS
5.0000 mg | ORAL_TABLET | Freq: Every day | ORAL | Status: DC
  Filled 2013-07-14 (×2): qty 1

## 2013-07-14 MED ORDER — ONDANSETRON HCL 4 MG PO TABS: 4.0000 mg | ORAL_TABLET | Freq: Four times a day (QID) | ORAL | Status: DC | PRN

## 2013-07-14 MED ORDER — ROCURONIUM BROMIDE 50 MG/5ML IV SOLN
INTRAVENOUS | Status: AC
  Filled 2013-07-14: qty 1

## 2013-07-14 MED ORDER — NEOSTIGMINE METHYLSULFATE 1 MG/ML IJ SOLN
INTRAMUSCULAR | Status: AC
  Filled 2013-07-14: qty 10

## 2013-07-14 MED ORDER — SODIUM CHLORIDE 0.9 % IV SOLN
INTRAVENOUS | Status: AC
  Administered 2013-07-14: 125 mL/h via INTRAVENOUS

## 2013-07-14 MED ORDER — ONDANSETRON HCL 4 MG/2ML IJ SOLN: 4.0000 mg | Freq: Four times a day (QID) | INTRAMUSCULAR | Status: DC | PRN

## 2013-07-14 MED ORDER — SODIUM CHLORIDE 0.9 % IJ SOLN
10.0000 mL | INTRAMUSCULAR | Status: DC | PRN
  Administered 2013-07-17 – 2013-07-20 (×2): 10 mL

## 2013-07-14 MED ORDER — ACETAMINOPHEN 650 MG RE SUPP: 650.0000 mg | Freq: Four times a day (QID) | RECTAL | Status: DC | PRN

## 2013-07-14 MED ORDER — NEOSTIGMINE METHYLSULFATE 1 MG/ML IJ SOLN
INTRAMUSCULAR | Status: DC | PRN
  Administered 2013-07-14: 4 mg via INTRAVENOUS

## 2013-07-14 MED ORDER — ENOXAPARIN SODIUM 40 MG/0.4ML ~~LOC~~ SOLN
40.0000 mg | SUBCUTANEOUS | Status: DC
  Administered 2013-07-14 – 2013-07-20 (×7): 40 mg via SUBCUTANEOUS
  Filled 2013-07-14 (×8): qty 0.4

## 2013-07-14 MED ORDER — LIDOCAINE HCL 1 % IJ SOLN
INTRAMUSCULAR | Status: DC | PRN
  Administered 2013-07-14: 01:00:00

## 2013-07-14 NOTE — Progress Notes (Signed)
PARENTERAL NUTRITION CONSULT NOTE - INITIAL  Pharmacy Consult for TPN Indication: SBO  Allergies  Allergen Reactions  . Amitriptyline Other (See Comments)    Dizziness   . Beta Adrenergic Blockers Other (See Comments)    lethargy  . Imdur [Isosorbide]     Headache   . Niacin And Related Rash       . Sulfa Antibiotics Itching, Nausea And Vomiting and Rash    Patient Measurements: Height: 5' 8"  (172.7 cm) Weight: 144 lb 13.5 oz (65.7 kg) IBW/kg (Calculated) : 68.4  Vital Signs: Temp: 98.1 F (36.7 C) (03/23 0806) Temp src: Axillary (03/23 0806) BP: 129/62 mmHg (03/23 0700) Pulse Rate: 79 (03/23 0700) Intake/Output from previous day: 03/22 0701 - 03/23 0700 In: 2865.3 [I.V.:2306.3; Blood:509; IV [id number]:07] Out: 2710 [Urine:410] Intake/Output from this shift:    Labs:  Recent Labs  07/13/13 1940 07/13/13 2232 07/14/13 0508  WBC 15.6*  --  8.0  HGB 13.1  --  11.4*  HCT 37.1*  --  32.8*  PLT 215  --  166  INR  --  1.78*  --      Recent Labs  07/13/13 1940 07/14/13 0508  NA 130* 132*  K 4.4 4.1  CL 92* 96  CO2 23 23  GLUCOSE 84 92  BUN 31* 30*  CREATININE 0.80 0.91  CALCIUM 8.8 8.6  PROT 5.9* 5.1*  ALBUMIN 2.4* 2.2*  AST 27 21  ALT 11 10  ALKPHOS 194* 146*  BILITOT 1.1 1.0   Estimated Creatinine Clearance: 52.1 ml/min (by C-G formula based on Cr of 0.91).   No results found for this basename: GLUCAP,  in the last 72 hours  Medical History: Past Medical History  Diagnosis Date  . Arthritis   . Hyperlipidemia   . Hypertension   . A-fib     Dr Wynonia Lawman; WAS on Coumadin until 04/2012  . Angina     uses NTG prn  . Pneumonia     12 /2012  . Recurrent upper respiratory infection (URI)     03/2011  . Hypothyroidism     x 10 yrs  . GERD (gastroesophageal reflux disease)   . Constipation, chronic   . Depression   . History of dizziness   . Anxiety   . Urinary frequency   . Hearing loss of both ears     wears hearing aides  . BPH with  obstruction/lower urinary tract symptoms     sees Dr Gaynelle Arabian  . Compression fracture of thoracic vertebra, non-traumatic 06/14/12    T6 and T7 (noted on CT angio chest to r/o PE)  . Alcoholic cirrhosis of liver with ascites 06/14/12    Noted on CT angio chest to r/o PE  . Esophageal varices in alcoholic cirrhosis "   "    "   "    "  . Intertrochanteric fracture of left hip 05/24/2013   Insulin Requirements in the past 24 hours:  No insulin ordered  Current Nutrition:  NPO  Nutritional Goals:  1900-2200 kCal, 80-90 grams of protein per day based on population estimate calculations.  Assessment: 53 YOM from SNF admitted with several days of N/V. With right sided hernia for quite awhile, usually soft. Found to have SBO and taken to OR for hernia repair, ex lap and small bowel resection very early this morning  GI: area of necrotic small bowel that would not reduce along with necrotic hernia sac and omentum. SBO with NGT- 629m out so far  today. Unsure of last BM  Endo: hx hypothyroidism- TSH low in Nov 2014; no hx DM, serum glucs good  Lytes: Na 132, K 4.1, Phos 3.5, Mag 1.9  Renal: SCr 0.91, est CrCl ~33m/min, UOP not charted for full 24 hours yet- 447mso far today. Currently getting NS at 12543mr  Pulm: RA  Cards: hx AFib- taken off warfarin in Janary 2014 d/t high fall risk; also with hx HTN, HLD and angina  Hepatobil: noted hx cirrhosis with ascites and esophageal varices; alk phos elevated at 146, albumin low at 2.2. Other LFTs are WNL. Note INR is 1.78 in absence of warfarin  Neuro: typically A&O, having some moments of confusion. GCS 15, RASS 0  ID: presumed HCAP- started on vanc/cefepime per Rx. D#1. WBC wnl, LA 3.35. Cultures pending  Best Practices: IV PPI, Lovenox, SCDs  TPN Access: PICC to be placed today TPN day#: 0 (starting 3/23)  Plan:  1. Start Clinimix E 5/15 at 66m40m + lipid 20% emulsion at 10mL46m this will provide 36g protein and 1008kcal  daily 2. Daily IV multivitamin; Trace elements MWF only d/t national shortage 3. CBGs and sensitive SSI q4h starting today at 1200 4. Continue MIVF as NS, but decrease rate to 100mL/51mhen TPN starts at 1800 5. Magnesium 1g IV x1 as repletion as goal for SBO is >2 6. TPN labs as ordered 7. Follow up RD recommendations and adjust as neeed  Wake Conlee D. Tristin Vandeusen, PharmD, BCPS Clinical Pharmacist Pager: [phone] 10:25 AM

## 2013-07-14 NOTE — Progress Notes (Addendum)
INITIAL NUTRITION ASSESSMENT  DOCUMENTATION CODES Per approved criteria  -Not Applicable   INTERVENTION:  TPN per pharmacy RD to follow for nutrition care plan  NUTRITION DIAGNOSIS: Inadequate oral intake related to altered GI function as evidenced by NPO status  Goal: Pt to meet >/= 90% of their estimated nutrition needs   Monitor:  TPN prescription, weight, labs, I/O's  Reason for Assessment: Consult  80 y.o. male  Admitting Dx: small bowel obstruction  ASSESSMENT: 80 y.o. male with history of HTN, hyperlipidemia, pneumonia, hypothyroidism, GERD, constipation, depression, alcoholic cirrhosis, and history of left hip fracture in January of 2015; presented with cough, nausea and vomiting; chest x-ray showed right lower lobe pneumonia versus aspiration pneumonitis.   CT of abdomen and pelvis with contrast showed incarcerated right inguinal hernia with evidence of decreased perfusion of small bowel within the hernia and proximal small bowel obstruction.   Patient s/p procedures 3/23: REPAIR RIGHT INCARCERATED FEMORAL HERNIA WITH MESH (Right)  EXPLORATORY LAPAROTOMY  SMALL BOWEL RESECTION   RD consulted for new TPN.  Patient sleeping in chair upon RD visit.  Did not wake.  No family at bedside.  Pt with mouth open. ? if pt is edentulous.   NGT to LIS.  Patient with visible temple wasting.  RD suspects some level of malnutrition.  Unable to complete full Nutrition Focused Physical Exam at this time.  Patient is receiving TPN with Clinimix E 5/15 @ 30 ml/hr and lipids @ 10 ml/hr. Provides 991 kcal and 36 grams protein per day. Meets 52% minimum estimated energy needs and 40% minimum estimated protein needs.  Height: Ht Readings from Last 1 Encounters:  07/13/13 5\' 8"  (1.727 m)    Weight: Wt Readings from Last 1 Encounters:  07/14/13 144 lb 13.5 oz (65.7 kg)    Ideal Body Weight: 154 lb  % Ideal Body Weight: 94%  Wt Readings from Last 10 Encounters:  07/14/13 144  lb 13.5 oz (65.7 kg)  07/14/13 144 lb 13.5 oz (65.7 kg)  05/26/13 155 lb (70.308 kg)  05/26/13 155 lb (70.308 kg)  05/15/13 155 lb (70.308 kg)  05/09/13 155 lb (70.308 kg)  04/30/13 152 lb (68.947 kg)  03/04/13 136 lb 3.9 oz (61.8 kg)  01/27/13 141 lb (63.957 kg)  01/22/13 138 lb 8 oz (62.823 kg)    Usual Body Weight: 155 lb -- February 2015  % Usual Body Weight: 93%  BMI:  Body mass index is 22.03 kg/(m^2).  Estimated Nutritional Needs: Kcal: 1900-2100 Protein: 90-100 gm Fluid: 1.9-2.1 L  Skin: surgical incisions   Diet Order: NPO  EDUCATION NEEDS: -No education needs identified at this time   Intake/Output Summary (Last 24 hours) at 07/14/13 1345 Last data filed at 07/14/13 1200  Gross per 24 hour  Intake 3730.25 ml  Output   3705 ml  Net  25.25 ml    Labs:   Recent Labs Lab 07/13/13 1940 07/14/13 0508  NA 130* 132*  K 4.4 4.1  CL 92* 96  CO2 23 23  BUN 31* 30*  CREATININE 0.80 0.91  CALCIUM 8.8 8.6  MG  --  1.9  PHOS  --  3.5  GLUCOSE 84 92    CBG (last 3)   Recent Labs  07/14/13 1218  GLUCAP 74    Scheduled Meds: . aspirin EC  81 mg Oral QHS  . ceFEPime (MAXIPIME) IV  1 g Intravenous 3 times per day  . enoxaparin (LOVENOX) injection  40 mg Subcutaneous Q24H  .  finasteride  5 mg Oral QHS  . insulin aspart  0-9 Units Subcutaneous 6 times per day  . levothyroxine  100 mcg Oral QAC breakfast  . metoprolol tartrate  25 mg Oral BID  . mirtazapine  30 mg Oral QHS  . pantoprazole (PROTONIX) IV  40 mg Intravenous Q24H  . vancomycin  750 mg Intravenous Q12H  . vitamin B-12  1,000 mcg Oral Daily    Continuous Infusions: . sodium chloride 125 mL/hr at 07/14/13 1200  . sodium chloride    . Marland KitchenTPN (CLINIMIX-E) Adult     And  . fat emulsion    . lactated ringers Stopped (07/14/13 1008)    Past Medical History  Diagnosis Date  . Arthritis   . Hyperlipidemia   . Hypertension   . A-fib     Dr Wynonia Lawman; WAS on Coumadin until 04/2012  .  Angina     uses NTG prn  . Pneumonia     12 /2012  . Recurrent upper respiratory infection (URI)     03/2011  . Hypothyroidism     x 10 yrs  . GERD (gastroesophageal reflux disease)   . Constipation, chronic   . Depression   . History of dizziness   . Anxiety   . Urinary frequency   . Hearing loss of both ears     wears hearing aides  . BPH with obstruction/lower urinary tract symptoms     sees Dr Gaynelle Arabian  . Compression fracture of thoracic vertebra, non-traumatic 06/14/12    T6 and T7 (noted on CT angio chest to r/o PE)  . Alcoholic cirrhosis of liver with ascites 06/14/12    Noted on CT angio chest to r/o PE  . Esophageal varices in alcoholic cirrhosis "   "    "   "    "  . Intertrochanteric fracture of left hip 05/24/2013    Past Surgical History  Procedure Laterality Date  . Total knee arthroplasty  1994    right  . Total shoulder replacement  1997    right  . Joint replacement      Rt knee and shoulder  . Hernia repair  1990    LIH repair  . Cholecystectomy  2004  . Inguinal hernia repair  05/09/2011    Procedure: HERNIA REPAIR INGUINAL ADULT;  Surgeon: Odis Hollingshead, MD;  Location: [place];  Service: General;  Laterality: Right;  . Femur im nail Left 05/25/2013    Procedure: INTRAMEDULLARY (IM) NAIL FEMORAL;  Surgeon: Johnny Bridge, MD;  Location: [facility];  Service: Orthopedics;  Laterality: Left;    Arthur Holms, RD, LDN Pager #: [phone] After-Hours Pager #: [phone]

## 2013-07-14 NOTE — Evaluation (Signed)
Physical Therapy Evaluation Patient Details Name: Raymond Kohlbeck Sr. MRN: [id number] DOB: June 28, 1922 Today's Date: 07/14/2013 Time: 4010-2725 PT Time Calculation (min): 23 min  PT Assessment / Plan / Recommendation History of Present Illness  Adm 3/22 with incarcerated Rt inguinal hernia and underwent surgical repair. 2/1 had Lt femur ORIF s/p fall. Was WBAT.  Clinical Impression  Patient is s/p Rt inguinal hernia surgery resulting in functional limitations due to the deficits listed below (see PT Problem List). Pt resident of SNF prior to recent hip fracture and will need SNF again. Patient will benefit from skilled PT to increase their independence and safety with mobility to allow discharge to the venue listed below.       PT Assessment  Patient needs continued PT services    Follow Up Recommendations  SNF    Does the patient have the potential to tolerate intense rehabilitation      Barriers to Discharge        Equipment Recommendations  None recommended by PT    Recommendations for Other Services     Frequency Min 2X/week    Precautions / Restrictions Precautions Precautions: Fall Restrictions Weight Bearing Restrictions: Yes LLE Weight Bearing: Weight bearing as tolerated   Pertinent Vitals/Pain HR 94-140 (with walking terminated due to incr HR) SaO2 94% Denied pain, however resisting ROM of bil LEs (?pain related)      Mobility  Bed Mobility Overal bed mobility: Needs Assistance Bed Mobility: Supine to Sit Supine to sit: Max assist General bed mobility comments: total assist for legs off side of bed; pt engaged trunk to assist with come to sit (from [facility] elevated position) Transfers Overall transfer level: Needs assistance Equipment used: Rolling walker (2 wheeled) Transfers: Sit to/from Stand Sit to Stand: Min assist;+2 safety/equipment;From elevated surface General transfer comment: bed elevated 2-3" due to pt's long legs Ambulation/Gait Ambulation/Gait  assistance: Min assist;+2 safety/equipment Ambulation Distance (Feet): 10 Feet Assistive device: Rolling walker (2 wheeled) Gait Pattern/deviations: Step-through pattern;Decreased stride length;Trunk flexed Gait velocity: very slow General Gait Details: gait terminated due to HR 140    Exercises     PT Diagnosis: Difficulty walking;Acute pain  PT Problem List: Decreased strength;Decreased range of motion;Decreased activity tolerance;Decreased balance;Decreased mobility;Decreased knowledge of use of DME;Cardiopulmonary status limiting activity;Pain PT Treatment Interventions: DME instruction;Gait training;Functional mobility training;Therapeutic activities;Therapeutic exercise;Balance training;Cognitive remediation;Patient/family education     PT Goals(Current goals can be found in the care plan section) Acute Rehab PT Goals Patient Stated Goal: agrees to try walking PT Goal Formulation: With patient Time For Goal Achievement: 07/28/13 Potential to Achieve Goals: Good  Visit Information  Last PT Received On: 07/14/13 Assistance Needed: +2 (lines and safety) PT/OT/SLP Co-Evaluation/Treatment: Yes Reason for Co-Treatment: For patient/therapist safety PT goals addressed during session: Mobility/safety with mobility;Balance;Proper use of DME History of Present Illness: Adm 3/22 with incarcerated Rt inguinal hernia and underwent surgical repair. 2/1 had Lt femur ORIF s/p fall. Was WBAT.       Prior [place] expects to be discharged to:: Skilled nursing facility Prior Function Level of Independence: Needs assistance Gait / Transfers Assistance Needed: Used a walker all the time Communication Communication: HOH    Cognition  Cognition Arousal/Alertness: Awake/alert Behavior During Therapy: Flat affect Overall Cognitive Status: No family/caregiver present to determine baseline cognitive functioning (Not oriented to place, time, or situation)     Extremity/Trunk Assessment Upper Extremity Assessment Upper Extremity Assessment: Defer to OT evaluation Lower Extremity Assessment Lower Extremity Assessment: Generalized weakness;RLE deficits/detail;LLE  deficits/detail RLE Deficits / Details: resists full hip and knee flexion in supine; adequate ROM for sit to stand; generally weak LLE Deficits / Details: resists full hip and knee flexion in supine; adequate ROM for sit to stand; generally weak Cervical / Trunk Assessment Cervical / Trunk Assessment: Kyphotic (h/o thoracic compression fxs)   Balance Balance Overall balance assessment: Needs assistance Sitting-balance support: No upper extremity supported;Feet supported Sitting balance-Leahy Scale: Fair Standing balance support: Bilateral upper extremity supported Standing balance-Leahy Scale: Poor General Comments General comments (skin integrity, edema, etc.): NG tube with foul, feculent drainage  End of Session PT - End of Session Equipment Utilized During Treatment: Gait belt Activity Tolerance: Treatment limited secondary to medical complications (Comment) Patient left: in chair;with call bell/phone within reach;with nursing/sitter in room Nurse Communication: Mobility status  GP     Suad Autrey 07/14/2013, 11:56 AM Pager [phone]

## 2013-07-14 NOTE — Transfer of Care (Signed)
Immediate Anesthesia Transfer of Care Note  Patient: Raymond Morrow Sr.  Procedure(s) Performed: Procedure(s): REPAIR RIGHT INCARCERATED FEMORAL HERNIA WITH MESH (Right) EXPLORATORY LAPAROTOMY (N/A) SMALL BOWEL RESECTION (N/A)  Patient Location: PACU  Anesthesia Type:General  Level of Consciousness: sedated and responds to stimulation  Airway & Oxygen Therapy: Patient Spontanous Breathing and Patient connected to face mask oxygen  Post-op Assessment: Report given to PACU RN and Post -op Vital signs reviewed and stable  Post vital signs: Reviewed and stable  Complications: No apparent anesthesia complications

## 2013-07-14 NOTE — Evaluation (Signed)
Occupational Therapy Evaluation and Discharge Patient Details Name: Raymond Grygiel Sr. MRN: [id number] DOB: 12-20-1922 Today's Date: 07/14/2013 Time: 0071-2197 OT Time Calculation (min): 26 min  OT Assessment / Plan / Recommendation History of present illness Adm 3/22 with incarcerated Rt inguinal hernia and underwent surgical repair. 2/1 had Lt femur ORIF s/p fall. Was WBAT.   Clinical Impression   This 80 yo male admitted from SNF (had been there since LLE IM Nail surgery 2/15) presents to acute OT with above. Will continue to benefit from follow up OT at SNF work on increasing independence with BADLs to decreased caregiver burden. Acute OT will sign off.    OT Assessment  All further OT needs can be met in the next venue of care    Follow Up Recommendations  SNF       Equipment Recommendations   (TBD at next venue)          Precautions / Restrictions Precautions Precautions: Fall Precaution Comments: NGtube, abdominal and right inguinal incisions Restrictions Weight Bearing Restrictions: Yes LLE Weight Bearing: Weight bearing as tolerated   Pertinent Vitals/Pain HR 94-140 (with walking terminated due to incr HR)  SaO2 94%  Denied pain, however resisting ROM of bil LEs (?pain related)     ADL  Transfers/Ambulation Related to ADLs: min A with +1 for lines and tubes with RW and VCs for safe hand placement ADL Comments: Pt currently at a Mod A for UBADLs and total A for LBADLs    OT Diagnosis: Generalized weakness  OT Problem List: Decreased strength;Decreased range of motion;Decreased activity tolerance;Impaired balance (sitting and/or standing);Decreased knowledge of use of DME or AE;Cardiopulmonary status limiting activity;Impaired UE functional use  Acute Rehab OT Goals Patient Stated Goal: agrees to try walking  Visit Information  Last OT Received On: 07/14/13 Assistance Needed: +2 (lines and safety) PT/OT/SLP Co-Evaluation/Treatment: Yes Reason for Co-Treatment:  For patient/therapist safety PT goals addressed during session: Mobility/safety with mobility;Balance;Proper use of DME OT goals addressed during session: Strengthening/ROM;Proper use of Adaptive equipment and DME History of Present Illness: Adm 3/22 with incarcerated Rt inguinal hernia and underwent surgical repair. 2/1 had Lt femur ORIF s/p fall. Was WBAT.       Prior [place] expects to be discharged to:: Skilled nursing facility Prior Function Level of Independence: Needs assistance Gait / Transfers Assistance Needed: Used a walker all the time Communication Communication: HOH         Vision/Perception Vision - History Patient Visual Report: No change from baseline   Cognition  Cognition Arousal/Alertness: Awake/alert Behavior During Therapy: Flat affect Overall Cognitive Status: No family/caregiver present to determine baseline cognitive functioning (Not oriented to place, time, or situation)    Extremity/Trunk Assessment Upper Extremity Assessment Upper Extremity Assessment: RUE deficits/detail;LUE deficits/detail RUE Deficits / Details: decreased AROM shoulder flexion RUE Coordination: decreased gross motor LUE Deficits / Details: decreased AROM shoulder flexion LUE Coordination: decreased gross motor Lower Extremity Assessment Lower Extremity Assessment: Generalized weakness;RLE deficits/detail;LLE deficits/detail RLE Deficits / Details: resists full hip and knee flexion in supine; adequate ROM for sit to stand; generally weak LLE Deficits / Details: resists full hip and knee flexion in supine; adequate ROM for sit to stand; generally weak Cervical / Trunk Assessment Cervical / Trunk Assessment: Kyphotic (h/o thoracic compression fxs)     Mobility Bed Mobility Overal bed mobility: Needs Assistance Bed Mobility: Supine to Sit Supine to sit: Max assist General bed mobility comments: total assist for legs off  side of bed; pt  engaged trunk to assist with come to sit (from [facility] elevated position) Transfers Overall transfer level: Needs assistance Equipment used: Rolling walker (2 wheeled) Transfers: Sit to/from Stand Sit to Stand: Min assist;+2 safety/equipment;From elevated surface General transfer comment: bed elevated 2-3" due to pt's long legs        Balance Balance Overall balance assessment: Needs assistance Sitting-balance support: No upper extremity supported;Feet supported Sitting balance-Leahy Scale: Fair Standing balance support: Bilateral upper extremity supported Standing balance-Leahy Scale: Poor General Comments General comments (skin integrity, edema, etc.): NG tube with foul, feculent drainage   End of Session OT - End of Session Equipment Utilized During Treatment: Gait belt;Rolling walker Activity Tolerance:  (limited by tachycardia) Patient left: in chair;with call bell/phone within reach;with nursing/sitter in room Nurse Communication: Mobility status (saw [place] walking with pt)       Almon Register [phone] 07/14/2013, 12:32 PM

## 2013-07-14 NOTE — Anesthesia Preprocedure Evaluation (Addendum)
Anesthesia Evaluation  Patient identified by MRN, date of birth, ID band Patient awake    Reviewed: Allergy & Precautions, H&P , NPO status , Patient's Chart, lab work & pertinent test results  Airway Mallampati: I TM Distance: >3 FB Neck ROM: Full    Dental  (+) Edentulous Upper, Edentulous Lower   Pulmonary pneumonia -, former smoker,          Cardiovascular hypertension, Pt. on medications + dysrhythmias Atrial Fibrillation     Neuro/Psych    GI/Hepatic   Endo/Other    Renal/GU      Musculoskeletal   Abdominal   Peds  Hematology   Anesthesia Other Findings   Reproductive/Obstetrics                          Anesthesia Physical Anesthesia Plan  ASA: III  Anesthesia Plan: General   Post-op Pain Management:    Induction: Intravenous, Rapid sequence and Cricoid pressure planned  Airway Management Planned: Oral ETT  Additional Equipment:   Intra-op Plan:   Post-operative Plan: Possible Post-op intubation/ventilation  Informed Consent: I have reviewed the patients History and Physical, chart, labs and discussed the procedure including the risks, benefits and alternatives for the proposed anesthesia with the patient or authorized representative who has indicated his/her understanding and acceptance.     Plan Discussed with: CRNA and Surgeon  Anesthesia Plan Comments:         Anesthesia Quick Evaluation

## 2013-07-14 NOTE — Progress Notes (Signed)
1 Day Post-Op  Subjective: Resting, does not offer complaint  Objective: Vital signs in last 24 hours: Temp:  [97.4 F (36.3 C)-99.8 F (37.7 C)] 98.1 F (36.7 C) (03/23 0806) Pulse Rate:  [72-95] 79 (03/23 0700) Resp:  [10-24] 15 (03/23 0700) BP: (100-129)/(47-67) 129/62 mmHg (03/23 0700) SpO2:  [90 %-99 %] 97 % (03/23 0700) Weight:  [144 lb 13.5 oz (65.7 kg)-150 lb (68.04 kg)] 144 lb 13.5 oz (65.7 kg) (03/23 0500)    Intake/Output from previous day: 03/22 0701 - 03/23 0700 In: 2865.3 [I.V.:2306.3; Blood:509; IV Piggyback:50] Out: 2710 [Urine:410] Intake/Output this shift:    General appearance: cooperative Resp: clear after cough Cardio: regular rate and rhythm GI: soft, quiet, dry staining on midline and R groin dressings  Lab Results:   Recent Labs  07/13/13 1940 07/14/13 0508  WBC 15.6* 8.0  HGB 13.1 11.4*  HCT 37.1* 32.8*  PLT 215 166   BMET  Recent Labs  07/13/13 1940 07/14/13 0508  NA 130* 132*  K 4.4 4.1  CL 92* 96  CO2 23 23  GLUCOSE 84 92  BUN 31* 30*  CREATININE 0.80 0.91  CALCIUM 8.8 8.6   PT/INR  Recent Labs  07/13/13 2232  LABPROT 20.2*  INR 1.78*   ABG No results found for this basename: PHART, PCO2, PO2, HCO3,  in the last 72 hours  Studies/Results: Dg Chest 2 View  07/13/2013   CLINICAL DATA:  Fever, abnormal EKG  EXAM: CHEST  2 VIEW  COMPARISON:  DG CHEST 1 VIEW dated 05/28/2013  FINDINGS: There is right lower lobe airspace disease. There is no other focal parenchymal opacity. No pleural effusion or pneumothorax. The heart and mediastinal contours are unremarkable.  Right shoulder arthroplasty. Osteoarthritis of the left glenohumeral joint.  IMPRESSION: Right lower lobe pneumonia. Recommend followup radiography in 4-6 weeks, to document complete resolution following adequate medical therapy. If there is not complete resolution, then recommend further evaluation with CT of the chest to exclude underlying pathology.   Electronically  Signed   By: Kathreen Devoid   On: 07/13/2013 20:24   Ct Cta Abd/pel W/cm &/or W/o Cm  07/13/2013   CLINICAL DATA:  Nausea and vomiting.  Poor p.o. intake.  EXAM: CT ANGIOGRAPHY ABDOMEN AND PELVIS WITH CONTRAST AND WITHOUT CONTRAST  TECHNIQUE: Multidetector CT imaging of the abdomen and pelvis was performed using the standard protocol during bolus administration of intravenous contrast. Multiplanar reconstructed images and MIPs were obtained and reviewed to evaluate the vascular anatomy.  CONTRAST:  137mL OMNIPAQUE IOHEXOL 350 MG/ML SOLN  COMPARISON:  CT of the abdomen and pelvis 03/09/2012.  FINDINGS: Lung Bases: Multifocal peribronchovascular interstitial and airspace disease throughout the lower lobes of the lungs bilaterally, and the dependent portion of the right middle lobe, favored to reflect sequela of aspiration with aspiration pneumonitis/pneumonia. Mild cardiomegaly. Calcifications of the mitral annulus and aortic valve. Circumferential thickening of the distal esophagus.  Abdomen/Pelvis: The liver has a markedly shrunken appearance and nodular contour, compatible with advanced cirrhosis. No definite focal hepatic lesions are identified on today's examination. Status post cholecystectomy. Pancreas appears atrophic, but there are no definite focal pancreatic lesions. Small calcifications within the spleen likely represent calcified granulomas. The appearance of the adrenal glands is unremarkable bilaterally. Numerous tiny subcentimeter low attenuation lesions in the kidneys bilaterally are too small to definitively characterize. In addition in the right kidney, there are 2 simple cysts, largest of which is in the medial aspect of the lower pole measuring 2.2  cm in diameter. Prominent portosystemic collateral vessels are noted along the left side of the retroperitoneum, apparently communicating between the splenic circulation and the left iliac circulation. Numerous small gastric and esophageal varices are  also noted.  There use a right inguinal hernia which appears to contain a short loop of small bowel. Small bowel proximal to this and the stomach all appear pathologically dilated, compatible with acute bowel obstruction, with bowel loops measuring up to 4.3 cm in diameter. The distal small bowel is decompressed, as is the colon. Numerous colonic diverticulae are noted. Trace volume of ascites. No frank pneumoperitoneum to suggest bowel perforation at this time. Notably, although the loops of bowel immediately adjacent to the hernia demonstrate avid mucosal enhancement, the bowel loop within the hernia demonstrates markedly decreased enhancement, suggesting incarceration and ischemia. There is a small volume of fluid within the right inguinal hernia as well, which is low attenuation.  Musculoskeletal: Status post ORIF in the left femoral neck. There are no aggressive appearing lytic or blastic lesions noted in the visualized portions of the skeleton. T9 vertebral body compression fracture with 60% loss of anterior vertebral body height and 40% loss of posterior vertebral body height appears to be chronic.  Review of the MIP images confirms the above findings.  IMPRESSION: 1. Incarcerated right inguinal hernia containing a short loop of small bowel with evidence of decreased perfusion of the small bowel loop within the hernia, and proximal small bowel obstruction. Surgical consultation is strongly recommended. 2. No pneumoperitoneum at this time. 3. Small volume of ascites may be reactive, or could relate to underlying chronic liver disease. 4. Advanced cirrhosis, as above. 5. Colonic diverticulosis without findings to suggest acute diverticulitis at this time. 6. Multifocal peribronchovascular interstitial and airspace disease throughout the lung bases bilaterally (right greater than left), likely reflects sequela of recent aspiration. 7. Additional incidental findings, as above. These results were called by  telephone at the time of interpretation on 07/13/2013 at 10:07 PM to Dr. Wendall Papa, who verbally acknowledged these results.   Electronically Signed   By: Vinnie Langton M.D.   On: 07/13/2013 22:10    Anti-infectives: Anti-infectives   Start     Dose/Rate Route Frequency Ordered Stop   07/14/13 1000  vancomycin (VANCOCIN) IVPB 750 mg/150 ml premix     750 mg 150 mL/hr over 60 Minutes Intravenous Every 12 hours 07/14/13 0341 07/22/13 0959   07/14/13 0331  ceFEPIme (MAXIPIME) 1 g in dextrose 5 % 50 mL IVPB     1 g 100 mL/hr over 30 Minutes Intravenous 3 times per day 07/14/13 0332 07/22/13 0559   07/13/13 2330  piperacillin-tazobactam (ZOSYN) IVPB 3.375 g  Status:  Discontinued     3.375 g 12.5 mL/hr over 240 Minutes Intravenous  Once 07/13/13 2318 07/13/13 2318   07/13/13 2330  [MAR Hold]  piperacillin-tazobactam (ZOSYN) IVPB 3.375 g     (On MAR Hold since 07/14/13 0037)   3.375 g 12.5 mL/hr over 240 Minutes Intravenous  Once 07/13/13 2318 07/14/13 0048   07/13/13 2115  vancomycin (VANCOCIN) IVPB 1000 mg/200 mL premix     1,000 mg 200 mL/hr over 60 Minutes Intravenous  Once 07/13/13 2112 07/13/13 2257      Assessment/Plan: s/p Procedure(s): REPAIR RIGHT INCARCERATED FEMORAL HERNIA WITH MESH (Right) EXPLORATORY LAPAROTOMY (N/A) SMALL BOWEL RESECTION (N/A) POD#1 Continue NGT until bowel function returns Pulmonary toilet ID - Vanc/Maxipime Mult medical issues - per hospitalist   LOS: 1 day    [facility]  E 07/14/2013

## 2013-07-14 NOTE — Progress Notes (Signed)
ANTIBIOTIC CONSULT NOTE - INITIAL  Pharmacy Consult for Vancomycin  Indication: pneumonia  Allergies  Allergen Reactions  . Amitriptyline Other (See Comments)    Dizziness   . Beta Adrenergic Blockers Other (See Comments)    lethargy  . Imdur [Isosorbide]     Headache   . Niacin And Related Rash       . Sulfa Antibiotics Itching, Nausea And Vomiting and Rash   Patient Measurements: Height: 5\' 8"  (172.7 cm) Weight: 150 lb (68.04 kg) IBW/kg (Calculated) : 68.4  Vital Signs: Temp: 97.4 F (36.3 C) (03/23 0315) Temp src: Oral (03/22 2325) BP: 125/61 mmHg (03/23 0315) Pulse Rate: 73 (03/23 0315) Intake/Output from previous day: 03/22 0701 - 03/23 0700 In: 2409 [I.V.:1900; Blood:509] Out: 2600 [Urine:300] Intake/Output from this shift: Total I/O In: 2409 [I.V.:1900; Blood:509] Out: 2600 [Urine:300; Other:2300]  Labs:  Recent Labs  07/13/13 1940  WBC 15.6*  HGB 13.1  PLT 215  CREATININE 0.80   Estimated Creatinine Clearance: 61.4 ml/min (by C-G formula based on Cr of 0.8).  Medical History: Past Medical History  Diagnosis Date  . Arthritis   . Hyperlipidemia   . Hypertension   . A-fib     Dr Wynonia Lawman; WAS on Coumadin until 04/2012  . Angina     uses NTG prn  . Pneumonia     12 /2012  . Recurrent upper respiratory infection (URI)     03/2011  . Hypothyroidism     x 10 yrs  . GERD (gastroesophageal reflux disease)   . Constipation, chronic   . Depression   . History of dizziness   . Anxiety   . Urinary frequency   . Hearing loss of both ears     wears hearing aides  . BPH with obstruction/lower urinary tract symptoms     sees Dr Gaynelle Arabian  . Compression fracture of thoracic vertebra, non-traumatic 06/14/12    T6 and T7 (noted on CT angio chest to r/o PE)  . Alcoholic cirrhosis of liver with ascites 06/14/12    Noted on CT angio chest to r/o PE  . Esophageal varices in alcoholic cirrhosis "   "    "   "    "  . Intertrochanteric fracture of left  hip 05/24/2013   Assessment: 80 y/o M s/p hernia repair/small bowel resection to start antibiotics for PNA demonstrated on CXR. WBC 15.6, renal function ok, other labs as above.   Goal of Therapy:  Vancomycin trough level 15-20 mcg/ml  Plan:  -Vancomycin 750 mg IV q12h for 8 days -Cefepime per MD for 8 days -Trend WBC, temp, renal function -Drug levels as indicated   Raymond Larsen, Raymond Larsen 07/14/2013,3:39 AM

## 2013-07-14 NOTE — Progress Notes (Signed)
Utilization Review Completed.  

## 2013-07-14 NOTE — Progress Notes (Signed)
[facility] PROGRESS NOTE  Raymond Schrager Sr. HQI:[id number] DOB: 1922/05/12 DOA: 07/13/2013 PCP: Tammi Sou, MD  Assessment/Plan: Active Problems:   Atrial fibrillation: Rate controlled. Not anticoagulated due to history of bleeding last year    Hypertension: Pressure stable.    BPH (benign prostatic hyperplasia)   Hyperlipidemia   CAD (coronary artery disease): Stable    Alcoholic cirrhosis of liver with ascites   Hypothyroidism: On Synthroid    Protein-calorie malnutrition, severe: Noted BMI of 22. Have asked nutrition to see.    Sepsis: Secondary to aspiration pneumonia. Stabilizing. Continue IV antibiotics.   HCAP (healthcare-associated pneumonia): May be more aspiration    SBO (small bowel obstruction): Status post exploratory laparotomy.    Incarcerated inguinal hernia, unilateral: Status post exploratory laparotomy. NG tube in place.    Dehydration: Secondary to nausea and vomiting. On IV fluids.    Nausea and vomiting: Second of small bowel structure in  Code Status: DO NOT RESUSCITATE  Family Communication: Left message for family  Disposition Plan: Stable. Continue step down as per surgery   Consultants:  General surgery  Procedures:  Status post exploratory laparotomy done 3/23 AM  Antibiotics:  IV vancomycin/cefepime: 3/23-present  HPI/Subjective: Patient currently somnolent  Objective: Filed Vitals:   07/14/13 1240  BP: 109/56  Pulse: 94  Temp:   Resp: 15    Intake/Output Summary (Last 24 hours) at 07/14/13 1322 Last data filed at 07/14/13 1200  Gross per 24 hour  Intake 3730.25 ml  Output   3705 ml  Net  25.25 ml   Filed Weights   07/13/13 1839 07/14/13 0500  Weight: 68.04 kg (150 lb) 65.7 kg (144 lb 13.5 oz)    Exam:   General:  Somnolent, NG tube in place  Cardiovascular: Regular rhythm, rate controlled  Respiratory: Clear to auscultation bilaterally, decreased at the bases  Abdomen: Soft, absent bowel sounds,  dressings dry  Musculoskeletal: No edema  Data Reviewed: Basic Metabolic Panel:  Recent Labs Lab 07/13/13 1940 07/14/13 0508  NA 130* 132*  K 4.4 4.1  CL 92* 96  CO2 23 23  GLUCOSE 84 92  BUN 31* 30*  CREATININE 0.80 0.91  CALCIUM 8.8 8.6  MG  --  1.9  PHOS  --  3.5   Liver Function Tests:  Recent Labs Lab 07/13/13 1940 07/14/13 0508  AST 27 21  ALT 11 10  ALKPHOS 194* 146*  BILITOT 1.1 1.0  PROT 5.9* 5.1*  ALBUMIN 2.4* 2.2*    Recent Labs Lab 07/13/13 1940  LIPASE 47   No results found for this basename: AMMONIA,  in the last 168 hours CBC:  Recent Labs Lab 07/13/13 1940 07/14/13 0508  WBC 15.6* 8.0  NEUTROABS 13.9*  --   HGB 13.1 11.4*  HCT 37.1* 32.8*  MCV 90.0 91.1  PLT 215 166   Cardiac Enzymes: No results found for this basename: CKTOTAL, CKMB, CKMBINDEX, TROPONINI,  in the last 168 hours BNP (last 3 results) No results found for this basename: PROBNP,  in the last 8760 hours CBG:  Recent Labs Lab 07/14/13 1218  GLUCAP 74    Recent Results (from the past 240 hour(s))  MRSA PCR SCREENING     Status: None   Collection Time    07/14/13  3:37 AM      Result Value Ref Range Status   MRSA by PCR NEGATIVE  NEGATIVE Final   Comment:            The GeneXpert MRSA  Assay (FDA     approved for NASAL specimens     only), is one component of a     comprehensive MRSA colonization     surveillance program. It is not     intended to diagnose MRSA     infection nor to guide or     monitor treatment for     MRSA infections.     Studies: Dg Chest 2 View  07/13/2013   CLINICAL DATA:  Fever, abnormal EKG  EXAM: CHEST  2 VIEW  COMPARISON:  DG CHEST 1 VIEW dated 05/28/2013  FINDINGS: There is right lower lobe airspace disease. There is no other focal parenchymal opacity. No pleural effusion or pneumothorax. The heart and mediastinal contours are unremarkable.  Right shoulder arthroplasty. Osteoarthritis of the left glenohumeral joint.  IMPRESSION:  Right lower lobe pneumonia. Recommend followup radiography in 4-6 weeks, to document complete resolution following adequate medical therapy. If there is not complete resolution, then recommend further evaluation with CT of the chest to exclude underlying pathology.   Electronically Signed   By: Kathreen Devoid   On: 07/13/2013 20:24   Ct Cta Abd/pel W/cm &/or W/o Cm  07/13/2013   CLINICAL DATA:  Nausea and vomiting.  Poor p.o. intake.  EXAM: CT ANGIOGRAPHY ABDOMEN AND PELVIS WITH CONTRAST AND WITHOUT CONTRAST  TECHNIQUE: Multidetector CT imaging of the abdomen and pelvis was performed using the standard protocol during bolus administration of intravenous contrast. Multiplanar reconstructed images and MIPs were obtained and reviewed to evaluate the vascular anatomy.  CONTRAST:  167mL OMNIPAQUE IOHEXOL 350 MG/ML SOLN  COMPARISON:  CT of the abdomen and pelvis 03/09/2012.  FINDINGS: Lung Bases: Multifocal peribronchovascular interstitial and airspace disease throughout the lower lobes of the lungs bilaterally, and the dependent portion of the right middle lobe, favored to reflect sequela of aspiration with aspiration pneumonitis/pneumonia. Mild cardiomegaly. Calcifications of the mitral annulus and aortic valve. Circumferential thickening of the distal esophagus.  Abdomen/Pelvis: The liver has a markedly shrunken appearance and nodular contour, compatible with advanced cirrhosis. No definite focal hepatic lesions are identified on today's examination. Status post cholecystectomy. Pancreas appears atrophic, but there are no definite focal pancreatic lesions. Small calcifications within the spleen likely represent calcified granulomas. The appearance of the adrenal glands is unremarkable bilaterally. Numerous tiny subcentimeter low attenuation lesions in the kidneys bilaterally are too small to definitively characterize. In addition in the right kidney, there are 2 simple cysts, largest of which is in the medial aspect  of the lower pole measuring 2.2 cm in diameter. Prominent portosystemic collateral vessels are noted along the left side of the retroperitoneum, apparently communicating between the splenic circulation and the left iliac circulation. Numerous small gastric and esophageal varices are also noted.  There use a right inguinal hernia which appears to contain a short loop of small bowel. Small bowel proximal to this and the stomach all appear pathologically dilated, compatible with acute bowel obstruction, with bowel loops measuring up to 4.3 cm in diameter. The distal small bowel is decompressed, as is the colon. Numerous colonic diverticulae are noted. Trace volume of ascites. No frank pneumoperitoneum to suggest bowel perforation at this time. Notably, although the loops of bowel immediately adjacent to the hernia demonstrate avid mucosal enhancement, the bowel loop within the hernia demonstrates markedly decreased enhancement, suggesting incarceration and ischemia. There is a small volume of fluid within the right inguinal hernia as well, which is low attenuation.  Musculoskeletal: Status post ORIF in the left femoral  neck. There are no aggressive appearing lytic or blastic lesions noted in the visualized portions of the skeleton. T9 vertebral body compression fracture with 60% loss of anterior vertebral body height and 40% loss of posterior vertebral body height appears to be chronic.  Review of the MIP images confirms the above findings.  IMPRESSION: 1. Incarcerated right inguinal hernia containing a short loop of small bowel with evidence of decreased perfusion of the small bowel loop within the hernia, and proximal small bowel obstruction. Surgical consultation is strongly recommended. 2. No pneumoperitoneum at this time. 3. Small volume of ascites may be reactive, or could relate to underlying chronic liver disease. 4. Advanced cirrhosis, as above. 5. Colonic diverticulosis without findings to suggest acute  diverticulitis at this time. 6. Multifocal peribronchovascular interstitial and airspace disease throughout the lung bases bilaterally (right greater than left), likely reflects sequela of recent aspiration. 7. Additional incidental findings, as above. These results were called by telephone at the time of interpretation on 07/13/2013 at 10:07 PM to Dr. Wendall Papa, who verbally acknowledged these results.   Electronically Signed   By: Vinnie Langton M.D.   On: 07/13/2013 22:10    Scheduled Meds: . aspirin EC  81 mg Oral QHS  . ceFEPime (MAXIPIME) IV  1 g Intravenous 3 times per day  . enoxaparin (LOVENOX) injection  40 mg Subcutaneous Q24H  . finasteride  5 mg Oral QHS  . insulin aspart  0-9 Units Subcutaneous 6 times per day  . levothyroxine  100 mcg Oral QAC breakfast  . magnesium sulfate 1 - 4 g bolus IVPB  1 g Intravenous Once  . metoprolol tartrate  25 mg Oral BID  . mirtazapine  30 mg Oral QHS  . pantoprazole (PROTONIX) IV  40 mg Intravenous Q24H  . vancomycin  750 mg Intravenous Q12H  . vitamin B-12  1,000 mcg Oral Daily   Continuous Infusions: . sodium chloride 125 mL/hr at 07/14/13 1200  . sodium chloride    . Marland KitchenTPN (CLINIMIX-E) Adult     And  . fat emulsion    . lactated ringers Stopped (07/14/13 1008)    Active Problems:   Atrial fibrillation   Hypertension   BPH (benign prostatic hyperplasia)   Hyperlipidemia   CAD (coronary artery disease)   Alcoholic cirrhosis of liver with ascites   Hypothyroidism   Protein-calorie malnutrition, severe   Sepsis   HCAP (healthcare-associated pneumonia)   SBO (small bowel obstruction)   Incarcerated inguinal hernia, unilateral   Dehydration   Nausea and vomiting   Pneumonia    Time spent: 20 min    [facility] Pager [phone]. If 7PM-7AM, please contact night-coverage at [email or web address], password [facility] 07/14/2013, 1:22 PM  LOS: 1 day

## 2013-07-14 NOTE — Anesthesia Procedure Notes (Signed)
Procedure Name: Intubation Date/Time: 07/14/2013 12:42 AM Performed by: Valetta Fuller Pre-anesthesia Checklist: Patient identified, Emergency Drugs available, Suction available and Patient being monitored Patient Re-evaluated:Patient Re-evaluated prior to inductionOxygen Delivery Method: Circle system utilized Preoxygenation: Pre-oxygenation with 100% oxygen Intubation Type: IV induction, Rapid sequence and Cricoid Pressure applied Laryngoscope Size: Miller and 2 Grade View: Grade I Tube type: Oral Tube size: 7.5 mm Number of attempts: 1 Airway Equipment and Method: Stylet Placement Confirmation: ETT inserted through vocal cords under direct vision,  breath sounds checked- equal and bilateral and positive ETCO2 Secured at: 23 cm Tube secured with: Tape Dental Injury: Teeth and Oropharynx as per pre-operative assessment

## 2013-07-14 NOTE — Op Note (Signed)
PRE-OPERATIVE DIAGNOSIS: strangulated recurrent right inguinal hernia  POST-OPERATIVE DIAGNOSIS:  Strangulated right femoral hernia  PROCEDURE:  Procedure(s): Repair strangulated incarcerated right femoral hernia, exploratory laparotomy with small bowel resection  SURGEON:  Surgeon(s): Stark Klein, MD  ANESTHESIA:   local and general  DRAINS: none   LOCAL MEDICATIONS USED:  MARCAINE     SPECIMEN:  Source of Specimen:  small bowel segment and necrotic hernia sac  DISPOSITION OF SPECIMEN:  PATHOLOGY  COUNTS:  YES  DICTATION: .Dragon Dictation  PLAN OF CARE: admit   PATIENT DISPOSITION:  ICU - extubated and stable.  FINDINGS:  Necrotic segment of small bowel that would not reduce. Necrotic hernia sac with necrotic omentum adherent to it.  Small bowel obstruction.  EBL:  50 mL  PROCEDURE:    Patient was identified in the holding room and taken to the operating room where he was placed supine on the operating room table.  General anesthesia was induced. A Foley catheter was placed. His abdomen was clipped, prepped, and draped in sterile fashion. Timeout was performed according to the surgical safety checklist. When we were correct we continued.  A obliquely oriented transverse incision was performed over the right inguinal hernia. The subcutaneous tissues were divided with the cautery. The hernia sac was encountered and the mesh from the previous repair was not visible at that time. The hernia sac would not reduce, and the bowel inside the hernia sac appeared necrotic. A midline incision was made in the lower abdomen. The subcutaneous tissues and fascia were opened with the cautery. The peritoneum was entered bluntly. The small bowel would not reduce manually from inside the abdomen either. The bowel is resected with the GIA-75 stapler. The LigaSure was used to divide the mesentery. The small bowel is reapproximated with 3-0 Vicryl's along with an GIA-75 and TA 60. A standard  side-to-side, functional end-to-end anastomosis was created. The bowel in the abdomen appeared to be in very good condition. There was no significant inflammation. The tissue quality appeared good. The bowel was then able to be taken from the external portion. The hernia sac was also taken as specimen because of portion of this was necrotic and was adherent to necrotic omentum.   The internal defect was closed with 0 Vicryl. The abdomen was then irrigated. Counts were checked. The peritoneum was closed in the midline with a 0 running Vicryl suture. The fascia was then closed using running #1 loop PDS sutures. The hernia defect was then addressed. This was in fact a femoral defect. Three 2-0 Prolene horizontal mattress sutures were used to close down the femoral canal.  The incisions were irrigated.  A 2-0 vicryl was used to close scarpa's fascia in the groin.  The incisions were closed with staples.  The wounds were cleaned, dried, and dressed with covaderm after local anesthesia was administered in the groin wound and the fascia of the midline wound.    Needle, sponge, and instrument counts were correct times 2.

## 2013-07-14 NOTE — ED Provider Notes (Signed)
CSN: [id number]     Arrival date & time 07/13/13  1830 History   First MD Initiated Contact with Patient 07/13/13 1834     Chief Complaint  Patient presents with  . Fever  . Abnormal ECG    HPI Raymond Talerico Sr. is a 80 y.o. male who presents complaining of abdominal pain, nausea, and vomiting.  He had fever to 100.6 at the nursing home.  The patient has had some confusion.  The history is provided by the patient and his daughters.    He has complained of nausea and vomiting over the past 3 days.  He has had greater than 10 episodes of non-bloody, non-bilious emesis.  He localizes the pain to his upper abdomen.  Pain is moderate.  Nausea is severe.  Aggravated by nothing.  Relieved by nothing.  He has had associated weakness and fatigue.    His daughters have noticed that his right inguinal hernia has slightly enlarged over the past few days, but were reassured at the nursing home that it was not contributing to his symptoms.  Today, they asked he be sent to the ED for evaluation.    On arrival, he is also noted to be hypoxic to 90% on 4 L [place].    Past Medical History  Diagnosis Date  . Arthritis   . Hyperlipidemia   . Hypertension   . A-fib     Dr Wynonia Lawman; WAS on Coumadin until 04/2012  . Angina     uses NTG prn  . Pneumonia     12 /2012  . Recurrent upper respiratory infection (URI)     03/2011  . Hypothyroidism     x 10 yrs  . GERD (gastroesophageal reflux disease)   . Constipation, chronic   . Depression   . History of dizziness   . Anxiety   . Urinary frequency   . Hearing loss of both ears     wears hearing aides  . BPH with obstruction/lower urinary tract symptoms     sees Dr Gaynelle Arabian  . Compression fracture of thoracic vertebra, non-traumatic 06/14/12    T6 and T7 (noted on CT angio chest to r/o PE)  . Alcoholic cirrhosis of liver with ascites 06/14/12    Noted on CT angio chest to r/o PE  . Esophageal varices in alcoholic cirrhosis "   "    "   "    "  .  Intertrochanteric fracture of left hip 05/24/2013   Past Surgical History  Procedure Laterality Date  . Total knee arthroplasty  1994    right  . Total shoulder replacement  1997    right  . Joint replacement      Rt knee and shoulder  . Hernia repair  1990    LIH repair  . Cholecystectomy  2004  . Inguinal hernia repair  05/09/2011    Procedure: HERNIA REPAIR INGUINAL ADULT;  Surgeon: Odis Hollingshead, MD;  Location: [facility];  Service: General;  Laterality: Right;  . Femur im nail Left 05/25/2013    Procedure: INTRAMEDULLARY (IM) NAIL FEMORAL;  Surgeon: Johnny Bridge, MD;  Location: [facility];  Service: Orthopedics;  Laterality: Left;   Family History  Problem Relation Age of Onset  . Alcoholism Father   . Cirrhosis Father    History  Substance Use Topics  . Smoking status: Former Smoker -- 0.50 packs/day for 35 years    Types: Cigarettes    Quit date: 05/02/1984  . Smokeless  tobacco: Current User    Types: Chew  . Alcohol Use: No    Review of Systems  Constitutional: Positive for fever and fatigue. Negative for chills.  HENT: Negative for congestion and rhinorrhea.   Eyes: Negative for visual disturbance.  Respiratory: Positive for cough and shortness of breath.   Cardiovascular: Negative for chest pain and leg swelling.  Gastrointestinal: Positive for nausea, vomiting and abdominal pain. Negative for diarrhea.  Genitourinary: Negative for dysuria, urgency, frequency, flank pain and difficulty urinating.  Musculoskeletal: Negative for back pain, neck pain and neck stiffness.  Skin: Negative for rash.  Neurological: Positive for weakness. Negative for syncope, numbness and headaches.  Psychiatric/Behavioral: Positive for confusion.  All other systems reviewed and are negative.      Allergies  Amitriptyline; Beta adrenergic blockers; Imdur; Niacin and related; and Sulfa antibiotics  Home Medications  No current outpatient prescriptions on file. BP 109/56  Pulse 94   Temp(Src) 98.7 F (37.1 C) (Oral)  Resp 15  Ht 5\' 8"  (1.727 m)  Wt 144 lb 13.5 oz (65.7 kg)  BMI 22.03 kg/m2  SpO2 97% Physical Exam  Nursing note and vitals reviewed. Constitutional: He appears well-developed.  Ill appearing, appears uncomfortable  HENT:  Head: Normocephalic and atraumatic.  Dry mucus membranes  Eyes: Conjunctivae and EOM are normal. Pupils are equal, round, and reactive to light. No scleral icterus.  Neck: Normal range of motion. Neck supple. No JVD present.  Cardiovascular: Normal rate, regular rhythm, normal heart sounds and intact distal pulses.  Exam reveals no gallop and no friction rub.   No murmur heard. Pulmonary/Chest: Effort normal. No respiratory distress. He has no wheezes. He has rhonchi. He has no rales.  Abdominal: He exhibits distension (mild). There is tenderness (diffuse). There is no rigidity, no rebound and no guarding. A hernia is present. Hernia confirmed positive in the right inguinal area (firm, no skin changes).  Musculoskeletal: He exhibits no edema.  Neurological: He is alert. No cranial nerve deficit. He exhibits normal muscle tone. Coordination normal.  Oriented to person and place, not time  Skin: Skin is warm and dry.    ED Course  Procedures (including critical care time) Labs Review Labs Reviewed  CBC WITH DIFFERENTIAL - Abnormal; Notable for the following:    WBC 15.6 (*)    RBC 4.12 (*)    HCT 37.1 (*)    Neutrophils Relative % 89 (*)    Neutro Abs 13.9 (*)    Lymphocytes Relative 6 (*)    All other components within normal limits  COMPREHENSIVE METABOLIC PANEL - Abnormal; Notable for the following:    Sodium 130 (*)    Chloride 92 (*)    BUN 31 (*)    Total Protein 5.9 (*)    Albumin 2.4 (*)    Alkaline Phosphatase 194 (*)    GFR calc non Af Amer 77 (*)    GFR calc Af Amer 90 (*)    All other components within normal limits  URINALYSIS, ROUTINE W REFLEX MICROSCOPIC - Abnormal; Notable for the following:     Specific Gravity, Urine >1.046 (*)    Hgb urine dipstick MODERATE (*)    All other components within normal limits  PROTIME-INR - Abnormal; Notable for the following:    Prothrombin Time 20.2 (*)    INR 1.78 (*)    All other components within normal limits  I-STAT CG4 LACTIC ACID, ED - Abnormal; Notable for the following:    Lactic Acid, Venous  3.35 (*)    All other components within normal limits  URINE CULTURE  CULTURE, BLOOD (ROUTINE X 2)  CULTURE, BLOOD (ROUTINE X 2)  GRAM STAIN  LIPASE, BLOOD   Imaging Review Dg Chest 2 View  07/13/2013   CLINICAL DATA:  Fever, abnormal EKG  EXAM: CHEST  2 VIEW  COMPARISON:  DG CHEST 1 VIEW dated 05/28/2013  FINDINGS: There is right lower lobe airspace disease. There is no other focal parenchymal opacity. No pleural effusion or pneumothorax. The heart and mediastinal contours are unremarkable.  Right shoulder arthroplasty. Osteoarthritis of the left glenohumeral joint.  IMPRESSION: Right lower lobe pneumonia. Recommend followup radiography in 4-6 weeks, to document complete resolution following adequate medical therapy. If there is not complete resolution, then recommend further evaluation with CT of the chest to exclude underlying pathology.   Electronically Signed   By: Kathreen Devoid   On: 07/13/2013 20:24   Ct Cta Abd/pel W/cm &/or W/o Cm  07/13/2013   CLINICAL DATA:  Nausea and vomiting.  Poor p.o. intake.  EXAM: CT ANGIOGRAPHY ABDOMEN AND PELVIS WITH CONTRAST AND WITHOUT CONTRAST  TECHNIQUE: Multidetector CT imaging of the abdomen and pelvis was performed using the standard protocol during bolus administration of intravenous contrast. Multiplanar reconstructed images and MIPs were obtained and reviewed to evaluate the vascular anatomy.  CONTRAST:  154mL OMNIPAQUE IOHEXOL 350 MG/ML SOLN  COMPARISON:  CT of the abdomen and pelvis 03/09/2012.  FINDINGS: Lung Bases: Multifocal peribronchovascular interstitial and airspace disease throughout the lower lobes of  the lungs bilaterally, and the dependent portion of the right middle lobe, favored to reflect sequela of aspiration with aspiration pneumonitis/pneumonia. Mild cardiomegaly. Calcifications of the mitral annulus and aortic valve. Circumferential thickening of the distal esophagus.  Abdomen/Pelvis: The liver has a markedly shrunken appearance and nodular contour, compatible with advanced cirrhosis. No definite focal hepatic lesions are identified on today's examination. Status post cholecystectomy. Pancreas appears atrophic, but there are no definite focal pancreatic lesions. Small calcifications within the spleen likely represent calcified granulomas. The appearance of the adrenal glands is unremarkable bilaterally. Numerous tiny subcentimeter low attenuation lesions in the kidneys bilaterally are too small to definitively characterize. In addition in the right kidney, there are 2 simple cysts, largest of which is in the medial aspect of the lower pole measuring 2.2 cm in diameter. Prominent portosystemic collateral vessels are noted along the left side of the retroperitoneum, apparently communicating between the splenic circulation and the left iliac circulation. Numerous small gastric and esophageal varices are also noted.  There use a right inguinal hernia which appears to contain a short loop of small bowel. Small bowel proximal to this and the stomach all appear pathologically dilated, compatible with acute bowel obstruction, with bowel loops measuring up to 4.3 cm in diameter. The distal small bowel is decompressed, as is the colon. Numerous colonic diverticulae are noted. Trace volume of ascites. No frank pneumoperitoneum to suggest bowel perforation at this time. Notably, although the loops of bowel immediately adjacent to the hernia demonstrate avid mucosal enhancement, the bowel loop within the hernia demonstrates markedly decreased enhancement, suggesting incarceration and ischemia. There is a small volume  of fluid within the right inguinal hernia as well, which is low attenuation.  Musculoskeletal: Status post ORIF in the left femoral neck. There are no aggressive appearing lytic or blastic lesions noted in the visualized portions of the skeleton. T9 vertebral body compression fracture with 60% loss of anterior vertebral body height and 40% loss of  posterior vertebral body height appears to be chronic.  Review of the MIP images confirms the above findings.  IMPRESSION: 1. Incarcerated right inguinal hernia containing a short loop of small bowel with evidence of decreased perfusion of the small bowel loop within the hernia, and proximal small bowel obstruction. Surgical consultation is strongly recommended. 2. No pneumoperitoneum at this time. 3. Small volume of ascites may be reactive, or could relate to underlying chronic liver disease. 4. Advanced cirrhosis, as above. 5. Colonic diverticulosis without findings to suggest acute diverticulitis at this time. 6. Multifocal peribronchovascular interstitial and airspace disease throughout the lung bases bilaterally (right greater than left), likely reflects sequela of recent aspiration. 7. Additional incidental findings, as above. These results were called by telephone at the time of interpretation on 07/13/2013 at 10:07 PM to Dr. Wendall Papa, who verbally acknowledged these results.   Electronically Signed   By: Vinnie Langton M.D.   On: 07/13/2013 22:10     EKG Interpretation   Date/Time:  Sunday July 13 2013 18:34:18 EDT Ventricular Rate:  91 PR Interval:  183 QRS Duration: 110 QT Interval:  386 QTC Calculation: 475 R Axis:   -24 Text Interpretation:  Sinus rhythm Consider left atrial enlargement Left  ventricular hypertrophy Nonspecific T abnrm, anterolateral leads  Borderline ST elevation, lateral leads Borderline prolonged QT interval  Confirmed by HARRISON  MD, FORREST ([id number]) on 07/13/2013 7:09:00 PM      MDM   Raymond Morrow Sr. is a 80 y.o.  male who presents from nursing home with fever, hypoxia, vomiting, abd pain.  Increased size and firmness of his R inguinal hernia.  Febrile at nursing home per EMS on their arrival.  Mild tachycardia.  Normotensive.  Hypoxic, 90% on 4L [place].  Rhonci.  Normal work of breathing.  abd mildly distended.  Firm R inguinal hernia.  Hernia and abd are tender with no peritonitis.  Labs reviewed as detailed above.  He has a leukocytosis, lactic acidosis (3.35) and mild hyponatremia.  He has elevated INR, likely 2/2 liver disease. CXR with pneumonia. Covered for HCAP with Vancomycin and Zosyn.  CT abd/pelvis demonstrates strangulated inguinal hernia. Surgery consulted and will evaluate patient.  Admitted to hospitalist.   Final diagnoses:  Sepsis  HCAP (healthcare-associated pneumonia)  Incarcerated inguinal hernia, unilateral  Liver cirrhosis      Wendall Papa, MD 07/15/13 1843

## 2013-07-14 NOTE — Anesthesia Postprocedure Evaluation (Signed)
Anesthesia Post Note  Patient: Raymond Morrow Sr.  Procedure(s) Performed: Procedure(s) (LRB): REPAIR RIGHT INCARCERATED FEMORAL HERNIA WITH MESH (Right) EXPLORATORY LAPAROTOMY (N/A) SMALL BOWEL RESECTION (N/A)  Anesthesia type: general  Patient location: PACU  Post pain: Pain level controlled  Post assessment: Patient's Cardiovascular Status Stable  Last Vitals:  Filed Vitals:   07/14/13 0315  BP: 125/61  Pulse: 73  Temp: 36.3 C  Resp: 10    Post vital signs: Reviewed and stable  Level of consciousness: sedated  Complications: No apparent anesthesia complications

## 2013-07-14 NOTE — Progress Notes (Signed)
Peripherally Inserted Central Catheter/Midline Placement  The IV Nurse has discussed with the patient and/or persons authorized to consent for the patient, the purpose of this procedure and the potential benefits and risks involved with this procedure.  The benefits include less needle sticks, lab draws from the catheter and patient may be discharged home with the catheter.  Risks include, but not limited to, infection, bleeding, blood clot (thrombus formation), and puncture of an artery; nerve damage and irregular heat beat.  Alternatives to this procedure were also discussed.  PICC/Midline Placement Documentation        Raymond Larsen 07/14/2013, 6:53 PM

## 2013-07-15 ENCOUNTER — Encounter (HOSPITAL_COMMUNITY): Payer: Self-pay | Admitting: General Surgery

## 2013-07-15 DIAGNOSIS — I2510 Atherosclerotic heart disease of native coronary artery without angina pectoris: Secondary | ICD-10-CM

## 2013-07-15 DIAGNOSIS — J690 Pneumonitis due to inhalation of food and vomit: Secondary | ICD-10-CM | POA: Diagnosis present

## 2013-07-15 LAB — CBC
HCT: 28 % — ABNORMAL LOW (ref 39.0–52.0)
HEMOGLOBIN: 9.8 g/dL — AB (ref 13.0–17.0)
MCH: 31.9 pg (ref 26.0–34.0)
MCHC: 35 g/dL (ref 30.0–36.0)
MCV: 91.2 fL (ref 78.0–100.0)
Platelets: 163 10*3/uL (ref 150–400)
RBC: 3.07 MIL/uL — ABNORMAL LOW (ref 4.22–5.81)
RDW: 16 % — ABNORMAL HIGH (ref 11.5–15.5)
WBC: 10 10*3/uL (ref 4.0–10.5)

## 2013-07-15 LAB — GLUCOSE, CAPILLARY
GLUCOSE-CAPILLARY: 114 mg/dL — AB (ref 70–99)
GLUCOSE-CAPILLARY: 125 mg/dL — AB (ref 70–99)
GLUCOSE-CAPILLARY: 145 mg/dL — AB (ref 70–99)
Glucose-Capillary: 101 mg/dL — ABNORMAL HIGH (ref 70–99)
Glucose-Capillary: 125 mg/dL — ABNORMAL HIGH (ref 70–99)
Glucose-Capillary: 98 mg/dL (ref 70–99)

## 2013-07-15 LAB — DIFFERENTIAL
Basophils Absolute: 0 10*3/uL (ref 0.0–0.1)
Basophils Relative: 0 % (ref 0–1)
EOS PCT: 6 % — AB (ref 0–5)
Eosinophils Absolute: 0.6 10*3/uL (ref 0.0–0.7)
LYMPHS PCT: 16 % (ref 12–46)
Lymphs Abs: 1.6 10*3/uL (ref 0.7–4.0)
MONOS PCT: 8 % (ref 3–12)
Monocytes Absolute: 0.8 10*3/uL (ref 0.1–1.0)
NEUTROS ABS: 6.9 10*3/uL (ref 1.7–7.7)
Neutrophils Relative %: 70 % (ref 43–77)

## 2013-07-15 LAB — MAGNESIUM: Magnesium: 2.2 mg/dL (ref 1.5–2.5)

## 2013-07-15 LAB — PREPARE FRESH FROZEN PLASMA
Unit division: 0
Unit division: 0

## 2013-07-15 LAB — COMPREHENSIVE METABOLIC PANEL
ALK PHOS: 122 U/L — AB (ref 39–117)
ALT: 9 U/L (ref 0–53)
AST: 18 U/L (ref 0–37)
Albumin: 1.9 g/dL — ABNORMAL LOW (ref 3.5–5.2)
BILIRUBIN TOTAL: 0.7 mg/dL (ref 0.3–1.2)
BUN: 31 mg/dL — ABNORMAL HIGH (ref 6–23)
CALCIUM: 8 mg/dL — AB (ref 8.4–10.5)
CHLORIDE: 100 meq/L (ref 96–112)
CO2: 24 mEq/L (ref 19–32)
Creatinine, Ser: 0.76 mg/dL (ref 0.50–1.35)
GFR calc Af Amer: >90 mL/min (ref >90)
GFR calc non Af Amer: 79 mL/min — ABNORMAL LOW (ref >90)
GLUCOSE: 120 mg/dL — AB (ref 70–99)
POTASSIUM: 3.6 meq/L — AB (ref 3.7–5.3)
SODIUM: 133 meq/L — AB (ref 137–147)
Total Protein: 4.6 g/dL — ABNORMAL LOW (ref 6.0–8.3)

## 2013-07-15 LAB — URINE CULTURE
Colony Count: NO GROWTH
Culture: NO GROWTH

## 2013-07-15 LAB — PHOSPHORUS: PHOSPHORUS: 2.1 mg/dL — AB (ref 2.3–4.6)

## 2013-07-15 LAB — TRIGLYCERIDES: Triglycerides: 80 mg/dL (ref <150)

## 2013-07-15 MED ORDER — SODIUM CHLORIDE 0.9 % IV SOLN
INTRAVENOUS | Status: DC
  Administered 2013-07-16: 05:00:00 via INTRAVENOUS

## 2013-07-15 MED ORDER — POTASSIUM CHLORIDE 10 MEQ/50ML IV SOLN
10.0000 meq | INTRAVENOUS | Status: AC
  Administered 2013-07-15 (×2): 10 meq via INTRAVENOUS
  Filled 2013-07-15 (×2): qty 50

## 2013-07-15 MED ORDER — POTASSIUM PHOSPHATE DIBASIC 3 MMOLE/ML IV SOLN
12.0000 mmol | Freq: Once | INTRAVENOUS | Status: AC
  Administered 2013-07-15: 12 mmol via INTRAVENOUS
  Filled 2013-07-15: qty 4

## 2013-07-15 MED ORDER — FAT EMULSION 20 % IV EMUL
250.0000 mL | INTRAVENOUS | Status: AC
  Administered 2013-07-15: 250 mL via INTRAVENOUS
  Filled 2013-07-15: qty 250

## 2013-07-15 MED ORDER — MORPHINE SULFATE 2 MG/ML IJ SOLN
2.0000 mg | INTRAMUSCULAR | Status: DC | PRN
Start: 2013-07-15 — End: 2013-07-21
  Administered 2013-07-15 – 2013-07-17 (×3): 2 mg via INTRAVENOUS
  Filled 2013-07-15 (×3): qty 1

## 2013-07-15 MED ORDER — M.V.I. ADULT IV INJ
INTRAVENOUS | Status: AC
  Administered 2013-07-15: 17:00:00 via INTRAVENOUS
  Filled 2013-07-15: qty 1000

## 2013-07-15 MED ORDER — LEVOTHYROXINE SODIUM 100 MCG IV SOLR
50.0000 ug | Freq: Every day | INTRAVENOUS | Status: DC
  Administered 2013-07-15 – 2013-07-19 (×5): 50 ug via INTRAVENOUS
  Filled 2013-07-15 (×8): qty 5

## 2013-07-15 NOTE — Progress Notes (Signed)
[facility] PROGRESS NOTE  Raymond Larsen. HAL:[id number] DOB: Jun 28, 1922 DOA: 07/13/2013 PCP: Tammi Sou, MD  Assessment/Plan: Active Problems:   Atrial fibrillation: Rate controlled. Not anticoagulated due to history of bleeding last year    Hypertension: Pressure stable.    BPH (benign prostatic hyperplasia)   Hyperlipidemia   CAD (coronary artery disease): Stable    Alcoholic cirrhosis of liver with ascites   Hypothyroidism: On Synthroid-change to IV    Protein-calorie malnutrition, severe: Noted BMI of 22. Have asked nutrition to see.    Sepsis: Secondary to aspiration pneumonia. Stabilizing. Continue IV antibiotics.   HCAP (healthcare-associated pneumonia): May be more aspiration    SBO (small bowel obstruction): Status post exploratory laparotomy. Continue bowel rest with NG tube. Surgery managing.    Incarcerated inguinal hernia, unilateral: Status post exploratory laparotomy. NG tube in place.    Dehydration: Secondary to nausea and vomiting. On IV fluids.    Nausea and vomiting: Second of small bowel structure in  Code Status: DO NOT RESUSCITATE  Family Communication: Left message for family  Disposition Plan: Improving. Transfer to floor   Consultants:  General surgery  Procedures:  Status post exploratory laparotomy done 3/23 AM  Antibiotics:  IV vancomycin/cefepime: 3/23-present  HPI/Subjective: Patient interactive, no complaints. Does not really recall what happened.  Objective: Filed Vitals:   07/15/13 1500  BP: 119/53  Pulse: 87  Temp: 97.7 F (36.5 C)  Resp: 14    Intake/Output Summary (Last 24 hours) at 07/15/13 1700 Last data filed at 07/15/13 1600  Gross per 24 hour  Intake 3784.5 ml  Output    905 ml  Net 2879.5 ml   Filed Weights   07/13/13 1839 07/14/13 0500 07/15/13 0500  Weight: 68.04 kg (150 lb) 65.7 kg (144 lb 13.5 oz) 68.1 kg (150 lb 2.1 oz)    Exam:   General:  Somnolent, NG tube in  place  Cardiovascular: Regular rhythm, rate controlled  Respiratory: Few coarse breath sounds  Abdomen: Soft, absent bowel sounds, dressings dry  Musculoskeletal: No edema  Data Reviewed: Basic Metabolic Panel:  Recent Labs Lab 07/13/13 1940 07/14/13 0508 07/15/13 0425  NA 130* 132* 133*  K 4.4 4.1 3.6*  CL 92* 96 100  CO2 23 23 24   GLUCOSE 84 92 120*  BUN 31* 30* 31*  CREATININE 0.80 0.91 0.76  CALCIUM 8.8 8.6 8.0*  MG  --  1.9 2.2  PHOS  --  3.5 2.1*   Liver Function Tests:  Recent Labs Lab 07/13/13 1940 07/14/13 0508 07/15/13 0425  AST 27 21 18   ALT 11 10 9   ALKPHOS 194* 146* 122*  BILITOT 1.1 1.0 0.7  PROT 5.9* 5.1* 4.6*  ALBUMIN 2.4* 2.2* 1.9*    Recent Labs Lab 07/13/13 1940  LIPASE 47   No results found for this basename: AMMONIA,  in the last 168 hours CBC:  Recent Labs Lab 07/13/13 1940 07/14/13 0508 07/15/13 0425  WBC 15.6* 8.0 10.0  NEUTROABS 13.9*  --  6.9  HGB 13.1 11.4* 9.8*  HCT 37.1* 32.8* 28.0*  MCV 90.0 91.1 91.2  PLT 215 166 163   Cardiac Enzymes: No results found for this basename: CKTOTAL, CKMB, CKMBINDEX, TROPONINI,  in the last 168 hours BNP (last 3 results) No results found for this basename: PROBNP,  in the last 8760 hours CBG:  Recent Labs Lab 07/14/13 1958 07/14/13 2340 07/15/13 0410 07/15/13 0727 07/15/13 1155  GLUCAP 102* 145* 125* 114* 125*    Recent Results (from  the past 240 hour(s))  CULTURE, BLOOD (ROUTINE X 2)     Status: None   Collection Time    07/13/13  8:40 PM      Result Value Ref Range Status   Specimen Description BLOOD RIGHT ARM   Final   Special Requests BOTTLES DRAWN AEROBIC AND ANAEROBIC 10CC EACH   Final   Culture  Setup Time     Final   Value: 07/14/2013 01:55     Performed at [facility]   Culture     Final   Value:        BLOOD CULTURE RECEIVED NO GROWTH TO DATE CULTURE WILL BE HELD FOR 5 DAYS BEFORE ISSUING A FINAL NEGATIVE REPORT     Performed at [facility]   Report Status PENDING   Incomplete  CULTURE, BLOOD (ROUTINE X 2)     Status: None   Collection Time    07/13/13  8:45 PM      Result Value Ref Range Status   Specimen Description BLOOD RIGHT HAND   Final   Special Requests BOTTLES DRAWN AEROBIC ONLY 10CC   Final   Culture  Setup Time     Final   Value: 07/14/2013 01:55     Performed at [facility]   Culture     Final   Value:        BLOOD CULTURE RECEIVED NO GROWTH TO DATE CULTURE WILL BE HELD FOR 5 DAYS BEFORE ISSUING A FINAL NEGATIVE REPORT     Performed at [facility]   Report Status PENDING   Incomplete  URINE CULTURE     Status: None   Collection Time    07/14/13  3:35 AM      Result Value Ref Range Status   Specimen Description URINE, CATHETERIZED   Final   Special Requests NONE   Final   Culture  Setup Time     Final   Value: 07/14/2013 10:14     Performed at [facility]     Final   Value: NO GROWTH     Performed at [facility]   Culture     Final   Value: NO GROWTH     Performed at [facility]   Report Status 07/15/2013 FINAL   Final  MRSA PCR SCREENING     Status: None   Collection Time    07/14/13  3:37 AM      Result Value Ref Range Status   MRSA by PCR NEGATIVE  NEGATIVE Final   Comment:            The GeneXpert MRSA Assay (FDA     approved for NASAL specimens     only), is one component of a     comprehensive MRSA colonization     surveillance program. It is not     intended to diagnose MRSA     infection nor to guide or     monitor treatment for     MRSA infections.     Studies: Dg Chest 2 View  07/13/2013   CLINICAL DATA:  Fever, abnormal EKG  EXAM: CHEST  2 VIEW  COMPARISON:  DG CHEST 1 VIEW dated 05/28/2013  FINDINGS: There is right lower lobe airspace disease. There is no other focal parenchymal opacity. No pleural effusion or pneumothorax. The heart and mediastinal contours are unremarkable.  Right shoulder arthroplasty.  Osteoarthritis of the left glenohumeral joint.  IMPRESSION: Right lower lobe pneumonia. Recommend followup radiography in 4-6 weeks, to document complete resolution following adequate medical therapy. If there is not complete resolution, then recommend further evaluation with CT of the chest to exclude underlying pathology.   Electronically Signed   By: Kathreen Devoid   On: 07/13/2013 20:24   Ct Cta Abd/pel W/cm &/or W/o Cm  07/13/2013   CLINICAL DATA:  Nausea and vomiting.  Poor p.o. intake.  EXAM: CT ANGIOGRAPHY ABDOMEN AND PELVIS WITH CONTRAST AND WITHOUT CONTRAST  TECHNIQUE: Multidetector CT imaging of the abdomen and pelvis was performed using the standard protocol during bolus administration of intravenous contrast. Multiplanar reconstructed images and MIPs were obtained and reviewed to evaluate the vascular anatomy.  CONTRAST:  138mL OMNIPAQUE IOHEXOL 350 MG/ML SOLN  COMPARISON:  CT of the abdomen and pelvis 03/09/2012.  FINDINGS: Lung Bases: Multifocal peribronchovascular interstitial and airspace disease throughout the lower lobes of the lungs bilaterally, and the dependent portion of the right middle lobe, favored to reflect sequela of aspiration with aspiration pneumonitis/pneumonia. Mild cardiomegaly. Calcifications of the mitral annulus and aortic valve. Circumferential thickening of the distal esophagus.  Abdomen/Pelvis: The liver has a markedly shrunken appearance and nodular contour, compatible with advanced cirrhosis. No definite focal hepatic lesions are identified on today's examination. Status post cholecystectomy. Pancreas appears atrophic, but there are no definite focal pancreatic lesions. Small calcifications within the spleen likely represent calcified granulomas. The appearance of the adrenal glands is unremarkable bilaterally. Numerous tiny subcentimeter low attenuation lesions in the kidneys bilaterally are too small to definitively characterize. In addition in the right kidney, there  are 2 simple cysts, largest of which is in the medial aspect of the lower pole measuring 2.2 cm in diameter. Prominent portosystemic collateral vessels are noted along the left side of the retroperitoneum, apparently communicating between the splenic circulation and the left iliac circulation. Numerous small gastric and esophageal varices are also noted.  There use a right inguinal hernia which appears to contain a short loop of small bowel. Small bowel proximal to this and the stomach all appear pathologically dilated, compatible with acute bowel obstruction, with bowel loops measuring up to 4.3 cm in diameter. The distal small bowel is decompressed, as is the colon. Numerous colonic diverticulae are noted. Trace volume of ascites. No frank pneumoperitoneum to suggest bowel perforation at this time. Notably, although the loops of bowel immediately adjacent to the hernia demonstrate avid mucosal enhancement, the bowel loop within the hernia demonstrates markedly decreased enhancement, suggesting incarceration and ischemia. There is a small volume of fluid within the right inguinal hernia as well, which is low attenuation.  Musculoskeletal: Status post ORIF in the left femoral neck. There are no aggressive appearing lytic or blastic lesions noted in the visualized portions of the skeleton. T9 vertebral body compression fracture with 60% loss of anterior vertebral body height and 40% loss of posterior vertebral body height appears to be chronic.  Review of the MIP images confirms the above findings.  IMPRESSION: 1. Incarcerated right inguinal hernia containing a short loop of small bowel with evidence of decreased perfusion of the small bowel loop within the hernia, and proximal small bowel obstruction. Surgical consultation is strongly recommended. 2. No pneumoperitoneum at this time. 3. Small volume of ascites may be reactive, or could relate to underlying chronic liver disease. 4. Advanced cirrhosis, as above. 5.  Colonic diverticulosis without findings to suggest acute diverticulitis at this time. 6. Multifocal peribronchovascular interstitial and airspace disease throughout the lung  bases bilaterally (right greater than left), likely reflects sequela of recent aspiration. 7. Additional incidental findings, as above. These results were called by telephone at the time of interpretation on 07/13/2013 at 10:07 PM to Dr. Wendall Papa, who verbally acknowledged these results.   Electronically Signed   By: Vinnie Langton M.D.   On: 07/13/2013 22:10    Scheduled Meds: . ceFEPime (MAXIPIME) IV  1 g Intravenous 3 times per day  . enoxaparin (LOVENOX) injection  40 mg Subcutaneous Q24H  . insulin aspart  0-9 Units Subcutaneous 6 times per day  . levothyroxine  50 mcg Intravenous QAC breakfast  . pantoprazole (PROTONIX) IV  40 mg Intravenous Q24H  . potassium phosphate IVPB (mmol)  12 mmol Intravenous Once  . sodium chloride  10-40 mL Intracatheter Q12H  . vancomycin  750 mg Intravenous Q12H  . vitamin B-12  1,000 mcg Oral Daily   Continuous Infusions: . sodium chloride 100 mL/hr at 07/15/13 1600  . sodium chloride    . Marland KitchenTPN (CLINIMIX-E) Adult 30 mL/hr at 07/15/13 1300   And  . fat emulsion 240 mL (07/15/13 1300)  . Marland KitchenTPN (CLINIMIX-E) Adult     And  . fat emulsion    . lactated ringers Stopped (07/14/13 1008)    Active Problems:   Atrial fibrillation   Hypertension   BPH (benign prostatic hyperplasia)   Hyperlipidemia   CAD (coronary artery disease)   Alcoholic cirrhosis of liver with ascites   Hypothyroidism   Protein-calorie malnutrition, severe   Sepsis   HCAP (healthcare-associated pneumonia)   SBO (small bowel obstruction)   Incarcerated inguinal hernia, unilateral   Dehydration   Nausea and vomiting   Pneumonia    Time spent: 25 min    [facility] Pager [phone]. If 7PM-7AM, please contact night-coverage at [email or web address], password [facility] 07/15/2013, 5:00  PM  LOS: 2 days

## 2013-07-15 NOTE — ED Provider Notes (Signed)
Medical screening examination/treatment/procedure(s) were conducted as a shared visit with resident physician and myself.  I personally evaluated the patient during the encounter.   EKG Interpretation  Date/Time:  Sunday July 13 2013 18:53:20 EDT Ventricular Rate:  91 PR Interval:  188 QRS Duration: 118 QT Interval:  387 QTC Calculation: 476 R Axis:   -20 Text Interpretation:  Sinus rhythm Left ventricular hypertrophy Inferior infarct, acute (RCA) Lateral leads are also involved Probable RV involvement, suggest recording right precordial leads ED PHYSICIAN INTERPRETATION AVAILABLE IN [place] Confirmed by TEST, Record ([id number]) on 07/15/2013 10:03:44 AM      I interviewed and examined the patient. Lungs w/ mild rhonchi bilaterally. Cardiac exam wnl. Abdomen soft w/ firm mild to moderate right inguinal hernia. Diffuse mild ttp of abdomen and hernia. CT c/w strangulated hernia. GSU consulted. Plan to admit to hospitalist.   Blanchard Kelch, MD 07/15/13 2025

## 2013-07-15 NOTE — Progress Notes (Signed)
Pt transferred to the unit. Pt is stable, alert and oriented per baseline. Oriented to room, staff, and call bell. Educated to call for any assistance. Bed in lowest position, call bell within reach- will continue to monitor. 

## 2013-07-15 NOTE — Progress Notes (Signed)
Pt refused to get out of bed to chair stating "I am too tired today".

## 2013-07-15 NOTE — Progress Notes (Signed)
PARENTERAL NUTRITION CONSULT NOTE - FOLLOW UP  Pharmacy Consult for TPN Indication: SBO  Allergies  Allergen Reactions  . Amitriptyline Other (See Comments)    Dizziness   . Beta Adrenergic Blockers Other (See Comments)    lethargy  . Imdur [Isosorbide]     Headache   . Niacin And Related Rash       . Sulfa Antibiotics Itching, Nausea And Vomiting and Rash    Patient Measurements: Height: _0  (172.7 cm) Weight: 150 lb 2.1 oz (68.1 kg) IBW/kg (Calculated) : 68.4  Vital Signs: Temp: 98.1 F (36.7 C) (03/24 0724) Temp src: Oral (03/24 0724) BP: 138/57 mmHg (03/24 0800) Pulse Rate: 86 (03/24 0800) Intake/Output from previous day: 03/23 0701 - 03/24 0700 In: 3652.5 [I.V.:2562.5; NG/GT:210; IV Piggyback:400; TPN:480] Out: 1668 [Urine:518; Emesis/NG output:1150] Intake/Output from this shift: Total I/O In: 650 [I.V.:300; NG/GT:30; IV Piggyback:200; TPN:120] Out: 135 [Urine:135]  Labs:  Recent Labs  07/13/13 1940 07/13/13 2232 07/14/13 0508 07/15/13 0425  WBC 15.6*  --  8.0 10.0  HGB 13.1  --  11.4* 9.8*  HCT 37.1*  --  32.8* 28.0*  PLT 215  --  166 163  INR  --  1.78*  --   --      Recent Labs  07/13/13 1940 07/14/13 0508 07/15/13 0425  NA 130* 132* 133*  K 4.4 4.1 3.6*  CL 92* 96 100  CO2 _1 GLUCOSE 84 92 120*  BUN 31* 30* 31*  CREATININE 0.80 0.91 0.76  CALCIUM 8.8 8.6 8.0*  MG  --  1.9 2.2  PHOS  --  3.5 2.1*  PROT 5.9* 5.1* 4.6*  ALBUMIN 2.4* 2.2* 1.9*  AST _2 ALT _3 ALKPHOS 194* 146* 122*  BILITOT 1.1 1.0 0.7  TRIG  --   --  80   Estimated Creatinine Clearance: 61.5 ml/min (by C-G formula based on Cr of 0.76).    Recent Labs  07/14/13 2340 07/15/13 0410 07/15/13 0727  GLUCAP 145* 125* 114*    Insulin Requirements in the past 24 hours:  2 units Novolog  Current Nutrition:  Clinimix E 5/15 at 30 ml/hr + 20% lipid emulsion at 10 ml/hr - provides 36 gm protein and 991 kCal per day  Assessment: 53 YOM  from SNF admitted with several days of N/V. With right sided hernia for quite awhile, usually soft. Found to have SBO and taken to OR for hernia repair, ex lap and small bowel resection very early this morning   GI: area of necrotic small bowel that would not reduce along with necrotic hernia sac and omentum. SBO with NGT- 1150 ml out in past 24h. Unsure of last BM   Endo: hx hypothyroidism- TSH low in Nov 2014; no hx DM, CBGs well-controlled with SSI   Lytes: Na 133, K 3.6, Phos 2.1, Mag 2.2.  With declining K and Phos I am concerned about refeeding and will advance his TPN slowly.  Renal: SCr 0.76, UOP 0.3 ml/kg/hr but appears to be increasing today. Currently getting NS at 190m/hr   Pulm: 4L [place]   Cards: hx AFib- taken off warfarin in Janary 2014 d/t high fall risk; also with hx HTN, HLD and angina; VSS, no pressors  Hepatobil: noted hx cirrhosis with ascites and esophageal varices; alk phos elevated at 122, albumin low at 1.9. Other LFTs are WNL. Note INR is 1.78 in absence of warfarin.  Triglycerides 80, Prealbumin pending.  Neuro: typically  A&O, having some moments of confusion. GCS 14  ID: presumed HCAP- started on vanc/cefepime per Rx. D#2. WBC wnl, LA 3.35. Cultures pending   Best Practices: IV PPI, Lovenox, SCDs   TPN Access: PICC placed 3/23   TPN day#: 2 (started 3/23)  Nutritional Goals:  1900-2100 kCal, 90-100 grams of protein per day  Plan:  Increase Clinimix E 5/15 to 60m/hr + 20% lipid emulsion at 167mhr- this will provide 48 gm protein and 1162 kCal per day Daily IV multivitamin; Trace elements MWF only d/t national shortage  Continue CBGs and sensitive SSI q4h Continue MIVF as NS, but decrease rate to 85 mL/hr when TPN rate increases at 1800 Give potassium phosphate 12 mmol IV x 1, this will also provide 18 mEq of K. Give 2 additional runs of KCL (total 38 mEq K between KCL runs and K Phos). CMET, Mag, Phos with AM labs   MiLegrand ComoPharm.D., BCPS,  AAHIVP Clinical Pharmacist Phone: [phone]/24/2015, 10:23 AM

## 2013-07-15 NOTE — Progress Notes (Signed)
Pt transferred via bed, O2 and monitor.  VSS.  Pt transferred to 5W15.  Pt daughter called and updated.

## 2013-07-15 NOTE — Progress Notes (Signed)
Report called to Sydell Axon, RN on 5W.  Pt information and status provided.  All questions answered.

## 2013-07-15 NOTE — Progress Notes (Signed)
2 Days Post-Op  Subjective: Afebrile, nl WBC.  Low urine output.  C/o abdominal pain.  No subjective nausea/flatus.  NG output 875mL/24 hours.    Objective: Vital signs in last 24 hours: Temp:  [97.5 F (36.4 C)-98.7 F (37.1 C)] 98.2 F (36.8 C) (03/24 0411) Pulse Rate:  [80-97] 83 (03/24 0600) Resp:  [13-32] 13 (03/24 0600) BP: (90-139)/(43-65) 107/48 mmHg (03/24 0600) SpO2:  [95 %-100 %] 100 % (03/24 0600) Weight:  [150 lb 2.1 oz (68.1 kg)] 150 lb 2.1 oz (68.1 kg) (03/24 0500)    Intake/Output from previous day: 03/23 0701 - 03/24 0700 In: 3512.5 [I.V.:2462.5; NG/GT:210; IV Piggyback:400; TPN:440] Out: 1368 [Urine:518; Emesis/NG output:850] Intake/Output this shift:     PE: Gen:  Alert, NAD, pleasant Card:  RRR, no M/G/R heard Pulm:  CTA, no W/R/R Abd: Soft, mildly tender, ND, diminished BS, no HSM, midline and RLQ incisions C/D with minimal sanguinous drainage on the bandage, NG output 844mL/24 hours Ext:  No erythema, or tenderness, mild dependent edema b/l  Lab Results:   Recent Labs  07/14/13 0508 07/15/13 0425  WBC 8.0 10.0  HGB 11.4* 9.8*  HCT 32.8* 28.0*  PLT 166 163   BMET  Recent Labs  07/14/13 0508 07/15/13 0425  NA 132* 133*  K 4.1 3.6*  CL 96 100  CO2 23 24  GLUCOSE 92 120*  BUN 30* 31*  CREATININE 0.91 0.76  CALCIUM 8.6 8.0*   PT/INR  Recent Labs  07/13/13 2232  LABPROT 20.2*  INR 1.78*   CMP     Component Value Date/Time   NA 133* 07/15/2013 0425   K 3.6* 07/15/2013 0425   CL 100 07/15/2013 0425   CO2 24 07/15/2013 0425   GLUCOSE 120* 07/15/2013 0425   BUN 31* 07/15/2013 0425   CREATININE 0.76 07/15/2013 0425   CREATININE 0.85 07/25/2012 1619   CALCIUM 8.0* 07/15/2013 0425   PROT 4.6* 07/15/2013 0425   ALBUMIN 1.9* 07/15/2013 0425   AST 18 07/15/2013 0425   ALT 9 07/15/2013 0425   ALKPHOS 122* 07/15/2013 0425   BILITOT 0.7 07/15/2013 0425   GFRNONAA 79* 07/15/2013 0425   GFRAA >90 07/15/2013 0425   Lipase     Component Value  Date/Time   LIPASE 47 07/13/2013 1940       Studies/Results: Dg Chest 2 View  07/13/2013   CLINICAL DATA:  Fever, abnormal EKG  EXAM: CHEST  2 VIEW  COMPARISON:  DG CHEST 1 VIEW dated 05/28/2013  FINDINGS: There is right lower lobe airspace disease. There is no other focal parenchymal opacity. No pleural effusion or pneumothorax. The heart and mediastinal contours are unremarkable.  Right shoulder arthroplasty. Osteoarthritis of the left glenohumeral joint.  IMPRESSION: Right lower lobe pneumonia. Recommend followup radiography in 4-6 weeks, to document complete resolution following adequate medical therapy. If there is not complete resolution, then recommend further evaluation with CT of the chest to exclude underlying pathology.   Electronically Signed   By: Kathreen Devoid   On: 07/13/2013 20:24   Ct Cta Abd/pel W/cm &/or W/o Cm  07/13/2013   CLINICAL DATA:  Nausea and vomiting.  Poor p.o. intake.  EXAM: CT ANGIOGRAPHY ABDOMEN AND PELVIS WITH CONTRAST AND WITHOUT CONTRAST  TECHNIQUE: Multidetector CT imaging of the abdomen and pelvis was performed using the standard protocol during bolus administration of intravenous contrast. Multiplanar reconstructed images and MIPs were obtained and reviewed to evaluate the vascular anatomy.  CONTRAST:  150mL OMNIPAQUE IOHEXOL 350 MG/ML SOLN  COMPARISON:  CT of the abdomen and pelvis 03/09/2012.  FINDINGS: Lung Bases: Multifocal peribronchovascular interstitial and airspace disease throughout the lower lobes of the lungs bilaterally, and the dependent portion of the right middle lobe, favored to reflect sequela of aspiration with aspiration pneumonitis/pneumonia. Mild cardiomegaly. Calcifications of the mitral annulus and aortic valve. Circumferential thickening of the distal esophagus.  Abdomen/Pelvis: The liver has a markedly shrunken appearance and nodular contour, compatible with advanced cirrhosis. No definite focal hepatic lesions are identified on today's  examination. Status post cholecystectomy. Pancreas appears atrophic, but there are no definite focal pancreatic lesions. Small calcifications within the spleen likely represent calcified granulomas. The appearance of the adrenal glands is unremarkable bilaterally. Numerous tiny subcentimeter low attenuation lesions in the kidneys bilaterally are too small to definitively characterize. In addition in the right kidney, there are 2 simple cysts, largest of which is in the medial aspect of the lower pole measuring 2.2 cm in diameter. Prominent portosystemic collateral vessels are noted along the left side of the retroperitoneum, apparently communicating between the splenic circulation and the left iliac circulation. Numerous small gastric and esophageal varices are also noted.  There use a right inguinal hernia which appears to contain a short loop of small bowel. Small bowel proximal to this and the stomach all appear pathologically dilated, compatible with acute bowel obstruction, with bowel loops measuring up to 4.3 cm in diameter. The distal small bowel is decompressed, as is the colon. Numerous colonic diverticulae are noted. Trace volume of ascites. No frank pneumoperitoneum to suggest bowel perforation at this time. Notably, although the loops of bowel immediately adjacent to the hernia demonstrate avid mucosal enhancement, the bowel loop within the hernia demonstrates markedly decreased enhancement, suggesting incarceration and ischemia. There is a small volume of fluid within the right inguinal hernia as well, which is low attenuation.  Musculoskeletal: Status post ORIF in the left femoral neck. There are no aggressive appearing lytic or blastic lesions noted in the visualized portions of the skeleton. T9 vertebral body compression fracture with 60% loss of anterior vertebral body height and 40% loss of posterior vertebral body height appears to be chronic.  Review of the MIP images confirms the above findings.   IMPRESSION: 1. Incarcerated right inguinal hernia containing a short loop of small bowel with evidence of decreased perfusion of the small bowel loop within the hernia, and proximal small bowel obstruction. Surgical consultation is strongly recommended. 2. No pneumoperitoneum at this time. 3. Small volume of ascites may be reactive, or could relate to underlying chronic liver disease. 4. Advanced cirrhosis, as above. 5. Colonic diverticulosis without findings to suggest acute diverticulitis at this time. 6. Multifocal peribronchovascular interstitial and airspace disease throughout the lung bases bilaterally (right greater than left), likely reflects sequela of recent aspiration. 7. Additional incidental findings, as above. These results were called by telephone at the time of interpretation on 07/13/2013 at 10:07 PM to Dr. Wendall Papa, who verbally acknowledged these results.   Electronically Signed   By: Vinnie Langton M.D.   On: 07/13/2013 22:10    Anti-infectives: Anti-infectives   Start     Dose/Rate Route Frequency Ordered Stop   07/14/13 1000  vancomycin (VANCOCIN) IVPB 750 mg/150 ml premix     750 mg 150 mL/hr over 60 Minutes Intravenous Every 12 hours 07/14/13 0341 07/22/13 0959   07/14/13 0331  ceFEPIme (MAXIPIME) 1 g in dextrose 5 % 50 mL IVPB     1 g 100 mL/hr over 30 Minutes  Intravenous 3 times per day 07/14/13 0332 07/22/13 0559   07/13/13 2330  piperacillin-tazobactam (ZOSYN) IVPB 3.375 g  Status:  Discontinued     3.375 g 12.5 mL/hr over 240 Minutes Intravenous  Once 07/13/13 2318 07/13/13 2318   07/13/13 2330  [MAR Hold]  piperacillin-tazobactam (ZOSYN) IVPB 3.375 g     (On MAR Hold since 07/14/13 0037)   3.375 g 12.5 mL/hr over 240 Minutes Intravenous  Once 07/13/13 2318 07/14/13 0048   07/13/13 2115  vancomycin (VANCOCIN) IVPB 1000 mg/200 mL premix     1,000 mg 200 mL/hr over 60 Minutes Intravenous  Once 07/13/13 2112 07/13/13 2257       Assessment/Plan POD#2 s/p Ex  Lap, SBR, Incarcerated RIH repair with mesh -Continue NGT (811mL/24 hours) until bowel function returns  -Pulmonary toilet  ABL Anemia - continue to monitor ID - Vanc/Maxipime  Mult medical issues - per hospitalist  PCM - on TNA Deconditioning - PT/OT recommending SNF Disp - Await bowel function, SNF when medically/surgically stable    LOS: 2 days    Raymond Larsen 07/15/2013, 7:27 AM Pager: [phone]

## 2013-07-15 NOTE — Progress Notes (Signed)
Dressings removed. Wounds OK, minimal ooze inferiorly. Agree with transfer to floor. Await return of bowel function. Patient examined and I agree with the assessment and plan  Georganna Skeans, MD, MPH, FACS Trauma: [phone] General Surgery: [phone]  07/15/2013 10:02 AM

## 2013-07-16 DIAGNOSIS — K7030 Alcoholic cirrhosis of liver without ascites: Secondary | ICD-10-CM

## 2013-07-16 DIAGNOSIS — J690 Pneumonitis due to inhalation of food and vomit: Secondary | ICD-10-CM

## 2013-07-16 LAB — BASIC METABOLIC PANEL
BUN: 18 mg/dL (ref 6–23)
CALCIUM: 8 mg/dL — AB (ref 8.4–10.5)
CO2: 23 meq/L (ref 19–32)
CREATININE: 0.54 mg/dL (ref 0.50–1.35)
Chloride: 101 mEq/L (ref 96–112)
GFR calc Af Amer: >90 mL/min (ref >90)
GFR calc non Af Amer: >90 mL/min (ref >90)
Glucose, Bld: 109 mg/dL — ABNORMAL HIGH (ref 70–99)
Potassium: 3.9 mEq/L (ref 3.7–5.3)
Sodium: 132 mEq/L — ABNORMAL LOW (ref 137–147)

## 2013-07-16 LAB — MAGNESIUM: MAGNESIUM: 2.1 mg/dL (ref 1.5–2.5)

## 2013-07-16 LAB — GLUCOSE, CAPILLARY
GLUCOSE-CAPILLARY: 106 mg/dL — AB (ref 70–99)
GLUCOSE-CAPILLARY: 107 mg/dL — AB (ref 70–99)
Glucose-Capillary: 120 mg/dL — ABNORMAL HIGH (ref 70–99)
Glucose-Capillary: 100 mg/dL — ABNORMAL HIGH (ref 70–99)
Glucose-Capillary: 120 mg/dL — ABNORMAL HIGH (ref 70–99)
Glucose-Capillary: 109 mg/dL — ABNORMAL HIGH (ref 70–99)
Glucose-Capillary: 99 mg/dL (ref 70–99)

## 2013-07-16 LAB — PHOSPHORUS: PHOSPHORUS: 1.8 mg/dL — AB (ref 2.3–4.6)

## 2013-07-16 LAB — PREALBUMIN: Prealbumin: <3 mg/dL — ABNORMAL LOW (ref 17.0–34.0)

## 2013-07-16 MED ORDER — CLINIMIX E/DEXTROSE (5/15) 5 % IV SOLN
INTRAVENOUS | Status: AC
  Administered 2013-07-16: 17:00:00 via INTRAVENOUS
  Filled 2013-07-16: qty 1000

## 2013-07-16 MED ORDER — POTASSIUM PHOSPHATE DIBASIC 3 MMOLE/ML IV SOLN
21.0000 mmol | Freq: Once | INTRAVENOUS | Status: AC
  Administered 2013-07-16: 21 mmol via INTRAVENOUS
  Filled 2013-07-16: qty 7

## 2013-07-16 MED ORDER — FAT EMULSION 20 % IV EMUL
250.0000 mL | INTRAVENOUS | Status: AC
  Administered 2013-07-16: 250 mL via INTRAVENOUS
  Filled 2013-07-16: qty 250

## 2013-07-16 NOTE — Progress Notes (Signed)
PARENTERAL NUTRITION CONSULT NOTE - FOLLOW UP  Pharmacy Consult for TPN Indication: SBO  Allergies  Allergen Reactions  . Amitriptyline Other (See Comments)    Dizziness   . Beta Adrenergic Blockers Other (See Comments)    lethargy  . Imdur [Isosorbide]     Headache   . Niacin And Related Rash       . Sulfa Antibiotics Itching, Nausea And Vomiting and Rash    Patient Measurements: Height: 5' 8"  (172.7 cm) Weight: 149 lb 0.5 oz (67.6 kg) IBW/kg (Calculated) : 68.4  Vital Signs: Temp: 98.4 F (36.9 C) (03/25 0631) Temp src: Oral (03/25 0631) BP: 130/67 mmHg (03/25 0631) Pulse Rate: 84 (03/25 0631) Intake/Output from previous day: 03/24 0701 - 03/25 0700 In: 2199 [I.V.:1170; NG/GT:90; IV Piggyback:392; TPN:547] Out: 1050 [Urine:850; Emesis/NG output:200] Intake/Output from this shift:    Labs:  Recent Labs  07/13/13 1940 07/13/13 2232 07/14/13 0508 07/15/13 0425  WBC 15.6*  --  8.0 10.0  HGB 13.1  --  11.4* 9.8*  HCT 37.1*  --  32.8* 28.0*  PLT 215  --  166 163  INR  --  1.78*  --   --      Recent Labs  07/13/13 1940 07/14/13 0508 07/15/13 0425  NA 130* 132* 133*  K 4.4 4.1 3.6*  CL 92* 96 100  CO2 23 23 24   GLUCOSE 84 92 120*  BUN 31* 30* 31*  CREATININE 0.80 0.91 0.76  CALCIUM 8.8 8.6 8.0*  MG  --  1.9 2.2  PHOS  --  3.5 2.1*  PROT 5.9* 5.1* 4.6*  ALBUMIN 2.4* 2.2* 1.9*  AST 27 21 18   ALT 11 10 9   ALKPHOS 194* 146* 122*  BILITOT 1.1 1.0 0.7  PREALBUMIN  --   --  <3.0*  TRIG  --   --  80   Estimated Creatinine Clearance: 61 ml/min (by C-G formula based on Cr of 0.76).    Recent Labs  07/15/13 2331 07/16/13 0338 07/16/13 0744  GLUCAP 101* 100* 106*    Insulin Requirements in the past 24 hours:  1 unit Novolog  Current Nutrition:  Clinimix E 5/15 at 40 ml/hr + 20% lipid emulsion at 10 ml/hr - provides 48 g protein and 1162 kCal per day  Assessment: 24 YOM from SNF admitted with several days of N/V. With right sided hernia  for quite awhile, usually soft. Found to have SBO and taken to OR for hernia repair, ex lap and small bowel resection very early this morning   GI: area of necrotic small bowel that would not reduce along with necrotic hernia sac and omentum. SBO with NGT- 200 ml out in past 24h. Unsure of last BM.  Remains NPO, on bowel rest.  Endo: hx hypothyroidism- TSH low in Nov 2014; no hx DM, CBGs well-controlled with SSI   Lytes: Na 132, K 3.9, Phos 1.8, Mag 2.1.  With declining Phos I am concerned about refeeding and will not advance his TPN today.  Renal: SCr 0.54, UOP 0.5 ml/kg/hr but appears to be increasing today. Currently getting NS at 67m/hr   Pulm: 2L [place]   Cards: hx AFib- taken off warfarin in Janary 2014 d/t high fall risk; also with hx HTN, HLD and angina; VSS, no pressors  Hepatobil: noted hx cirrhosis with ascites and esophageal varices; alk phos elevated at 122, albumin low at 1.9. Other LFTs are WNL. Note INR is 1.78 in absence of warfarin.  Triglycerides 80, Prealbumin <3  indicative of poor nutritional status.  Neuro: typically A&O, having some moments of confusion. GCS 20  ID: presumed HCAP- started on vanc/cefepime per Rx. D#3. WBC wnl, LA 3.35. Cultures pending   Best Practices: IV PPI, Lovenox, SCDs   TPN Access: PICC placed 3/23   TPN day#: 3 (started 3/23)  Nutritional Goals:  1900-2100 kCal, 90-100 grams of protein per day  Plan:  Continue Clinimix E 5/15 at 60m/hr + 20% lipid emulsion at 153mhr- this will provide 48 gm protein and 1162 kCal per day Daily IV multivitamin; Trace elements MWF only d/t national shortage  Continue CBGs and sensitive SSI q4h Continue MIVF as NS at 85 mL/hr  Give potassium phosphate 21 mmol IV x 1, this will also provide 31 mEq of K. CMET, Mag, Phos with AM labs   MiLegrand ComoPharm.D., BCPS, AAHIVP Clinical Pharmacist Phone: [phone] 6725/25/2015, 9:50 AM

## 2013-07-16 NOTE — Progress Notes (Signed)
3 Days Post-Op  Subjective: Afebrile with normal WBC. Pt confused. Told me multiple times about pain from his hernia, which was corrected in surgery and thinks he was told he would need to return for more surgery. NG output incorrect as was clogged on high suction upon entrance to the room.  Unclogged after multiple attempts at bedside by Coralie Keens, PA-C.  Denies N/V, flatus, or BM.  Still coughing up "pleghm". No real improvement in pain.  Objective: Vital signs in last 24 hours: Temp:  [97.7 F (36.5 C)-98.4 F (36.9 C)] 98.4 F (36.9 C) (03/25 0631) Pulse Rate:  [76-93] 84 (03/25 0631) Resp:  [11-19] 15 (03/25 0631) BP: (106-148)/(53-71) 130/67 mmHg (03/25 0631) SpO2:  [96 %-100 %] 96 % (03/25 0631) Weight:  [149 lb 0.5 oz (67.6 kg)-149 lb 4 oz (67.7 kg)] 149 lb 0.5 oz (67.6 kg) (03/25 0359) Last BM Date:  (patient is unsure)  Intake/Output from previous day: 03/24 0701 - 03/25 0700 In: 2199 [I.V.:1170; NG/GT:90; IV Piggyback:392; TPN:547] Out: 1050 [Urine:850; Emesis/NG output:200] Intake/Output this shift:    PE: Gen:  Confused, NAD, pleasant Card:  RRR, no M/G/R heard Pulm:  CTA, no W/R/R. Coarse lung sounds bilaterally that clear with cough. Abd: Soft, Moderately tender to incision, minimal diffuse tenderness.  No noticeable distention. Midline and horizontal wound from repair of hernia stapled with no obvious discharge. Some bruising to RUQ.   Lab Results:   Recent Labs  07/14/13 0508 07/15/13 0425  WBC 8.0 10.0  HGB 11.4* 9.8*  HCT 32.8* 28.0*  PLT 166 163   BMET  Recent Labs  07/14/13 0508 07/15/13 0425  NA 132* 133*  K 4.1 3.6*  CL 96 100  CO2 23 24  GLUCOSE 92 120*  BUN 30* 31*  CREATININE 0.91 0.76  CALCIUM 8.6 8.0*   PT/INR  Recent Labs  07/13/13 2232  LABPROT 20.2*  INR 1.78*   CMP     Component Value Date/Time   NA 133* 07/15/2013 0425   K 3.6* 07/15/2013 0425   CL 100 07/15/2013 0425   CO2 24 07/15/2013 0425   GLUCOSE 120*  07/15/2013 0425   BUN 31* 07/15/2013 0425   CREATININE 0.76 07/15/2013 0425   CREATININE 0.85 07/25/2012 1619   CALCIUM 8.0* 07/15/2013 0425   PROT 4.6* 07/15/2013 0425   ALBUMIN 1.9* 07/15/2013 0425   AST 18 07/15/2013 0425   ALT 9 07/15/2013 0425   ALKPHOS 122* 07/15/2013 0425   BILITOT 0.7 07/15/2013 0425   GFRNONAA 79* 07/15/2013 0425   GFRAA >90 07/15/2013 0425   Lipase     Component Value Date/Time   LIPASE 47 07/13/2013 1940       Studies/Results: No results found.  Anti-infectives: Anti-infectives   Start     Dose/Rate Route Frequency Ordered Stop   07/14/13 1000  vancomycin (VANCOCIN) IVPB 750 mg/150 ml premix     750 mg 150 mL/hr over 60 Minutes Intravenous Every 12 hours 07/14/13 0341 07/22/13 0959   07/14/13 0331  ceFEPIme (MAXIPIME) 1 g in dextrose 5 % 50 mL IVPB     1 g 100 mL/hr over 30 Minutes Intravenous 3 times per day 07/14/13 0332 07/22/13 0559   07/13/13 2330  piperacillin-tazobactam (ZOSYN) IVPB 3.375 g  Status:  Discontinued     3.375 g 12.5 mL/hr over 240 Minutes Intravenous  Once 07/13/13 2318 07/13/13 2318   07/13/13 2330  [MAR Hold]  piperacillin-tazobactam (ZOSYN) IVPB 3.375 g     (On MAR  Hold since 07/14/13 0037)   3.375 g 12.5 mL/hr over 240 Minutes Intravenous  Once 07/13/13 2318 07/14/13 0048   07/13/13 2115  vancomycin (VANCOCIN) IVPB 1000 mg/200 mL premix     1,000 mg 200 mL/hr over 60 Minutes Intravenous  Once 07/13/13 2112 07/13/13 2257     Vancomycin 07/13/13 >>> Present (Day 3/8) Cefepime 07/14/13 >>> Present   (Day 2/7)   Assessment/Plan POD#3 s/p Ex Lap, SBR, Incarcerated RIH repair with mesh  -Continue NGT (269mL/24 hours) until bowel function returns. Was clogged due to consistency of stomach contents.  Please monitor for next several hours. -Pulmonary toilet if needed ABL Anemia - continue to monitor  ID - Vanc/Maxipime  Mult medical issues - per hospitalist  PCM - on TNA  Deconditioning - PT/OT recommending SNF  Disp - Still  awaiting bowel function, SNF when medically/surgically stable   LOS: 3 days    Legrand Como A. Willys Salvino, [place] 07/16/2013, 9:28 [facility] Phone #: [phone]

## 2013-07-16 NOTE — Progress Notes (Signed)
Patient examined and I agree with the assessment and plan  Georganna Skeans, MD, MPH, FACS Trauma: [phone] General Surgery: [phone]  07/16/2013 5:49 PM

## 2013-07-16 NOTE — Progress Notes (Signed)
PATIENT DETAILS Name: Raymond Lafosse Sr. Age: 80 y.o. Sex: male Date of Birth: [birth year] Admit Date: 07/13/2013 Admitting Physician Stark Klein, MD YPP:[id number],TIWPYK H, MD  Subjective: No major issues overnight. Mild confusion this morning.  Assessment/Plan: Active Problems: Sepsis - Secondary to presumed aspiration pneumonia and incarcerated inguinal hernia - Resolved with antibiotics, and laparotomy. - Currently on vancomycin (since 3/22) and cefepime (since 3/23) - Blood culture on 07/13/13-negative  Suspected aspiration pneumonitis versus HCAP - Afebrile, and with no leukocytosis - Currently on vancomycin (since 3/22) and cefepime (since 3/23) - Blood culture on 07/13/13-negative - Before resuming diet, may need a SLP evaluation.  SBO - s/p Ex Lap, Incarcerated RIH repair with mesh  -POD#3 - NG tube in place, on TNA- awaiting return of bowel function - General surgery following  History of Atrial fibrillation -Rate controlled. Not anticoagulated due to history of bleeding last year - Currently not on any rate controlling agents, will resume Lopressor when able.  Hypothyroidism - Continue with levothyroxine intravenously, changed to oral route when able.  BPH - Currently with a Foley catheter in place - Resume finasteride when oral intake resumes  History of alcoholic liver cirrhosis - Resume diuretics when able.  History of gastroesophageal reflux disease - Continue PPI  Disposition: Remain inpatient- will need to go to SNF on discharge  DVT Prophylaxis: Prophylactic Lovenox  Code Status:  DNR  Family Communication None at bedside this morning  Procedures: Repair strangulated incarcerated right femoral hernia, exploratory laparotomy with small bowel resection on 3/23  CONSULTS:  general surgery  Time spent 40 minutes-which includes 50% of the time with face-to-face with patient/ family and coordinating care related to the above assessment  and plan.  MEDICATIONS: Scheduled Meds: . ceFEPime (MAXIPIME) IV  1 g Intravenous 3 times per day  . enoxaparin (LOVENOX) injection  40 mg Subcutaneous Q24H  . insulin aspart  0-9 Units Subcutaneous 6 times per day  . levothyroxine  50 mcg Intravenous QAC breakfast  . pantoprazole (PROTONIX) IV  40 mg Intravenous Q24H  . potassium phosphate IVPB (mmol)  21 mmol Intravenous Once  . sodium chloride  10-40 mL Intracatheter Q12H  . vancomycin  750 mg Intravenous Q12H  . vitamin B-12  1,000 mcg Oral Daily   Continuous Infusions: . Marland KitchenTPN (CLINIMIX-E) Adult 40 mL/hr at 07/15/13 1900   And  . fat emulsion 250 mL (07/15/13 1900)  . Marland KitchenTPN (CLINIMIX-E) Adult     And  . fat emulsion     PRN Meds:.acetaminophen, acetaminophen, meclizine, morphine injection, ondansetron (ZOFRAN) IV, ondansetron, sodium chloride  Antibiotics: Anti-infectives   Start     Dose/Rate Route Frequency Ordered Stop   07/14/13 1000  vancomycin (VANCOCIN) IVPB 750 mg/150 ml premix     750 mg 150 mL/hr over 60 Minutes Intravenous Every 12 hours 07/14/13 0341 07/22/13 0959   07/14/13 0331  ceFEPIme (MAXIPIME) 1 g in dextrose 5 % 50 mL IVPB     1 g 100 mL/hr over 30 Minutes Intravenous 3 times per day 07/14/13 0332 07/22/13 0559   07/13/13 2330  piperacillin-tazobactam (ZOSYN) IVPB 3.375 g  Status:  Discontinued     3.375 g 12.5 mL/hr over 240 Minutes Intravenous  Once 07/13/13 2318 07/13/13 2318   07/13/13 2330  [MAR Hold]  piperacillin-tazobactam (ZOSYN) IVPB 3.375 g     (On MAR Hold since 07/14/13 0037)   3.375 g 12.5 mL/hr over 240 Minutes Intravenous  Once 07/13/13 2318 07/14/13  7673   07/13/13 2115  vancomycin (VANCOCIN) IVPB 1000 mg/200 mL premix     1,000 mg 200 mL/hr over 60 Minutes Intravenous  Once 07/13/13 2112 07/13/13 2257       PHYSICAL EXAM: Vital signs in last 24 hours: Filed Vitals:   07/15/13 2300 07/16/13 0359 07/16/13 0631 07/16/13 1412  BP: 135/71  130/67 136/74  Pulse: 89  84 92  Temp:    98.4 F (36.9 C) 98.2 F (36.8 C)  TempSrc:   Oral Oral  Resp:   15 18  Height:      Weight:  67.6 kg (149 lb 0.5 oz)    SpO2:   96% 98%    Weight change: -0.4 kg (-14.1 oz) Filed Weights   07/15/13 0500 07/15/13 2108 07/16/13 0359  Weight: 68.1 kg (150 lb 2.1 oz) 67.7 kg (149 lb 4 oz) 67.6 kg (149 lb 0.5 oz)   Body mass index is 22.67 kg/(m^2).   Gen Exam: Awake and lethargic-somewhat confused Neck: Supple, No JVD.   Chest: Bilateral air entry, coarse by basilar rales CVS: S1 S2 Regular, no murmurs.  Abdomen: soft, BS + but very sluggish, non tender, non distended- dry dressing Extremities: no edema, lower extremities warm to touch. Neurologic: Non Focal.   Skin: No Rash.   Wounds: N/A.    Intake/Output from previous day:  Intake/Output Summary (Last 24 hours) at 07/16/13 1428 Last data filed at 07/16/13 0654  Gross per 24 hour  Intake    737 ml  Output    780 ml  Net    -43 ml     LAB RESULTS: CBC  Recent Labs Lab 07/13/13 1940 07/14/13 0508 07/15/13 0425  WBC 15.6* 8.0 10.0  HGB 13.1 11.4* 9.8*  HCT 37.1* 32.8* 28.0*  PLT 215 166 163  MCV 90.0 91.1 91.2  MCH 31.8 31.7 31.9  MCHC 35.3 34.8 35.0  RDW 15.5 15.9* 16.0*  LYMPHSABS 0.9  --  1.6  MONOABS 0.8  --  0.8  EOSABS 0.0  --  0.6  BASOSABS 0.0  --  0.0    Chemistries   Recent Labs Lab 07/13/13 1940 07/14/13 0508 07/15/13 0425 07/16/13 1030  NA 130* 132* 133* 132*  K 4.4 4.1 3.6* 3.9  CL 92* 96 100 101  CO2 23 23 24 23   GLUCOSE 84 92 120* 109*  BUN 31* 30* 31* 18  CREATININE 0.80 0.91 0.76 0.54  CALCIUM 8.8 8.6 8.0* 8.0*  MG  --  1.9 2.2 2.1    CBG:  Recent Labs Lab 07/15/13 1918 07/15/13 2331 07/16/13 0338 07/16/13 0744 07/16/13 1151  GLUCAP 98 101* 100* 106* 120*    GFR Estimated Creatinine Clearance: 61 ml/min (by C-G formula based on Cr of 0.54).  Coagulation profile  Recent Labs Lab 07/13/13 2232  INR 1.78*    Cardiac Enzymes No results found for this  basename: CK, CKMB, TROPONINI, MYOGLOBIN,  in the last 168 hours  No components found with this basename: POCBNP,  No results found for this basename: DDIMER,  in the last 72 hours No results found for this basename: HGBA1C,  in the last 72 hours  Recent Labs  07/15/13 0425  TRIG 80   No results found for this basename: TSH, T4TOTAL, FREET3, T3FREE, THYROIDAB,  in the last 72 hours No results found for this basename: VITAMINB12, FOLATE, FERRITIN, TIBC, IRON, RETICCTPCT,  in the last 72 hours  Recent Labs  07/13/13 1940  LIPASE 47  Urine Studies No results found for this basename: UACOL, UAPR, USPG, UPH, UTP, UGL, UKET, UBIL, UHGB, UNIT, UROB, ULEU, UEPI, UWBC, URBC, UBAC, CAST, CRYS, UCOM, BILUA,  in the last 72 hours  MICROBIOLOGY: Recent Results (from the past 240 hour(s))  CULTURE, BLOOD (ROUTINE X 2)     Status: None   Collection Time    07/13/13  8:40 PM      Result Value Ref Range Status   Specimen Description BLOOD RIGHT ARM   Final   Special Requests BOTTLES DRAWN AEROBIC AND ANAEROBIC 10CC EACH   Final   Culture  Setup Time     Final   Value: 07/14/2013 01:55     Performed at [facility]   Culture     Final   Value:        BLOOD CULTURE RECEIVED NO GROWTH TO DATE CULTURE WILL BE HELD FOR 5 DAYS BEFORE ISSUING A FINAL NEGATIVE REPORT     Performed at [facility]   Report Status PENDING   Incomplete  CULTURE, BLOOD (ROUTINE X 2)     Status: None   Collection Time    07/13/13  8:45 PM      Result Value Ref Range Status   Specimen Description BLOOD RIGHT HAND   Final   Special Requests BOTTLES DRAWN AEROBIC ONLY 10CC   Final   Culture  Setup Time     Final   Value: 07/14/2013 01:55     Performed at [facility]   Culture     Final   Value:        BLOOD CULTURE RECEIVED NO GROWTH TO DATE CULTURE WILL BE HELD FOR 5 DAYS BEFORE ISSUING A FINAL NEGATIVE REPORT     Performed at [facility]   Report Status PENDING   Incomplete    URINE CULTURE     Status: None   Collection Time    07/14/13  3:35 AM      Result Value Ref Range Status   Specimen Description URINE, CATHETERIZED   Final   Special Requests NONE   Final   Culture  Setup Time     Final   Value: 07/14/2013 10:14     Performed at [place]     Final   Value: NO GROWTH     Performed at [facility]   Culture     Final   Value: NO GROWTH     Performed at [facility]   Report Status 07/15/2013 FINAL   Final  MRSA PCR SCREENING     Status: None   Collection Time    07/14/13  3:37 AM      Result Value Ref Range Status   MRSA by PCR NEGATIVE  NEGATIVE Final   Comment:            The GeneXpert MRSA Assay (FDA     approved for NASAL specimens     only), is one component of a     comprehensive MRSA colonization     surveillance program. It is not     intended to diagnose MRSA     infection nor to guide or     monitor treatment for     MRSA infections.    RADIOLOGY STUDIES/RESULTS: Dg Chest 2 View  07/13/2013   CLINICAL DATA:  Fever, abnormal EKG  EXAM: CHEST  2 VIEW  COMPARISON:  DG CHEST 1 VIEW dated 05/28/2013  FINDINGS: There is right lower lobe airspace disease. There is no other focal parenchymal opacity. No pleural effusion or pneumothorax. The heart and mediastinal contours are unremarkable.  Right shoulder arthroplasty. Osteoarthritis of the left glenohumeral joint.  IMPRESSION: Right lower lobe pneumonia. Recommend followup radiography in 4-6 weeks, to document complete resolution following adequate medical therapy. If there is not complete resolution, then recommend further evaluation with CT of the chest to exclude underlying pathology.   Electronically Signed   By: Kathreen Devoid   On: 07/13/2013 20:24   Ct Cta Abd/pel W/cm &/or W/o Cm  07/13/2013   CLINICAL DATA:  Nausea and vomiting.  Poor p.o. intake.  EXAM: CT ANGIOGRAPHY ABDOMEN AND PELVIS WITH CONTRAST AND WITHOUT CONTRAST  TECHNIQUE:  Multidetector CT imaging of the abdomen and pelvis was performed using the standard protocol during bolus administration of intravenous contrast. Multiplanar reconstructed images and MIPs were obtained and reviewed to evaluate the vascular anatomy.  CONTRAST:  165mL OMNIPAQUE IOHEXOL 350 MG/ML SOLN  COMPARISON:  CT of the abdomen and pelvis 03/09/2012.  FINDINGS: Lung Bases: Multifocal peribronchovascular interstitial and airspace disease throughout the lower lobes of the lungs bilaterally, and the dependent portion of the right middle lobe, favored to reflect sequela of aspiration with aspiration pneumonitis/pneumonia. Mild cardiomegaly. Calcifications of the mitral annulus and aortic valve. Circumferential thickening of the distal esophagus.  Abdomen/Pelvis: The liver has a markedly shrunken appearance and nodular contour, compatible with advanced cirrhosis. No definite focal hepatic lesions are identified on today's examination. Status post cholecystectomy. Pancreas appears atrophic, but there are no definite focal pancreatic lesions. Small calcifications within the spleen likely represent calcified granulomas. The appearance of the adrenal glands is unremarkable bilaterally. Numerous tiny subcentimeter low attenuation lesions in the kidneys bilaterally are too small to definitively characterize. In addition in the right kidney, there are 2 simple cysts, largest of which is in the medial aspect of the lower pole measuring 2.2 cm in diameter. Prominent portosystemic collateral vessels are noted along the left side of the retroperitoneum, apparently communicating between the splenic circulation and the left iliac circulation. Numerous small gastric and esophageal varices are also noted.  There use a right inguinal hernia which appears to contain a short loop of small bowel. Small bowel proximal to this and the stomach all appear pathologically dilated, compatible with acute bowel obstruction, with bowel loops  measuring up to 4.3 cm in diameter. The distal small bowel is decompressed, as is the colon. Numerous colonic diverticulae are noted. Trace volume of ascites. No frank pneumoperitoneum to suggest bowel perforation at this time. Notably, although the loops of bowel immediately adjacent to the hernia demonstrate avid mucosal enhancement, the bowel loop within the hernia demonstrates markedly decreased enhancement, suggesting incarceration and ischemia. There is a small volume of fluid within the right inguinal hernia as well, which is low attenuation.  Musculoskeletal: Status post ORIF in the left femoral neck. There are no aggressive appearing lytic or blastic lesions noted in the visualized portions of the skeleton. T9 vertebral body compression fracture with 60% loss of anterior vertebral body height and 40% loss of posterior vertebral body height appears to be chronic.  Review of the MIP images confirms the above findings.  IMPRESSION: 1. Incarcerated right inguinal hernia containing a short loop of small bowel with evidence of decreased perfusion of the small bowel loop within the hernia, and proximal small bowel obstruction. Surgical consultation is strongly recommended. 2. No pneumoperitoneum at this time. 3. Small volume of ascites  may be reactive, or could relate to underlying chronic liver disease. 4. Advanced cirrhosis, as above. 5. Colonic diverticulosis without findings to suggest acute diverticulitis at this time. 6. Multifocal peribronchovascular interstitial and airspace disease throughout the lung bases bilaterally (right greater than left), likely reflects sequela of recent aspiration. 7. Additional incidental findings, as above. These results were called by telephone at the time of interpretation on 07/13/2013 at 10:07 PM to Dr. Wendall Papa, who verbally acknowledged these results.   Electronically Signed   By: Vinnie Langton M.D.   On: 07/13/2013 22:10    Oren Binet, MD  [facility] Pager:[phone]  If 7PM-7AM, please contact night-coverage [email or web address] Password [email or web address] 07/16/2013, 2:28 PM   LOS: 3 days

## 2013-07-17 DIAGNOSIS — E46 Unspecified protein-calorie malnutrition: Secondary | ICD-10-CM

## 2013-07-17 LAB — COMPREHENSIVE METABOLIC PANEL
ALT: 10 U/L (ref 0–53)
AST: 29 U/L (ref 0–37)
Albumin: 1.6 g/dL — ABNORMAL LOW (ref 3.5–5.2)
Alkaline Phosphatase: 160 U/L — ABNORMAL HIGH (ref 39–117)
BILIRUBIN TOTAL: 0.6 mg/dL (ref 0.3–1.2)
BUN: 16 mg/dL (ref 6–23)
CHLORIDE: 101 meq/L (ref 96–112)
CO2: 23 meq/L (ref 19–32)
CREATININE: 0.49 mg/dL — AB (ref 0.50–1.35)
Calcium: 8.1 mg/dL — ABNORMAL LOW (ref 8.4–10.5)
GLUCOSE: 95 mg/dL (ref 70–99)
Potassium: 4.1 mEq/L (ref 3.7–5.3)
Sodium: 131 mEq/L — ABNORMAL LOW (ref 137–147)
Total Protein: 4.6 g/dL — ABNORMAL LOW (ref 6.0–8.3)

## 2013-07-17 LAB — GLUCOSE, CAPILLARY
GLUCOSE-CAPILLARY: 91 mg/dL (ref 70–99)
GLUCOSE-CAPILLARY: 99 mg/dL (ref 70–99)
Glucose-Capillary: 96 mg/dL (ref 70–99)
Glucose-Capillary: 115 mg/dL — ABNORMAL HIGH (ref 70–99)
Glucose-Capillary: 109 mg/dL — ABNORMAL HIGH (ref 70–99)

## 2013-07-17 LAB — MAGNESIUM: Magnesium: 2.1 mg/dL (ref 1.5–2.5)

## 2013-07-17 LAB — PHOSPHORUS: PHOSPHORUS: 1.9 mg/dL — AB (ref 2.3–4.6)

## 2013-07-17 LAB — VANCOMYCIN, TROUGH: Vancomycin Tr: 15.2 ug/mL (ref 10.0–20.0)

## 2013-07-17 MED ORDER — INSULIN ASPART 100 UNIT/ML ~~LOC~~ SOLN: 0.0000 [IU] | Freq: Four times a day (QID) | SUBCUTANEOUS | Status: DC

## 2013-07-17 MED ORDER — CHLORHEXIDINE GLUCONATE 0.12 % MT SOLN
15.0000 mL | Freq: Two times a day (BID) | OROMUCOSAL | Status: DC
  Administered 2013-07-17 – 2013-07-21 (×9): 15 mL via OROMUCOSAL
  Filled 2013-07-17 (×12): qty 15

## 2013-07-17 MED ORDER — BIOTENE DRY MOUTH MT LIQD
15.0000 mL | Freq: Two times a day (BID) | OROMUCOSAL | Status: DC
  Administered 2013-07-17 – 2013-07-21 (×7): 15 mL via OROMUCOSAL

## 2013-07-17 MED ORDER — SODIUM PHOSPHATE 3 MMOLE/ML IV SOLN
21.0000 mmol | Freq: Once | INTRAVENOUS | Status: AC
  Administered 2013-07-17: 21 mmol via INTRAVENOUS
  Filled 2013-07-17 (×2): qty 7

## 2013-07-17 MED ORDER — FAT EMULSION 20 % IV EMUL
250.0000 mL | INTRAVENOUS | Status: AC
  Administered 2013-07-17: 250 mL via INTRAVENOUS
  Filled 2013-07-17: qty 250

## 2013-07-17 MED ORDER — M.V.I. ADULT IV INJ
INTRAVENOUS | Status: AC
  Administered 2013-07-17: 17:00:00 via INTRAVENOUS
  Filled 2013-07-17: qty 1000

## 2013-07-17 NOTE — Progress Notes (Signed)
ANTIBIOTIC CONSULT NOTE - FOLLOW UP  Pharmacy Consult for vancomycin, cefepime Indication: PNA  Allergies  Allergen Reactions  . Amitriptyline Other (See Comments)    Dizziness   . Beta Adrenergic Blockers Other (See Comments)    lethargy  . Imdur [Isosorbide]     Headache   . Niacin And Related Rash       . Sulfa Antibiotics Itching, Nausea And Vomiting and Rash    Patient Measurements: Height: 5\' 8"  (172.7 cm) Weight: 151 lb 7.3 oz (68.7 kg) IBW/kg (Calculated) : 68.4  Vital Signs: Temp: 98.9 F (37.2 C) (03/26 0552) Temp src: Oral (03/26 0552) BP: 120/72 mmHg (03/26 0552) Pulse Rate: 92 (03/26 0552) Intake/Output from previous day: 03/25 0701 - 03/26 0700 In: -  Out: 300 [Urine:300] Intake/Output from this shift:    Labs:  Recent Labs  07/15/13 0425 07/16/13 1030 07/17/13 0505  WBC 10.0  --   --   HGB 9.8*  --   --   PLT 163  --   --   CREATININE 0.76 0.54 0.49*   Estimated Creatinine Clearance: 61.8 ml/min (by C-G formula based on Cr of 0.49).  Recent Labs  07/17/13 1020  VANCOTROUGH 15.2     Microbiology: Recent Results (from the past 720 hour(s))  CULTURE, BLOOD (ROUTINE X 2)     Status: None   Collection Time    07/13/13  8:40 PM      Result Value Ref Range Status   Specimen Description BLOOD RIGHT ARM   Final   Special Requests BOTTLES DRAWN AEROBIC AND ANAEROBIC 10CC EACH   Final   Culture  Setup Time     Final   Value: 07/14/2013 01:55     Performed at [facility]   Culture     Final   Value:        BLOOD CULTURE RECEIVED NO GROWTH TO DATE CULTURE WILL BE HELD FOR 5 DAYS BEFORE ISSUING A FINAL NEGATIVE REPORT     Performed at [facility]   Report Status PENDING   Incomplete  CULTURE, BLOOD (ROUTINE X 2)     Status: None   Collection Time    07/13/13  8:45 PM      Result Value Ref Range Status   Specimen Description BLOOD RIGHT HAND   Final   Special Requests BOTTLES DRAWN AEROBIC ONLY 10CC   Final   Culture   Setup Time     Final   Value: 07/14/2013 01:55     Performed at [facility]   Culture     Final   Value:        BLOOD CULTURE RECEIVED NO GROWTH TO DATE CULTURE WILL BE HELD FOR 5 DAYS BEFORE ISSUING A FINAL NEGATIVE REPORT     Performed at [facility]   Report Status PENDING   Incomplete  URINE CULTURE     Status: None   Collection Time    07/14/13  3:35 AM      Result Value Ref Range Status   Specimen Description URINE, CATHETERIZED   Final   Special Requests NONE   Final   Culture  Setup Time     Final   Value: 07/14/2013 10:14     Performed at [facility]     Final   Value: NO GROWTH     Performed at [facility]   Culture     Final   Value: NO  GROWTH     Performed at [facility]   Report Status 07/15/2013 FINAL   Final  MRSA PCR SCREENING     Status: None   Collection Time    07/14/13  3:37 AM      Result Value Ref Range Status   MRSA by PCR NEGATIVE  NEGATIVE Final   Comment:            The GeneXpert MRSA Assay (FDA     approved for NASAL specimens     only), is one component of a     comprehensive MRSA colonization     surveillance program. It is not     intended to diagnose MRSA     infection nor to guide or     monitor treatment for     MRSA infections.    Anti-infectives   Start     Dose/Rate Route Frequency Ordered Stop   07/14/13 1000  vancomycin (VANCOCIN) IVPB 750 mg/150 ml premix     750 mg 150 mL/hr over 60 Minutes Intravenous Every 12 hours 07/14/13 0341 07/22/13 0959   07/14/13 0331  ceFEPIme (MAXIPIME) 1 g in dextrose 5 % 50 mL IVPB     1 g 100 mL/hr over 30 Minutes Intravenous 3 times per day 07/14/13 0332 07/22/13 0559   07/13/13 2330  piperacillin-tazobactam (ZOSYN) IVPB 3.375 g  Status:  Discontinued     3.375 g 12.5 mL/hr over 240 Minutes Intravenous  Once 07/13/13 2318 07/13/13 2318   07/13/13 2330  [MAR Hold]  piperacillin-tazobactam (ZOSYN) IVPB 3.375 g     (On MAR Hold since  07/14/13 0037)   3.375 g 12.5 mL/hr over 240 Minutes Intravenous  Once 07/13/13 2318 07/14/13 0048   07/13/13 2115  vancomycin (VANCOCIN) IVPB 1000 mg/200 mL premix     1,000 mg 200 mL/hr over 60 Minutes Intravenous  Once 07/13/13 2112 07/13/13 2257      Assessment: 8 o male with suspected aspiration versus HCAP on vancomycin and cefepime. Patient also noted  POD#4 s/p hernia repair on TPN.  Last WBC= 10 on 3/24, SCr= 0.49 and a vancomycin trough today is 15.2 and within goal.  Vanc 3/23>> Cefepime 3/23>>  3/23 MRSA - NEG 3/23 Urine - NEG 3/22 Blood - Neg   Goal of Therapy:  Vancomycin trough level 15-20 mcg/ml  Plan:  -No vancomycin dose changes needed -Continue cefepime  -Consider d/c vancomycin?  Hildred Laser, Pharm D 07/17/2013 11:48 AM

## 2013-07-17 NOTE — Progress Notes (Signed)
RN noticed that patient's penis and testicles were very swollen. RN asked patient if this happens often- patient stated that this happens once in a while and that his b/p meds usually help him. RN has elevated area and relieved some strain on his foley.

## 2013-07-17 NOTE — Progress Notes (Signed)
PARENTERAL NUTRITION CONSULT NOTE - FOLLOW UP  Pharmacy Consult for TPN Indication: SBO  Allergies  Allergen Reactions  . Amitriptyline Other (See Comments)    Dizziness   . Beta Adrenergic Blockers Other (See Comments)    lethargy  . Imdur [Isosorbide]     Headache   . Niacin And Related Rash       . Sulfa Antibiotics Itching, Nausea And Vomiting and Rash    Patient Measurements: Height: 5' 8" (172.7 cm) Weight: 151 lb 7.3 oz (68.7 kg) IBW/kg (Calculated) : 68.4  Vital Signs: Temp: 98.9 F (37.2 C) (03/26 0552) Temp src: Oral (03/26 0552) BP: 120/72 mmHg (03/26 0552) Pulse Rate: 92 (03/26 0552) Intake/Output from previous day: 03/25 0701 - 03/26 0700 In: -  Out: 300 [Urine:300] Intake/Output from this shift:    Labs:  Recent Labs  07/15/13 0425  WBC 10.0  HGB 9.8*  HCT 28.0*  PLT 163     Recent Labs  07/15/13 0425 07/16/13 1030 07/17/13 0505  NA 133* 132* 131*  K 3.6* 3.9 4.1  CL 100 101 101  CO2 _0 GLUCOSE 120* 109* 95  BUN 31* 18 16  CREATININE 0.76 0.54 0.49*  CALCIUM 8.0* 8.0* 8.1*  MG 2.2 2.1 2.1  PHOS 2.1* 1.8* 1.9*  PROT 4.6*  --  4.6*  ALBUMIN 1.9*  --  1.6*  AST 18  --  29  ALT 9  --  10  ALKPHOS 122*  --  160*  BILITOT 0.7  --  0.6  PREALBUMIN <3.0*  --   --   TRIG 80  --   --    Estimated Creatinine Clearance: 61.8 ml/min (by C-G formula based on Cr of 0.49).    Recent Labs  07/16/13 2343 07/17/13 0413 07/17/13 0742  GLUCAP 107* 91 96    Insulin Requirements in the past 24 hours:  No SSI required  Current Nutrition:  Clinimix E 5/15 at 40 ml/hr + 20% lipid emulsion at 10 ml/hr - provides 48 g protein and 1162 kCal per day  Assessment: Raymond Larsen from SNF admitted with several days of N/V. With right sided hernia for quite awhile, usually soft. Found to have SBO and taken to OR for hernia repair, ex lap and small bowel resection very early this morning   GI: area of necrotic small bowel that would not reduce  along with necrotic hernia sac and omentum. SBO with NGT - per [facility] note documentation of output may be inaccurate due to clogged tube. Unsure of last BM.  Remains NPO, on bowel rest.  Endo: hx hypothyroidism- TSH low in Nov 2014; no hx DM, CBGs well-controlled and requiring minimal to no SSI    Lytes: Na 131, K 4.1, Phos 1.9, Mag 2.1.  Given we have been unable to correct his Phos despite IV bolus doses 3/24 and 3/25 I am concerned about refeeding and will not advance his TPN today.  Renal: SCr 0.49, IVF were decreased to [facility] per his medical team.  Pulm: 2L [place]   Cards: hx AFib- taken off warfarin in Janary 2014 d/t high fall risk; also with hx HTN, HLD and angina; VSS  Hepatobil: noted hx cirrhosis with ascites and esophageal varices; alk phos elevated at 160, albumin low at 1.6. Other LFTs are WNL. Note INR is 1.78 in absence of warfarin.  Triglycerides 80, Prealbumin <3 indicative of poor nutritional status.  Neuro: typically A&O, having some moments of confusion. GCS 20  ID:  presumed HCAP- started on vanc/cefepime per Rx. D#4. WBC wnl, LA 3.35. Cultures pending   Best Practices: IV PPI, Lovenox, SCDs   TPN Access: PICC placed 3/23   TPN day#: 4 (started 3/23)  Nutritional Goals:  1900-2100 kCal, 90-100 grams of protein per day  Plan:  Continue Clinimix E 5/15 at 7m/hr + 20% lipid emulsion at 134mhr- this will provide 48 gm protein and 1162 kCal per day Daily IV multivitamin; Trace elements MWF only d/t national shortage  Continue CBGs and sensitive SSI but decrease frequency of checks to q6h Give sodium phosphate 20 mMol IV x 1 today. CMET, Mag, Phos with AM labs   MiLegrand ComoPharm.D., BCPS, AAHIVP Clinical Pharmacist Phone: [phone]/26/2015, 8:52 AM

## 2013-07-17 NOTE — Progress Notes (Signed)
PATIENT DETAILS Name: Raymond Meister Sr. Age: 80 y.o. Sex: male Date of Birth: [birth year] Admit Date: 07/13/2013 Admitting Physician Stark Klein, MD DJS:HFWYOVZ,CHYIFO H, MD  Subjective: No major issues overnight. Still with mild confusion this morning.  Assessment/Plan: Active Problems: Sepsis - Secondary to presumed aspiration pneumonia and incarcerated inguinal hernia - Resolved with antibiotics, and laparotomy. - Currently on vancomycin (since 3/22) and cefepime (since 3/23)-continue- would plan at least one week of antibiotic therapy - Blood culture on 07/13/13-negative  Suspected aspiration pneumonitis versus HCAP - Afebrile, and with no leukocytosis - Currently on vancomycin (since 3/22) and cefepime (since 3/23)- would plan at least one week of antibiotic therapy - Blood culture on 07/13/13-negative - Before resuming diet, may need a SLP evaluation.  SBO - s/p Ex Lap, Incarcerated RIH repair with mesh  -POD#4 - NG tube in place, on TNA- awaiting return of bowel function - General surgery following  History of Atrial fibrillation -Rate controlled. Not anticoagulated due to history of bleeding last year - Currently not on any rate controlling agents, will resume Lopressor when able.  Hypothyroidism - Continue with levothyroxine intravenously, changed to oral route when able.  BPH - Currently with a Foley catheter in place - Resume finasteride when oral intake resumes  History of alcoholic liver cirrhosis - Resume diuretics when able.  History of gastroesophageal reflux disease - Continue PPI  Disposition: Remain inpatient- will need to go to SNF on discharge  DVT Prophylaxis: Prophylactic Lovenox  Code Status:  DNR  Family Communication None at bedside this morning  Procedures: Repair strangulated incarcerated right femoral hernia, exploratory laparotomy with small bowel resection on 3/23  CONSULTS:  general surgery  MEDICATIONS: Scheduled  Meds: . antiseptic oral rinse  15 mL Mouth Rinse q12n4p  . ceFEPime (MAXIPIME) IV  1 g Intravenous 3 times per day  . chlorhexidine  15 mL Mouth Rinse BID  . enoxaparin (LOVENOX) injection  40 mg Subcutaneous Q24H  . insulin aspart  0-9 Units Subcutaneous 4 times per day  . levothyroxine  50 mcg Intravenous QAC breakfast  . pantoprazole (PROTONIX) IV  40 mg Intravenous Q24H  . sodium chloride  10-40 mL Intracatheter Q12H  . sodium phosphate  Dextrose 5% IVPB  21 mmol Intravenous Once  . vancomycin  750 mg Intravenous Q12H   Continuous Infusions: . Marland KitchenTPN (CLINIMIX-E) Adult 40 mL/hr at 07/16/13 1719   And  . fat emulsion 250 mL (07/16/13 1719)  . Marland KitchenTPN (CLINIMIX-E) Adult     And  . fat emulsion     PRN Meds:.acetaminophen, acetaminophen, meclizine, morphine injection, ondansetron (ZOFRAN) IV, ondansetron, sodium chloride  Antibiotics: Anti-infectives   Start     Dose/Rate Route Frequency Ordered Stop   07/14/13 1000  vancomycin (VANCOCIN) IVPB 750 mg/150 ml premix     750 mg 150 mL/hr over 60 Minutes Intravenous Every 12 hours 07/14/13 0341 07/22/13 0959   07/14/13 0331  ceFEPIme (MAXIPIME) 1 g in dextrose 5 % 50 mL IVPB     1 g 100 mL/hr over 30 Minutes Intravenous 3 times per day 07/14/13 0332 07/22/13 0559   07/13/13 2330  piperacillin-tazobactam (ZOSYN) IVPB 3.375 g  Status:  Discontinued     3.375 g 12.5 mL/hr over 240 Minutes Intravenous  Once 07/13/13 2318 07/13/13 2318   07/13/13 2330  [MAR Hold]  piperacillin-tazobactam (ZOSYN) IVPB 3.375 g     (On MAR Hold since 07/14/13 0037)   3.375 g 12.5 mL/hr over  240 Minutes Intravenous  Once 07/13/13 2318 07/14/13 0048   07/13/13 2115  vancomycin (VANCOCIN) IVPB 1000 mg/200 mL premix     1,000 mg 200 mL/hr over 60 Minutes Intravenous  Once 07/13/13 2112 07/13/13 2257       PHYSICAL EXAM: Vital signs in last 24 hours: Filed Vitals:   07/16/13 1948 07/17/13 0438 07/17/13 0552 07/17/13 0925  BP: 116/69  120/72   Pulse: 97   92   Temp: 98.4 F (36.9 C)  98.9 F (37.2 C)   TempSrc: Oral  Oral   Resp: 18  18   Height:      Weight:  68.7 kg (151 lb 7.3 oz)    SpO2: 94%  94% 92%    Weight change: 1 kg (2 lb 3.3 oz) Filed Weights   07/15/13 2108 07/16/13 0359 07/17/13 0438  Weight: 67.7 kg (149 lb 4 oz) 67.6 kg (149 lb 0.5 oz) 68.7 kg (151 lb 7.3 oz)   Body mass index is 23.03 kg/(m^2).   Gen Exam: Awake and pleasantly confused Neck: Supple, No JVD.   Chest: Bilateral air entry, coarse by basilar rales CVS: S1 S2 Regular, no murmurs.  Abdomen: soft, BS + but very sluggish, non tender, non distended- dry dressing Extremities: no edema, lower extremities warm to touch. Neurologic: Non Focal.   Skin: No Rash.   Wounds: N/A.    Intake/Output from previous day:  Intake/Output Summary (Last 24 hours) at 07/17/13 1220 Last data filed at 07/17/13 1214  Gross per 24 hour  Intake      0 ml  Output    700 ml  Net   -700 ml     LAB RESULTS: CBC  Recent Labs Lab 07/13/13 1940 07/14/13 0508 07/15/13 0425  WBC 15.6* 8.0 10.0  HGB 13.1 11.4* 9.8*  HCT 37.1* 32.8* 28.0*  PLT 215 166 163  MCV 90.0 91.1 91.2  MCH 31.8 31.7 31.9  MCHC 35.3 34.8 35.0  RDW 15.5 15.9* 16.0*  LYMPHSABS 0.9  --  1.6  MONOABS 0.8  --  0.8  EOSABS 0.0  --  0.6  BASOSABS 0.0  --  0.0    Chemistries   Recent Labs Lab 07/13/13 1940 07/14/13 0508 07/15/13 0425 07/16/13 1030 07/17/13 0505  NA 130* 132* 133* 132* 131*  K 4.4 4.1 3.6* 3.9 4.1  CL 92* 96 100 101 101  CO2 23 23 24 23 23   GLUCOSE 84 92 120* 109* 95  BUN 31* 30* 31* 18 16  CREATININE 0.80 0.91 0.76 0.54 0.49*  CALCIUM 8.8 8.6 8.0* 8.0* 8.1*  MG  --  1.9 2.2 2.1 2.1    CBG:  Recent Labs Lab 07/16/13 1648 07/16/13 1941 07/16/13 2343 07/17/13 0413 07/17/13 0742  GLUCAP 109* 99 107* 91 96    GFR Estimated Creatinine Clearance: 61.8 ml/min (by C-G formula based on Cr of 0.49).  Coagulation profile  Recent Labs Lab 07/13/13 2232  INR  1.78*    Cardiac Enzymes No results found for this basename: CK, CKMB, TROPONINI, MYOGLOBIN,  in the last 168 hours  No components found with this basename: POCBNP,  No results found for this basename: DDIMER,  in the last 72 hours No results found for this basename: HGBA1C,  in the last 72 hours  Recent Labs  07/15/13 0425  TRIG 80   No results found for this basename: TSH, T4TOTAL, FREET3, T3FREE, THYROIDAB,  in the last 72 hours No results found for this basename: VITAMINB12,  FOLATE, FERRITIN, TIBC, IRON, RETICCTPCT,  in the last 72 hours No results found for this basename: LIPASE, AMYLASE,  in the last 72 hours  Urine Studies No results found for this basename: UACOL, UAPR, USPG, UPH, UTP, UGL, UKET, UBIL, UHGB, UNIT, UROB, ULEU, UEPI, UWBC, URBC, UBAC, CAST, CRYS, UCOM, BILUA,  in the last 72 hours  MICROBIOLOGY: Recent Results (from the past 240 hour(s))  CULTURE, BLOOD (ROUTINE X 2)     Status: None   Collection Time    07/13/13  8:40 PM      Result Value Ref Range Status   Specimen Description BLOOD RIGHT ARM   Final   Special Requests BOTTLES DRAWN AEROBIC AND ANAEROBIC 10CC EACH   Final   Culture  Setup Time     Final   Value: 07/14/2013 01:55     Performed at [facility]   Culture     Final   Value:        BLOOD CULTURE RECEIVED NO GROWTH TO DATE CULTURE WILL BE HELD FOR 5 DAYS BEFORE ISSUING A FINAL NEGATIVE REPORT     Performed at [facility]   Report Status PENDING   Incomplete  CULTURE, BLOOD (ROUTINE X 2)     Status: None   Collection Time    07/13/13  8:45 PM      Result Value Ref Range Status   Specimen Description BLOOD RIGHT HAND   Final   Special Requests BOTTLES DRAWN AEROBIC ONLY 10CC   Final   Culture  Setup Time     Final   Value: 07/14/2013 01:55     Performed at [facility]   Culture     Final   Value:        BLOOD CULTURE RECEIVED NO GROWTH TO DATE CULTURE WILL BE HELD FOR 5 DAYS BEFORE ISSUING A FINAL NEGATIVE  REPORT     Performed at [facility]   Report Status PENDING   Incomplete  URINE CULTURE     Status: None   Collection Time    07/14/13  3:35 AM      Result Value Ref Range Status   Specimen Description URINE, CATHETERIZED   Final   Special Requests NONE   Final   Culture  Setup Time     Final   Value: 07/14/2013 10:14     Performed at [facility]     Final   Value: NO GROWTH     Performed at [facility]   Culture     Final   Value: NO GROWTH     Performed at [facility]   Report Status 07/15/2013 FINAL   Final  MRSA PCR SCREENING     Status: None   Collection Time    07/14/13  3:37 AM      Result Value Ref Range Status   MRSA by PCR NEGATIVE  NEGATIVE Final   Comment:            The GeneXpert MRSA Assay (FDA     approved for NASAL specimens     only), is one component of a     comprehensive MRSA colonization     surveillance program. It is not     intended to diagnose MRSA     infection nor to guide or     monitor treatment for     MRSA infections.    RADIOLOGY STUDIES/RESULTS: Dg Chest 2 View  07/13/2013   CLINICAL DATA:  Fever, abnormal EKG  EXAM: CHEST  2 VIEW  COMPARISON:  DG CHEST 1 VIEW dated 05/28/2013  FINDINGS: There is right lower lobe airspace disease. There is no other focal parenchymal opacity. No pleural effusion or pneumothorax. The heart and mediastinal contours are unremarkable.  Right shoulder arthroplasty. Osteoarthritis of the left glenohumeral joint.  IMPRESSION: Right lower lobe pneumonia. Recommend followup radiography in 4-6 weeks, to document complete resolution following adequate medical therapy. If there is not complete resolution, then recommend further evaluation with CT of the chest to exclude underlying pathology.   Electronically Signed   By: Kathreen Devoid   On: 07/13/2013 20:24   Ct Cta Abd/pel W/cm &/or W/o Cm  07/13/2013   CLINICAL DATA:  Nausea and vomiting.  Poor p.o. intake.  EXAM: CT  ANGIOGRAPHY ABDOMEN AND PELVIS WITH CONTRAST AND WITHOUT CONTRAST  TECHNIQUE: Multidetector CT imaging of the abdomen and pelvis was performed using the standard protocol during bolus administration of intravenous contrast. Multiplanar reconstructed images and MIPs were obtained and reviewed to evaluate the vascular anatomy.  CONTRAST:  157mL OMNIPAQUE IOHEXOL 350 MG/ML SOLN  COMPARISON:  CT of the abdomen and pelvis 03/09/2012.  FINDINGS: Lung Bases: Multifocal peribronchovascular interstitial and airspace disease throughout the lower lobes of the lungs bilaterally, and the dependent portion of the right middle lobe, favored to reflect sequela of aspiration with aspiration pneumonitis/pneumonia. Mild cardiomegaly. Calcifications of the mitral annulus and aortic valve. Circumferential thickening of the distal esophagus.  Abdomen/Pelvis: The liver has a markedly shrunken appearance and nodular contour, compatible with advanced cirrhosis. No definite focal hepatic lesions are identified on today's examination. Status post cholecystectomy. Pancreas appears atrophic, but there are no definite focal pancreatic lesions. Small calcifications within the spleen likely represent calcified granulomas. The appearance of the adrenal glands is unremarkable bilaterally. Numerous tiny subcentimeter low attenuation lesions in the kidneys bilaterally are too small to definitively characterize. In addition in the right kidney, there are 2 simple cysts, largest of which is in the medial aspect of the lower pole measuring 2.2 cm in diameter. Prominent portosystemic collateral vessels are noted along the left side of the retroperitoneum, apparently communicating between the splenic circulation and the left iliac circulation. Numerous small gastric and esophageal varices are also noted.  There use a right inguinal hernia which appears to contain a short loop of small bowel. Small bowel proximal to this and the stomach all appear  pathologically dilated, compatible with acute bowel obstruction, with bowel loops measuring up to 4.3 cm in diameter. The distal small bowel is decompressed, as is the colon. Numerous colonic diverticulae are noted. Trace volume of ascites. No frank pneumoperitoneum to suggest bowel perforation at this time. Notably, although the loops of bowel immediately adjacent to the hernia demonstrate avid mucosal enhancement, the bowel loop within the hernia demonstrates markedly decreased enhancement, suggesting incarceration and ischemia. There is a small volume of fluid within the right inguinal hernia as well, which is low attenuation.  Musculoskeletal: Status post ORIF in the left femoral neck. There are no aggressive appearing lytic or blastic lesions noted in the visualized portions of the skeleton. T9 vertebral body compression fracture with 60% loss of anterior vertebral body height and 40% loss of posterior vertebral body height appears to be chronic.  Review of the MIP images confirms the above findings.  IMPRESSION: 1. Incarcerated right inguinal hernia containing a short loop of small bowel with evidence of decreased perfusion of the small bowel  loop within the hernia, and proximal small bowel obstruction. Surgical consultation is strongly recommended. 2. No pneumoperitoneum at this time. 3. Small volume of ascites may be reactive, or could relate to underlying chronic liver disease. 4. Advanced cirrhosis, as above. 5. Colonic diverticulosis without findings to suggest acute diverticulitis at this time. 6. Multifocal peribronchovascular interstitial and airspace disease throughout the lung bases bilaterally (right greater than left), likely reflects sequela of recent aspiration. 7. Additional incidental findings, as above. These results were called by telephone at the time of interpretation on 07/13/2013 at 10:07 PM to Dr. Wendall Papa, who verbally acknowledged these results.   Electronically Signed   By: Vinnie Langton M.D.   On: 07/13/2013 22:10    Oren Binet, MD  [facility] Pager:[phone]  If 7PM-7AM, please contact night-coverage [email or web address] Password [email or web address] 07/17/2013, 12:20 PM   LOS: 4 days

## 2013-07-17 NOTE — Progress Notes (Signed)
Active BS but no BM yet. He is uncertain if he has passed flatus. Incisions look OK. Patient examined and I agree with the assessment and plan  Georganna Skeans, MD, MPH, FACS Trauma: [phone] General Surgery: [phone]  07/17/2013 2:58 PM

## 2013-07-17 NOTE — Progress Notes (Signed)
NUTRITION FOLLOW-UP  DOCUMENTATION CODES Per approved criteria  -Not Applicable   INTERVENTION: TPN per pharmacy RD to follow for nutrition care plan  NUTRITION DIAGNOSIS: Inadequate oral intake related to altered GI function as evidenced by NPO status. Ongoing.  Goal: Pt to meet >/= 90% of their estimated nutrition needs. Unmet.  Monitor:  TPN prescription, weight, labs, I/O's  ASSESSMENT: 80 y.o. male with history of HTN, hyperlipidemia, pneumonia, hypothyroidism, GERD, constipation, depression, alcoholic cirrhosis, and history of left hip fracture in January of 2015; presented with cough, nausea and vomiting; chest x-ray showed right lower lobe pneumonia versus aspiration pneumonitis.   CT of abdomen and pelvis with contrast showed incarcerated right inguinal hernia with evidence of decreased perfusion of small bowel within the hernia and proximal small bowel obstruction.   Patient s/p procedures 3/23: REPAIR RIGHT INCARCERATED FEMORAL HERNIA WITH MESH (Right)  EXPLORATORY LAPAROTOMY  SMALL BOWEL RESECTION   NGT to LIS.  Patient is receiving TPN with Clinimix E 5/15 @ 40 ml/hr and lipids @ 10 ml/hr. Provides 1162 kcal and 48 grams protein per day. Meets 61% minimum estimated energy needs and 53% minimum estimated protein needs. PharmD suspects refeeding and does not plan to advance TPN today.  Prealbumin is <3 CBG's: 91, 96, 115 Triglycerides WNL Potassium WNL - ordered for potassium phosphate Magnesium WNL Phosphorus low at 1.9 - ordered for potassium phosphate and sodium phosphate  Height: Ht Readings from Last 1 Encounters:  07/15/13 5\' 8"  (1.727 m)    Weight: Wt Readings from Last 1 Encounters:  07/17/13 151 lb 7.3 oz (68.7 kg)  Admit wt 150 lb  BMI:  Body mass index is 23.03 kg/(m^2). WNL  Estimated Nutritional Needs: Kcal: 1900-2100 Protein: 90-100 gm Fluid: 1.9-2.1 L  Skin: surgical incisions   Diet Order: NPO    Intake/Output Summary (Last 24  hours) at 07/17/13 1611 Last data filed at 07/17/13 1214  Gross per 24 hour  Intake      0 ml  Output    700 ml  Net   -700 ml    Last BM: PTA  Labs:   Recent Labs Lab 07/15/13 0425 07/16/13 1030 07/17/13 0505  NA 133* 132* 131*  K 3.6* 3.9 4.1  CL 100 101 101  CO2 24 23 23   BUN 31* 18 16  CREATININE 0.76 0.54 0.49*  CALCIUM 8.0* 8.0* 8.1*  MG 2.2 2.1 2.1  PHOS 2.1* 1.8* 1.9*  GLUCOSE 120* 109* 95    CBG (last 3)   Recent Labs  07/17/13 0413 07/17/13 0742 07/17/13 1211  GLUCAP 91 96 115*   Prealbumin  Date Value Ref Range Status  07/15/2013 <3.0* 17.0 - 34.0 mg/dL Final     (NOTE)     Performed at:  [facility], [place]  [id number]     REF RANGE 17-34 mg/dl     Performed at [facility]   Triglycerides  Date/Time Value Ref Range Status  07/15/2013  4:25 AM 80  <150 mg/dL Final    Scheduled Meds: . antiseptic oral rinse  15 mL Mouth Rinse q12n4p  . ceFEPime (MAXIPIME) IV  1 g Intravenous 3 times per day  . chlorhexidine  15 mL Mouth Rinse BID  . enoxaparin (LOVENOX) injection  40 mg Subcutaneous Q24H  . insulin aspart  0-9 Units Subcutaneous 4 times per day  . levothyroxine  50 mcg Intravenous QAC breakfast  . pantoprazole (PROTONIX) IV  40 mg Intravenous Q24H  . sodium chloride  10-40 mL Intracatheter Q12H  . sodium phosphate  Dextrose 5% IVPB  21 mmol Intravenous Once  . vancomycin  750 mg Intravenous Q12H    Continuous Infusions: . Marland KitchenTPN (CLINIMIX-E) Adult 40 mL/hr at 07/16/13 1719   And  . fat emulsion 250 mL (07/16/13 1719)  . Marland KitchenTPN (CLINIMIX-E) Adult     And  . fat emulsion      Inda Coke MS, RD, LDN Inpatient Registered Dietitian Pager: [phone] After-hours pager: [phone]

## 2013-07-17 NOTE — Care Management Note (Signed)
    Page 1 of 1   07/21/2013     11:23:45 AM   CARE MANAGEMENT NOTE 07/21/2013  Patient:  Raymond Larsen, Raymond Larsen   Account Number:  [id number]  Date Initiated:  07/14/2013  Documentation initiated by:  Raymond Larsen  Subjective/Objective Assessment:   dx sepsis/SBO  Currently a SNF resident     Action/Plan:   ngtube- awaiting bowel function  TPN   Anticipated DC Date:  07/21/2013   Anticipated DC Plan:  SKILLED NURSING FACILITY  In-house referral  Clinical Social Worker      DC Planning Services  CM consult      Choice offered to / List presented to:             Status of service:  Completed, signed off Medicare Important Message given?   (If response is "NO", the following Medicare IM given date fields will be blank) Date Medicare IM given:   Date Additional Medicare IM given:    Discharge Disposition:  [place]  Per UR Regulation:  Reviewed for med. necessity/level of care/duration of stay  If discussed at [place] of Stay Meetings, dates discussed:    Comments:  07/21/13 [place], Larsen [phone] patient is for dc to [facility], CSW following.  07/17/13 1538 Raymond Larsen [phone] [name], [name]. Patient with ng tube and TPN, awaiting bowel function.

## 2013-07-17 NOTE — Clinical Social Work Psychosocial (Signed)
Clinical Social Work Department BRIEF PSYCHOSOCIAL ASSESSMENT 07/17/2013  Patient:  Raymond Larsen, Raymond Larsen     Account Number:  [id number]     Admit date:  07/13/2013  Clinical Social Worker:  Lovey Newcomer  Date/Time:  07/17/2013 04:50 PM  Referred by:  Physician  Date Referred:  07/17/2013 Referred for  SNF Placement   Other Referral:   Interview type:  Family Other interview type:   Patient and daughter interviewed at bedside.    PSYCHOSOCIAL DATA Living Status:  FACILITY Admitted from facility:  [facility] Level of care:  [place] Primary support name:  Raymond Larsen Primary support relationship to patient:  CHILD, ADULT Degree of support available:   Support is adequate. Raymond Larsen is patient's HPOA. Patient has two other daughters.    CURRENT CONCERNS Current Concerns  Post-Acute Placement   Other Concerns:    SOCIAL WORK ASSESSMENT / PLAN CSW met with patient and daughter at bedside. Patient and daughter confirmed that patient is from [facility]. Patient asked that CSW speak with daughter as his NG tube makes it difficult for him to talk. Patient's daughter Raymond Larsen states that they have been very impressed with the rehab patient has received at [facility] but are not as impressed with the nursing side of the care he has received. CSW validated patient and daughter's concerns and has scheduled a meeting for the patient and daughter to meet with Rockell (admission coordinator at [facility]) to discuss their concerns at 2:00PM on Friday.    At this time patient and daughter plan for patient to return to [facility] at discharge. Patient and daughter state that patient has a room at [facility] in [place]. The ultimate goal is to get patient back to ALF with wife. Patient and daughter were very appreciative of CSW visit.   Assessment/plan status:  Psychosocial Support/Ongoing Assessment of Needs Other assessment/ plan:   Complete  FL2, Fax, Schedule appointment with Rockell to meet with patient and daughter at bedside.   Information/referral to community resources:   CSW contact information given to patient and daughter.    PATIENT'S/FAMILY'S RESPONSE TO PLAN OF CARE: Patient and daughter plan for patient to return to [facility] to complete rehab before returning to [place]. Patient and daughter were pleasant, appropriate, and appreciative of CSW visit. Daughter and patient are happy to know that patient can return to [facility] and that they will be able to voice their concerns to facility's admission coordinator. CSW will assist with DC.       Liz Beach, [place], [place], [phone]

## 2013-07-17 NOTE — Progress Notes (Signed)
Patient ID: Raymond Uhde Sr., male   DOB: 07-15-1922, 80 y.o.   MRN: [id number]   Subjective: The patient is alert to person only and therefore a poor historian.  No recorded BM.  NGT with 343m of bilious output 24h period.  Up with PT/OT.  Does not have IS at bedside.    Objective:  Vital signs:  Filed Vitals:   07/16/13 1412 07/16/13 1948 07/17/13 0438 07/17/13 0552  BP: 136/74 116/69  120/72  Pulse: 92 97  92  Temp: 98.2 F (36.8 C) 98.4 F (36.9 C)  98.9 F (37.2 C)  TempSrc: Oral Oral  Oral  Resp: 18 18  18   Height:      Weight:   151 lb 7.3 oz (68.7 kg)   SpO2: 98% 94%  94%    Last BM Date:  (unknown)  Intake/Output   Yesterday:  03/25 0701 - 03/26 0700 In: -  Out: 300 [Urine:300] This shift:    I/O last 3 completed shifts: In: 135 [I.V.:85] Out: 751[Urine:780]   Physical Exam: General: Pt awake/alert/oriented x1 and in no acute distress.  Chest: coarse. no chest wall pain w good excursion Abdomen: Soft.  Nondistended. Mildly tender at incisions only.  Midline and RLQ incisions with approximated edges, no erythema and intact staples.  No evidence of peritonitis.    Problem List:   Active Problems:   Atrial fibrillation   Hypertension   BPH (benign prostatic hyperplasia)   Hyperlipidemia   CAD (coronary artery disease)   Alcoholic cirrhosis of liver with ascites   Hypothyroidism   Protein-calorie malnutrition, severe   Sepsis   HCAP (healthcare-associated pneumonia)   SBO (small bowel obstruction)   Incarcerated inguinal hernia, unilateral   Dehydration   Nausea and vomiting   Pneumonia   Aspiration pneumonia    Results:   Labs: Results for orders placed during the hospital encounter of 07/13/13 (from the past 467hour(s))  GLUCOSE, CAPILLARY     Status: Abnormal   Collection Time    07/15/13 11:55 AM      Result Value Ref Range   Glucose-Capillary 125 (*) 70 - 99 mg/dL  GLUCOSE, CAPILLARY     Status: Abnormal   Collection Time     07/15/13  3:15 PM      Result Value Ref Range   Glucose-Capillary 120 (*) 70 - 99 mg/dL  GLUCOSE, CAPILLARY     Status: None   Collection Time    07/15/13  7:18 PM      Result Value Ref Range   Glucose-Capillary 98  70 - 99 mg/dL   Comment 1 Documented in Chart     Comment 2 Notify RN    GLUCOSE, CAPILLARY     Status: Abnormal   Collection Time    07/15/13 11:31 PM      Result Value Ref Range   Glucose-Capillary 101 (*) 70 - 99 mg/dL   Comment 1 Notify RN     Comment 2 Documented in Chart    GLUCOSE, CAPILLARY     Status: Abnormal   Collection Time    07/16/13  3:38 AM      Result Value Ref Range   Glucose-Capillary 100 (*) 70 - 99 mg/dL   Comment 1 Notify RN     Comment 2 Documented in Chart    GLUCOSE, CAPILLARY     Status: Abnormal   Collection Time    07/16/13  7:44 AM      Result Value Ref  Range   Glucose-Capillary 106 (*) 70 - 99 mg/dL   Comment 1 Documented in Chart     Comment 2 Notify RN    BASIC METABOLIC PANEL     Status: Abnormal   Collection Time    07/16/13 10:30 AM      Result Value Ref Range   Sodium 132 (*) 137 - 147 mEq/L   Potassium 3.9  3.7 - 5.3 mEq/L   Chloride 101  96 - 112 mEq/L   CO2 23  19 - 32 mEq/L   Glucose, Bld 109 (*) 70 - 99 mg/dL   BUN 18  6 - 23 mg/dL   Comment: DELTA CHECK NOTED   Creatinine, Ser 0.54  0.50 - 1.35 mg/dL   Calcium 8.0 (*) 8.4 - 10.5 mg/dL   GFR calc non Af Amer >90  >90 mL/min   GFR calc Af Amer >90  >90 mL/min   Comment: (NOTE)     The eGFR has been calculated using the CKD EPI equation.     This calculation has not been validated in all clinical situations.     eGFR's persistently <90 mL/min signify possible Chronic Kidney     Disease.  MAGNESIUM     Status: None   Collection Time    07/16/13 10:30 AM      Result Value Ref Range   Magnesium 2.1  1.5 - 2.5 mg/dL  PHOSPHORUS     Status: Abnormal   Collection Time    07/16/13 10:30 AM      Result Value Ref Range   Phosphorus 1.8 (*) 2.3 - 4.6 mg/dL   GLUCOSE, CAPILLARY     Status: Abnormal   Collection Time    07/16/13 11:51 AM      Result Value Ref Range   Glucose-Capillary 120 (*) 70 - 99 mg/dL   Comment 1 Documented in Chart     Comment 2 Notify RN    GLUCOSE, CAPILLARY     Status: Abnormal   Collection Time    07/16/13  4:48 PM      Result Value Ref Range   Glucose-Capillary 109 (*) 70 - 99 mg/dL  GLUCOSE, CAPILLARY     Status: None   Collection Time    07/16/13  7:41 PM      Result Value Ref Range   Glucose-Capillary 99  70 - 99 mg/dL  GLUCOSE, CAPILLARY     Status: Abnormal   Collection Time    07/16/13 11:43 PM      Result Value Ref Range   Glucose-Capillary 107 (*) 70 - 99 mg/dL   Comment 1 Documented in Chart     Comment 2 Notify RN    GLUCOSE, CAPILLARY     Status: None   Collection Time    07/17/13  4:13 AM      Result Value Ref Range   Glucose-Capillary 91  70 - 99 mg/dL  COMPREHENSIVE METABOLIC PANEL     Status: Abnormal   Collection Time    07/17/13  5:05 AM      Result Value Ref Range   Sodium 131 (*) 137 - 147 mEq/L   Potassium 4.1  3.7 - 5.3 mEq/L   Chloride 101  96 - 112 mEq/L   CO2 23  19 - 32 mEq/L   Glucose, Bld 95  70 - 99 mg/dL   BUN 16  6 - 23 mg/dL   Creatinine, Ser 0.49 (*) 0.50 - 1.35 mg/dL   Calcium 8.1 (*)  8.4 - 10.5 mg/dL   Total Protein 4.6 (*) 6.0 - 8.3 g/dL   Albumin 1.6 (*) 3.5 - 5.2 g/dL   AST 29  0 - 37 U/L   ALT 10  0 - 53 U/L   Alkaline Phosphatase 160 (*) 39 - 117 U/L   Total Bilirubin 0.6  0.3 - 1.2 mg/dL   GFR calc non Af Amer >90  >90 mL/min   GFR calc Af Amer >90  >90 mL/min   Comment: (NOTE)     The eGFR has been calculated using the CKD EPI equation.     This calculation has not been validated in all clinical situations.     eGFR's persistently <90 mL/min signify possible Chronic Kidney     Disease.  MAGNESIUM     Status: None   Collection Time    07/17/13  5:05 AM      Result Value Ref Range   Magnesium 2.1  1.5 - 2.5 mg/dL  PHOSPHORUS     Status: Abnormal    Collection Time    07/17/13  5:05 AM      Result Value Ref Range   Phosphorus 1.9 (*) 2.3 - 4.6 mg/dL  GLUCOSE, CAPILLARY     Status: None   Collection Time    07/17/13  7:42 AM      Result Value Ref Range   Glucose-Capillary 96  70 - 99 mg/dL    Imaging / Studies: No results found.  Medications / Allergies: per chart  Antibiotics: Anti-infectives   Start     Dose/Rate Route Frequency Ordered Stop   07/14/13 1000  vancomycin (VANCOCIN) IVPB 750 mg/150 ml premix     750 mg 150 mL/hr over 60 Minutes Intravenous Every 12 hours 07/14/13 0341 07/22/13 0959   07/14/13 0331  ceFEPIme (MAXIPIME) 1 g in dextrose 5 % 50 mL IVPB     1 g 100 mL/hr over 30 Minutes Intravenous 3 times per day 07/14/13 0332 07/22/13 0559   07/13/13 2330  piperacillin-tazobactam (ZOSYN) IVPB 3.375 g  Status:  Discontinued     3.375 g 12.5 mL/hr over 240 Minutes Intravenous  Once 07/13/13 2318 07/13/13 2318   07/13/13 2330  [MAR Hold]  piperacillin-tazobactam (ZOSYN) IVPB 3.375 g     (On MAR Hold since 07/14/13 0037)   3.375 g 12.5 mL/hr over 240 Minutes Intravenous  Once 07/13/13 2318 07/14/13 0048   07/13/13 2115  vancomycin (VANCOCIN) IVPB 1000 mg/200 mL premix     1,000 mg 200 mL/hr over 60 Minutes Intravenous  Once 07/13/13 2112 07/13/13 2257      Assessment/Plan  POD#4 s/p Ex Lap, SBR, Incarcerated RIH repair with mesh  -Continue NGT (367m/24 hours) until clear bowel function returns.  Will consider clamping trial soon. -Pulmonary toilet ABL Anemia - continue to monitor  ID - Vanc/Maxipime  Mult medical issues - per hospitalist  PCM - on TNA  Deconditioning - PT/OT recommending SNF  Disp - Still awaiting bowel function, SNF when medically/surgically stable  EErby Pian ALex[facility] CenterSurgery Pager [phone] [phone] 07/17/2013 9:04 AM

## 2013-07-17 NOTE — Progress Notes (Signed)
Pt evaluated on 3/23 by OT and our recommendation is SNF for follow up OT, further OT needs will best be met at SNF. Acute OT will sign off. PT will continue to follow. Cathy , OTR/L [phone] 07/17/2013   

## 2013-07-17 NOTE — Progress Notes (Signed)
Physical Therapy Treatment Patient Details Name: Challen Spainhour Sr. MRN: [id number] DOB: [birth year] Today's Date: 07/22/2013    History of Present Illness Adm 3/22 with incarcerated Rt inguinal hernia and underwent surgical repair. 2/1 had Lt femur ORIF s/p fall. Was WBAT.    PT Comments    Pt making steady progress.  Follow Up Recommendations  SNF     Equipment Recommendations  None recommended by PT    Recommendations for Other Services       Precautions / Restrictions Precautions Precautions: Fall Precaution Comments: NGtube, abdominal and right inguinal incisions Restrictions LLE Weight Bearing: Weight bearing as tolerated    Mobility  Bed Mobility Overal bed mobility: Needs Assistance Bed Mobility: Supine to Sit     Supine to sit: Mod assist     General bed mobility comments: Assist to bring trunk up.  Transfers Overall transfer level: Needs assistance Equipment used: Rolling walker (2 wheeled) Transfers: Sit to/from Stand Sit to Stand: Mod assist;+2 safety/equipment         General transfer comment: Assist to bring hips up and verbal cues for hand placement.  Ambulation/Gait Ambulation/Gait assistance: Min assist;+2 safety/equipment Ambulation Distance (Feet): 30 Feet (30' x 1, 10' x 1) Assistive device: Rolling walker (2 wheeled) Gait Pattern/deviations: Step-through pattern;Decreased step length - left;Decreased stance time - left;Shuffle;Trunk flexed Gait velocity: very slow   General Gait Details: verbal cues to incr step length and to stay closer to walker.   Stairs            Wheelchair Mobility    Modified Rankin (Stroke Patients Only)       Balance   Sitting-balance support: No upper extremity supported;Feet unsupported Sitting balance-Leahy Scale: Fair     Standing balance support: Bilateral upper extremity supported Standing balance-Leahy Scale: Poor                      Cognition Arousal/Alertness:  Awake/alert Behavior During Therapy: WFL for tasks assessed/performed Overall Cognitive Status: No family/caregiver present to determine baseline cognitive functioning                      Exercises      General Comments        Pertinent Vitals/Pain See flow sheet    Home Living                      Prior Function            PT Goals (current goals can now be found in the care plan section) Progress towards PT goals: Progressing toward goals    Frequency  Min 2X/week    PT Plan Current plan remains appropriate    End of Session Equipment Utilized During Treatment: Gait belt Activity Tolerance: Patient tolerated treatment well Patient left: in chair;with call bell/phone within reach;with chair alarm set     Time: [phone] PT Time Calculation (min): 28 min  Charges:  $Gait Training: 23-37 mins                    G Codes:      Ayodeji Keimig 07-22-2013, 9:46 AM  [facility] PT [phone]

## 2013-07-18 LAB — GLUCOSE, CAPILLARY
GLUCOSE-CAPILLARY: 101 mg/dL — AB (ref 70–99)
GLUCOSE-CAPILLARY: 109 mg/dL — AB (ref 70–99)
GLUCOSE-CAPILLARY: 110 mg/dL — AB (ref 70–99)
GLUCOSE-CAPILLARY: 98 mg/dL (ref 70–99)
Glucose-Capillary: 106 mg/dL — ABNORMAL HIGH (ref 70–99)

## 2013-07-18 LAB — COMPREHENSIVE METABOLIC PANEL
ALT: 10 U/L (ref 0–53)
AST: 31 U/L (ref 0–37)
Albumin: 1.5 g/dL — ABNORMAL LOW (ref 3.5–5.2)
Alkaline Phosphatase: 139 U/L — ABNORMAL HIGH (ref 39–117)
BUN: 16 mg/dL (ref 6–23)
CALCIUM: 8 mg/dL — AB (ref 8.4–10.5)
CO2: 24 mEq/L (ref 19–32)
Chloride: 99 mEq/L (ref 96–112)
Creatinine, Ser: 0.54 mg/dL (ref 0.50–1.35)
GFR calc non Af Amer: >90 mL/min (ref >90)
Glucose, Bld: 106 mg/dL — ABNORMAL HIGH (ref 70–99)
Potassium: 4.2 mEq/L (ref 3.7–5.3)
SODIUM: 128 meq/L — AB (ref 137–147)
TOTAL PROTEIN: 4.4 g/dL — AB (ref 6.0–8.3)
Total Bilirubin: 0.7 mg/dL (ref 0.3–1.2)

## 2013-07-18 LAB — MAGNESIUM: MAGNESIUM: 2 mg/dL (ref 1.5–2.5)

## 2013-07-18 LAB — PHOSPHORUS: PHOSPHORUS: 2.4 mg/dL (ref 2.3–4.6)

## 2013-07-18 MED ORDER — FINASTERIDE 5 MG PO TABS: 5.0000 mg | ORAL_TABLET | Freq: Every day | ORAL | Status: DC

## 2013-07-18 MED ORDER — TRACE MINERALS CR-CU-F-FE-I-MN-MO-SE-ZN IV SOLN
INTRAVENOUS | Status: AC
  Administered 2013-07-18: 17:00:00 via INTRAVENOUS
  Filled 2013-07-18: qty 2000

## 2013-07-18 MED ORDER — FAT EMULSION 20 % IV EMUL
250.0000 mL | INTRAVENOUS | Status: AC
  Administered 2013-07-18: 250 mL via INTRAVENOUS
  Filled 2013-07-18: qty 250

## 2013-07-18 MED ORDER — TAMSULOSIN HCL 0.4 MG PO CAPS
0.4000 mg | ORAL_CAPSULE | Freq: Every day | ORAL | Status: DC
  Administered 2013-07-18 – 2013-07-21 (×4): 0.4 mg via ORAL
  Filled 2013-07-18 (×4): qty 1

## 2013-07-18 MED ORDER — IPRATROPIUM-ALBUTEROL 0.5-2.5 (3) MG/3ML IN SOLN
3.0000 mL | Freq: Three times a day (TID) | RESPIRATORY_TRACT | Status: DC
  Filled 2013-07-18: qty 3

## 2013-07-18 MED ORDER — IPRATROPIUM-ALBUTEROL 0.5-2.5 (3) MG/3ML IN SOLN: 3.0000 mL | Freq: Four times a day (QID) | RESPIRATORY_TRACT | Status: DC | PRN

## 2013-07-18 NOTE — Progress Notes (Signed)
PARENTERAL NUTRITION CONSULT NOTE - FOLLOW UP  Pharmacy Consult for TPN Indication: SBO  Allergies  Allergen Reactions  . Amitriptyline Other (See Comments)    Dizziness   . Beta Adrenergic Blockers Other (See Comments)    lethargy  . Imdur [Isosorbide]     Headache   . Niacin And Related Rash       . Sulfa Antibiotics Itching, Nausea And Vomiting and Rash    Patient Measurements: Height: 5' 8"  (172.7 cm) Weight: 152 lb 1.9 oz (69 kg) IBW/kg (Calculated) : 68.4  Vital Signs: Temp: 98.6 F (37 C) (03/26 2120) Temp src: Oral (03/26 2120) BP: 98/63 mmHg (03/26 2120) Pulse Rate: 92 (03/26 2120) Intake/Output from previous day: 03/26 0701 - 03/27 0700 In: 300 [IV Piggyback:300] Out: 975 [Urine:725; Emesis/NG output:250] Intake/Output from this shift:    Labs: No results found for this basename: WBC, HGB, HCT, PLT, APTT, INR,  in the last 72 hours   Recent Labs  07/16/13 1030 07/17/13 0505 07/18/13 0450  NA 132* 131* 128*  K 3.9 4.1 4.2  CL 101 101 99  CO2 23 23 24   GLUCOSE 109* 95 106*  BUN 18 16 16   CREATININE 0.54 0.49* 0.54  CALCIUM 8.0* 8.1* 8.0*  MG 2.1 2.1 2.0  PHOS 1.8* 1.9* 2.4  PROT  --  4.6* 4.4*  ALBUMIN  --  1.6* 1.5*  AST  --  29 31  ALT  --  10 10  ALKPHOS  --  160* 139*  BILITOT  --  0.6 0.7   Estimated Creatinine Clearance: 61.8 ml/min (by C-G formula based on Cr of 0.54).    Recent Labs  07/17/13 1802 07/17/13 2352 07/18/13 0600  GLUCAP 99 98 106*    Insulin Requirements in the past 24 hours:  No SSI required  Current Nutrition:  Clinimix E 5/15 at 40 ml/hr + 20% lipid emulsion at 10 ml/hr - provides 48 g protein and 1162 kCal per day Clear liquids ordered 3/27 am  Assessment: 89 YOM from SNF admitted with several days of N/V. With right sided hernia for quite awhile, usually soft. Found to have SBO and taken to OR for hernia repair, ex lap and small bowel resection 3/23.  POD # 5.   GI: area of necrotic small bowel  that would not reduce along with necrotic hernia sac and omentum. Small BM this am. New order to remove NG tube and start clears.   Endo: hx hypothyroidism- TSH low in Nov 2014; no hx DM, CBGs well-controlled and requiring minimal to no SSI; On IV synthroid.    Lytes: Na 128, low, K 4.2, Phos up to 2.4 after 21 MM na phos yesterday, Mag 2.0.    Renal: SCr 0.54  Pulm: 2L [place]   Cards: hx AFib- taken off warfarin in Janary 2014 d/t high fall risk; also with hx HTN, HLD and angina; VSS  Hepatobil: noted hx cirrhosis with ascites and esophageal varices; alk phos elevated at 139, albumin low at 1.5. Other LFTs are WNL. Note INR is 1.78 in absence of warfarin.  Triglycerides 80, Prealbumin <3 indicative of poor nutritional status.  Neuro: typically A&O, having some moments of confusion. GCS 20  ID: presumed HCAP-  vanc/cefepime stopped 3/27.   Best Practices: IV PPI, Lovenox, SCDs   TPN Access: PICC placed 3/23   TPN day#: 5 (started 3/23)  Nutritional Goals:  1900-2100 kCal, 90-100 grams of protein per day  Plan:  Increase Clinimix E 5/15 to  60 mL/hr + 20% lipid emulsion at 61m/hr- this will provide 72 gm protein and 1502 kCal per day Daily IV multivitamin; Trace elements MWF only d/t national shortage  Continue CBGs and sensitive SSI q6h Am BMET, mg, phos F/u resolution of ileus If not able to advance PO diet tomorrow and he tolerates TPN rate advancing to 60 ml/hr will increase TPN to goal rate of 83 ml/hr on Saturday  MEudelia Bunch Pharm.D. [phone]/27/2015 9:17 AM   CMET, Mag, Phos with AM labs

## 2013-07-18 NOTE — Progress Notes (Addendum)
PATIENT DETAILS Name: Raymond Bas Sr. Age: 80 y.o. Sex: male Date of Birth: 1922-05-10 Admit Date: 07/13/2013 Admitting Physician Stark Klein, MD [id number] H, MD  Subjective: Now passing flatus, had a small BM this am  Assessment/Plan: Active Problems: Sepsis - Secondary to presumed aspiration pneumonia and incarcerated inguinal hernia - Resolved with antibiotics, and laparotomy. - Was on vancomycin (since 3/22) and cefepime (since 3/23)- has completed antibiotic course on 3/27 - Blood culture on 07/13/13-negative  Suspected aspiration pneumonitis versus HCAP - Afebrile, and with no leukocytosis - Was on vancomycin (since 3/22) and cefepime (since 3/23)- has completed antibiotic course on 3/27 - Blood culture on 07/13/13-negative - Before resuming diet, may need a SLP evaluation.  SBO - s/p Ex Lap, Incarcerated RIH repair with mesh  -POD #5 - NG reomved on 3/27,started on clears. Current on TNA- will need to stop when tolerating oral diet - General surgery following  History of Atrial fibrillation -Rate controlled. Not anticoagulated due to history of bleeding last year - Currently not on any rate controlling agents, will resume Lopressor when able.  Hypothyroidism - Continue with levothyroxine intravenously, changed to oral route when able.  Hyponatremia -?etiology -repeat labs in am-if still persistent will commence further work up  BPH - Currently with a Foley catheter in place-will attempt a voiding trial in next few days - Resume finasteride when oral intake resumes  History of alcoholic liver cirrhosis - Resume diuretics when able.  History of gastroesophageal reflux disease - Continue PPI  Disposition: Remain inpatient- will need to go to SNF on discharge  DVT Prophylaxis: Prophylactic Lovenox  Code Status:  DNR  Family Communication Daughter Ms Staley-3/27 over the phone  Procedures: Repair strangulated incarcerated right femoral  hernia, exploratory laparotomy with small bowel resection on 3/23  CONSULTS:  general surgery  MEDICATIONS: Scheduled Meds: . antiseptic oral rinse  15 mL Mouth Rinse q12n4p  . chlorhexidine  15 mL Mouth Rinse BID  . enoxaparin (LOVENOX) injection  40 mg Subcutaneous Q24H  . insulin aspart  0-9 Units Subcutaneous 4 times per day  . levothyroxine  50 mcg Intravenous QAC breakfast  . pantoprazole (PROTONIX) IV  40 mg Intravenous Q24H  . sodium chloride  10-40 mL Intracatheter Q12H   Continuous Infusions: . Marland KitchenTPN (CLINIMIX-E) Adult 40 mL/hr at 07/17/13 1701   And  . fat emulsion 250 mL (07/17/13 1701)  . Marland KitchenTPN (CLINIMIX-E) Adult     And  . fat emulsion     PRN Meds:.acetaminophen, acetaminophen, meclizine, morphine injection, ondansetron (ZOFRAN) IV, ondansetron, sodium chloride  Antibiotics: Anti-infectives   Start     Dose/Rate Route Frequency Ordered Stop   07/14/13 1000  vancomycin (VANCOCIN) IVPB 750 mg/150 ml premix  Status:  Discontinued     750 mg 150 mL/hr over 60 Minutes Intravenous Every 12 hours 07/14/13 0341 07/18/13 0829   07/14/13 0331  ceFEPIme (MAXIPIME) 1 g in dextrose 5 % 50 mL IVPB  Status:  Discontinued     1 g 100 mL/hr over 30 Minutes Intravenous 3 times per day 07/14/13 0332 07/18/13 0829   07/13/13 2330  piperacillin-tazobactam (ZOSYN) IVPB 3.375 g  Status:  Discontinued     3.375 g 12.5 mL/hr over 240 Minutes Intravenous  Once 07/13/13 2318 07/13/13 2318   07/13/13 2330  [MAR Hold]  piperacillin-tazobactam (ZOSYN) IVPB 3.375 g     (On MAR Hold since 07/14/13 0037)   3.375 g 12.5 mL/hr over 240 Minutes Intravenous  Once 07/13/13 2318 07/14/13 0048   07/13/13 2115  vancomycin (VANCOCIN) IVPB 1000 mg/200 mL premix     1,000 mg 200 mL/hr over 60 Minutes Intravenous  Once 07/13/13 2112 07/13/13 2257       PHYSICAL EXAM: Vital signs in last 24 hours: Filed Vitals:   07/17/13 0925 07/17/13 1312 07/17/13 2120 07/18/13 0657  BP:  111/75 98/63   Pulse:   99 92   Temp:  98.4 F (36.9 C) 98.6 F (37 C)   TempSrc:  Oral Oral   Resp:  18 13   Height:      Weight:    69 kg (152 lb 1.9 oz)  SpO2: 92% 96% 95%     Weight change: 0.3 kg (10.6 oz) Filed Weights   07/16/13 0359 07/17/13 0438 07/18/13 0657  Weight: 67.6 kg (149 lb 0.5 oz) 68.7 kg (151 lb 7.3 oz) 69 kg (152 lb 1.9 oz)   Body mass index is 23.13 kg/(m^2).   Gen Exam: Awake and pleasantly confused Neck: Supple, No JVD.   Chest: Bilateral air entry, coarse by basilar rales CVS: S1 S2 Regular, no murmurs.  Abdomen: soft, BS + appropriately tender, non distended- dry dressing Extremities: no edema, lower extremities warm to touch. Neurologic: Non Focal.   Skin: No Rash.   Wounds: N/A.    Intake/Output from previous day:  Intake/Output Summary (Last 24 hours) at 07/18/13 1034 Last data filed at 07/18/13 1018  Gross per 24 hour  Intake    300 ml  Output    975 ml  Net   -675 ml     LAB RESULTS: CBC  Recent Labs Lab 07/13/13 1940 07/14/13 0508 07/15/13 0425  WBC 15.6* 8.0 10.0  HGB 13.1 11.4* 9.8*  HCT 37.1* 32.8* 28.0*  PLT 215 166 163  MCV 90.0 91.1 91.2  MCH 31.8 31.7 31.9  MCHC 35.3 34.8 35.0  RDW 15.5 15.9* 16.0*  LYMPHSABS 0.9  --  1.6  MONOABS 0.8  --  0.8  EOSABS 0.0  --  0.6  BASOSABS 0.0  --  0.0    Chemistries   Recent Labs Lab 07/14/13 0508 07/15/13 0425 07/16/13 1030 07/17/13 0505 07/18/13 0450  NA 132* 133* 132* 131* 128*  K 4.1 3.6* 3.9 4.1 4.2  CL 96 100 101 101 99  CO2 23 24 23 23 24   GLUCOSE 92 120* 109* 95 106*  BUN 30* 31* 18 16 16   CREATININE 0.91 0.76 0.54 0.49* 0.54  CALCIUM 8.6 8.0* 8.0* 8.1* 8.0*  MG 1.9 2.2 2.1 2.1 2.0    CBG:  Recent Labs Lab 07/17/13 1211 07/17/13 1603 07/17/13 1802 07/17/13 2352 07/18/13 0600  GLUCAP 115* 109* 99 98 106*    GFR Estimated Creatinine Clearance: 61.8 ml/min (by C-G formula based on Cr of 0.54).  Coagulation profile  Recent Labs Lab 07/13/13 2232  INR 1.78*     Cardiac Enzymes No results found for this basename: CK, CKMB, TROPONINI, MYOGLOBIN,  in the last 168 hours  No components found with this basename: POCBNP,  No results found for this basename: DDIMER,  in the last 72 hours No results found for this basename: HGBA1C,  in the last 72 hours No results found for this basename: CHOL, HDL, LDLCALC, TRIG, CHOLHDL, LDLDIRECT,  in the last 72 hours No results found for this basename: TSH, T4TOTAL, FREET3, T3FREE, THYROIDAB,  in the last 72 hours No results found for this basename: VITAMINB12, FOLATE, FERRITIN, TIBC, IRON, RETICCTPCT,  in the last 72 hours No results found for this basename: LIPASE, AMYLASE,  in the last 72 hours  Urine Studies No results found for this basename: UACOL, UAPR, USPG, UPH, UTP, UGL, UKET, UBIL, UHGB, UNIT, UROB, ULEU, UEPI, UWBC, URBC, UBAC, CAST, CRYS, UCOM, BILUA,  in the last 72 hours  MICROBIOLOGY: Recent Results (from the past 240 hour(s))  CULTURE, BLOOD (ROUTINE X 2)     Status: None   Collection Time    07/13/13  8:40 PM      Result Value Ref Range Status   Specimen Description BLOOD RIGHT ARM   Final   Special Requests BOTTLES DRAWN AEROBIC AND ANAEROBIC 10CC EACH   Final   Culture  Setup Time     Final   Value: 07/14/2013 01:55     Performed at [facility]   Culture     Final   Value:        BLOOD CULTURE RECEIVED NO GROWTH TO DATE CULTURE WILL BE HELD FOR 5 DAYS BEFORE ISSUING A FINAL NEGATIVE REPORT     Performed at [facility]   Report Status PENDING   Incomplete  CULTURE, BLOOD (ROUTINE X 2)     Status: None   Collection Time    07/13/13  8:45 PM      Result Value Ref Range Status   Specimen Description BLOOD RIGHT HAND   Final   Special Requests BOTTLES DRAWN AEROBIC ONLY 10CC   Final   Culture  Setup Time     Final   Value: 07/14/2013 01:55     Performed at [facility]   Culture     Final   Value:        BLOOD CULTURE RECEIVED NO GROWTH TO DATE CULTURE WILL  BE HELD FOR 5 DAYS BEFORE ISSUING A FINAL NEGATIVE REPORT     Performed at [facility]   Report Status PENDING   Incomplete  URINE CULTURE     Status: None   Collection Time    07/14/13  3:35 AM      Result Value Ref Range Status   Specimen Description URINE, CATHETERIZED   Final   Special Requests NONE   Final   Culture  Setup Time     Final   Value: 07/14/2013 10:14     Performed at [facility]     Final   Value: NO GROWTH     Performed at [facility]   Culture     Final   Value: NO GROWTH     Performed at [facility]   Report Status 07/15/2013 FINAL   Final  MRSA PCR SCREENING     Status: None   Collection Time    07/14/13  3:37 AM      Result Value Ref Range Status   MRSA by PCR NEGATIVE  NEGATIVE Final   Comment:            The GeneXpert MRSA Assay (FDA     approved for NASAL specimens     only), is one component of a     comprehensive MRSA colonization     surveillance program. It is not     intended to diagnose MRSA     infection nor to guide or     monitor treatment for     MRSA infections.    RADIOLOGY STUDIES/RESULTS: Dg Chest 2 View  07/13/2013   CLINICAL DATA:  Fever, abnormal EKG  EXAM: CHEST  2 VIEW  COMPARISON:  DG CHEST 1 VIEW dated 05/28/2013  FINDINGS: There is right lower lobe airspace disease. There is no other focal parenchymal opacity. No pleural effusion or pneumothorax. The heart and mediastinal contours are unremarkable.  Right shoulder arthroplasty. Osteoarthritis of the left glenohumeral joint.  IMPRESSION: Right lower lobe pneumonia. Recommend followup radiography in 4-6 weeks, to document complete resolution following adequate medical therapy. If there is not complete resolution, then recommend further evaluation with CT of the chest to exclude underlying pathology.   Electronically Signed   By: Kathreen Devoid   On: 07/13/2013 20:24   Ct Cta Abd/pel W/cm &/or W/o Cm  07/13/2013   CLINICAL DATA:   Nausea and vomiting.  Poor p.o. intake.  EXAM: CT ANGIOGRAPHY ABDOMEN AND PELVIS WITH CONTRAST AND WITHOUT CONTRAST  TECHNIQUE: Multidetector CT imaging of the abdomen and pelvis was performed using the standard protocol during bolus administration of intravenous contrast. Multiplanar reconstructed images and MIPs were obtained and reviewed to evaluate the vascular anatomy.  CONTRAST:  169mL OMNIPAQUE IOHEXOL 350 MG/ML SOLN  COMPARISON:  CT of the abdomen and pelvis 03/09/2012.  FINDINGS: Lung Bases: Multifocal peribronchovascular interstitial and airspace disease throughout the lower lobes of the lungs bilaterally, and the dependent portion of the right middle lobe, favored to reflect sequela of aspiration with aspiration pneumonitis/pneumonia. Mild cardiomegaly. Calcifications of the mitral annulus and aortic valve. Circumferential thickening of the distal esophagus.  Abdomen/Pelvis: The liver has a markedly shrunken appearance and nodular contour, compatible with advanced cirrhosis. No definite focal hepatic lesions are identified on today's examination. Status post cholecystectomy. Pancreas appears atrophic, but there are no definite focal pancreatic lesions. Small calcifications within the spleen likely represent calcified granulomas. The appearance of the adrenal glands is unremarkable bilaterally. Numerous tiny subcentimeter low attenuation lesions in the kidneys bilaterally are too small to definitively characterize. In addition in the right kidney, there are 2 simple cysts, largest of which is in the medial aspect of the lower pole measuring 2.2 cm in diameter. Prominent portosystemic collateral vessels are noted along the left side of the retroperitoneum, apparently communicating between the splenic circulation and the left iliac circulation. Numerous small gastric and esophageal varices are also noted.  There use a right inguinal hernia which appears to contain a short loop of small bowel. Small bowel  proximal to this and the stomach all appear pathologically dilated, compatible with acute bowel obstruction, with bowel loops measuring up to 4.3 cm in diameter. The distal small bowel is decompressed, as is the colon. Numerous colonic diverticulae are noted. Trace volume of ascites. No frank pneumoperitoneum to suggest bowel perforation at this time. Notably, although the loops of bowel immediately adjacent to the hernia demonstrate avid mucosal enhancement, the bowel loop within the hernia demonstrates markedly decreased enhancement, suggesting incarceration and ischemia. There is a small volume of fluid within the right inguinal hernia as well, which is low attenuation.  Musculoskeletal: Status post ORIF in the left femoral neck. There are no aggressive appearing lytic or blastic lesions noted in the visualized portions of the skeleton. T9 vertebral body compression fracture with 60% loss of anterior vertebral body height and 40% loss of posterior vertebral body height appears to be chronic.  Review of the MIP images confirms the above findings.  IMPRESSION: 1. Incarcerated right inguinal hernia containing a short loop of small bowel with evidence of decreased perfusion of the small bowel loop within the hernia, and proximal  small bowel obstruction. Surgical consultation is strongly recommended. 2. No pneumoperitoneum at this time. 3. Small volume of ascites may be reactive, or could relate to underlying chronic liver disease. 4. Advanced cirrhosis, as above. 5. Colonic diverticulosis without findings to suggest acute diverticulitis at this time. 6. Multifocal peribronchovascular interstitial and airspace disease throughout the lung bases bilaterally (right greater than left), likely reflects sequela of recent aspiration. 7. Additional incidental findings, as above. These results were called by telephone at the time of interpretation on 07/13/2013 at 10:07 PM to Dr. Wendall Papa, who verbally acknowledged these  results.   Electronically Signed   By: Vinnie Langton M.D.   On: 07/13/2013 22:10    Oren Binet, MD  [facility] Pager:[phone]  If 7PM-7AM, please contact night-coverage [email or web address] Password [facility] 07/18/2013, 10:34 AM   LOS: 5 days

## 2013-07-18 NOTE — Progress Notes (Signed)
5 Days Post-Op  Subjective: Small BM this AM  Objective: Vital signs in last 24 hours: Temp:  [98.4 F (36.9 C)-98.6 F (37 C)] 98.6 F (37 C) (03/26 2120) Pulse Rate:  [92-99] 92 (03/26 2120) Resp:  [13-18] 13 (03/26 2120) BP: (98-111)/(63-75) 98/63 mmHg (03/26 2120) SpO2:  [92 %-96 %] 95 % (03/26 2120) Weight:  [152 lb 1.9 oz (69 kg)] 152 lb 1.9 oz (69 kg) (03/27 0657) Last BM Date:  (patient is unsure)  Intake/Output from previous day: 03/26 0701 - 03/27 0700 In: 300 [IV Piggyback:300] Out: 975 [Urine:725; Emesis/NG output:250] Intake/Output this shift:    General appearance: cooperative Resp: clear to auscultation bilaterally Cardio: S1, S2 normal GI: soft, active BS, incisions CDI Some testicular edema  Lab Results:  No results found for this basename: WBC, HGB, HCT, PLT,  in the last 72 hours BMET  Recent Labs  07/17/13 0505 07/18/13 0450  NA 131* 128*  K 4.1 4.2  CL 101 99  CO2 23 24  GLUCOSE 95 106*  BUN 16 16  CREATININE 0.49* 0.54  CALCIUM 8.1* 8.0*   PT/INR No results found for this basename: LABPROT, INR,  in the last 72 hours ABG No results found for this basename: PHART, PCO2, PO2, HCO3,  in the last 72 hours  Studies/Results: No results found.  Anti-infectives: Anti-infectives   Start     Dose/Rate Route Frequency Ordered Stop   07/14/13 1000  vancomycin (VANCOCIN) IVPB 750 mg/150 ml premix     750 mg 150 mL/hr over 60 Minutes Intravenous Every 12 hours 07/14/13 0341 07/22/13 0959   07/14/13 0331  ceFEPIme (MAXIPIME) 1 g in dextrose 5 % 50 mL IVPB     1 g 100 mL/hr over 30 Minutes Intravenous 3 times per day 07/14/13 0332 07/22/13 0559   07/13/13 2330  piperacillin-tazobactam (ZOSYN) IVPB 3.375 g  Status:  Discontinued     3.375 g 12.5 mL/hr over 240 Minutes Intravenous  Once 07/13/13 2318 07/13/13 2318   07/13/13 2330  [MAR Hold]  piperacillin-tazobactam (ZOSYN) IVPB 3.375 g     (On MAR Hold since 07/14/13 0037)   3.375 g 12.5 mL/hr  over 240 Minutes Intravenous  Once 07/13/13 2318 07/14/13 0048   07/13/13 2115  vancomycin (VANCOCIN) IVPB 1000 mg/200 mL premix     1,000 mg 200 mL/hr over 60 Minutes Intravenous  Once 07/13/13 2112 07/13/13 2257      Assessment/Plan: s/p Procedure(s): REPAIR RIGHT INCARCERATED FEMORAL HERNIA WITH MESH (Right) EXPLORATORY LAPAROTOMY (N/A) SMALL BOWEL RESECTION (N/A) POD#5 s/p Ex Lap, SBR, Incarcerated RIH repair with mesh  D/C NGT and start clears ID - Vanc/Maxipime - will stop Mult medical issues - per hospitalist  PCM - on TNA  Deconditioning - PT/OT recommending SNF  Disp -  SNF when medically/surgically stable  LOS: 5 days    Raymond Larsen E 07/18/2013

## 2013-07-18 NOTE — Evaluation (Signed)
Clinical/Bedside Swallow Evaluation Patient Details  Name: Raymond Tolliver Sr. MRN: [id number] Date of Birth: Mar 28, 1923  Today's Date: 07/18/2013 Time: 4132-4401 SLP Time Calculation (min): 21 min  Past Medical History:  Past Medical History  Diagnosis Date  . Arthritis   . Hyperlipidemia   . Hypertension   . A-fib     Dr Wynonia Lawman; WAS on Coumadin until 04/2012  . Angina     uses NTG prn  . Pneumonia     12 /2012  . Recurrent upper respiratory infection (URI)     03/2011  . Hypothyroidism     x 10 yrs  . GERD (gastroesophageal reflux disease)   . Constipation, chronic   . Depression   . History of dizziness   . Anxiety   . Urinary frequency   . Hearing loss of both ears     wears hearing aides  . BPH with obstruction/lower urinary tract symptoms     sees Dr Gaynelle Arabian  . Compression fracture of thoracic vertebra, non-traumatic 06/14/12    T6 and T7 (noted on CT angio chest to r/o PE)  . Alcoholic cirrhosis of liver with ascites 06/14/12    Noted on CT angio chest to r/o PE  . Esophageal varices in alcoholic cirrhosis "   "    "   "    "  . Intertrochanteric fracture of left hip 05/24/2013   Past Surgical History:  Past Surgical History  Procedure Laterality Date  . Total knee arthroplasty  1994    right  . Total shoulder replacement  1997    right  . Joint replacement      Rt knee and shoulder  . Hernia repair  1990    LIH repair  . Cholecystectomy  2004  . Inguinal hernia repair  05/09/2011    Procedure: HERNIA REPAIR INGUINAL ADULT;  Surgeon: Odis Hollingshead, MD;  Location: [place];  Service: General;  Laterality: Right;  . Femur im nail Left 05/25/2013    Procedure: INTRAMEDULLARY (IM) NAIL FEMORAL;  Surgeon: Johnny Bridge, MD;  Location: [place];  Service: Orthopedics;  Laterality: Left;  . Femoral hernia repair Right 07/13/2013    Procedure: REPAIR RIGHT INCARCERATED FEMORAL HERNIA WITH MESH;  Surgeon: Stark Klein, MD;  Location: [place];  Service: General;   Laterality: Right;  . Laparotomy N/A 07/13/2013    Procedure: EXPLORATORY LAPAROTOMY;  Surgeon: Stark Klein, MD;  Location: [place];  Service: General;  Laterality: N/A;  . Bowel resection N/A 07/13/2013    Procedure: SMALL BOWEL RESECTION;  Surgeon: Stark Klein, MD;  Location: Uncertain;  Service: General;  Laterality: N/A;   HPI:  Raymond Ohlrich Sr. is a 80 y.o. Caucasian male with history of hypertension, hyperlipidemia, A. fib not on anticoagulation due to bleeding in 2014, history of pneumonia, hypothyroidism, GERD, constipation, depression, history of alcoholic cirrhosis, and history of left hip fracture in January of 2015 status post surgery. Pt admitted from [facility] rehab with RLL pna vs aspiration pneumonitis. Also found to have incarcerated right inguinal hernia and SBO. Underwent repair on 3/23. Started on clear liquids with ongoing TNA. SLP to eval for diet. Pt has MBS on 03/05/13 showing chronic multifactorial dysphagia, Zenker's Diverticulum. Recommended Dys 2/thin with cued multiple swallows.   Assessment / Plan / Recommendation Clinical Impression  Pt presentation similar to function observed in 2014 MBS. Pt has a chronic oropharyngeal and cervical esophageal dysphagia with a Zenkers diverticulum. Pt has some baseline compensatory strategies that he  uses without self awareness (prolonged laryngeal elevation and mutliple swallows. He does demosntrate ongoing signs of aspiration, but thickened liquids do not improve safety based on MBS due to residuals. Best strategies for reducing risk are upright posture, multiple swallows and occasional throat clear. Pt will need assistance to follow these precautions and even so , aspiration risk is moderate. Recommend Dys 2 (fine chop) diet with thin liquids and precautions. SLP will f/u for further tolerance and education.     Aspiration Risk  Moderate    Diet Recommendation Dysphagia 2 (Fine chop);Thin liquid   Liquid Administration via: Cup Medication  Administration: Whole meds with puree Supervision: Staff to assist with self feeding Compensations: Slow rate;Small sips/bites;Multiple dry swallows after each bite/sip;Clear throat intermittently Postural Changes and/or Swallow Maneuvers: Seated upright 90 degrees    Other  Recommendations Oral Care Recommendations: Oral care BID   Follow Up Recommendations  Skilled Nursing facility    Frequency and Duration min 2x/week  2 weeks   Pertinent Vitals/Pain NA    SLP Swallow Goals     Swallow Study Prior Functional Status       General HPI: Raymond Urbanek Sr. is a 80 y.o. Caucasian male with history of hypertension, hyperlipidemia, A. fib not on anticoagulation due to bleeding in 2014, history of pneumonia, hypothyroidism, GERD, constipation, depression, history of alcoholic cirrhosis, and history of left hip fracture in January of 2015 status post surgery. Pt admitted from [facility] rehab with RLL pna vs aspiration pneumonitis. Also found to have incarcerated right inguinal hernia and SBO. Underwent repair on 3/23. Started on clear liquids with ongoing TNA. SLP to eval for diet. Pt has MBS on 03/05/13 showing chronic multifactorial dysphagia, Zenker's Diverticulum. Recommended Dys 2/thin with cued multiple swallows. Type of Study: Bedside swallow evaluation Previous Swallow Assessment: see HPI Diet Prior to this Study: Thin liquids Temperature Spikes Noted: No Respiratory Status: Room air History of Recent Intubation: No Behavior/Cognition: Alert;Cooperative Oral Cavity - Dentition: Dentures, top;Edentulous (no bottom denutre) Self-Feeding Abilities: Able to feed self Patient Positioning: Upright in bed (itled bed to achieve upright position without bending) Baseline Vocal Quality: Wet Volitional Cough: Congested Volitional Swallow: Able to elicit    Oral/Motor/Sensory Function Overall Oral Motor/Sensory Function: Appears within functional limits for tasks assessed   Ice Chips     Thin  Liquid Thin Liquid: Impaired Presentation: Cup;Straw Pharyngeal  Phase Impairments: Multiple swallows;Wet Vocal Quality;Throat Clearing - Delayed    Nectar Thick Nectar Thick Liquid: Not tested   Honey Thick Honey Thick Liquid: Not tested   Puree Puree: Impaired Presentation: Spoon Pharyngeal Phase Impairments: Multiple swallows   Solid   GO    Solid: Impaired Presentation: Self Fed Oral Phase Impairments: Impaired mastication (missing dentition) Pharyngeal Phase Impairments: Multiple swallows      Herbie Baltimore, MA CCC-SLP [phone]  Marlia Schewe, Katherene Ponto 07/18/2013,1:38 PM

## 2013-07-19 LAB — BASIC METABOLIC PANEL
BUN: 19 mg/dL (ref 6–23)
CHLORIDE: 99 meq/L (ref 96–112)
CO2: 23 meq/L (ref 19–32)
Calcium: 8.2 mg/dL — ABNORMAL LOW (ref 8.4–10.5)
Creatinine, Ser: 0.55 mg/dL (ref 0.50–1.35)
GFR calc non Af Amer: >90 mL/min (ref >90)
Glucose, Bld: 115 mg/dL — ABNORMAL HIGH (ref 70–99)
POTASSIUM: 4.5 meq/L (ref 3.7–5.3)
SODIUM: 128 meq/L — AB (ref 137–147)

## 2013-07-19 LAB — GLUCOSE, CAPILLARY
Glucose-Capillary: 107 mg/dL — ABNORMAL HIGH (ref 70–99)
Glucose-Capillary: 114 mg/dL — ABNORMAL HIGH (ref 70–99)
Glucose-Capillary: 106 mg/dL — ABNORMAL HIGH (ref 70–99)

## 2013-07-19 LAB — MAGNESIUM: MAGNESIUM: 2.1 mg/dL (ref 1.5–2.5)

## 2013-07-19 LAB — PHOSPHORUS: PHOSPHORUS: 1.9 mg/dL — AB (ref 2.3–4.6)

## 2013-07-19 MED ORDER — POLYETHYLENE GLYCOL 3350 17 G PO PACK
17.0000 g | PACK | Freq: Every day | ORAL | Status: DC
  Administered 2013-07-19 – 2013-07-21 (×2): 17 g via ORAL
  Filled 2013-07-19 (×3): qty 1

## 2013-07-19 MED ORDER — SODIUM PHOSPHATE 3 MMOLE/ML IV SOLN
15.0000 mmol | Freq: Once | INTRAVENOUS | Status: AC
  Administered 2013-07-19: 15 mmol via INTRAVENOUS
  Filled 2013-07-19: qty 5

## 2013-07-19 MED ORDER — LEVOTHYROXINE SODIUM 100 MCG PO TABS
100.0000 ug | ORAL_TABLET | Freq: Every day | ORAL | Status: DC
  Administered 2013-07-20 – 2013-07-21 (×2): 100 ug via ORAL
  Filled 2013-07-19 (×3): qty 1

## 2013-07-19 MED ORDER — FAT EMULSION 20 % IV EMUL
250.0000 mL | INTRAVENOUS | Status: AC
  Administered 2013-07-19: 250 mL via INTRAVENOUS
  Filled 2013-07-19: qty 250

## 2013-07-19 MED ORDER — DOCUSATE SODIUM 100 MG PO CAPS
100.0000 mg | ORAL_CAPSULE | Freq: Two times a day (BID) | ORAL | Status: DC
  Administered 2013-07-19 – 2013-07-21 (×4): 100 mg via ORAL
  Filled 2013-07-19 (×8): qty 1

## 2013-07-19 MED ORDER — SODIUM CHLORIDE 0.9 % IV BOLUS (SEPSIS)
500.0000 mL | Freq: Once | INTRAVENOUS | Status: AC
  Administered 2013-07-19: 500 mL via INTRAVENOUS

## 2013-07-19 MED ORDER — M.V.I. ADULT IV INJ
INJECTION | INTRAVENOUS | Status: AC
  Administered 2013-07-19: 19:00:00 via INTRAVENOUS
  Filled 2013-07-19: qty 2000

## 2013-07-19 NOTE — Progress Notes (Signed)
Residual from the in and out cath was only 110cc. Contacting NP, Rogue Bussing to see if any furthers orders are needed.

## 2013-07-19 NOTE — Progress Notes (Signed)
PATIENT DETAILS Name: Raymond Situ Sr. Age: 80 y.o. Sex: male Date of Birth: October 20, 1922 Admit Date: 07/13/2013 Admitting Physician Stark Klein, MD [id number] H, MD  Subjective: Now passing flatus, had a small BM yesterday  Assessment/Plan: Active Problems: Sepsis - Secondary to presumed aspiration pneumonia and incarcerated inguinal hernia - Resolved with antibiotics, and laparotomy. - Was on vancomycin (since 3/22) and cefepime (since 3/23)- has completed antibiotic course on 3/27 - Blood culture on 07/13/13-negative  Suspected aspiration pneumonitis versus HCAP - Afebrile, and with no leukocytosis - Was on vancomycin (since 3/22) and cefepime (since 3/23)- has completed antibiotic course on 3/27 - Blood culture on 07/13/13-negative -  SLP evaluation done on 3/27-recommendations are for Dys 2 diet with thin liquids when ready, for now continue with clear liquids and advance as tolerated  SBO - s/p Ex Lap, Incarcerated RIH repair with mesh  -POD #6 - NG reomved on 3/27,tolerating clears. Current on TNA- will need to stop when tolerating oral diet - General surgery following  History of Atrial fibrillation -Rate controlled. Not anticoagulated due to history of bleeding last year - Currently not on any rate controlling agents, will resume Lopressor when able.  Hypothyroidism - Continue with levothyroxine intravenously, changed to oral route when able.  Hyponatremia -?etiology -repeat labs in am-if still persistent will commence further work up  BPH - Currently with a Foley catheter in place-will attempt a voiding trial in next few days - Resume finasteride when oral intake resumes  History of alcoholic liver cirrhosis - Resume diuretics when able.  History of gastroesophageal reflux disease - Continue PPI  Disposition: Remain inpatient- will need to go to SNF on discharge  DVT Prophylaxis: Prophylactic Lovenox  Code Status:  DNR  Family  Communication Daughter Ms Staley-3/27 over the phone  Procedures: Repair strangulated incarcerated right femoral hernia, exploratory laparotomy with small bowel resection on 3/23  CONSULTS:  general surgery  MEDICATIONS: Scheduled Meds: . antiseptic oral rinse  15 mL Mouth Rinse q12n4p  . chlorhexidine  15 mL Mouth Rinse BID  . docusate sodium  100 mg Oral BID  . enoxaparin (LOVENOX) injection  40 mg Subcutaneous Q24H  . insulin aspart  0-9 Units Subcutaneous 4 times per day  . levothyroxine  50 mcg Intravenous QAC breakfast  . pantoprazole (PROTONIX) IV  40 mg Intravenous Q24H  . polyethylene glycol  17 g Oral Daily  . sodium chloride  10-40 mL Intracatheter Q12H  . sodium phosphate  Dextrose 5% IVPB  15 mmol Intravenous Once  . tamsulosin  0.4 mg Oral Daily   Continuous Infusions: . Marland KitchenTPN (CLINIMIX-E) Adult 60 mL/hr at 07/18/13 1712   And  . fat emulsion 250 mL (07/18/13 1712)  . Marland KitchenTPN (CLINIMIX-E) Adult     And  . fat emulsion     PRN Meds:.acetaminophen, acetaminophen, ipratropium-albuterol, meclizine, morphine injection, ondansetron (ZOFRAN) IV, ondansetron, sodium chloride  Antibiotics: Anti-infectives   Start     Dose/Rate Route Frequency Ordered Stop   07/14/13 1000  vancomycin (VANCOCIN) IVPB 750 mg/150 ml premix  Status:  Discontinued     750 mg 150 mL/hr over 60 Minutes Intravenous Every 12 hours 07/14/13 0341 07/18/13 0829   07/14/13 0331  ceFEPIme (MAXIPIME) 1 g in dextrose 5 % 50 mL IVPB  Status:  Discontinued     1 g 100 mL/hr over 30 Minutes Intravenous 3 times per day 07/14/13 0332 07/18/13 0829   07/13/13 2330  piperacillin-tazobactam (ZOSYN) IVPB 3.375  g  Status:  Discontinued     3.375 g 12.5 mL/hr over 240 Minutes Intravenous  Once 07/13/13 2318 07/13/13 2318   07/13/13 2330  [MAR Hold]  piperacillin-tazobactam (ZOSYN) IVPB 3.375 g     (On MAR Hold since 07/14/13 0037)   3.375 g 12.5 mL/hr over 240 Minutes Intravenous  Once 07/13/13 2318 07/14/13 0048    07/13/13 2115  vancomycin (VANCOCIN) IVPB 1000 mg/200 mL premix     1,000 mg 200 mL/hr over 60 Minutes Intravenous  Once 07/13/13 2112 07/13/13 2257       PHYSICAL EXAM: Vital signs in last 24 hours: Filed Vitals:   07/18/13 1407 07/18/13 2224 07/19/13 0233 07/19/13 0500  BP: 110/70 132/72  112/64  Pulse: 90 93  94  Temp: 98.5 F (36.9 C) 98.4 F (36.9 C)  98.5 F (36.9 C)  TempSrc: Oral Oral  Oral  Resp: 16 20  18   Height:      Weight:   70.5 kg (155 lb 6.8 oz)   SpO2: 96% 96%  96%    Weight change: 1.5 kg (3 lb 4.9 oz) Filed Weights   07/17/13 0438 07/18/13 0657 07/19/13 0233  Weight: 68.7 kg (151 lb 7.3 oz) 69 kg (152 lb 1.9 oz) 70.5 kg (155 lb 6.8 oz)   Body mass index is 23.64 kg/(m^2).   Gen Exam: Awake and alert today! Neck: Supple, No JVD.   Chest: Bilateral air entry, coarse by basilar rales CVS: S1 S2 Regular, no murmurs.  Abdomen: soft, BS + appropriately tender, non distended- dry dressing Extremities: no edema, lower extremities warm to touch. Neurologic: Non Focal.   Skin: No Rash.   Wounds: N/A.    Intake/Output from previous day:  Intake/Output Summary (Last 24 hours) at 07/19/13 1104 Last data filed at 07/18/13 2227  Gross per 24 hour  Intake    360 ml  Output    575 ml  Net   -215 ml     LAB RESULTS: CBC  Recent Labs Lab 07/13/13 1940 07/14/13 0508 07/15/13 0425  WBC 15.6* 8.0 10.0  HGB 13.1 11.4* 9.8*  HCT 37.1* 32.8* 28.0*  PLT 215 166 163  MCV 90.0 91.1 91.2  MCH 31.8 31.7 31.9  MCHC 35.3 34.8 35.0  RDW 15.5 15.9* 16.0*  LYMPHSABS 0.9  --  1.6  MONOABS 0.8  --  0.8  EOSABS 0.0  --  0.6  BASOSABS 0.0  --  0.0    Chemistries   Recent Labs Lab 07/15/13 0425 07/16/13 1030 07/17/13 0505 07/18/13 0450 07/19/13 0400  NA 133* 132* 131* 128* 128*  K 3.6* 3.9 4.1 4.2 4.5  CL 100 101 101 99 99  CO2 24 23 23 24 23   GLUCOSE 120* 109* 95 106* 115*  BUN 31* 18 16 16 19   CREATININE 0.76 0.54 0.49* 0.54 0.55  CALCIUM  8.0* 8.0* 8.1* 8.0* 8.2*  MG 2.2 2.1 2.1 2.0 2.1    CBG:  Recent Labs Lab 07/18/13 0600 07/18/13 1236 07/18/13 1753 07/18/13 2356 07/19/13 0756  GLUCAP 106* 110* 109* 101* 114*    GFR Estimated Creatinine Clearance: 61.8 ml/min (by C-G formula based on Cr of 0.55).  Coagulation profile  Recent Labs Lab 07/13/13 2232  INR 1.78*    Cardiac Enzymes No results found for this basename: CK, CKMB, TROPONINI, MYOGLOBIN,  in the last 168 hours  No components found with this basename: POCBNP,  No results found for this basename: DDIMER,  in the last 72  hours No results found for this basename: HGBA1C,  in the last 72 hours No results found for this basename: CHOL, HDL, LDLCALC, TRIG, CHOLHDL, LDLDIRECT,  in the last 72 hours No results found for this basename: TSH, T4TOTAL, FREET3, T3FREE, THYROIDAB,  in the last 72 hours No results found for this basename: VITAMINB12, FOLATE, FERRITIN, TIBC, IRON, RETICCTPCT,  in the last 72 hours No results found for this basename: LIPASE, AMYLASE,  in the last 72 hours  Urine Studies No results found for this basename: UACOL, UAPR, USPG, UPH, UTP, UGL, UKET, UBIL, UHGB, UNIT, UROB, ULEU, UEPI, UWBC, URBC, UBAC, CAST, CRYS, UCOM, BILUA,  in the last 72 hours  MICROBIOLOGY: Recent Results (from the past 240 hour(s))  CULTURE, BLOOD (ROUTINE X 2)     Status: None   Collection Time    07/13/13  8:40 PM      Result Value Ref Range Status   Specimen Description BLOOD RIGHT ARM   Final   Special Requests BOTTLES DRAWN AEROBIC AND ANAEROBIC 10CC EACH   Final   Culture  Setup Time     Final   Value: 07/14/2013 01:55     Performed at [facility]   Culture     Final   Value:        BLOOD CULTURE RECEIVED NO GROWTH TO DATE CULTURE WILL BE HELD FOR 5 DAYS BEFORE ISSUING A FINAL NEGATIVE REPORT     Performed at [facility]   Report Status PENDING   Incomplete  CULTURE, BLOOD (ROUTINE X 2)     Status: None   Collection Time     07/13/13  8:45 PM      Result Value Ref Range Status   Specimen Description BLOOD RIGHT HAND   Final   Special Requests BOTTLES DRAWN AEROBIC ONLY 10CC   Final   Culture  Setup Time     Final   Value: 07/14/2013 01:55     Performed at [facility]   Culture     Final   Value:        BLOOD CULTURE RECEIVED NO GROWTH TO DATE CULTURE WILL BE HELD FOR 5 DAYS BEFORE ISSUING A FINAL NEGATIVE REPORT     Performed at [facility]   Report Status PENDING   Incomplete  URINE CULTURE     Status: None   Collection Time    07/14/13  3:35 AM      Result Value Ref Range Status   Specimen Description URINE, CATHETERIZED   Final   Special Requests NONE   Final   Culture  Setup Time     Final   Value: 07/14/2013 10:14     Performed at [facility]     Final   Value: NO GROWTH     Performed at [facility]   Culture     Final   Value: NO GROWTH     Performed at [facility]   Report Status 07/15/2013 FINAL   Final  MRSA PCR SCREENING     Status: None   Collection Time    07/14/13  3:37 AM      Result Value Ref Range Status   MRSA by PCR NEGATIVE  NEGATIVE Final   Comment:            The GeneXpert MRSA Assay (FDA     approved for NASAL specimens     only), is one component of  a     comprehensive MRSA colonization     surveillance program. It is not     intended to diagnose MRSA     infection nor to guide or     monitor treatment for     MRSA infections.    RADIOLOGY STUDIES/RESULTS: Dg Chest 2 View  07/13/2013   CLINICAL DATA:  Fever, abnormal EKG  EXAM: CHEST  2 VIEW  COMPARISON:  DG CHEST 1 VIEW dated 05/28/2013  FINDINGS: There is right lower lobe airspace disease. There is no other focal parenchymal opacity. No pleural effusion or pneumothorax. The heart and mediastinal contours are unremarkable.  Right shoulder arthroplasty. Osteoarthritis of the left glenohumeral joint.  IMPRESSION: Right lower lobe pneumonia. Recommend followup  radiography in 4-6 weeks, to document complete resolution following adequate medical therapy. If there is not complete resolution, then recommend further evaluation with CT of the chest to exclude underlying pathology.   Electronically Signed   By: Elige Ko   On: 07/13/2013 20:24   Ct Cta Abd/pel W/cm &/or W/o Cm  07/13/2013   CLINICAL DATA:  Nausea and vomiting.  Poor p.o. intake.  EXAM: CT ANGIOGRAPHY ABDOMEN AND PELVIS WITH CONTRAST AND WITHOUT CONTRAST  TECHNIQUE: Multidetector CT imaging of the abdomen and pelvis was performed using the standard protocol during bolus administration of intravenous contrast. Multiplanar reconstructed images and MIPs were obtained and reviewed to evaluate the vascular anatomy.  CONTRAST:  OMNIPAQUE IOHEXOL 350 MG/ML SOLN  COMPARISON:  CT of the abdomen and pelvis 03/09/2012.  FINDINGS: Lung Bases: Multifocal peribronchovascular interstitial and airspace disease throughout the lower lobes of the lungs bilaterally, and the dependent portion of the right middle lobe, favored to reflect sequela of aspiration with aspiration pneumonitis/pneumonia. Mild cardiomegaly. Calcifications of the mitral annulus and aortic valve. Circumferential thickening of the distal esophagus.  Abdomen/Pelvis: The liver has a markedly shrunken appearance and nodular contour, compatible with advanced cirrhosis. No definite focal hepatic lesions are identified on today's examination. Status post cholecystectomy. Pancreas appears atrophic, but there are no definite focal pancreatic lesions. Small calcifications within the spleen likely represent calcified granulomas. The appearance of the adrenal glands is unremarkable bilaterally. Numerous tiny subcentimeter low attenuation lesions in the kidneys bilaterally are too small to definitively characterize. In addition in the right kidney, there are 2 simple cysts, largest of which is in the medial aspect of the lower pole measuring 2.2 cm in diameter.  Prominent portosystemic collateral vessels are noted along the left side of the retroperitoneum, apparently communicating between the splenic circulation and the left iliac circulation. Numerous small gastric and esophageal varices are also noted.  There use a right inguinal hernia which appears to contain a short loop of small bowel. Small bowel proximal to this and the stomach all appear pathologically dilated, compatible with acute bowel obstruction, with bowel loops measuring up to 4.3 cm in diameter. The distal small bowel is decompressed, as is the colon. Numerous colonic diverticulae are noted. Trace volume of ascites. No frank pneumoperitoneum to suggest bowel perforation at this time. Notably, although the loops of bowel immediately adjacent to the hernia demonstrate avid mucosal enhancement, the bowel loop within the hernia demonstrates markedly decreased enhancement, suggesting incarceration and ischemia. There is a small volume of fluid within the right inguinal hernia as well, which is low attenuation.  Musculoskeletal: Status post ORIF in the left femoral neck. There are no aggressive appearing lytic or blastic lesions noted in the visualized portions of the skeleton. T9  vertebral body compression fracture with 60% loss of anterior vertebral body height and 40% loss of posterior vertebral body height appears to be chronic.  Review of the MIP images confirms the above findings.  IMPRESSION: 1. Incarcerated right inguinal hernia containing a short loop of small bowel with evidence of decreased perfusion of the small bowel loop within the hernia, and proximal small bowel obstruction. Surgical consultation is strongly recommended. 2. No pneumoperitoneum at this time. 3. Small volume of ascites may be reactive, or could relate to underlying chronic liver disease. 4. Advanced cirrhosis, as above. 5. Colonic diverticulosis without findings to suggest acute diverticulitis at this time. 6. Multifocal  peribronchovascular interstitial and airspace disease throughout the lung bases bilaterally (right greater than left), likely reflects sequela of recent aspiration. 7. Additional incidental findings, as above. These results were called by telephone at the time of interpretation on 07/13/2013 at 10:07 PM to Dr. Wendall Papa, who verbally acknowledged these results.   Electronically Signed   By: Vinnie Langton M.D.   On: 07/13/2013 22:10    Oren Binet, MD  [facility] Pager:[phone]  If 7PM-7AM, please contact night-coverage [email or web address] Password [email or web address] 07/19/2013, 11:04 AM   LOS: 6 days

## 2013-07-19 NOTE — Progress Notes (Signed)
PARENTERAL NUTRITION CONSULT NOTE - FOLLOW UP  Pharmacy Consult for TPN Indication: SBO  Allergies  Allergen Reactions  . Amitriptyline Other (See Comments)    Dizziness   . Beta Adrenergic Blockers Other (See Comments)    lethargy  . Imdur [Isosorbide]     Headache   . Niacin And Related Rash       . Sulfa Antibiotics Itching, Nausea And Vomiting and Rash    Patient Measurements: Height: 5' 8"  (172.7 cm) Weight: 155 lb 6.8 oz (70.5 kg) IBW/kg (Calculated) : 68.4  Vital Signs: Temp: 98.5 F (36.9 C) (03/28 0500) Temp src: Oral (03/28 0500) BP: 112/64 mmHg (03/28 0500) Pulse Rate: 94 (03/28 0500) Intake/Output from previous day: 03/27 0701 - 03/28 0700 In: 360 [P.O.:360] Out: 675 [Urine:675] Intake/Output from this shift:    Labs: No results found for this basename: WBC, HGB, HCT, PLT, APTT, INR,  in the last 72 hours   Recent Labs  07/17/13 0505 07/18/13 0450 07/19/13 0400  NA 131* 128* 128*  K 4.1 4.2 4.5  CL 101 99 99  CO2 23 24 23   GLUCOSE 95 106* 115*  BUN 16 16 19   CREATININE 0.49* 0.54 0.55  CALCIUM 8.1* 8.0* 8.2*  MG 2.1 2.0 2.1  PHOS 1.9* 2.4 1.9*  PROT 4.6* 4.4*  --   ALBUMIN 1.6* 1.5*  --   AST 29 31  --   ALT 10 10  --   ALKPHOS 160* 139*  --   BILITOT 0.6 0.7  --    Estimated Creatinine Clearance: 61.8 ml/min (by C-G formula based on Cr of 0.55).    Recent Labs  07/18/13 1753 07/18/13 2356 07/19/13 0756  GLUCAP 109* 101* 114*    Insulin Requirements in the past 24 hours:  No SSI required  Current Nutrition:  Clinimix E 5/15 at 60 ml/hr + 20% lipid emulsion at 10 ml/hr - provides 72 g protein and 1502 kCal per day Clear liquids started 3/27 am  Assessment: 69 YOM from SNF admitted with several days of N/V. With right sided hernia for quite awhile, usually soft. Found to have SBO and taken to OR for hernia repair, ex lap and small bowel resection 3/23.  POD #6.   GI: area of necrotic small bowel that would not reduce  along with necrotic hernia sac and omentum. Small BM documented 3/28. On clears.  Endo: hx hypothyroidism- TSH low in Nov 2014; no hx DM, CBGs well-controlled and requiring minimal to no SSI; On IV synthroid.    Lytes: Na 128, low, K 4.5, Phos down to 1.9, Mag 2.1.    Renal: SCr 0.55  Pulm: 2L [place]   Cards: hx AFib- taken off warfarin in Janary 2014 d/t high fall risk; also with hx HTN, HLD and angina; VSS  Hepatobil: noted hx cirrhosis with ascites and esophageal varices; alk phos elevated at 139, albumin low at 1.5. Other LFTs are WNL. Note INR is 1.78 in absence of warfarin.  Triglycerides 80, Prealbumin <3 indicative of poor nutritional status.  Neuro: typically A&O, having some moments of confusion.   ID: presumed HCAP-  vanc/cefepime stopped 3/27.   Best Practices: IV PPI, Lovenox, SCDs   TPN Access: PICC placed 3/23   TPN day#: 6 (started 3/23)  Nutritional Goals:  1900-2100 kCal, 90-100 grams of protein per day  Plan:  Continue Clinimix E 5/15 to 60 mL/hr + 20% lipid emulsion at 74m/hr- this will provide 72 gm protein and 1502 kCal per day  Will not advance TPN at this time as it appears he is tolerating PO intake. Daily IV multivitamin; Trace elements MWF only d/t national shortage  Continue CBGs and sensitive SSI q6h Give Sodium Phos 76mol IV x 1 today Am BMET, mg, phos F/u resolution of ileus, advancement and tolerance of PO diet, eventual taper and dc of TPN.  MLegrand Como Pharm.D., BCPS, AAHIVP Clinical Pharmacist Phone: [phone] [phone]/28/2015, 8:43 AM

## 2013-07-19 NOTE — Progress Notes (Signed)
Day shift RN told night RN the patient's foley came out around 2pm. Patient has not yet voided. RN did bladder scan-450cc residual. Paging MD.

## 2013-07-19 NOTE — Progress Notes (Signed)
Patient had orders to walk during the night. Patient said that he wants to do that later on during the day. Will continue to monitor

## 2013-07-19 NOTE — Progress Notes (Signed)
NP believes that there may have been a false reading on the bladder scanner due to ascites and a swollen scrotom. NP ordered a 500cc bolus to try to initiate urination- RN will be monitoring to see if patient urinates in the next 2 hours. NP, Rogue Bussing will be contacted with any further updates.

## 2013-07-19 NOTE — Progress Notes (Addendum)
Patient ID: Raymond Rufener Sr., male   DOB: August 06, 1922, 80 y.o.   MRN: [id number]   LOS: 6 days  POD#6  Subjective: No new c/o. +flatus, not much pain to speak of, denies N/V.   Objective: Vital signs in last 24 hours: Temp:  [98.4 F (36.9 C)-98.5 F (36.9 C)] 98.5 F (36.9 C) (03/28 0500) Pulse Rate:  [90-94] 94 (03/28 0500) Resp:  [16-20] 18 (03/28 0500) BP: (110-132)/(64-72) 112/64 mmHg (03/28 0500) SpO2:  [96 %] 96 % (03/28 0500) Weight:  [155 lb 6.8 oz (70.5 kg)] 155 lb 6.8 oz (70.5 kg) (03/28 0233) Last BM Date:  (patient is unsure)   Laboratory  BMET  Recent Labs  07/18/13 0450 07/19/13 0400  NA 128* 128*  K 4.2 4.5  CL 99 99  CO2 24 23  GLUCOSE 106* 115*  BUN 16 19  CREATININE 0.54 0.55  CALCIUM 8.0* 8.2*    Physical Exam General appearance: alert and no distress Resp: clear to auscultation bilaterally Cardio: regular rate and rhythm GI: Soft, +BS, incisions C/D/I   Assessment/Plan: POD#6 s/p Ex Lap, SBR, Incarcerated RIH repair with mesh -- Advance diet to fulls Mult medical issues - per hospitalist  PCM - on TNA  Deconditioning - PT/OT recommending SNF  Disp - SNF when medically/surgically stable    Lisette Abu, PA-C Pager: [phone] General Trauma PA Pager: [phone]  07/19/2013  Pt seen and agree with PA  Leighton Ruff. Redmond Pulling, MD, FACS General, Bariatric, & Minimally Invasive Surgery [facility], [place]

## 2013-07-20 DIAGNOSIS — N400 Enlarged prostate without lower urinary tract symptoms: Secondary | ICD-10-CM

## 2013-07-20 LAB — MAGNESIUM: Magnesium: 2 mg/dL (ref 1.5–2.5)

## 2013-07-20 LAB — GLUCOSE, CAPILLARY
GLUCOSE-CAPILLARY: 107 mg/dL — AB (ref 70–99)
GLUCOSE-CAPILLARY: 109 mg/dL — AB (ref 70–99)
GLUCOSE-CAPILLARY: 120 mg/dL — AB (ref 70–99)

## 2013-07-20 LAB — CULTURE, BLOOD (ROUTINE X 2)
Culture: NO GROWTH
Culture: NO GROWTH

## 2013-07-20 LAB — PHOSPHORUS: Phosphorus: 2 mg/dL — ABNORMAL LOW (ref 2.3–4.6)

## 2013-07-20 LAB — BASIC METABOLIC PANEL
BUN: 20 mg/dL (ref 6–23)
CO2: 22 meq/L (ref 19–32)
CREATININE: 0.54 mg/dL (ref 0.50–1.35)
Calcium: 8 mg/dL — ABNORMAL LOW (ref 8.4–10.5)
Chloride: 98 mEq/L (ref 96–112)
GFR calc non Af Amer: >90 mL/min (ref >90)
Glucose, Bld: 117 mg/dL — ABNORMAL HIGH (ref 70–99)
POTASSIUM: 4.5 meq/L (ref 3.7–5.3)
Sodium: 126 mEq/L — ABNORMAL LOW (ref 137–147)

## 2013-07-20 MED ORDER — FAT EMULSION 20 % IV EMUL
250.0000 mL | INTRAVENOUS | Status: DC
  Filled 2013-07-20: qty 250

## 2013-07-20 MED ORDER — FUROSEMIDE 10 MG/ML IJ SOLN
20.0000 mg | Freq: Once | INTRAMUSCULAR | Status: AC
  Administered 2013-07-21: 20 mg via INTRAVENOUS
  Filled 2013-07-20: qty 2

## 2013-07-20 MED ORDER — FUROSEMIDE 10 MG/ML IJ SOLN
INTRAMUSCULAR | Status: AC
  Administered 2013-07-20: 40 mg via INTRAVENOUS
  Filled 2013-07-20: qty 4

## 2013-07-20 MED ORDER — FUROSEMIDE 10 MG/ML IJ SOLN
40.0000 mg | Freq: Once | INTRAMUSCULAR | Status: AC
  Administered 2013-07-20: 40 mg via INTRAVENOUS

## 2013-07-20 MED ORDER — PANTOPRAZOLE SODIUM 40 MG PO TBEC
40.0000 mg | DELAYED_RELEASE_TABLET | Freq: Every day | ORAL | Status: DC
  Administered 2013-07-20 – 2013-07-21 (×2): 40 mg via ORAL
  Filled 2013-07-20 (×2): qty 1

## 2013-07-20 MED ORDER — SPIRONOLACTONE 25 MG PO TABS
25.0000 mg | ORAL_TABLET | ORAL | Status: DC
  Administered 2013-07-20: 25 mg via ORAL
  Filled 2013-07-20: qty 1

## 2013-07-20 MED ORDER — M.V.I. ADULT IV INJ
INTRAVENOUS | Status: DC
  Filled 2013-07-20: qty 2000

## 2013-07-20 MED ORDER — SODIUM PHOSPHATE 3 MMOLE/ML IV SOLN
15.0000 mmol | Freq: Once | INTRAVENOUS | Status: AC
  Administered 2013-07-20: 15 mmol via INTRAVENOUS
  Filled 2013-07-20: qty 5

## 2013-07-20 NOTE — Progress Notes (Signed)
Patient was bladder scanned around midnight and found to have 14cc of residual after a 500cc bolus at 9pm. RN will continue to monitor q2 for any urine.  In and out cath order available  in case use is needed. Patient continues to state that he does not feel the urge to urinate. NP, Rogue Bussing will be contacted if patient does not urinate within a few hours.

## 2013-07-20 NOTE — Progress Notes (Signed)
PATIENT DETAILS Name: Raymond Leanos Sr. Age: 80 y.o. Sex: male Date of Birth: Nov 29, 1922 Admit Date: 07/13/2013 Admitting Physician Stark Klein, MD HQI:[id number] [id number], MD  Subjective: Passed a lot of stool yesterday. Tolerating diet with no major issues  Assessment/Plan: Active Problems: Sepsis - Secondary to presumed aspiration pneumonia and incarcerated inguinal hernia - Resolved with antibiotics, and laparotomy. - Was on vancomycin (since 3/22) and cefepime (since 3/23)- has completed antibiotic course on 3/27 - Blood culture on 07/13/13-negative  Suspected aspiration pneumonitis versus HCAP - Afebrile, and with no leukocytosis - Was on vancomycin (since 3/22) and cefepime (since 3/23)- has completed antibiotic course on 3/27 - Blood culture on 07/13/13-negative -  SLP evaluation done on 3/27-recommendations are for Dys 2 diet with thin liquids when ready  SBO - s/p Ex Lap, Incarcerated RIH repair with mesh  -POD #7 - NG reomved on 3/27,tolerating clears. Current on TNA- will need to stop when tolerating oral diet - General surgery following  History of Atrial fibrillation -Rate controlled. Not anticoagulated due to history of bleeding last year - Currently not on any rate controlling agents, will resume Lopressor when able.  Hypothyroidism - Continue with levothyroxine intravenously, changed to oral route when able.  Hyponatremia -?etiology-suspect SIADH-will start IV Lasix.  BPH -  Foley catheter removed on 3/28-voiding -c/w FLomax  History of alcoholic liver cirrhosis - Resume diuretics when able-starting Lasix today  History of gastroesophageal reflux disease - Continue PPI  Disposition: Remain inpatient- will need to go to SNF on discharge-suspect on 3/30  DVT Prophylaxis: Prophylactic Lovenox  Code Status:  DNR  Family Communication Daughter Ms Staley-3/27 over the phone  Procedures: Repair strangulated incarcerated right femoral hernia,  exploratory laparotomy with small bowel resection on 3/23  CONSULTS:  general surgery  MEDICATIONS: Scheduled Meds: . antiseptic oral rinse  15 mL Mouth Rinse q12n4p  . chlorhexidine  15 mL Mouth Rinse BID  . docusate sodium  100 mg Oral BID  . enoxaparin (LOVENOX) injection  40 mg Subcutaneous Q24H  . levothyroxine  100 mcg Oral QAC breakfast  . pantoprazole  40 mg Oral Daily  . polyethylene glycol  17 g Oral Daily  . sodium chloride  10-40 mL Intracatheter Q12H  . sodium phosphate  Dextrose 5% IVPB  15 mmol Intravenous Once  . tamsulosin  0.4 mg Oral Daily   Continuous Infusions: . Marland KitchenTPN (CLINIMIX-E) Adult 60 mL/hr at 07/19/13 1830   And  . fat emulsion 250 mL (07/19/13 1829)  . Marland KitchenTPN (CLINIMIX-E) Adult     And  . fat emulsion     PRN Meds:.acetaminophen, acetaminophen, ipratropium-albuterol, meclizine, morphine injection, ondansetron (ZOFRAN) IV, ondansetron, sodium chloride  Antibiotics: Anti-infectives   Start     Dose/Rate Route Frequency Ordered Stop   07/14/13 1000  vancomycin (VANCOCIN) IVPB 750 mg/150 ml premix  Status:  Discontinued     750 mg 150 mL/hr over 60 Minutes Intravenous Every 12 hours 07/14/13 0341 07/18/13 0829   07/14/13 0331  ceFEPIme (MAXIPIME) 1 g in dextrose 5 % 50 mL IVPB  Status:  Discontinued     1 g 100 mL/hr over 30 Minutes Intravenous 3 times per day 07/14/13 0332 07/18/13 0829   07/13/13 2330  piperacillin-tazobactam (ZOSYN) IVPB 3.375 g  Status:  Discontinued     3.375 g 12.5 mL/hr over 240 Minutes Intravenous  Once 07/13/13 2318 07/13/13 2318   07/13/13 2330  [MAR Hold]  piperacillin-tazobactam (ZOSYN) IVPB 3.375 g     (  On MAR Hold since 07/14/13 0037)   3.375 g 12.5 mL/hr over 240 Minutes Intravenous  Once 07/13/13 2318 07/14/13 0048   07/13/13 2115  vancomycin (VANCOCIN) IVPB 1000 mg/200 mL premix     1,000 mg 200 mL/hr over 60 Minutes Intravenous  Once 07/13/13 2112 07/13/13 2257       PHYSICAL EXAM: Vital signs in last 24  hours: Filed Vitals:   07/19/13 1404 07/19/13 2116 07/20/13 0500 07/20/13 0529  BP: 124/79 120/71  132/66  Pulse: 96 96  98  Temp: 98.2 F (36.8 C) 98.5 F (36.9 C)  98 F (36.7 C)  TempSrc: Oral Oral  Oral  Resp: 18 18  18   Height:      Weight:   72.6 kg (160 lb 0.9 oz)   SpO2: 98% 96%  96%    Weight change: 2.1 kg (4 lb 10.1 oz) Filed Weights   07/18/13 0657 07/19/13 0233 07/20/13 0500  Weight: 69 kg (152 lb 1.9 oz) 70.5 kg (155 lb 6.8 oz) 72.6 kg (160 lb 0.9 oz)   Body mass index is 24.34 kg/(m^2).   Gen Exam: Awake and alert Neck: Supple, No JVD.   Chest: Bilateral air entry, coarse by basilar rales CVS: S1 S2 Regular, no murmurs.  Abdomen: soft, BS + appropriately tender, non distended- dry dressing Extremities: no edema, lower extremities warm to touch. Neurologic: Non Focal.   Skin: No Rash.   Wounds: N/A.    Intake/Output from previous day:  Intake/Output Summary (Last 24 hours) at 07/20/13 1011 Last data filed at 07/19/13 2128  Gross per 24 hour  Intake      0 ml  Output    110 ml  Net   -110 ml     LAB RESULTS: CBC  Recent Labs Lab 07/13/13 1940 07/14/13 0508 07/15/13 0425  WBC 15.6* 8.0 10.0  HGB 13.1 11.4* 9.8*  HCT 37.1* 32.8* 28.0*  PLT 215 166 163  MCV 90.0 91.1 91.2  MCH 31.8 31.7 31.9  MCHC 35.3 34.8 35.0  RDW 15.5 15.9* 16.0*  LYMPHSABS 0.9  --  1.6  MONOABS 0.8  --  0.8  EOSABS 0.0  --  0.6  BASOSABS 0.0  --  0.0    Chemistries   Recent Labs Lab 07/16/13 1030 07/17/13 0505 07/18/13 0450 07/19/13 0400 07/20/13 0438  NA 132* 131* 128* 128* 126*  K 3.9 4.1 4.2 4.5 4.5  CL 101 101 99 99 98  CO2 23 23 24 23 22   GLUCOSE 109* 95 106* 115* 117*  BUN 18 16 16 19 20   CREATININE 0.54 0.49* 0.54 0.55 0.54  CALCIUM 8.0* 8.1* 8.0* 8.2* 8.0*  MG 2.1 2.1 2.0 2.1 2.0    CBG:  Recent Labs Lab 07/19/13 0606 07/19/13 0756 07/19/13 1600 07/20/13 0003 07/20/13 0628  GLUCAP 107* 114* 106* 120* 107*    GFR Estimated  Creatinine Clearance: 61.8 ml/min (by C-G formula based on Cr of 0.54).  Coagulation profile  Recent Labs Lab 07/13/13 2232  INR 1.78*    Cardiac Enzymes No results found for this basename: CK, CKMB, TROPONINI, MYOGLOBIN,  in the last 168 hours  No components found with this basename: POCBNP,  No results found for this basename: DDIMER,  in the last 72 hours No results found for this basename: HGBA1C,  in the last 72 hours No results found for this basename: CHOL, HDL, LDLCALC, TRIG, CHOLHDL, LDLDIRECT,  in the last 72 hours No results found for this basename: TSH,  T4TOTAL, FREET3, T3FREE, THYROIDAB,  in the last 72 hours No results found for this basename: VITAMINB12, FOLATE, FERRITIN, TIBC, IRON, RETICCTPCT,  in the last 72 hours No results found for this basename: LIPASE, AMYLASE,  in the last 72 hours  Urine Studies No results found for this basename: UACOL, UAPR, USPG, UPH, UTP, UGL, UKET, UBIL, UHGB, UNIT, UROB, ULEU, UEPI, UWBC, URBC, UBAC, CAST, CRYS, UCOM, BILUA,  in the last 72 hours  MICROBIOLOGY: Recent Results (from the past 240 hour(s))  CULTURE, BLOOD (ROUTINE X 2)     Status: None   Collection Time    07/13/13  8:40 PM      Result Value Ref Range Status   Specimen Description BLOOD RIGHT ARM   Final   Special Requests BOTTLES DRAWN AEROBIC AND ANAEROBIC 10CC EACH   Final   Culture  Setup Time     Final   Value: 07/14/2013 01:55     Performed at [facility]   Culture     Final   Value:        BLOOD CULTURE RECEIVED NO GROWTH TO DATE CULTURE WILL BE HELD FOR 5 DAYS BEFORE ISSUING A FINAL NEGATIVE REPORT     Performed at [facility]   Report Status PENDING   Incomplete  CULTURE, BLOOD (ROUTINE X 2)     Status: None   Collection Time    07/13/13  8:45 PM      Result Value Ref Range Status   Specimen Description BLOOD RIGHT HAND   Final   Special Requests BOTTLES DRAWN AEROBIC ONLY 10CC   Final   Culture  Setup Time     Final   Value:  07/14/2013 01:55     Performed at [facility]   Culture     Final   Value:        BLOOD CULTURE RECEIVED NO GROWTH TO DATE CULTURE WILL BE HELD FOR 5 DAYS BEFORE ISSUING A FINAL NEGATIVE REPORT     Performed at [facility]   Report Status PENDING   Incomplete  URINE CULTURE     Status: None   Collection Time    07/14/13  3:35 AM      Result Value Ref Range Status   Specimen Description URINE, CATHETERIZED   Final   Special Requests NONE   Final   Culture  Setup Time     Final   Value: 07/14/2013 10:14     Performed at [facility]     Final   Value: NO GROWTH     Performed at [facility]   Culture     Final   Value: NO GROWTH     Performed at [facility]   Report Status 07/15/2013 FINAL   Final  MRSA PCR SCREENING     Status: None   Collection Time    07/14/13  3:37 AM      Result Value Ref Range Status   MRSA by PCR NEGATIVE  NEGATIVE Final   Comment:            The GeneXpert MRSA Assay (FDA     approved for NASAL specimens     only), is one component of a     comprehensive MRSA colonization     surveillance program. It is not     intended to diagnose MRSA     infection nor to guide or     monitor  treatment for     MRSA infections.    RADIOLOGY STUDIES/RESULTS: Dg Chest 2 View  07/13/2013   CLINICAL DATA:  Fever, abnormal EKG  EXAM: CHEST  2 VIEW  COMPARISON:  DG CHEST 1 VIEW dated 05/28/2013  FINDINGS: There is right lower lobe airspace disease. There is no other focal parenchymal opacity. No pleural effusion or pneumothorax. The heart and mediastinal contours are unremarkable.  Right shoulder arthroplasty. Osteoarthritis of the left glenohumeral joint.  IMPRESSION: Right lower lobe pneumonia. Recommend followup radiography in 4-6 weeks, to document complete resolution following adequate medical therapy. If there is not complete resolution, then recommend further evaluation with CT of the chest to exclude underlying  pathology.   Electronically Signed   By: Kathreen Devoid   On: 07/13/2013 20:24   Ct Cta Abd/pel W/cm &/or W/o Cm  07/13/2013   CLINICAL DATA:  Nausea and vomiting.  Poor p.o. intake.  EXAM: CT ANGIOGRAPHY ABDOMEN AND PELVIS WITH CONTRAST AND WITHOUT CONTRAST  TECHNIQUE: Multidetector CT imaging of the abdomen and pelvis was performed using the standard protocol during bolus administration of intravenous contrast. Multiplanar reconstructed images and MIPs were obtained and reviewed to evaluate the vascular anatomy.  CONTRAST:  151mL OMNIPAQUE IOHEXOL 350 MG/ML SOLN  COMPARISON:  CT of the abdomen and pelvis 03/09/2012.  FINDINGS: Lung Bases: Multifocal peribronchovascular interstitial and airspace disease throughout the lower lobes of the lungs bilaterally, and the dependent portion of the right middle lobe, favored to reflect sequela of aspiration with aspiration pneumonitis/pneumonia. Mild cardiomegaly. Calcifications of the mitral annulus and aortic valve. Circumferential thickening of the distal esophagus.  Abdomen/Pelvis: The liver has a markedly shrunken appearance and nodular contour, compatible with advanced cirrhosis. No definite focal hepatic lesions are identified on today's examination. Status post cholecystectomy. Pancreas appears atrophic, but there are no definite focal pancreatic lesions. Small calcifications within the spleen likely represent calcified granulomas. The appearance of the adrenal glands is unremarkable bilaterally. Numerous tiny subcentimeter low attenuation lesions in the kidneys bilaterally are too small to definitively characterize. In addition in the right kidney, there are 2 simple cysts, largest of which is in the medial aspect of the lower pole measuring 2.2 cm in diameter. Prominent portosystemic collateral vessels are noted along the left side of the retroperitoneum, apparently communicating between the splenic circulation and the left iliac circulation. Numerous small gastric  and esophageal varices are also noted.  There use a right inguinal hernia which appears to contain a short loop of small bowel. Small bowel proximal to this and the stomach all appear pathologically dilated, compatible with acute bowel obstruction, with bowel loops measuring up to 4.3 cm in diameter. The distal small bowel is decompressed, as is the colon. Numerous colonic diverticulae are noted. Trace volume of ascites. No frank pneumoperitoneum to suggest bowel perforation at this time. Notably, although the loops of bowel immediately adjacent to the hernia demonstrate avid mucosal enhancement, the bowel loop within the hernia demonstrates markedly decreased enhancement, suggesting incarceration and ischemia. There is a small volume of fluid within the right inguinal hernia as well, which is low attenuation.  Musculoskeletal: Status post ORIF in the left femoral neck. There are no aggressive appearing lytic or blastic lesions noted in the visualized portions of the skeleton. T9 vertebral body compression fracture with 60% loss of anterior vertebral body height and 40% loss of posterior vertebral body height appears to be chronic.  Review of the MIP images confirms the above findings.  IMPRESSION: 1. Incarcerated right  inguinal hernia containing a short loop of small bowel with evidence of decreased perfusion of the small bowel loop within the hernia, and proximal small bowel obstruction. Surgical consultation is strongly recommended. 2. No pneumoperitoneum at this time. 3. Small volume of ascites may be reactive, or could relate to underlying chronic liver disease. 4. Advanced cirrhosis, as above. 5. Colonic diverticulosis without findings to suggest acute diverticulitis at this time. 6. Multifocal peribronchovascular interstitial and airspace disease throughout the lung bases bilaterally (right greater than left), likely reflects sequela of recent aspiration. 7. Additional incidental findings, as above. These  results were called by telephone at the time of interpretation on 07/13/2013 at 10:07 PM to Dr. Wendall Papa, who verbally acknowledged these results.   Electronically Signed   By: Vinnie Langton M.D.   On: 07/13/2013 22:10    Oren Binet, MD  [facility] Pager:[phone]  If 7PM-7AM, please contact night-coverage [email or web address] Password [email or web address] 07/20/2013, 10:11 AM   LOS: 7 days

## 2013-07-20 NOTE — Progress Notes (Signed)
PARENTERAL NUTRITION CONSULT NOTE - FOLLOW UP  Pharmacy Consult for TPN Indication: SBO  Allergies  Allergen Reactions  . Amitriptyline Other (See Comments)    Dizziness   . Beta Adrenergic Blockers Other (See Comments)    lethargy  . Imdur [Isosorbide]     Headache   . Niacin And Related Rash       . Sulfa Antibiotics Itching, Nausea And Vomiting and Rash    Patient Measurements: Height: 5' 8"  (172.7 cm) Weight: 160 lb 0.9 oz (72.6 kg) IBW/kg (Calculated) : 68.4  Vital Signs: Temp: 98 F (36.7 C) (03/29 0529) Temp src: Oral (03/29 0529) BP: 132/66 mmHg (03/29 0529) Pulse Rate: 98 (03/29 0529) Intake/Output from previous day: 03/28 0701 - 03/29 0700 In: -  Out: 110 [Urine:110] Intake/Output from this shift:    Labs: No results found for this basename: WBC, HGB, HCT, PLT, APTT, INR,  in the last 72 hours   Recent Labs  07/18/13 0450 07/19/13 0400 07/20/13 0438  NA 128* 128* 126*  K 4.2 4.5 4.5  CL 99 99 98  CO2 24 23 22   GLUCOSE 106* 115* 117*  BUN 16 19 20   CREATININE 0.54 0.55 0.54  CALCIUM 8.0* 8.2* 8.0*  MG 2.0 2.1 2.0  PHOS 2.4 1.9* 2.0*  PROT 4.4*  --   --   ALBUMIN 1.5*  --   --   AST 31  --   --   ALT 10  --   --   ALKPHOS 139*  --   --   BILITOT 0.7  --   --    Estimated Creatinine Clearance: 61.8 ml/min (by C-G formula based on Cr of 0.54).    Recent Labs  07/19/13 1600 07/20/13 0003 07/20/13 0628  GLUCAP 106* 120* 107*    Insulin Requirements in the past 24 hours:  No SSI required  Current Nutrition:  Clinimix E 5/15 at 60 ml/hr + 20% lipid emulsion at 10 ml/hr - provides 72 g protein and 1502 kCal per day Full liquids started 3/28  Assessment: 89 YOM from SNF admitted with several days of N/V. With right sided hernia for quite awhile, usually soft. Found to have SBO and taken to OR for hernia repair, ex lap and small bowel resection 3/23.  POD #7.   GI: area of necrotic small bowel that would not reduce along with  necrotic hernia sac and omentum. BM x 2 documented 3/28. On full liquids.  Endo: hx hypothyroidism- TSH low in Nov 2014; no hx DM, CBGs well-controlled and requiring minimal to no SSI - no need to continue to monitor; On IV synthroid.    Lytes: Na 126, low, K 4.5, Phos 2 - stable but remains low, Mag 2, Corrected Ca 10.    Renal: SCr 0.54  Pulm: 2L [place]   Cards: hx AFib- taken off warfarin in Janary 2014 d/t high fall risk; also with hx HTN, HLD and angina; VSS  Hepatobil: noted hx cirrhosis with ascites and esophageal varices; alk phos elevated at 139, albumin low at 1.5. Other LFTs are WNL. Note INR is 1.78 in absence of warfarin.  Triglycerides 80, Prealbumin <3 indicative of poor nutritional status.  Neuro: typically A&O, having some moments of confusion.   ID: presumed HCAP-  vanc/cefepime stopped 3/27.   Best Practices: IV PPI, Lovenox, SCDs   TPN Access: PICC placed 3/23   TPN day#: 7 (started 3/23)  Nutritional Goals:  1900-2100 kCal, 90-100 grams of protein per day  Plan:  Continue Clinimix E 5/15 to 60 mL/hr + 20% lipid emulsion at 63m/hr- this will provide 72 gm protein and 1502 kCal per day Will not advance TPN at this time as it appears he is tolerating PO intake. Daily IV multivitamin; Trace elements MWF only d/t national shortage  Will discontinue CBGs and SSI Give Sodium Phos 182ml IV x 1 today TPN labs in AM Change IV PPI to PO F/u resolution of ileus, advancement and tolerance of PO diet, eventual taper and d/c of TPN.  MiLegrand ComoPharm.D., BCPS, AAHIVP Clinical Pharmacist Phone: [phone]/29/2015, 7:53 AM

## 2013-07-20 NOTE — Progress Notes (Signed)
7 Days Post-Op  Subjective: Doing well. Eating dysphagia diet. C/o O2 cannula  Objective: Vital signs in last 24 hours: Temp:  [98 F (36.7 C)-98.5 F (36.9 C)] 98 F (36.7 C) (03/29 0529) Pulse Rate:  [96-98] 98 (03/29 0529) Resp:  [18] 18 (03/29 0529) BP: (120-132)/(66-79) 132/66 mmHg (03/29 0529) SpO2:  [96 %-98 %] 96 % (03/29 0529) Weight:  [160 lb 0.9 oz (72.6 kg)] 160 lb 0.9 oz (72.6 kg) (03/29 0500) Last BM Date: 07/19/13  Intake/Output from previous day: 03/28 0701 - 03/29 0700 In: -  Out: 110 [Urine:110] Intake/Output this shift:    Alert, nad Soft, mild incisional TTP. Incision c/d/i  Lab Results:  No results found for this basename: WBC, HGB, HCT, PLT,  in the last 72 hours BMET  Recent Labs  07/19/13 0400 07/20/13 0438  NA 128* 126*  K 4.5 4.5  CL 99 98  CO2 23 22  GLUCOSE 115* 117*  BUN 19 20  CREATININE 0.55 0.54  CALCIUM 8.2* 8.0*   PT/INR No results found for this basename: LABPROT, INR,  in the last 72 hours ABG No results found for this basename: PHART, PCO2, PO2, HCO3,  in the last 72 hours  Studies/Results: No results found.  Anti-infectives: Anti-infectives   Start     Dose/Rate Route Frequency Ordered Stop   07/14/13 1000  vancomycin (VANCOCIN) IVPB 750 mg/150 ml premix  Status:  Discontinued     750 mg 150 mL/hr over 60 Minutes Intravenous Every 12 hours 07/14/13 0341 07/18/13 0829   07/14/13 0331  ceFEPIme (MAXIPIME) 1 g in dextrose 5 % 50 mL IVPB  Status:  Discontinued     1 g 100 mL/hr over 30 Minutes Intravenous 3 times per day 07/14/13 0332 07/18/13 0829   07/13/13 2330  piperacillin-tazobactam (ZOSYN) IVPB 3.375 g  Status:  Discontinued     3.375 g 12.5 mL/hr over 240 Minutes Intravenous  Once 07/13/13 2318 07/13/13 2318   07/13/13 2330  [MAR Hold]  piperacillin-tazobactam (ZOSYN) IVPB 3.375 g     (On MAR Hold since 07/14/13 0037)   3.375 g 12.5 mL/hr over 240 Minutes Intravenous  Once 07/13/13 2318 07/14/13 0048   07/13/13 2115  vancomycin (VANCOCIN) IVPB 1000 mg/200 mL premix     1,000 mg 200 mL/hr over 60 Minutes Intravenous  Once 07/13/13 2112 07/13/13 2257      Assessment/Plan: s/p Procedure(s): REPAIR RIGHT INCARCERATED FEMORAL HERNIA WITH MESH (Right) EXPLORATORY LAPAROTOMY (N/A) SMALL BOWEL RESECTION (N/A)  Wean O2 as tolerated No new recs Encourage PO Can wean TPN  Leighton Ruff. Redmond Pulling, MD, FACS General, Bariatric, & Minimally Invasive Surgery [facility], [place]   LOS: 7 days    Gayland Curry 07/20/2013

## 2013-07-20 NOTE — Progress Notes (Signed)
Pt was bladder scanned through the night and found to have 93mL, 41mL, and 58 mL of residual after the 500 mL bolus earlier in the shift. Pt denies the urge to urinate and is without any complaints. NP, Rogue Bussing notified. No new orders given. Will continue to monitor pt.

## 2013-07-21 LAB — BASIC METABOLIC PANEL
BUN: 24 mg/dL — ABNORMAL HIGH (ref 6–23)
CALCIUM: 8.1 mg/dL — AB (ref 8.4–10.5)
CO2: 21 mEq/L (ref 19–32)
Chloride: 99 mEq/L (ref 96–112)
Creatinine, Ser: 0.59 mg/dL (ref 0.50–1.35)
GFR calc Af Amer: >90 mL/min (ref >90)
GFR, EST NON AFRICAN AMERICAN: 88 mL/min — AB (ref >90)
GLUCOSE: 79 mg/dL (ref 70–99)
Potassium: 4.5 mEq/L (ref 3.7–5.3)
SODIUM: 129 meq/L — AB (ref 137–147)

## 2013-07-21 MED ORDER — METOPROLOL TARTRATE 25 MG PO TABS: 12.5000 mg | ORAL_TABLET | Freq: Two times a day (BID) | ORAL | Status: AC

## 2013-07-21 MED ORDER — METOPROLOL TARTRATE 12.5 MG HALF TABLET
12.5000 mg | ORAL_TABLET | Freq: Two times a day (BID) | ORAL | Status: DC
  Filled 2013-07-21 (×2): qty 1

## 2013-07-21 MED ORDER — LORAZEPAM 0.5 MG PO TABS: 0.5000 mg | ORAL_TABLET | Freq: Two times a day (BID) | ORAL | Status: DC | PRN

## 2013-07-21 MED ORDER — OXYCODONE HCL 5 MG PO TABS: 5.0000 mg | ORAL_TABLET | ORAL | Status: DC | PRN

## 2013-07-21 NOTE — Clinical Social Work Note (Signed)
Patient for d/c today to SNF bed at [facility]. Family and patient agreeable to this plan- plan transfer via EMS. Eduard Clos, MSW, [place]

## 2013-07-21 NOTE — Discharge Summary (Signed)
PATIENT DETAILS Name: Raymond Shrewsbury Sr. Age: 80 y.o. Sex: male Date of Birth: Apr 01, 1923 MRN: [id number]. Admit Date: 07/13/2013 Admitting Physician: Stark Klein, MD RJJ:OACZYSA,YTKZSW H, MD  Recommendations for Outpatient Follow-up:  1. Please followup with general surgery for postop wound check 2. Continue to monitor electrolytes closely. Suggest repeat chemistry panel within one week.  PRIMARY DISCHARGE DIAGNOSIS:  Active Problems:   Atrial fibrillation   Hypertension   BPH (benign prostatic hyperplasia)   Hyperlipidemia   CAD (coronary artery disease)   Alcoholic cirrhosis of liver with ascites   Hypothyroidism   Protein-calorie malnutrition, severe   Sepsis   HCAP (healthcare-associated pneumonia)   SBO (small bowel obstruction)   Incarcerated inguinal hernia, unilateral   Dehydration   Nausea and vomiting   Pneumonia   Aspiration pneumonia      PAST MEDICAL HISTORY: Past Medical History  Diagnosis Date  . Arthritis   . Hyperlipidemia   . Hypertension   . A-fib     Dr Wynonia Lawman; WAS on Coumadin until 04/2012  . Angina     uses NTG prn  . Pneumonia     12 /2012  . Recurrent upper respiratory infection (URI)     03/2011  . Hypothyroidism     x 10 yrs  . GERD (gastroesophageal reflux disease)   . Constipation, chronic   . Depression   . History of dizziness   . Anxiety   . Urinary frequency   . Hearing loss of both ears     wears hearing aides  . BPH with obstruction/lower urinary tract symptoms     sees Dr Gaynelle Arabian  . Compression fracture of thoracic vertebra, non-traumatic 06/14/12    T6 and T7 (noted on CT angio chest to r/o PE)  . Alcoholic cirrhosis of liver with ascites 06/14/12    Noted on CT angio chest to r/o PE  . Esophageal varices in alcoholic cirrhosis "   "    "   "    "  . Intertrochanteric fracture of left hip 05/24/2013    DISCHARGE MEDICATIONS:   Medication List    STOP taking these medications       HYDROcodone-acetaminophen  5-325 MG per tablet  Commonly known as:  NORCO/VICODIN     omeprazole 20 MG capsule  Commonly known as:  PRILOSEC     PRESCRIPTION MEDICATION      TAKE these medications       acetaminophen 500 MG tablet  Commonly known as:  TYLENOL  Take 1,000 mg by mouth every 6 (six) hours as needed for headache. Do not exceed more than 4000 mg in a 24 hour period     aspirin EC 81 MG tablet  Take 81 mg by mouth at bedtime.     finasteride 5 MG tablet  Commonly known as:  PROSCAR  Take 5 mg by mouth at bedtime.     furosemide 20 MG tablet  Commonly known as:  LASIX  Take 20 mg by mouth every other day.     guaiFENesin 100 MG/5ML Soln  Commonly known as:  ROBITUSSIN  Take 200 mg by mouth every 6 (six) hours as needed for cough or to loosen phlegm. Do not exceed 4 doses in 24 hours.  If cough has not improved in 24 hours notify physicians     levothyroxine 100 MCG tablet  Commonly known as:  SYNTHROID, LEVOTHROID  Take 100 mcg by mouth daily before breakfast.     LORazepam 0.5 MG  tablet  Commonly known as:  ATIVAN  Take 1 tablet (0.5 mg total) by mouth 2 (two) times daily as needed for anxiety or sleep.     meclizine 25 MG tablet  Commonly known as:  ANTIVERT  Take 25 mg by mouth 2 (two) times daily as needed for dizziness.     metoprolol tartrate 25 MG tablet  Commonly known as:  LOPRESSOR  Take 0.5 tablets (12.5 mg total) by mouth 2 (two) times daily.     mirtazapine 30 MG tablet  Commonly known as:  REMERON  Take 30 mg by mouth at bedtime.     nitroGLYCERIN 0.4 MG SL tablet  Commonly known as:  NITROSTAT  Place 0.4 mg under the tongue every 5 (five) minutes as needed for chest pain. Give 1 tablet every 5 minutes for chest pain x 3 doses.  If unrelieved after 3 doses, call MD     oxyCODONE 5 MG immediate release tablet  Commonly known as:  ROXICODONE  Take 1 tablet (5 mg total) by mouth every 4 (four) hours as needed for severe pain.     pantoprazole 40 MG tablet    Commonly known as:  PROTONIX  Take 40 mg by mouth at bedtime.     polyethylene glycol packet  Commonly known as:  MIRALAX / GLYCOLAX  Take 17 g by mouth daily as needed (constipation). Mix in 8 oz of water and drink     sennosides-docusate sodium 8.6-50 MG tablet  Commonly known as:  SENOKOT-S  Take 2 tablets by mouth daily.     spironolactone 25 MG tablet  Commonly known as:  ALDACTONE  Take 25 mg by mouth every other day.     vitamin B-12 1000 MCG tablet  Commonly known as:  CYANOCOBALAMIN  Take 1,000 mcg by mouth daily.        ALLERGIES:   Allergies  Allergen Reactions  . Amitriptyline Other (See Comments)    Dizziness   . Beta Adrenergic Blockers Other (See Comments)    lethargy  . Imdur [Isosorbide]     Headache   . Niacin And Related Rash       . Sulfa Antibiotics Itching, Nausea And Vomiting and Rash    BRIEF HPI:  See H&P, Labs, Consult and Test reports for all details in brief, patient is a 80 y.o. Caucasian male with history of hypertension, hyperlipidemia, A. fib not on anticoagulation due to bleeding in 2014, history of pneumonia, hypothyroidism, GERD, constipation, depression, history of alcoholic cirrhosis, and history of left hip fracture in January of 2015 status post surgery currently at [facility] rehabilitation who presented on 07/13/13 with cough, nausea along with vomiting. Chest x-ray showed a possible right lower lobe pneumonia, CT scan of the abdomen and pelvis showed an incarcerated right inguinal hernia. Patient was then admitted for further evaluation and treatment.  CONSULTATIONS:   general surgery  PERTINENT RADIOLOGIC STUDIES: Dg Chest 2 View  07/13/2013   CLINICAL DATA:  Fever, abnormal EKG  EXAM: CHEST  2 VIEW  COMPARISON:  DG CHEST 1 VIEW dated 05/28/2013  FINDINGS: There is right lower lobe airspace disease. There is no other focal parenchymal opacity. No pleural effusion or pneumothorax. The heart and mediastinal contours are  unremarkable.  Right shoulder arthroplasty. Osteoarthritis of the left glenohumeral joint.  IMPRESSION: Right lower lobe pneumonia. Recommend followup radiography in 4-6 weeks, to document complete resolution following adequate medical therapy. If there is not complete resolution, then recommend further evaluation with CT  of the chest to exclude underlying pathology.   Electronically Signed   By: Kathreen Devoid   On: 07/13/2013 20:24   Ct Cta Abd/pel W/cm &/or W/o Cm  07/13/2013   CLINICAL DATA:  Nausea and vomiting.  Poor p.o. intake.  EXAM: CT ANGIOGRAPHY ABDOMEN AND PELVIS WITH CONTRAST AND WITHOUT CONTRAST  TECHNIQUE: Multidetector CT imaging of the abdomen and pelvis was performed using the standard protocol during bolus administration of intravenous contrast. Multiplanar reconstructed images and MIPs were obtained and reviewed to evaluate the vascular anatomy.  CONTRAST:  173mL OMNIPAQUE IOHEXOL 350 MG/ML SOLN  COMPARISON:  CT of the abdomen and pelvis 03/09/2012.  FINDINGS: Lung Bases: Multifocal peribronchovascular interstitial and airspace disease throughout the lower lobes of the lungs bilaterally, and the dependent portion of the right middle lobe, favored to reflect sequela of aspiration with aspiration pneumonitis/pneumonia. Mild cardiomegaly. Calcifications of the mitral annulus and aortic valve. Circumferential thickening of the distal esophagus.  Abdomen/Pelvis: The liver has a markedly shrunken appearance and nodular contour, compatible with advanced cirrhosis. No definite focal hepatic lesions are identified on today's examination. Status post cholecystectomy. Pancreas appears atrophic, but there are no definite focal pancreatic lesions. Small calcifications within the spleen likely represent calcified granulomas. The appearance of the adrenal glands is unremarkable bilaterally. Numerous tiny subcentimeter low attenuation lesions in the kidneys bilaterally are too small to definitively  characterize. In addition in the right kidney, there are 2 simple cysts, largest of which is in the medial aspect of the lower pole measuring 2.2 cm in diameter. Prominent portosystemic collateral vessels are noted along the left side of the retroperitoneum, apparently communicating between the splenic circulation and the left iliac circulation. Numerous small gastric and esophageal varices are also noted.  There use a right inguinal hernia which appears to contain a short loop of small bowel. Small bowel proximal to this and the stomach all appear pathologically dilated, compatible with acute bowel obstruction, with bowel loops measuring up to 4.3 cm in diameter. The distal small bowel is decompressed, as is the colon. Numerous colonic diverticulae are noted. Trace volume of ascites. No frank pneumoperitoneum to suggest bowel perforation at this time. Notably, although the loops of bowel immediately adjacent to the hernia demonstrate avid mucosal enhancement, the bowel loop within the hernia demonstrates markedly decreased enhancement, suggesting incarceration and ischemia. There is a small volume of fluid within the right inguinal hernia as well, which is low attenuation.  Musculoskeletal: Status post ORIF in the left femoral neck. There are no aggressive appearing lytic or blastic lesions noted in the visualized portions of the skeleton. T9 vertebral body compression fracture with 60% loss of anterior vertebral body height and 40% loss of posterior vertebral body height appears to be chronic.  Review of the MIP images confirms the above findings.  IMPRESSION: 1. Incarcerated right inguinal hernia containing a short loop of small bowel with evidence of decreased perfusion of the small bowel loop within the hernia, and proximal small bowel obstruction. Surgical consultation is strongly recommended. 2. No pneumoperitoneum at this time. 3. Small volume of ascites may be reactive, or could relate to underlying chronic  liver disease. 4. Advanced cirrhosis, as above. 5. Colonic diverticulosis without findings to suggest acute diverticulitis at this time. 6. Multifocal peribronchovascular interstitial and airspace disease throughout the lung bases bilaterally (right greater than left), likely reflects sequela of recent aspiration. 7. Additional incidental findings, as above. These results were called by telephone at the time of interpretation on 07/13/2013  at 10:07 PM to Dr. Wendall Papa, who verbally acknowledged these results.   Electronically Signed   By: Vinnie Langton M.D.   On: 07/13/2013 22:10     PERTINENT LAB RESULTS: CBC: No results found for this basename: WBC, HGB, HCT, PLT,  in the last 72 hours CMET CMP     Component Value Date/Time   NA 129* 07/21/2013 0618   K 4.5 07/21/2013 0618   CL 99 07/21/2013 0618   CO2 21 07/21/2013 0618   GLUCOSE 79 07/21/2013 0618   BUN 24* 07/21/2013 0618   CREATININE 0.59 07/21/2013 0618   CREATININE 0.85 07/25/2012 1619   CALCIUM 8.1* 07/21/2013 0618   PROT 4.4* 07/18/2013 0450   ALBUMIN 1.5* 07/18/2013 0450   AST 31 07/18/2013 0450   ALT 10 07/18/2013 0450   ALKPHOS 139* 07/18/2013 0450   BILITOT 0.7 07/18/2013 0450   GFRNONAA 88* 07/21/2013 0618   GFRAA >90 07/21/2013 0618    GFR Estimated Creatinine Clearance: 61.8 ml/min (by C-G formula based on Cr of 0.59). No results found for this basename: LIPASE, AMYLASE,  in the last 72 hours No results found for this basename: CKTOTAL, CKMB, CKMBINDEX, TROPONINI,  in the last 72 hours No components found with this basename: POCBNP,  No results found for this basename: DDIMER,  in the last 72 hours No results found for this basename: HGBA1C,  in the last 72 hours No results found for this basename: CHOL, HDL, LDLCALC, TRIG, CHOLHDL, LDLDIRECT,  in the last 72 hours No results found for this basename: TSH, T4TOTAL, FREET3, T3FREE, THYROIDAB,  in the last 72 hours No results found for this basename: VITAMINB12, FOLATE,  FERRITIN, TIBC, IRON, RETICCTPCT,  in the last 72 hours Coags: No results found for this basename: PT, INR,  in the last 72 hours Microbiology: Recent Results (from the past 240 hour(s))  CULTURE, BLOOD (ROUTINE X 2)     Status: None   Collection Time    07/13/13  8:40 PM      Result Value Ref Range Status   Specimen Description BLOOD RIGHT ARM   Final   Special Requests BOTTLES DRAWN AEROBIC AND ANAEROBIC 10CC EACH   Final   Culture  Setup Time     Final   Value: 07/14/2013 01:55     Performed at [facility]   Culture     Final   Value: NO GROWTH 5 DAYS     Performed at [facility]   Report Status 07/20/2013 FINAL   Final  CULTURE, BLOOD (ROUTINE X 2)     Status: None   Collection Time    07/13/13  8:45 PM      Result Value Ref Range Status   Specimen Description BLOOD RIGHT HAND   Final   Special Requests BOTTLES DRAWN AEROBIC ONLY 10CC   Final   Culture  Setup Time     Final   Value: 07/14/2013 01:55     Performed at [place]     Final   Value: NO GROWTH 5 DAYS     Performed at [facility]   Report Status 07/20/2013 FINAL   Final  URINE CULTURE     Status: None   Collection Time    07/14/13  3:35 AM      Result Value Ref Range Status   Specimen Description URINE, CATHETERIZED   Final   Special Requests NONE   Final   Culture  Setup Time     Final   Value: 07/14/2013 10:14     Performed at [place]     Final   Value: NO GROWTH     Performed at [facility]   Culture     Final   Value: NO GROWTH     Performed at [facility]   Report Status 07/15/2013 FINAL   Final  MRSA PCR SCREENING     Status: None   Collection Time    07/14/13  3:37 AM      Result Value Ref Range Status   MRSA by PCR NEGATIVE  NEGATIVE Final   Comment:            The GeneXpert MRSA Assay (FDA     approved for NASAL specimens     only), is one component of a     comprehensive MRSA colonization      surveillance program. It is not     intended to diagnose MRSA     infection nor to guide or     monitor treatment for     MRSA infections.     BRIEF HOSPITAL COURSE:  Sepsis  - Secondary to presumed aspiration pneumonia and incarcerated inguinal hernia  - Resolved with antibiotics, and laparotomy.  - Was on vancomycin (since 3/22) and cefepime (since 3/23)- has completed antibiotic course on 3/27, and remains off antibiotics during the day of discharge. - Blood culture on 07/13/13-negative   Suspected aspiration pneumonitis versus HCAP  - Afebrile, and with no leukocytosis  - Was on vancomycin (since 3/22) and cefepime (since 3/23)- has completed antibiotic course on 3/27  - Blood culture on 07/13/13-negative  - SLP evaluation done on 3/27-recommendations are for Dys 2 diet with thin liquids, spoke with the patient and her daughter, accepting the risk of aspiration and wanting to continue on recommended diet.   SBO - s/p Ex Lap, Incarcerated RIH repair with mesh  - Underwent exploratory laparotomy and repair of a right incarcerated femoral hernia with mesh along with small bowel resection. Required TNA postoperatively, was followed very closely, once bowel function resumed, patient was slowly started on clears, NG tube was removed on chest 3/27, and it has now been advanced to a dysphagia 2 diet. Currently on POD #8 - Please see below for followup instructions with general surgery.   History of Atrial fibrillation  -Rate controlled. Not anticoagulated due to history of bleeding last year  - Currently not on any rate controlling agents, will resume Lopressor on discharge.  Hypothyroidism  - Was managed with levothyroxine intravenously, now back oral levothyroxine.  Hyponatremia  -?etiology-suspect SIADH-started on IV Lasix, sodium increased to 129 by the day of discharge from a lower strength of 125. Will resume Aldactone, and Lasix on discharge.  BPH  - Foley catheter removed on  3/28-voiding  - Resume finasteride on discharge  History of alcoholic liver cirrhosis  - Resume diuretics  History of gastroesophageal reflux disease  - Continue PPI   TODAY-DAY OF DISCHARGE:  Subjective:   Raymond Larsen today has no headache,no chest abdominal pain,no new weakness tingling or numbness, feels much better wants to go home today.   Objective:   Blood pressure 101/60, pulse 93, temperature 97.4 F (36.3 C), temperature source Oral, resp. rate 17, height 5\' 8"  (1.727 m), weight 72.6 kg (160 lb 0.9 oz), SpO2 94.00%.  Intake/Output Summary (Last 24 hours) at 07/21/13 1022 Last data filed at 07/21/13  [id number]  Gross per 24 hour  Intake    120 ml  Output    325 ml  Net   -205 ml   Filed Weights   07/19/13 0233 07/20/13 0500 07/21/13 0341  Weight: 70.5 kg (155 lb 6.8 oz) 72.6 kg (160 lb 0.9 oz) 72.6 kg (160 lb 0.9 oz)    Exam Awake Alert, Oriented *3, No new F.N deficits, Normal affect [place].AT,PERRAL Supple Neck,No JVD, No cervical lymphadenopathy appriciated.  Symmetrical Chest wall movement, Good air movement bilaterally, CTAB RRR,No Gallops,Rubs or new Murmurs, No Parasternal Heave +ve B.Sounds, Abd Soft, Non tender, No organomegaly appriciated, No rebound -guarding or rigidity. No Cyanosis, Clubbing or edema, No new Rash or bruise  DISCHARGE CONDITION: Stable  DISPOSITION: SNF  DISCHARGE INSTRUCTIONS:    Activity:  As tolerated with Full fall precautions use walker/cane & assistance as needed  Diet recommendation: Heart Healthy diet Dysphagia 2 Fluid restriction 1.8 lit/day Aspiration precautions:yes      Discharge Orders   Future Appointments Provider Department Dept Phone   08/04/2013 9:30 AM Stark Klein, MD [facility], [place] [phone]   Future Orders Complete By Expires   Call MD for:  redness, tenderness, or signs of infection (pain, swelling, redness, odor or green/yellow discharge around incision site)  As directed    Call MD for:   temperature >100.4  As directed    Diet - low sodium heart healthy  As directed    Scheduling Instructions:     Dysphagia 2 diet with thin liquids   Increase activity slowly  As directed       Follow-up Information   Follow up with [facility], MD On 08/04/2013. (arrive by 9AM for a 9:15AM appt for a post operative check)    Specialty:  General Surgery   Contact information:   [address] [place] [address] [phone]       Follow up with Tammi Sou, MD. Schedule an appointment as soon as possible for a visit in 1 week. (After discharge from skilled nursing facility)    Specialty:  Family Medicine   Contact information:   [address] [place] [address] [phone]         Total Time spent on discharge equals 45 minutes.  SignedOren Binet 07/21/2013 10:22 AM

## 2013-07-21 NOTE — Progress Notes (Signed)
Per MD order, staples removed. RN and Stage manager counted 29 staples. Steri strips applied. Some serous drainage noted. Pt able to tolerate well. Will continue to monitor.

## 2013-07-21 NOTE — Discharge Instructions (Signed)

## 2013-07-21 NOTE — Progress Notes (Signed)
Report called and given to Elmyra Ricks at [facility].

## 2013-07-21 NOTE — Progress Notes (Signed)
Physical Therapy Treatment Patient Details Name: Marc Leichter Sr. MRN: [id number] DOB: 1922-06-20 Today's Date: 08/10/13    History of Present Illness Adm 3/22 with incarcerated Rt inguinal hernia and underwent surgical repair. 2/1 had Lt femur ORIF s/p fall. Was WBAT.    PT Comments    Pt limited today by fatigue and by several episodes of stool.   Follow Up Recommendations  SNF     Equipment Recommendations  None recommended by PT    Recommendations for Other Services       Precautions / Restrictions Precautions Precautions: Fall    Mobility  Bed Mobility Overal bed mobility: Needs Assistance Bed Mobility: Rolling;Supine to Sit;Sit to Supine Rolling: Mod assist   Supine to sit: Mod assist Sit to supine: +2 for physical assistance;Mod assist   General bed mobility comments: Assist to bring trunk up. Assist to bring legs back up into bed and lower trunk.  Transfers Overall transfer level: Needs assistance Equipment used: Rolling walker (2 wheeled) Transfers: Sit to/from Stand Sit to Stand: +2 physical assistance;Mod assist         General transfer comment: Assist to bring hips up and to achieve full standing.  Ambulation/Gait                 Stairs            Wheelchair Mobility    Modified Rankin (Stroke Patients Only)       Balance Overall balance assessment: Needs assistance Sitting-balance support: Bilateral upper extremity supported Sitting balance-Leahy Scale: Poor   Postural control: Posterior lean Standing balance support: Bilateral upper extremity supported Standing balance-Leahy Scale: Poor Standing balance comment: Stood with walker x 45 sec with knees and hips in flexed position.                    Cognition Arousal/Alertness: Awake/alert Behavior During Therapy: WFL for tasks assessed/performed Overall Cognitive Status: No family/caregiver present to determine baseline cognitive functioning       Memory:  Decreased short-term memory              Exercises      General Comments        Pertinent Vitals/Pain Pt reports incisional soreness.    Home Living                      Prior Function            PT Goals (current goals can now be found in the care plan section) Progress towards PT goals: Not progressing toward goals - comment (Pt with several stools.)    Frequency  Min 2X/week    PT Plan Current plan remains appropriate    End of Session Equipment Utilized During Treatment: Gait belt Activity Tolerance: Patient limited by fatigue Patient left: in bed;with call bell/phone within reach;with bed alarm set     Time: 1205-1230 PT Time Calculation (min): 25 min  Charges:  $Gait Training: 23-37 mins                    G Codes:      Jiro Kiester 10-Aug-2013, 2:42 PM  [facility] PT [phone]

## 2013-07-21 NOTE — Progress Notes (Signed)
NUTRITION FOLLOW-UP  DOCUMENTATION CODES Per approved criteria  -Not Applicable   INTERVENTION: Recommend close monitoring by SNF RD as pt was just weaned from TPN as of 3/29. Pt would benefit from scheduled oral nutrition supplementation, such as Ensure Complete or Resource Breeze, to help with healing.  NUTRITION DIAGNOSIS: Inadequate oral intake related to altered GI function as evidenced by NPO status. Ongoing.  Goal: Pt to meet >/= 90% of their estimated nutrition needs. Unmet.  Monitor:  TPN prescription, weight, labs, I/O's  ASSESSMENT: 80 y.o. male with history of HTN, hyperlipidemia, pneumonia, hypothyroidism, GERD, constipation, depression, alcoholic cirrhosis, and history of left hip fracture in January of 2015; presented with cough, nausea and vomiting; chest x-ray showed right lower lobe pneumonia versus aspiration pneumonitis.   CT of abdomen and pelvis with contrast showed incarcerated right inguinal hernia with evidence of decreased perfusion of small bowel within the hernia and proximal small bowel obstruction.   Patient s/p procedures 3/23: REPAIR RIGHT INCARCERATED FEMORAL HERNIA WITH MESH (Right)  EXPLORATORY LAPAROTOMY  SMALL BOWEL RESECTION   SLP completed BSE on 3/27 with recommendations for Dysphagia 2 diet with thin liquids.  TPN discontinued 3/29 as pt is tolerating diet. Currently eating at least 50% of meals. Pt with discharge orders to SNF at this time. Discussed during progression rounds this AM.  Prealbumin is <3 CBG's: 109, 107, 120 Triglycerides WNL Potassium WNL  Magnesium WNL Phosphorus low 2.0   Height: Ht Readings from Last 1 Encounters:  07/15/13 5\' 8"  (1.727 m)    Weight: Wt Readings from Last 1 Encounters:  07/21/13 160 lb 0.9 oz (72.6 kg)  Admit wt 150 lb  BMI:  Body mass index is 24.34 kg/(m^2). WNL  Estimated Nutritional Needs: Kcal: 1900-2100 Protein: 90-100 gm Fluid: 1.9-2.1 L  Skin: surgical incisions   Diet  Order: Dysphagia 2; thin liquids    Intake/Output Summary (Last 24 hours) at 07/21/13 1040 Last data filed at 07/21/13 0852  Gross per 24 hour  Intake    120 ml  Output    325 ml  Net   -205 ml    Last BM: 3/30  Labs:   Recent Labs Lab 07/18/13 0450 07/19/13 0400 07/20/13 0438 07/21/13 0618  NA 128* 128* 126* 129*  K 4.2 4.5 4.5 4.5  CL 99 99 98 99  CO2 24 23 22 21   BUN 16 19 20  24*  CREATININE 0.54 0.55 0.54 0.59  CALCIUM 8.0* 8.2* 8.0* 8.1*  MG 2.0 2.1 2.0  --   PHOS 2.4 1.9* 2.0*  --   GLUCOSE 106* 115* 117* 79    CBG (last 3)   Recent Labs  07/20/13 0003 07/20/13 0628 07/20/13 1153  GLUCAP 120* 107* 109*   Prealbumin  Date Value Ref Range Status  07/15/2013 <3.0* 17.0 - 34.0 mg/dL Final     (NOTE)     Performed at:  [facility], [place]  [id number]     REF RANGE 17-34 mg/dl     Performed at [facility]   Triglycerides  Date/Time Value Ref Range Status  07/15/2013  4:25 AM 80  <150 mg/dL Final    Scheduled Meds: . antiseptic oral rinse  15 mL Mouth Rinse q12n4p  . chlorhexidine  15 mL Mouth Rinse BID  . docusate sodium  100 mg Oral BID  . enoxaparin (LOVENOX) injection  40 mg Subcutaneous Q24H  . levothyroxine  100 mcg Oral QAC breakfast  . metoprolol tartrate  12.5 mg Oral BID  . pantoprazole  40 mg Oral Daily  . polyethylene glycol  17 g Oral Daily  . sodium chloride  10-40 mL Intracatheter Q12H  . spironolactone  25 mg Oral Q48H  . tamsulosin  0.4 mg Oral Daily    Continuous Infusions:    Inda Coke MS, RD, LDN Inpatient Registered Dietitian Pager: [phone] After-hours pager: [phone]

## 2013-07-21 NOTE — Progress Notes (Signed)
Agree 

## 2013-07-21 NOTE — Progress Notes (Signed)
8 Days Post-Op  Subjective: Pt states doing well, tolerating diet, minimal abdominal pain, + BM, no N/V/D  Objective: Vital signs in last 24 hours: Temp:  [97.4 F (36.3 C)-98.9 F (37.2 C)] 97.4 F (36.3 C) (03/30 0734) Pulse Rate:  [90-113] 93 (03/30 0734) Resp:  [17-18] 17 (03/30 0734) BP: (101-112)/(60-70) 101/60 mmHg (03/30 0734) SpO2:  [95 %-97 %] 95 % (03/30 0734) Weight:  [160 lb 0.9 oz (72.6 kg)] 160 lb 0.9 oz (72.6 kg) (03/30 0341) Last BM Date: 07/20/13  Intake/Output from previous day: 03/29 0701 - 03/30 0700 In: -  Out: 325 [Urine:325] Intake/Output this shift:    Alert, nad Soft, mild incisional TTP. Incision dry intact, minimal serosanguinous drainage    Lab Results:   Recent Labs Lab 07/16/13 1030 07/17/13 0505 07/18/13 0450 07/19/13 0400 07/20/13 0438 07/21/13 0618  NA 132* 131* 128* 128* 126* 129*  K 3.9 4.1 4.2 4.5 4.5 4.5  CL 101 101 99 99 98 99  CO2 23 23 24 23 22 21   GLUCOSE 109* 95 106* 115* 117* 79  BUN 18 16 16 19 20  24*  CREATININE 0.54 0.49* 0.54 0.55 0.54 0.59  CALCIUM 8.0* 8.1* 8.0* 8.2* 8.0* 8.1*  MG 2.1 2.1 2.0 2.1 2.0  --   PHOS 1.8* 1.9* 2.4 1.9* 2.0*  --     Recent Labs Lab 07/15/13 0425  HGB 9.8*  HCT 28.0*  WBC 10.0  PLT 163    Anti-infectives: None   Assessment/Plan: s/p Procedure(s): REPAIR RIGHT INCARCERATED FEMORAL HERNIA WITH MESH (Right) EXPLORATORY LAPAROTOMY (N/A) SMALL BOWEL RESECTION (N/A)  POD # 8  + BM, continue to advance diet as tolerates, d/c TPN  Incision site draining serosanguinous fluid, no evidence of erythema or infection, continue to monitor  Alejah Aristizabal R. Awanda Mink, DO of [facility] 07/21/2013, 8:13 AM

## 2013-07-21 NOTE — Progress Notes (Signed)
Raymond Larsen Sr. to be D/C'd Skilled nursing facility per MD order.  Discussed with the patient and all questions fully answered.    Medication List    STOP taking these medications       HYDROcodone-acetaminophen 5-325 MG per tablet  Commonly known as:  NORCO/VICODIN     omeprazole 20 MG capsule  Commonly known as:  PRILOSEC     PRESCRIPTION MEDICATION      TAKE these medications       acetaminophen 500 MG tablet  Commonly known as:  TYLENOL  Take 1,000 mg by mouth every 6 (six) hours as needed for headache. Do not exceed more than 4000 mg in a 24 hour period     aspirin EC 81 MG tablet  Take 81 mg by mouth at bedtime.     finasteride 5 MG tablet  Commonly known as:  PROSCAR  Take 5 mg by mouth at bedtime.     furosemide 20 MG tablet  Commonly known as:  LASIX  Take 20 mg by mouth every other day.     guaiFENesin 100 MG/5ML Soln  Commonly known as:  ROBITUSSIN  Take 200 mg by mouth every 6 (six) hours as needed for cough or to loosen phlegm. Do not exceed 4 doses in 24 hours.  If cough has not improved in 24 hours notify physicians     levothyroxine 100 MCG tablet  Commonly known as:  SYNTHROID, LEVOTHROID  Take 100 mcg by mouth daily before breakfast.     LORazepam 0.5 MG tablet  Commonly known as:  ATIVAN  Take 1 tablet (0.5 mg total) by mouth 2 (two) times daily as needed for anxiety or sleep.     meclizine 25 MG tablet  Commonly known as:  ANTIVERT  Take 25 mg by mouth 2 (two) times daily as needed for dizziness.     metoprolol tartrate 25 MG tablet  Commonly known as:  LOPRESSOR  Take 0.5 tablets (12.5 mg total) by mouth 2 (two) times daily.     mirtazapine 30 MG tablet  Commonly known as:  REMERON  Take 30 mg by mouth at bedtime.     nitroGLYCERIN 0.4 MG SL tablet  Commonly known as:  NITROSTAT  Place 0.4 mg under the tongue every 5 (five) minutes as needed for chest pain. Give 1 tablet every 5 minutes for chest pain x 3 doses.  If unrelieved after 3  doses, call MD     oxyCODONE 5 MG immediate release tablet  Commonly known as:  ROXICODONE  Take 1 tablet (5 mg total) by mouth every 4 (four) hours as needed for severe pain.     pantoprazole 40 MG tablet  Commonly known as:  PROTONIX  Take 40 mg by mouth at bedtime.     polyethylene glycol packet  Commonly known as:  MIRALAX / GLYCOLAX  Take 17 g by mouth daily as needed (constipation). Mix in 8 oz of water and drink     sennosides-docusate sodium 8.6-50 MG tablet  Commonly known as:  SENOKOT-S  Take 2 tablets by mouth daily.     spironolactone 25 MG tablet  Commonly known as:  ALDACTONE  Take 25 mg by mouth every other day.     vitamin B-12 1000 MCG tablet  Commonly known as:  CYANOCOBALAMIN  Take 1,000 mcg by mouth daily.        VVS, Skin clean, dry and intact without evidence of skin break down, no evidence of skin  tears noted. IV catheter discontinued intact. Site without signs and symptoms of complications. Dressing and pressure applied.  An After Visit Summary was printed and given to the patient.  D/c education completed with patient/family including follow up instructions, medication list, d/c activities limitations if indicated, with other d/c instructions as indicated by MD - patient able to verbalize understanding, all questions fully answered.   Patient instructed to return to ED, call 911, or call MD for any changes in condition.   Patient escorted via stretcher, and D/C SNF via EMS.  Raymond Larsen 07/21/2013 3:46 PM

## 2013-08-04 ENCOUNTER — Ambulatory Visit (INDEPENDENT_AMBULATORY_CARE_PROVIDER_SITE_OTHER): Payer: Medicare PPO | Admitting: General Surgery

## 2013-08-04 ENCOUNTER — Encounter (INDEPENDENT_AMBULATORY_CARE_PROVIDER_SITE_OTHER): Payer: Self-pay | Admitting: General Surgery

## 2013-08-04 VITALS — BP 100/60 | HR 72 | Temp 98.2°F | Resp 16 | Ht 68.0 in

## 2013-08-04 DIAGNOSIS — K4090 Unilateral inguinal hernia, without obstruction or gangrene, not specified as recurrent: Secondary | ICD-10-CM

## 2013-08-04 NOTE — Patient Instructions (Signed)
Wound packing once daily.  Follow up in 4 weeks.  Continue PT and nutritional support.

## 2013-08-04 NOTE — Progress Notes (Signed)
HISTORY: Patient is approximately 3 weeks status post repair of strangulated right femoral hernia that was incarcerated. He also required exploratory laparotomy and small bowel resection.  He has been in a nursing facility for deconditioning. He had broken his hip prior to this and was almost better when he got the strangulated hernia and bowel obstruction.  He is eating okay and is continuing therapy. He is still very weak. He needs significant assistance with mobility. His pain is tolerable. He denies fevers and chills. He is not having any nausea or vomiting.    EXAM: General:  Alert and oriented, very Ho[facility] Incision:  Medial right groin wound with around quarter size area of swelling and cellulitis.  This is opened and had small amt purulent drainage.  Midline wound with small seroma.     PATHOLOGY:  Diagnosis 1. Small intestine, resection - BENIGN SMALL BOWEL TYPE MUCOSA WITH FOCAL ISCHEMIC CHANGE, SEROSAL FIBROSIS, AND SEROSAL NECROSIS. - THE SURGICAL RESECTION MARGINS ARE HISTOLOGICALLY VIABLE. - THERE IS NO EVIDENCE OF MALIGNANCY. 2. Hernia sac - MESOTHELIAL LINED FIBROADIPOSE TISSUE WITH HEMORRHAGE. - THERE IS NO EVIDENCE OF MALIGNANCY.  ASSESSMENT AND PLAN:   Right groin hernia Patient had a very tiny area of infection on the medial aspect of his right lower groin wound which is opened. He has no evidence of bowel obstruction.  He will continue therapy and nutritional support at the [facility].  I will see him back in 4 weeks.      Milus Height, MD Surgical Oncology, [facility]  Tammi Sou, MD [place], Adrian [place], MD

## 2013-08-04 NOTE — Assessment & Plan Note (Signed)
Patient had a very tiny area of infection on the medial aspect of his right lower groin wound which is opened. He has no evidence of bowel obstruction.  He will continue therapy and nutritional support at the [facility].  I will see him back in 4 weeks.

## 2013-08-13 ENCOUNTER — Encounter: Payer: Self-pay | Admitting: Family Medicine

## 2013-08-13 ENCOUNTER — Telehealth: Payer: Self-pay | Admitting: Family Medicine

## 2013-08-13 ENCOUNTER — Ambulatory Visit (INDEPENDENT_AMBULATORY_CARE_PROVIDER_SITE_OTHER): Payer: Medicare HMO | Admitting: Family Medicine

## 2013-08-13 VITALS — BP 102/64 | HR 67 | Temp 97.9°F | Resp 18

## 2013-08-13 DIAGNOSIS — I10 Essential (primary) hypertension: Secondary | ICD-10-CM

## 2013-08-13 DIAGNOSIS — K7031 Alcoholic cirrhosis of liver with ascites: Secondary | ICD-10-CM

## 2013-08-13 DIAGNOSIS — K7030 Alcoholic cirrhosis of liver without ascites: Secondary | ICD-10-CM

## 2013-08-13 DIAGNOSIS — R188 Other ascites: Principal | ICD-10-CM

## 2013-08-13 DIAGNOSIS — K7460 Unspecified cirrhosis of liver: Secondary | ICD-10-CM

## 2013-08-13 DIAGNOSIS — T8130XA Disruption of wound, unspecified, initial encounter: Secondary | ICD-10-CM

## 2013-08-13 LAB — COMPREHENSIVE METABOLIC PANEL
ALBUMIN: 1.9 g/dL — AB (ref 3.5–5.2)
ALT: 19 U/L (ref 0–53)
AST: 40 U/L — AB (ref 0–37)
Alkaline Phosphatase: 186 U/L — ABNORMAL HIGH (ref 39–117)
BUN: 17 mg/dL (ref 6–23)
CO2: 26 mEq/L (ref 19–32)
Calcium: 8.3 mg/dL — ABNORMAL LOW (ref 8.4–10.5)
Chloride: 105 mEq/L (ref 96–112)
Creatinine, Ser: 0.7 mg/dL (ref 0.4–1.5)
GFR: 118.83 mL/min (ref >60.00)
Glucose, Bld: 80 mg/dL (ref 70–99)
POTASSIUM: 3.8 meq/L (ref 3.5–5.1)
Sodium: 135 mEq/L (ref 135–145)
Total Bilirubin: 0.9 mg/dL (ref 0.3–1.2)
Total Protein: 4.9 g/dL — ABNORMAL LOW (ref 6.0–8.3)

## 2013-08-13 LAB — CBC WITH DIFFERENTIAL/PLATELET
BASOS ABS: 0 10*3/uL (ref 0.0–0.1)
BASOS PCT: 0.5 % (ref 0.0–3.0)
EOS ABS: 1.1 10*3/uL — AB (ref 0.0–0.7)
Eosinophils Relative: 12.6 % — ABNORMAL HIGH (ref 0.0–5.0)
HCT: 32.2 % — ABNORMAL LOW (ref 39.0–52.0)
Hemoglobin: 10.7 g/dL — ABNORMAL LOW (ref 13.0–17.0)
LYMPHS PCT: 24.7 % (ref 12.0–46.0)
Lymphs Abs: 2.1 10*3/uL (ref 0.7–4.0)
MCHC: 33.2 g/dL (ref 30.0–36.0)
MCV: 97.5 fl (ref 78.0–100.0)
Monocytes Absolute: 0.7 10*3/uL (ref 0.1–1.0)
Monocytes Relative: 8.3 % (ref 3.0–12.0)
NEUTROS PCT: 53.9 % (ref 43.0–77.0)
Neutro Abs: 4.6 10*3/uL (ref 1.4–7.7)
PLATELETS: 111 10*3/uL — AB (ref 150.0–400.0)
RBC: 3.3 Mil/uL — AB (ref 4.22–5.81)
RDW: 16.2 % — ABNORMAL HIGH (ref 11.5–14.6)
WBC: 8.5 10*3/uL (ref 4.5–10.5)

## 2013-08-13 MED ORDER — SPIRONOLACTONE 25 MG PO TABS: ORAL_TABLET | ORAL | Status: DC

## 2013-08-13 MED ORDER — FUROSEMIDE 20 MG PO TABS: ORAL_TABLET | ORAL | Status: DC

## 2013-08-13 NOTE — Progress Notes (Signed)
OFFICE NOTE  08/13/2013  CC:  Chief Complaint  Patient presents with  . Edema    in abdomen,legs, has hernia surgery in January     HPI: Patient is a 80 y.o. Caucasian male who is here for swelling in abdomen and legs. Recapped recent few months med problems that have him a pretty debilitated state currently: broken left hip with surgical repair 05/2013, then incarcerated right femoral hernia repair with small bowel resection (this one was 1 mo ago).  He went to [facility] after that (for about 1 mo) and then was d/c'd back to [facility] assisted living yesterday.  For about the last month his abd swelling, scrotal swelling, and LE swelling have gradually increased. Has supposed to have been on lasix 20mg  and aldactone 25mg  qd at rehab but daughter not sure he has been getting them regularly.  No fevers.   Pureed, low Na diet in rehab. Says he is urinating fine, "a lot".  Question of some constipation in recent past but all has been ok with this the last 4d or so. Unclear whether or not he has been getting his hydrocodone pain med at rehab recently, although I had ordered it to be given tid scheduled.  ROS: no abd pain, no fever, no vomiting.  +generalized weakness.  No hallucinations/delirium.  No CP, SOB, or palpitations.  No excessive bruising.  Recent outpt gen surg f/u: slight wound dehiscence, wound has been packed daily.  Area minimally tender.  No foul/excessive wound d/c and no bleeding.  Pertinent PMH:  Past Medical History  Diagnosis Date  . Arthritis     Shoulders and knees  . Hyperlipidemia   . Hypertension   . A-fib     Dr Wynonia Lawman; WAS on Coumadin until 04/2012  . Angina     uses NTG prn  . Pneumonia     12 /2012.  HC assoc pneumonia/aspiration pneumonia 2015  . Hypothyroidism     x 10 yrs  . GERD (gastroesophageal reflux disease)   . Constipation, chronic   . Depression   . History of dizziness   . Anxiety   . Urinary frequency   . Hearing loss  of both ears     wears hearing aides  . BPH with obstruction/lower urinary tract symptoms     sees Dr Gaynelle Arabian  . Compression fracture of thoracic vertebra, non-traumatic 06/14/12    T6 and T7 (noted on CT angio chest to r/o PE)  . Alcoholic cirrhosis of liver with ascites 06/14/12    Noted on CT angio chest to r/o PE  . Esophageal varices in alcoholic cirrhosis "   "    "   "    "  . Intertrochanteric fracture of left hip 05/24/2013  . HCAP (healthcare-associated pneumonia) 07/13/2013  . Postoperative anemia due to acute blood loss 05/27/2013   Past Surgical History  Procedure Laterality Date  . Total knee arthroplasty  1994    right  . Total shoulder replacement  1997    right  . Joint replacement      Rt knee and shoulder  . Hernia repair  1990    LIH repair  . Cholecystectomy  2004  . Inguinal hernia repair  05/09/2011    Procedure: HERNIA REPAIR INGUINAL ADULT;  Surgeon: Odis Hollingshead, MD;  Location: [place];  Service: General;  Laterality: Right;  . Femur im nail Left 05/25/2013    Procedure: INTRAMEDULLARY (IM) NAIL FEMORAL;  Surgeon: Lenetta Quaker  Mardelle Matte, MD;  Location: [place];  Service: Orthopedics;  Laterality: Left;  . Femoral hernia repair Right 07/13/2013    Procedure: REPAIR RIGHT INCARCERATED FEMORAL HERNIA WITH MESH;  Surgeon: Stark Klein, MD;  Location: [place];  Service: General;  Laterality: Right;  . Laparotomy N/A 07/13/2013    Procedure: EXPLORATORY LAPAROTOMY;  Surgeon: Stark Klein, MD;  Location: [place];  Service: General;  Laterality: N/A;  . Bowel resection N/A 07/13/2013    Procedure: SMALL BOWEL RESECTION;  Surgeon: Stark Klein, MD;  Location: [place];  Service: General;  Laterality: N/A;    MEDS:  Outpatient Prescriptions Prior to Visit  Medication Sig Dispense Refill  . aspirin EC 81 MG tablet Take 81 mg by mouth at bedtime.      . finasteride (PROSCAR) 5 MG tablet Take 5 mg by mouth at bedtime.       Marland Kitchen guaiFENesin (ROBITUSSIN) 100 MG/5ML SOLN Take 200 mg by  mouth every 6 (six) hours as needed for cough or to loosen phlegm. Do not exceed 4 doses in 24 hours.  If cough has not improved in 24 hours notify physicians      . levothyroxine (SYNTHROID, LEVOTHROID) 100 MCG tablet Take 100 mcg by mouth daily before breakfast.       . LORazepam (ATIVAN) 0.5 MG tablet Take 1 tablet (0.5 mg total) by mouth 2 (two) times daily as needed for anxiety or sleep.  10 tablet  0  . meclizine (ANTIVERT) 25 MG tablet Take 25 mg by mouth 2 (two) times daily as needed for dizziness.      . metoprolol tartrate (LOPRESSOR) 25 MG tablet Take 0.5 tablets (12.5 mg total) by mouth 2 (two) times daily.  60 tablet  5  . mirtazapine (REMERON) 30 MG tablet Take 30 mg by mouth at bedtime.       . nitroGLYCERIN (NITROSTAT) 0.4 MG SL tablet Place 0.4 mg under the tongue every 5 (five) minutes as needed for chest pain. Give 1 tablet every 5 minutes for chest pain x 3 doses.  If unrelieved after 3 doses, call MD      . pantoprazole (PROTONIX) 40 MG tablet Take 40 mg by mouth at bedtime.       . polyethylene glycol (MIRALAX / GLYCOLAX) packet Take 17 g by mouth daily as needed (constipation). Mix in 8 oz of water and drink      . sennosides-docusate sodium (SENOKOT-S) 8.6-50 MG tablet Take 2 tablets by mouth daily.  30 tablet  1  . vitamin B-12 (CYANOCOBALAMIN) 1000 MCG tablet Take 1,000 mcg by mouth daily.      . furosemide (LASIX) 20 MG tablet Take 20 mg by mouth every other day.      . spironolactone (ALDACTONE) 25 MG tablet Take 25 mg by mouth every other day.       Marland Kitchen acetaminophen (TYLENOL) 500 MG tablet Take 1,000 mg by mouth every 6 (six) hours as needed for headache. Do not exceed more than 4000 mg in a 24 hour period       No facility-administered medications prior to visit.    PE: Blood pressure 102/64, pulse 67, temperature 97.9 F (36.6 C), temperature source Oral, resp. rate 18, SpO2 96.00%. Gen: alert, frail-appearing WM sitting in wheelchair in NAD.  Needs maximal  assistance to get out of chair and up onto exam table.  He can sit upright on exam table fine. WPY:KDXI: no injection, icteris, swelling, or exudate.  EOMI, PERRLA. Mouth: lips  without lesion/swelling.  Oral mucosa pink and moist. Oropharynx without erythema, exudate, or swelling.  CV: irreg irreg, rate 70s, no audible murmur LUNGS: trace soft early inspiratory crackles in L>R base but this cleared with several deep breaths.  No wheezing.  Exp phase normal.  Nonlabored resps, good aeration. ABD: moderate distention but not tense.  No tenderness to palpation.  BS normal.  No mass or HSM.  No bruit. +flank dullness to percussion.  +pitting edema of abd wall. EXT: no cyanosis or clubbing.  4+ pitting edema in both LL's, no skin breakdown or rash.  1+ pitting edema in lower thighs. Lower abd/suprapubic region: 2 small (fingertip sized) areas of wound dehiscence, with no discharge, bleeding, or surrounding erythema, no tenderness.  One of these was packed with iodoform gauze and one was not.  IMPRESSION AND PLAN:  Alcoholic cirrhosis of liver with ascites Needs to be diuresed.  Suspect he may not have been getting his diuretics as rx'd at rehab for the last month. No sign of SBP. Check CBC and CMET today. Increase lasix to 40mg  qd and aldactone 50mg  qd--for slow diuresis that hopefully won't affect his bp any since it is already in low normal range lately and I don't want to d/c his metoprolol for fear of RVR from his a-fib. Continue low Na diet.  Abdominal wound dehiscence No sign of infection at this time. I packed one of his wounds with small amount of iodoform gauze today, left the packing alone in the other area.  Continue daily packing changes per [facility] nursing at his ALF. Malnutrition is complicating his healing process.   Spent 30 min with pt today, with >50% of this time spent in counseling and care coordination regarding the above problems.  An After Visit Summary was printed and given  to the patient.  FOLLOW UP: 1 wk

## 2013-08-13 NOTE — Progress Notes (Signed)
Pre visit review using our clinic review tool, if applicable. No additional management support is needed unless otherwise documented below in the visit note. 

## 2013-08-13 NOTE — Telephone Encounter (Signed)
Relevant patient education mailed to patient.  

## 2013-08-13 NOTE — Assessment & Plan Note (Signed)
Needs to be diuresed.  Suspect he may not have been getting his diuretics as rx'd at rehab for the last month. No sign of SBP. Check CBC and CMET today. Increase lasix to 40mg  qd and aldactone 50mg  qd--for slow diuresis that hopefully won't affect his bp any since it is already in low normal range lately and I don't want to d/c his metoprolol for fear of RVR from his a-fib. Continue low Na diet.

## 2013-08-13 NOTE — Assessment & Plan Note (Signed)
No sign of infection at this time. I packed one of his wounds with small amount of iodoform gauze today, left the packing alone in the other area.  Continue daily packing changes per [facility] nursing at his ALF. Malnutrition is complicating his healing process.

## 2013-08-20 ENCOUNTER — Ambulatory Visit (INDEPENDENT_AMBULATORY_CARE_PROVIDER_SITE_OTHER): Payer: Medicare HMO | Admitting: Family Medicine

## 2013-08-20 ENCOUNTER — Encounter: Payer: Self-pay | Admitting: Family Medicine

## 2013-08-20 VITALS — BP 119/71 | HR 70 | Temp 98.4°F | Resp 18 | Ht 68.0 in | Wt 162.0 lb

## 2013-08-20 DIAGNOSIS — R609 Edema, unspecified: Secondary | ICD-10-CM

## 2013-08-20 LAB — BASIC METABOLIC PANEL
BUN: 14 mg/dL (ref 6–23)
CO2: 30 mEq/L (ref 19–32)
CREATININE: 0.9 mg/dL (ref 0.4–1.5)
Calcium: 8.3 mg/dL — ABNORMAL LOW (ref 8.4–10.5)
Chloride: 102 mEq/L (ref 96–112)
GFR: 89.08 mL/min (ref >60.00)
GLUCOSE: 98 mg/dL (ref 70–99)
Potassium: 3.5 mEq/L (ref 3.5–5.1)
Sodium: 136 mEq/L (ref 135–145)

## 2013-08-20 NOTE — Progress Notes (Signed)
OFFICE NOTE  08/20/2013  CC:  Chief Complaint  Patient presents with  . Follow-up     HPI: Patient is a 80 y.o. Caucasian male who is here for 7 day f/u for periph edema/ascites secondary to cirrhosis of liver. Diuretics were increased some but aggressiveness of diuresis had to be tempered due to pt's borderline low bp. Also has some abd surgical wounds with slight dehiscence that are being packed via [facility] nursing.  Feeling improved, less swollen.  No abd pain.  Eating and drinking fair.  Some intermittent nausea but no vomiting. No fevers.  His wounds have not been tended to for the last few days and he says he has a bit of itchy rash in right groin crease last few days.   Pertinent PMH:  Past medical, surgical, social, and family history reviewed and no changes are noted since last office visit.  MEDS:  Outpatient Prescriptions Prior to Visit  Medication Sig Dispense Refill  . acetaminophen (TYLENOL) 500 MG tablet Take 1,000 mg by mouth every 6 (six) hours as needed for headache. Do not exceed more than 4000 mg in a 24 hour period      . aspirin EC 81 MG tablet Take 81 mg by mouth at bedtime.      . finasteride (PROSCAR) 5 MG tablet Take 5 mg by mouth at bedtime.       . furosemide (LASIX) 20 MG tablet 2 tabs po qd  60 tablet  1  . guaiFENesin (ROBITUSSIN) 100 MG/5ML SOLN Take 200 mg by mouth every 6 (six) hours as needed for cough or to loosen phlegm. Do not exceed 4 doses in 24 hours.  If cough has not improved in 24 hours notify physicians      . levothyroxine (SYNTHROID, LEVOTHROID) 100 MCG tablet Take 100 mcg by mouth daily before breakfast.       . LORazepam (ATIVAN) 0.5 MG tablet Take 1 tablet (0.5 mg total) by mouth 2 (two) times daily as needed for anxiety or sleep.  10 tablet  0  . meclizine (ANTIVERT) 25 MG tablet Take 25 mg by mouth 2 (two) times daily as needed for dizziness.      . metoprolol tartrate (LOPRESSOR) 25 MG tablet Take 0.5 tablets (12.5 mg total) by mouth 2  (two) times daily.  60 tablet  5  . mirtazapine (REMERON) 30 MG tablet Take 30 mg by mouth at bedtime.       . nitroGLYCERIN (NITROSTAT) 0.4 MG SL tablet Place 0.4 mg under the tongue every 5 (five) minutes as needed for chest pain. Give 1 tablet every 5 minutes for chest pain x 3 doses.  If unrelieved after 3 doses, call MD      . pantoprazole (PROTONIX) 40 MG tablet Take 40 mg by mouth at bedtime.       . polyethylene glycol (MIRALAX / GLYCOLAX) packet Take 17 g by mouth daily as needed (constipation). Mix in 8 oz of water and drink      . sennosides-docusate sodium (SENOKOT-S) 8.6-50 MG tablet Take 2 tablets by mouth daily.  30 tablet  1  . spironolactone (ALDACTONE) 25 MG tablet 2 tabs po qd  60 tablet  1  . vitamin B-12 (CYANOCOBALAMIN) 1000 MCG tablet Take 1,000 mcg by mouth daily.       No facility-administered medications prior to visit.    PE: Blood pressure 119/71, pulse 70, temperature 98.4 F (36.9 C), temperature source Temporal, resp. rate 18, height 5\' 8"  (  1.727 m), weight 162 lb (73.483 kg), SpO2 95.00%. Gen: alert, NAD.  Very weak, requires max assistance to get out of wheelchair up onto table.  He can sit up on exam table w/out support. Oropharynx: moist, pink mucosa. LUNGS: very soft bibasilar early inspiratory crackles that diminish with several deep breaths but don't totally disappear. Aeration is good, breathing is nonlabored. CV: RRR, soft systolic murmur.  No rub or gallop. ABD: rotund but no tight distention.  +Flank dullness.  No tenderness or palpable HSM.  BS normal.  +pitting edema of abd wall. GU: penis with some edema but less than last exam.  Midline wound with small hole that has some granulation tissue, no drainage--I packed this with small amount of iodoform gauze today.  Right groin wound with similar small hole at periphery with granulation tissue, no discharge or foul odor or surrounding erythema.  I repacked this hole with iodoform today as well.  Wounds  nontender.  Right groin crease with moist skin, some small pinkish macules/papules c/w irritant rash is present here.  LABS: none today  IMPRESSION AND PLAN:  1) Cirrhosis with ascites and peripheral edema; improved some on mild  increased dose of lasix and aldactone. Continue current dosing of these meds, recheck BMET today.  Cont low Na diet. He is hypoalbuminemic, so we'll only be able to improve his edema so much with the current treatments.  2) Irritant rash in right groin crease: start aclovate generic 0.05% cream to area bid prn.  3) Small wound dehiscence x 2: these are filling in slowly but surely.  No sign of wound infection.  Continue packing, ask [facility] nursing to help.  4) Severe protein calorie malnutrition: start ensure bid.  5) Debilitation: s/p 2 major surgeries over the last 5 mo, now malnourished.  Hopefully he can start PT soon and recover a bit of functioning.  An After Visit Summary was printed and given to the patient.  FOLLOW UP: 10d

## 2013-08-29 ENCOUNTER — Telehealth (INDEPENDENT_AMBULATORY_CARE_PROVIDER_SITE_OTHER): Payer: Self-pay | Admitting: General Surgery

## 2013-08-29 NOTE — Telephone Encounter (Signed)
Pt's daughter called to ask if Dr. Zella Richer will see her father (HIS request) for postop hernia repair, done emergently by Dr. Barry Dienes.  He is doing well.  She states he has been treated previously by Dr. Zella Richer and prefers to follow up with him instead.  Will ask Dr. Zella Richer for his advice on this.

## 2013-08-29 NOTE — Telephone Encounter (Signed)
Needs to be approved by Dr. Barry Dienes.

## 2013-09-01 ENCOUNTER — Encounter (INDEPENDENT_AMBULATORY_CARE_PROVIDER_SITE_OTHER): Payer: Medicare PPO | Admitting: General Surgery

## 2013-09-01 NOTE — Telephone Encounter (Signed)
After checking with Dr. Barry Dienes, she is okay with Dr. Zella Richer seeing this pt in follow up.

## 2013-09-03 ENCOUNTER — Encounter: Payer: Self-pay | Admitting: Family Medicine

## 2013-09-03 ENCOUNTER — Emergency Department (HOSPITAL_COMMUNITY): Payer: Medicare HMO

## 2013-09-03 ENCOUNTER — Ambulatory Visit (INDEPENDENT_AMBULATORY_CARE_PROVIDER_SITE_OTHER): Payer: Medicare HMO | Admitting: Family Medicine

## 2013-09-03 ENCOUNTER — Encounter (HOSPITAL_COMMUNITY): Payer: Self-pay | Admitting: Emergency Medicine

## 2013-09-03 ENCOUNTER — Inpatient Hospital Stay (HOSPITAL_COMMUNITY)
Admission: EM | Admit: 2013-09-03 | Discharge: 2013-09-11 | DRG: 441 | Disposition: A | Discharge status: Home Health | Payer: Medicare HMO | Attending: Internal Medicine | Admitting: Internal Medicine

## 2013-09-03 VITALS — BP 119/62 | HR 82 | Temp 98.2°F | Resp 18

## 2013-09-03 DIAGNOSIS — E039 Hypothyroidism, unspecified: Secondary | ICD-10-CM

## 2013-09-03 DIAGNOSIS — N138 Other obstructive and reflux uropathy: Secondary | ICD-10-CM | POA: Diagnosis present

## 2013-09-03 DIAGNOSIS — IMO0002 Reserved for concepts with insufficient information to code with codable children: Secondary | ICD-10-CM | POA: Diagnosis not present

## 2013-09-03 DIAGNOSIS — R740 Nonspecific elevation of levels of transaminase and lactic acid dehydrogenase [LDH]: Secondary | ICD-10-CM

## 2013-09-03 DIAGNOSIS — E43 Unspecified severe protein-calorie malnutrition: Secondary | ICD-10-CM

## 2013-09-03 DIAGNOSIS — M1712 Unilateral primary osteoarthritis, left knee: Secondary | ICD-10-CM

## 2013-09-03 DIAGNOSIS — K7460 Unspecified cirrhosis of liver: Secondary | ICD-10-CM

## 2013-09-03 DIAGNOSIS — R7402 Elevation of levels of lactic acid dehydrogenase (LDH): Secondary | ICD-10-CM

## 2013-09-03 DIAGNOSIS — L03319 Cellulitis of trunk, unspecified: Secondary | ICD-10-CM

## 2013-09-03 DIAGNOSIS — I4891 Unspecified atrial fibrillation: Secondary | ICD-10-CM

## 2013-09-03 DIAGNOSIS — K7031 Alcoholic cirrhosis of liver with ascites: Secondary | ICD-10-CM

## 2013-09-03 DIAGNOSIS — Z66 Do not resuscitate: Secondary | ICD-10-CM | POA: Diagnosis present

## 2013-09-03 DIAGNOSIS — M19019 Primary osteoarthritis, unspecified shoulder: Secondary | ICD-10-CM

## 2013-09-03 DIAGNOSIS — E785 Hyperlipidemia, unspecified: Secondary | ICD-10-CM

## 2013-09-03 DIAGNOSIS — G9340 Encephalopathy, unspecified: Secondary | ICD-10-CM

## 2013-09-03 DIAGNOSIS — R5381 Other malaise: Secondary | ICD-10-CM | POA: Diagnosis present

## 2013-09-03 DIAGNOSIS — N390 Urinary tract infection, site not specified: Secondary | ICD-10-CM | POA: Diagnosis present

## 2013-09-03 DIAGNOSIS — I8510 Secondary esophageal varices without bleeding: Secondary | ICD-10-CM | POA: Diagnosis present

## 2013-09-03 DIAGNOSIS — F411 Generalized anxiety disorder: Secondary | ICD-10-CM | POA: Diagnosis present

## 2013-09-03 DIAGNOSIS — K7030 Alcoholic cirrhosis of liver without ascites: Secondary | ICD-10-CM | POA: Diagnosis present

## 2013-09-03 DIAGNOSIS — R4182 Altered mental status, unspecified: Secondary | ICD-10-CM

## 2013-09-03 DIAGNOSIS — Y838 Other surgical procedures as the cause of abnormal reaction of the patient, or of later complication, without mention of misadventure at the time of the procedure: Secondary | ICD-10-CM | POA: Diagnosis present

## 2013-09-03 DIAGNOSIS — Z7982 Long term (current) use of aspirin: Secondary | ICD-10-CM

## 2013-09-03 DIAGNOSIS — N401 Enlarged prostate with lower urinary tract symptoms: Secondary | ICD-10-CM | POA: Diagnosis present

## 2013-09-03 DIAGNOSIS — F1020 Alcohol dependence, uncomplicated: Secondary | ICD-10-CM | POA: Diagnosis present

## 2013-09-03 DIAGNOSIS — T8140XA Infection following a procedure, unspecified, initial encounter: Secondary | ICD-10-CM | POA: Diagnosis present

## 2013-09-03 DIAGNOSIS — K7682 Hepatic encephalopathy: Principal | ICD-10-CM | POA: Diagnosis present

## 2013-09-03 DIAGNOSIS — N400 Enlarged prostate without lower urinary tract symptoms: Secondary | ICD-10-CM

## 2013-09-03 DIAGNOSIS — R188 Other ascites: Secondary | ICD-10-CM

## 2013-09-03 DIAGNOSIS — Z87891 Personal history of nicotine dependence: Secondary | ICD-10-CM

## 2013-09-03 DIAGNOSIS — T81321A Disruption or dehiscence of closure of internal operation (surgical) wound of abdominal wall muscle or fascia, initial encounter: Secondary | ICD-10-CM

## 2013-09-03 DIAGNOSIS — I2510 Atherosclerotic heart disease of native coronary artery without angina pectoris: Secondary | ICD-10-CM

## 2013-09-03 DIAGNOSIS — Z6379 Other stressful life events affecting family and household: Secondary | ICD-10-CM

## 2013-09-03 DIAGNOSIS — H9190 Unspecified hearing loss, unspecified ear: Secondary | ICD-10-CM | POA: Diagnosis present

## 2013-09-03 DIAGNOSIS — N139 Obstructive and reflux uropathy, unspecified: Secondary | ICD-10-CM | POA: Diagnosis present

## 2013-09-03 DIAGNOSIS — R5383 Other fatigue: Principal | ICD-10-CM

## 2013-09-03 DIAGNOSIS — K219 Gastro-esophageal reflux disease without esophagitis: Secondary | ICD-10-CM | POA: Diagnosis present

## 2013-09-03 DIAGNOSIS — Z9089 Acquired absence of other organs: Secondary | ICD-10-CM

## 2013-09-03 DIAGNOSIS — F0392 Unspecified dementia, unspecified severity, with psychotic disturbance: Secondary | ICD-10-CM | POA: Diagnosis present

## 2013-09-03 DIAGNOSIS — Z882 Allergy status to sulfonamides status: Secondary | ICD-10-CM

## 2013-09-03 DIAGNOSIS — R609 Edema, unspecified: Secondary | ICD-10-CM

## 2013-09-03 DIAGNOSIS — G8929 Other chronic pain: Secondary | ICD-10-CM

## 2013-09-03 DIAGNOSIS — B9689 Other specified bacterial agents as the cause of diseases classified elsewhere: Secondary | ICD-10-CM | POA: Diagnosis present

## 2013-09-03 DIAGNOSIS — Z888 Allergy status to other drugs, medicaments and biological substances status: Secondary | ICD-10-CM

## 2013-09-03 DIAGNOSIS — F05 Delirium due to known physiological condition: Secondary | ICD-10-CM

## 2013-09-03 DIAGNOSIS — K7290 Hepatic failure, unspecified without coma: Principal | ICD-10-CM | POA: Diagnosis present

## 2013-09-03 DIAGNOSIS — R600 Localized edema: Secondary | ICD-10-CM

## 2013-09-03 DIAGNOSIS — F329 Major depressive disorder, single episode, unspecified: Secondary | ICD-10-CM | POA: Diagnosis present

## 2013-09-03 DIAGNOSIS — L02219 Cutaneous abscess of trunk, unspecified: Secondary | ICD-10-CM | POA: Diagnosis present

## 2013-09-03 DIAGNOSIS — E7220 Disorder of urea cycle metabolism, unspecified: Secondary | ICD-10-CM

## 2013-09-03 DIAGNOSIS — M1710 Unilateral primary osteoarthritis, unspecified knee: Secondary | ICD-10-CM | POA: Diagnosis present

## 2013-09-03 DIAGNOSIS — Z96619 Presence of unspecified artificial shoulder joint: Secondary | ICD-10-CM

## 2013-09-03 DIAGNOSIS — D649 Anemia, unspecified: Secondary | ICD-10-CM

## 2013-09-03 DIAGNOSIS — I209 Angina pectoris, unspecified: Secondary | ICD-10-CM | POA: Diagnosis present

## 2013-09-03 DIAGNOSIS — Z79899 Other long term (current) drug therapy: Secondary | ICD-10-CM

## 2013-09-03 DIAGNOSIS — K5900 Constipation, unspecified: Secondary | ICD-10-CM | POA: Diagnosis present

## 2013-09-03 DIAGNOSIS — T8130XA Disruption of wound, unspecified, initial encounter: Secondary | ICD-10-CM

## 2013-09-03 DIAGNOSIS — I10 Essential (primary) hypertension: Secondary | ICD-10-CM

## 2013-09-03 DIAGNOSIS — R1013 Epigastric pain: Secondary | ICD-10-CM

## 2013-09-03 DIAGNOSIS — M7918 Myalgia, other site: Secondary | ICD-10-CM

## 2013-09-03 DIAGNOSIS — F3289 Other specified depressive episodes: Secondary | ICD-10-CM | POA: Diagnosis present

## 2013-09-03 DIAGNOSIS — R21 Rash and other nonspecific skin eruption: Secondary | ICD-10-CM

## 2013-09-03 DIAGNOSIS — F0390 Unspecified dementia without behavioral disturbance: Secondary | ICD-10-CM | POA: Diagnosis present

## 2013-09-03 DIAGNOSIS — Z96659 Presence of unspecified artificial knee joint: Secondary | ICD-10-CM

## 2013-09-03 LAB — URINE MICROSCOPIC-ADD ON

## 2013-09-03 LAB — AMMONIA: Ammonia: 86 umol/L — ABNORMAL HIGH (ref 11–60)

## 2013-09-03 LAB — I-STAT TROPONIN, ED: TROPONIN I, POC: 0.01 ng/mL (ref 0.00–0.08)

## 2013-09-03 LAB — CREATININE, SERUM
Creatinine, Ser: 0.81 mg/dL (ref 0.50–1.35)
GFR calc non Af Amer: 77 mL/min — ABNORMAL LOW (ref >90)
GFR, EST AFRICAN AMERICAN: 89 mL/min — AB (ref >90)

## 2013-09-03 LAB — I-STAT CHEM 8, ED
BUN: 12 mg/dL (ref 6–23)
CREATININE: 1 mg/dL (ref 0.50–1.35)
Calcium, Ion: 1.2 mmol/L (ref 1.13–1.30)
Chloride: 103 mEq/L (ref 96–112)
GLUCOSE: 82 mg/dL (ref 70–99)
HCT: 34 % — ABNORMAL LOW (ref 39.0–52.0)
Hemoglobin: 11.6 g/dL — ABNORMAL LOW (ref 13.0–17.0)
POTASSIUM: 3.9 meq/L (ref 3.7–5.3)
Sodium: 141 mEq/L (ref 137–147)
TCO2: 26 mmol/L (ref 0–100)

## 2013-09-03 LAB — CBC
HEMATOCRIT: 32.3 % — AB (ref 39.0–52.0)
HEMATOCRIT: 28.6 % — AB (ref 39.0–52.0)
HEMOGLOBIN: 10.8 g/dL — AB (ref 13.0–17.0)
Hemoglobin: 9.9 g/dL — ABNORMAL LOW (ref 13.0–17.0)
MCH: 31.2 pg (ref 26.0–34.0)
MCH: 32.1 pg (ref 26.0–34.0)
MCHC: 33.4 g/dL (ref 30.0–36.0)
MCHC: 34.6 g/dL (ref 30.0–36.0)
MCV: 93.4 fL (ref 78.0–100.0)
MCV: 92.9 fL (ref 78.0–100.0)
Platelets: 167 10*3/uL (ref 150–400)
Platelets: 146 10*3/uL — ABNORMAL LOW (ref 150–400)
RBC: 3.46 MIL/uL — AB (ref 4.22–5.81)
RBC: 3.08 MIL/uL — ABNORMAL LOW (ref 4.22–5.81)
RDW: 14.8 % (ref 11.5–15.5)
RDW: 14.9 % (ref 11.5–15.5)
WBC: 7.6 10*3/uL (ref 4.0–10.5)
WBC: 7.1 10*3/uL (ref 4.0–10.5)

## 2013-09-03 LAB — URINALYSIS, ROUTINE W REFLEX MICROSCOPIC
Glucose, UA: NEGATIVE mg/dL
HGB URINE DIPSTICK: NEGATIVE
Ketones, ur: NEGATIVE mg/dL
Nitrite: NEGATIVE
Protein, ur: NEGATIVE mg/dL
SPECIFIC GRAVITY, URINE: 1.019 (ref 1.005–1.030)
Urobilinogen, UA: 1 mg/dL (ref 0.0–1.0)
pH: 7.5 (ref 5.0–8.0)

## 2013-09-03 LAB — COMPREHENSIVE METABOLIC PANEL
ALBUMIN: 2.1 g/dL — AB (ref 3.5–5.2)
ALT: 14 U/L (ref 0–53)
AST: 36 U/L (ref 0–37)
Alkaline Phosphatase: 169 U/L — ABNORMAL HIGH (ref 39–117)
BILIRUBIN TOTAL: 1.4 mg/dL — AB (ref 0.3–1.2)
BUN: 14 mg/dL (ref 6–23)
CHLORIDE: 107 meq/L (ref 96–112)
CO2: 26 mEq/L (ref 19–32)
Calcium: 8.6 mg/dL (ref 8.4–10.5)
Creatinine, Ser: 0.91 mg/dL (ref 0.50–1.35)
GFR calc Af Amer: 85 mL/min — ABNORMAL LOW (ref >90)
GFR calc non Af Amer: 73 mL/min — ABNORMAL LOW (ref >90)
Glucose, Bld: 81 mg/dL (ref 70–99)
POTASSIUM: 4.1 meq/L (ref 3.7–5.3)
SODIUM: 143 meq/L (ref 137–147)
Total Protein: 5.7 g/dL — ABNORMAL LOW (ref 6.0–8.3)

## 2013-09-03 LAB — I-STAT CG4 LACTIC ACID, ED: Lactic Acid, Venous: 2.25 mmol/L — ABNORMAL HIGH (ref 0.5–2.2)

## 2013-09-03 LAB — PROTIME-INR
INR: 1.72 — AB (ref 0.00–1.49)
Prothrombin Time: 19.7 seconds — ABNORMAL HIGH (ref 11.6–15.2)

## 2013-09-03 MED ORDER — SODIUM CHLORIDE 0.9 % IJ SOLN
3.0000 mL | Freq: Two times a day (BID) | INTRAMUSCULAR | Status: DC
  Administered 2013-09-04 – 2013-09-11 (×4): 3 mL via INTRAVENOUS

## 2013-09-03 MED ORDER — POLYETHYLENE GLYCOL 3350 17 G PO PACK
17.0000 g | PACK | Freq: Every day | ORAL | Status: DC | PRN
  Filled 2013-09-03: qty 1

## 2013-09-03 MED ORDER — MIRTAZAPINE 30 MG PO TABS
30.0000 mg | ORAL_TABLET | Freq: Every day | ORAL | Status: DC
  Administered 2013-09-04: 23:00:00 via ORAL
  Administered 2013-09-05 – 2013-09-10 (×6): 30 mg via ORAL
  Filled 2013-09-03 (×11): qty 1

## 2013-09-03 MED ORDER — VANCOMYCIN HCL 10 G IV SOLR
1250.0000 mg | INTRAVENOUS | Status: DC
  Administered 2013-09-03 – 2013-09-04 (×2): 1250 mg via INTRAVENOUS
  Filled 2013-09-03 (×3): qty 1250

## 2013-09-03 MED ORDER — SENNOSIDES-DOCUSATE SODIUM 8.6-50 MG PO TABS
2.0000 | ORAL_TABLET | Freq: Every day | ORAL | Status: DC
  Administered 2013-09-08 – 2013-09-10 (×2): 2 via ORAL
  Filled 2013-09-03 (×7): qty 2

## 2013-09-03 MED ORDER — DEXTROSE 5 % IV SOLN
2.0000 g | Freq: Three times a day (TID) | INTRAVENOUS | Status: DC
  Administered 2013-09-03 – 2013-09-09 (×17): 2 g via INTRAVENOUS
  Filled 2013-09-03 (×19): qty 2

## 2013-09-03 MED ORDER — LEVOFLOXACIN IN D5W 500 MG/100ML IV SOLN: 500.0000 mg | INTRAVENOUS | Status: DC

## 2013-09-03 MED ORDER — VITAMIN B-12 1000 MCG PO TABS
1000.0000 ug | ORAL_TABLET | Freq: Every day | ORAL | Status: DC
  Administered 2013-09-04 – 2013-09-11 (×8): 1000 ug via ORAL
  Filled 2013-09-03 (×8): qty 1

## 2013-09-03 MED ORDER — LORAZEPAM 0.5 MG PO TABS
0.5000 mg | ORAL_TABLET | Freq: Two times a day (BID) | ORAL | Status: DC | PRN
  Administered 2013-09-04 – 2013-09-11 (×5): 0.5 mg via ORAL
  Filled 2013-09-03 (×5): qty 1

## 2013-09-03 MED ORDER — NITROGLYCERIN 0.4 MG SL SUBL: 0.4000 mg | SUBLINGUAL_TABLET | SUBLINGUAL | Status: DC | PRN

## 2013-09-03 MED ORDER — PANTOPRAZOLE SODIUM 40 MG PO TBEC
40.0000 mg | DELAYED_RELEASE_TABLET | Freq: Every day | ORAL | Status: DC
  Administered 2013-09-04 – 2013-09-10 (×8): 40 mg via ORAL
  Filled 2013-09-03 (×8): qty 1

## 2013-09-03 MED ORDER — FUROSEMIDE 40 MG PO TABS
40.0000 mg | ORAL_TABLET | Freq: Every day | ORAL | Status: DC
  Administered 2013-09-04 – 2013-09-11 (×8): 40 mg via ORAL
  Filled 2013-09-03 (×8): qty 1

## 2013-09-03 MED ORDER — LACTULOSE 10 GM/15ML PO SOLN
30.0000 g | Freq: Three times a day (TID) | ORAL | Status: DC
  Administered 2013-09-04 – 2013-09-10 (×10): 30 g via ORAL
  Filled 2013-09-03 (×26): qty 45

## 2013-09-03 MED ORDER — FINASTERIDE 5 MG PO TABS
5.0000 mg | ORAL_TABLET | Freq: Every day | ORAL | Status: DC
  Administered 2013-09-04 – 2013-09-10 (×8): 5 mg via ORAL
  Filled 2013-09-03 (×9): qty 1

## 2013-09-03 MED ORDER — GUAIFENESIN 100 MG/5ML PO SOLN
200.0000 mg | Freq: Four times a day (QID) | ORAL | Status: DC | PRN
  Filled 2013-09-03: qty 10

## 2013-09-03 MED ORDER — LEVOTHYROXINE SODIUM 100 MCG PO TABS
100.0000 ug | ORAL_TABLET | Freq: Every day | ORAL | Status: DC
  Administered 2013-09-04 – 2013-09-11 (×8): 100 ug via ORAL
  Filled 2013-09-03 (×10): qty 1

## 2013-09-03 MED ORDER — METOPROLOL TARTRATE 12.5 MG HALF TABLET
12.5000 mg | ORAL_TABLET | Freq: Two times a day (BID) | ORAL | Status: DC
  Administered 2013-09-04 – 2013-09-11 (×13): 12.5 mg via ORAL
  Filled 2013-09-03 (×17): qty 1

## 2013-09-03 MED ORDER — SODIUM CHLORIDE 0.9 % IV BOLUS (SEPSIS)
500.0000 mL | Freq: Once | INTRAVENOUS | Status: AC
  Administered 2013-09-03: 500 mL via INTRAVENOUS

## 2013-09-03 MED ORDER — SPIRONOLACTONE 50 MG PO TABS
50.0000 mg | ORAL_TABLET | Freq: Every day | ORAL | Status: DC
  Administered 2013-09-04 – 2013-09-11 (×8): 50 mg via ORAL
  Filled 2013-09-03 (×8): qty 1

## 2013-09-03 MED ORDER — IOHEXOL 300 MG/ML  SOLN
100.0000 mL | Freq: Once | INTRAMUSCULAR | Status: AC | PRN
  Administered 2013-09-03: 100 mL via INTRAVENOUS

## 2013-09-03 MED ORDER — ASPIRIN EC 81 MG PO TBEC
81.0000 mg | DELAYED_RELEASE_TABLET | Freq: Every day | ORAL | Status: DC
  Administered 2013-09-04 – 2013-09-10 (×8): 81 mg via ORAL
  Filled 2013-09-03 (×9): qty 1

## 2013-09-03 MED ORDER — HEPARIN SODIUM (PORCINE) 5000 UNIT/ML IJ SOLN
5000.0000 [IU] | Freq: Three times a day (TID) | INTRAMUSCULAR | Status: DC
  Administered 2013-09-04 – 2013-09-11 (×20): 5000 [IU] via SUBCUTANEOUS
  Filled 2013-09-03 (×26): qty 1

## 2013-09-03 MED ORDER — RIFAXIMIN 550 MG PO TABS
550.0000 mg | ORAL_TABLET | Freq: Three times a day (TID) | ORAL | Status: DC
  Administered 2013-09-04 – 2013-09-10 (×21): 550 mg via ORAL
  Filled 2013-09-03 (×22): qty 1

## 2013-09-03 MED ORDER — SODIUM CHLORIDE 0.9 % IV SOLN
INTRAVENOUS | Status: DC
  Administered 2013-09-03 – 2013-09-08 (×5): via INTRAVENOUS

## 2013-09-03 NOTE — ED Notes (Signed)
Ct called and made aware of patient finished drinking contrast.

## 2013-09-03 NOTE — Progress Notes (Signed)
OFFICE NOTE  09/03/2013  CC:  Chief Complaint  Raymond Larsen presents with  . Follow-up     HPI: Raymond Larsen is a 80 y.o. Caucasian male who is here for 2 week f/u for cirrhosis and wound dehiscence. Has been declining lately, esp last 48h.  Daughters here with him today, say he's been somewhat confused last 2d, not eating or drinking well, has been complaining of abdominal pain.  Nurse report from his ALF today said right lower abd wound dehiscence had foul smelling discharge when they changed the packing.  Right groin crease redness increasing. No known fevers.  No vomiting but occ nausea.  No diarrhea.  +Generalized weakness has increased. No focal weakness.  No CP, no palpitations, no SOB.  Pertinent PMH:  Past Medical History  Diagnosis Date  . Arthritis     Shoulders and knees  . Hyperlipidemia   . Hypertension   . A-fib     Dr Wynonia Lawman; WAS on Coumadin until 04/2012  . Angina     uses NTG prn  . Pneumonia     12 /2012.  [facility] pneumonia/aspiration pneumonia 2015  . Hypothyroidism     x 10 yrs  . GERD (gastroesophageal reflux disease)   . Constipation, chronic   . Depression   . History of dizziness   . Anxiety   . Urinary frequency   . Hearing loss of both ears     wears hearing aides  . BPH with obstruction/lower urinary tract symptoms     sees Dr Gaynelle Arabian  . Compression fracture of thoracic vertebra, non-traumatic 06/14/12    T6 and T7 (noted on CT angio chest to r/o PE)  . Alcoholic cirrhosis of liver with ascites 06/14/12    Noted on CT angio chest to r/o PE  . Esophageal varices in alcoholic cirrhosis "   "    "   "    "  . Intertrochanteric fracture of left hip 05/24/2013  . HCAP (healthcare-associated pneumonia) 07/13/2013  . Postoperative anemia due to acute blood loss 05/27/2013   Past Surgical History  Procedure Laterality Date  . Total knee arthroplasty  1994    right  . Total shoulder replacement  1997    right  . Joint replacement      Rt knee and  shoulder  . Hernia repair  1990    LIH repair  . Cholecystectomy  2004  . Inguinal hernia repair  05/09/2011    Procedure: HERNIA REPAIR INGUINAL ADULT;  Surgeon: Odis Hollingshead, MD;  Location: [place];  Service: General;  Laterality: Right;  . Femur im nail Left 05/25/2013    Procedure: INTRAMEDULLARY (IM) NAIL FEMORAL;  Surgeon: Johnny Bridge, MD;  Location: [place];  Service: Orthopedics;  Laterality: Left;  . Femoral hernia repair Right 07/13/2013    Procedure: REPAIR RIGHT INCARCERATED FEMORAL HERNIA WITH MESH;  Surgeon: Stark Klein, MD;  Location: [place];  Service: General;  Laterality: Right;  . Laparotomy N/A 07/13/2013    Procedure: EXPLORATORY LAPAROTOMY;  Surgeon: Stark Klein, MD;  Location: [place];  Service: General;  Laterality: N/A;  . Bowel resection N/A 07/13/2013    Procedure: SMALL BOWEL RESECTION;  Surgeon: Stark Klein, MD;  Location: [place];  Service: General;  Laterality: N/A;   History   Social History Narrative   [facility].   Wife with dementia.   Worked all his life in [employer], did carpentry work on the side.  Served in the [employer] (  [facility] [place]   Hx of alcohol abuse up until his 32s.   Says he smoked "years ago" but "I never did inhale".   No chewing tobacco or dip.                         MEDS:  Outpatient Prescriptions Prior to Visit  Medication Sig Dispense Refill  . acetaminophen (TYLENOL) 500 MG tablet Take 1,000 mg by mouth every 6 (six) hours as needed for headache. Do not exceed more than 4000 mg in a 24 hour period      . aspirin EC 81 MG tablet Take 81 mg by mouth at bedtime.      . finasteride (PROSCAR) 5 MG tablet Take 5 mg by mouth at bedtime.       . furosemide (LASIX) 20 MG tablet 2 tabs po qd  60 tablet  1  . guaiFENesin (ROBITUSSIN) 100 MG/5ML SOLN Take 200 mg by mouth every 6 (six) hours as needed for cough or to loosen phlegm. Do not exceed 4 doses in 24 hours.  If cough has not improved in 24 hours notify physicians       . levothyroxine (SYNTHROID, LEVOTHROID) 100 MCG tablet Take 100 mcg by mouth daily before breakfast.       . LORazepam (ATIVAN) 0.5 MG tablet Take 1 tablet (0.5 mg total) by mouth 2 (two) times daily as needed for anxiety or sleep.  10 tablet  0  . meclizine (ANTIVERT) 25 MG tablet Take 25 mg by mouth 2 (two) times daily as needed for dizziness.      . metoprolol tartrate (LOPRESSOR) 25 MG tablet Take 0.5 tablets (12.5 mg total) by mouth 2 (two) times daily.  60 tablet  5  . mirtazapine (REMERON) 30 MG tablet Take 30 mg by mouth at bedtime.       . nitroGLYCERIN (NITROSTAT) 0.4 MG SL tablet Place 0.4 mg under the tongue every 5 (five) minutes as needed for chest pain. Give 1 tablet every 5 minutes for chest pain x 3 doses.  If unrelieved after 3 doses, call MD      . pantoprazole (PROTONIX) 40 MG tablet Take 40 mg by mouth at bedtime.       . polyethylene glycol (MIRALAX / GLYCOLAX) packet Take 17 g by mouth daily as needed (constipation). Mix in 8 oz of water and drink      . sennosides-docusate sodium (SENOKOT-S) 8.6-50 MG tablet Take 2 tablets by mouth daily.  30 tablet  1  . spironolactone (ALDACTONE) 25 MG tablet 2 tabs po qd  60 tablet  1  . vitamin B-12 (CYANOCOBALAMIN) 1000 MCG tablet Take 1,000 mcg by mouth daily.       No facility-administered medications prior to visit.    PE: Blood pressure 119/62, pulse 82, temperature 98.2 F (36.8 C), temperature source Temporal, resp. rate 18, SpO2 98.00%. Gen: alert, tired appearing but NAD. Oriented to person and place. He does not attend well.   Oral mucosa pink and moist.   CV: RRR, rate about 90, no murmur or rub. LUNGS: CTA bilat, mildly diminished Bs in both bases ABD: soft but mildly distended, with normal BS but diffuse tenderness to palpation.  Right lower abd wall surgical wound with 1 cm opening that has iodoform gauze in it.  No active exudate, no erythema at this site.  He has diffuse, maculopapular erythema in right groin  crease c/w irritative rash possibly  superinfected with candida. EXT: 3-4+ pitting edema in both LL's  IMPRESSION AND PLAN:  1) Cirrhosis with portal HTN, ascites, suspect possible SBP: needs further eval in ED/hospital. EMS transfer has been called for.  Pt's daughter and pt are aware of plan and agree.  An After Visit Summary was printed and given to the Raymond Larsen.   FOLLOW UP: To be determined based on results of pending workup/hospitalization.

## 2013-09-03 NOTE — ED Notes (Signed)
Patient given something to eat per MD approval.

## 2013-09-03 NOTE — H&P (Addendum)
Hospitalist Admission History and Physical  Patient name: Raymond Hovis Sr. Medical record number: [id number] Date of birth: Aug 29, 1922 Age: 80 y.o. Gender: male  Primary Care Provider: Tammi Sou, MD  Chief Complaint: encephalopathy, ascites, wound infection, ? UTI    History of Present Illness:This is a 80 y.o. year old male with prior history of coronary artery disease, A. fib, alcoholic cirrhosis (quit heavy drinking 30 years ago), incarcerated inguinal hernia status post repair March 2015 presenting with encephalopathy and wound infection. Daughter's primary caregiver. Patient currently lives in an assisted-living facility. Daughter states that patient has had progressive confusion over the past 3-4 days. Denies any fevers or chills. Patient has also had some clear drainage from his prior incision site from his hernia repair. Daughter states that this drainage has mildly increased over the past 2 or 3 days as well. There is also erythematous around the drainage site. Patient was seen on April 13 for surgery followup. Was noted to have had a small area of infection the right groin which was open. Was seen by PCP today. There was some concern for SBP. Daughter reports some abdominal pain 22 days ago but none over the past 12-24 hours. On presentation to the ER, patient hemodynamically stable. Afebrile. White blood cell count within normal limits 7.6. Ammonia level 86. UA mildly indicative of infection. A CT of abdomen was obtained that showed a large amount of ascites and nodular hepatic contour concerning for cirrhosis. There was also fluid tracking into the region of the right inguinal canal with some foci of gas and trace fluid without evidence of bowel entrapment concerning for possible irritation to the skin surface. Chest x-ray negative for pneumonia. Per ER physician assistant, she discuss case with general surgery. There was no concern for infection. Confusion felt likely to be due to hepatic  encephalopathy.  Patient Active Problem List   Diagnosis Date Noted  . Encephalopathy 09/03/2013  . Abdominal wound dehiscence 08/13/2013  . Protein-calorie malnutrition, severe 05/25/2013  . Shoulder arthritis 05/15/2013  . Arthritis of left knee 05/15/2013  . Chronic musculoskeletal pain 09/22/2012  . Normocytic anemia 07/27/2012  . Peripheral edema 07/20/2012  . Hypothyroidism 07/20/2012  . CAD (coronary artery disease)   . Alcoholic cirrhosis of liver with ascites   . Atrial fibrillation 03/09/2012  . BPH (benign prostatic hyperplasia) 03/09/2012  . Hyperlipidemia 03/09/2012  . Hypertension    Past Medical History: Past Medical History  Diagnosis Date  . Arthritis     Shoulders and knees  . Hyperlipidemia   . Hypertension   . A-fib     Dr Wynonia Lawman; WAS on Coumadin until 04/2012  . Angina     uses NTG prn  . Pneumonia     12 /2012.  HC assoc pneumonia/aspiration pneumonia 2015  . Hypothyroidism     x 10 yrs  . GERD (gastroesophageal reflux disease)   . Constipation, chronic   . Depression   . History of dizziness   . Anxiety   . Urinary frequency   . Hearing loss of both ears     wears hearing aides  . BPH with obstruction/lower urinary tract symptoms     sees Dr Gaynelle Arabian  . Compression fracture of thoracic vertebra, non-traumatic 06/14/12    T6 and T7 (noted on CT angio chest to r/o PE)  . Alcoholic cirrhosis of liver with ascites 06/14/12    Noted on CT angio chest to r/o PE  . Esophageal varices in alcoholic cirrhosis "   "    "   "    "  .  Intertrochanteric fracture of left hip 05/24/2013  . HCAP (healthcare-associated pneumonia) 07/13/2013  . Postoperative anemia due to acute blood loss 05/27/2013    Past Surgical History: Past Surgical History  Procedure Laterality Date  . Total knee arthroplasty  1994    right  . Total shoulder replacement  1997    right  . Joint replacement      Rt knee and shoulder  . Hernia repair  1990    LIH repair  .  Cholecystectomy  2004  . Inguinal hernia repair  05/09/2011    Procedure: HERNIA REPAIR INGUINAL ADULT;  Surgeon: Odis Hollingshead, MD;  Location: [facility];  Service: General;  Laterality: Right;  . Femur im nail Left 05/25/2013    Procedure: INTRAMEDULLARY (IM) NAIL FEMORAL;  Surgeon: Johnny Bridge, MD;  Location: [facility];  Service: Orthopedics;  Laterality: Left;  . Femoral hernia repair Right 07/13/2013    Procedure: REPAIR RIGHT INCARCERATED FEMORAL HERNIA WITH MESH;  Surgeon: Stark Klein, MD;  Location: [facility];  Service: General;  Laterality: Right;  . Laparotomy N/A 07/13/2013    Procedure: EXPLORATORY LAPAROTOMY;  Surgeon: Stark Klein, MD;  Location: [facility];  Service: General;  Laterality: N/A;  . Bowel resection N/A 07/13/2013    Procedure: SMALL BOWEL RESECTION;  Surgeon: Stark Klein, MD;  Location: [facility];  Service: General;  Laterality: N/A;    Social History: History   Social History  . Marital Status: Married    Spouse Name: N/A    Number of Children: N/A  . Years of Education: N/A   Social History Main Topics  . Smoking status: Former Smoker -- 0.50 packs/day for 35 years    Types: Cigarettes    Quit date: 05/02/1984  . Smokeless tobacco: Current User    Types: Chew  . Alcohol Use: No  . Drug Use: No  . Sexual Activity: Not Currently   Other Topics Concern  . 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   Wife with dementia.   Worked all his life in [employer], did carpentry work on the side.  Served in the [employer] ([employer] in [place]   Hx of alcohol abuse up until his 13s.   Says he smoked "years ago" but "I never did inhale".   No chewing tobacco or dip.                         Family History: Family History  Problem Relation Age of Onset  . Alcoholism Father   . Cirrhosis Father     Allergies: Allergies  Allergen Reactions  . Amitriptyline Other (See Comments)    Dizziness   . Beta Adrenergic Blockers Other (See Comments)     lethargy  . Imdur [Isosorbide]     Headache   . Niacin And Related Rash       . Sulfa Antibiotics Itching, Nausea And Vomiting and Rash    Current Facility-Administered Medications  Medication Dose Route Frequency Provider Last Rate Last Dose  . 0.9 %  sodium chloride infusion   Intravenous Continuous Shanda Howells, MD      . cefoTAXime (CLAFORAN) 2 g in dextrose 5 % 50 mL IVPB  2 g Intravenous 3 times per day Shanda Howells, MD      . heparin injection 5,000 Units  5,000 Units Subcutaneous 3 times per day Shanda Howells, MD      . sodium chloride 0.9 % injection  3 mL  3 mL Intravenous Q12H Shanda Howells, MD       Current Outpatient Prescriptions  Medication Sig Dispense Refill  . acetaminophen (TYLENOL) 500 MG tablet Take 1,000 mg by mouth every 6 (six) hours as needed for headache. Do not exceed more than 4000 mg in a 24 hour period      . aspirin EC 81 MG tablet Take 81 mg by mouth at bedtime.      . finasteride (PROSCAR) 5 MG tablet Take 5 mg by mouth at bedtime.       . furosemide (LASIX) 20 MG tablet Take 40 mg by mouth daily.      Marland Kitchen guaiFENesin (ROBITUSSIN) 100 MG/5ML SOLN Take 200 mg by mouth every 6 (six) hours as needed for cough or to loosen phlegm. Do not exceed 4 doses in 24 hours.  If cough has not improved in 24 hours notify physicians      . levothyroxine (SYNTHROID, LEVOTHROID) 100 MCG tablet Take 100 mcg by mouth daily before breakfast.       . LORazepam (ATIVAN) 0.5 MG tablet Take 1 tablet (0.5 mg total) by mouth 2 (two) times daily as needed for anxiety or sleep.  10 tablet  0  . meclizine (ANTIVERT) 25 MG tablet Take 25 mg by mouth 2 (two) times daily as needed for dizziness.      . metoprolol tartrate (LOPRESSOR) 25 MG tablet Take 0.5 tablets (12.5 mg total) by mouth 2 (two) times daily.  60 tablet  5  . mirtazapine (REMERON) 30 MG tablet Take 30 mg by mouth at bedtime.       . nitroGLYCERIN (NITROSTAT) 0.4 MG SL tablet Place 0.4 mg under the tongue every 5 (five)  minutes as needed for chest pain. Give 1 tablet every 5 minutes for chest pain x 3 doses.  If unrelieved after 3 doses, call MD      . pantoprazole (PROTONIX) 40 MG tablet Take 40 mg by mouth at bedtime.       . polyethylene glycol (MIRALAX / GLYCOLAX) packet Take 17 g by mouth daily as needed (constipation). Mix in 8 oz of water and drink      . sennosides-docusate sodium (SENOKOT-S) 8.6-50 MG tablet Take 2 tablets by mouth daily.  30 tablet  1  . spironolactone (ALDACTONE) 25 MG tablet Take 50 mg by mouth daily.      . vitamin B-12 (CYANOCOBALAMIN) 1000 MCG tablet Take 1,000 mcg by mouth daily.       Review Of Systems: 12 point ROS negative except as noted above in HPI.  Physical Exam: Filed Vitals:   09/03/13 2030  BP: 113/58  Pulse: 84  Temp:   Resp: 19    General: confused, non combative  HEENT: PERRLA, extra ocular movement intact and mildly dry oral mucosa  Heart: S1, S2 normal, no murmur, rub or gallop, regular rate and rhythm Lungs: clear to auscultation, no wheezes or rales and unlabored breathing Abdomen: abdomen is soft without significant tenderness, masses, organomegaly or guarding, + erythema and drainage of clear fluid in R inguinal area  Extremities: 2+ peripheral pulses, 2-3 + pitting edema bilaterally  Skin:no rashes, no ecchymoses Neurology: normal without focal findings  Labs and Imaging: Lab Results  Component Value Date/Time   NA 141 09/03/2013  5:02 PM   K 3.9 09/03/2013  5:02 PM   CL 103 09/03/2013  5:02 PM   CO2 26 09/03/2013  4:45 PM   BUN 12 09/03/2013  5:02 PM   CREATININE 1.00 09/03/2013  5:02 PM   CREATININE 0.85 07/25/2012  4:19 PM   GLUCOSE 82 09/03/2013  5:02 PM   Lab Results  Component Value Date   WBC 7.6 09/03/2013   HGB 11.6* 09/03/2013   HCT 34.0* 09/03/2013   MCV 93.4 09/03/2013   PLT 167 09/03/2013   Urinalysis    Component Value Date/Time   COLORURINE AMBER* 09/03/2013 1701   APPEARANCEUR CLEAR 09/03/2013 1701   LABSPEC 1.019 09/03/2013  1701   PHURINE 7.5 09/03/2013 1701   GLUCOSEU NEGATIVE 09/03/2013 1701   HGBUR NEGATIVE 09/03/2013 1701   BILIRUBINUR SMALL* 09/03/2013 1701   KETONESUR NEGATIVE 09/03/2013 1701   PROTEINUR NEGATIVE 09/03/2013 1701   UROBILINOGEN 1.0 09/03/2013 1701   NITRITE NEGATIVE 09/03/2013 1701   LEUKOCYTESUR TRACE* 09/03/2013 1701       Ct Abdomen Pelvis W Contrast  09/03/2013   CLINICAL DATA:  Drainage from hernia repair site, confusion  EXAM: CT ABDOMEN AND PELVIS WITH CONTRAST  TECHNIQUE: Multidetector CT imaging of the abdomen and pelvis was performed using the standard protocol following bolus administration of intravenous contrast.  CONTRAST:  14mL OMNIPAQUE IOHEXOL 300 MG/ML  SOLN  COMPARISON:  07/13/2013, 03/09/2012  FINDINGS: There is a large amount of ascites. Hepatic contour is nodular which may be seen with cirrhosis. Spleen is at upper limits of normal in size without focal abnormality. Right renal cortical cysts are reidentified. No radiopaque renal or ureteral calculus. Left kidney, adrenal glands, and pancreas are unremarkable. Cholecystectomy clips are noted. Mild prominence of the common bile duct is identified measuring 1 cm at its mid portion image 28.  Gas is present within the soft tissues over the right inguinal region for example image 87, with a small amount of fluid subjacent to this area. Foci of gas in the visualized within the right inguinal region subcutaneous fat. There is apparent laxity of the right inguinal region with fluid bulging into the right inguinal canal image 78. No evidence for bowel entrapment or obstruction. The patient is status post left mid abdominal bowel anastomosis. Splenorenal vascular collaterals are noted. Rightward scoliosis of the thoracolumbar spine is noted with extensive multilevel disc degenerative change.  IMPRESSION: Large amount of ascites with nodular hepatic contour and splenorenal collaterals suggesting cirrhosis.  Some fluid is noted tracking into the  region of the right inguinal canal which may indicate laxity. Foci of gas and trace fluid are identified within the right inguinal hernia repair site without evidence of bowel entrapment or obstruction, which could indicate communication with the skin surface, or fluid within the wound representing seroma or abscess, or communication with the abdominal cavity.  These results were called by telephone at the time of interpretation on 09/03/2013 at 5:57 PM to [facility] , who verbally acknowledged these results.   Electronically Signed   By: Conchita Paris M.D.   On: 09/03/2013 18:01   Dg Chest Port 1 View  09/03/2013   CLINICAL DATA:  Abdominal pain last 4 days  EXAM: PORTABLE CHEST - 1 VIEW  COMPARISON:  07/13/2013  FINDINGS: Cardiomediastinal silhouette is stable. Right shoulder prosthesis again noted. No acute infiltrate or pleural effusion. No pulmonary edema.  IMPRESSION: No active disease.   Electronically Signed   By: Lahoma Crocker M.D.   On: 09/03/2013 17:09     Assessment and Plan: Zackary Mckeone Sr. is a 80 y.o. year old male presenting with encephalopathy, wound infection, ?UTI   Encephalopathy: Suspect hepatic source given  cirrhosis and elevated ammonia level. Will start on lactulose and rifaximin. Differential diagnosis also includes infectious sources. Will start on vancomycin and cefotaxime for SBP, UTI, wound infection coverage. Continue to follow.  Wound infection: Overall exam seems more consistent with wound site cellulitis. However, CT findings are somewhat concerning. No peritoneal findings on exam today. General surgery consult pending. We'll place on vancomycin and cefotaxime as above. Should give adequate coverage for SBP and cellulitis. Panculture.  Cirrhosis/ascites: Suspect progression of cirrhotic disease. Recent surgical procedure may have exacerbated this. Plan for paracentesis. Can be done via IR. Continue home spironolactone and Lasix regimen. TED hose. Preliminarily with the  child Pugh class B-C pending INR. Noted elevated alkaline phosphatase in the setting of cirrhotic disease. Check GGT. Continue to follow.  HTN: Continue beta blocker   FEN/GI: NPO. PPI  Prophylaxis: sub g heparin  Disposition: pending further evaluation  Code Status:DNR.        Shanda Howells MD  Pager: [phone]

## 2013-09-03 NOTE — Progress Notes (Signed)
ANTIBIOTIC CONSULT NOTE - INITIAL  Pharmacy Consult for vancomycin Indication: Cellulitis  Allergies  Allergen Reactions  . Amitriptyline Other (See Comments)    Dizziness   . Beta Adrenergic Blockers Other (See Comments)    lethargy  . Imdur [Isosorbide]     Headache   . Niacin And Related Rash       . Sulfa Antibiotics Itching, Nausea And Vomiting and Rash    Patient Measurements:   Adjusted Body Weight: 73.5 kg  Vital Signs: Temp: 97.1 F (36.2 C) (05/13 1526) Temp src: Oral (05/13 1526) BP: 94/52 mmHg (05/13 2100) Pulse Rate: 82 (05/13 2100) Intake/Output from previous day:   Intake/Output from this shift:    Labs:  Recent Labs  09/03/13 1645 09/03/13 1702 09/03/13 2054  WBC 7.6  --  7.1  HGB 10.8* 11.6* 9.9*  PLT 167  --  146*  CREATININE 0.91 1.00  --    The CrCl is unknown because both a height and weight (above a minimum accepted value) are required for this calculation. No results found for this basename: VANCOTROUGH, VANCOPEAK, VANCORANDOM, GENTTROUGH, GENTPEAK, GENTRANDOM, TOBRATROUGH, TOBRAPEAK, TOBRARND, AMIKACINPEAK, AMIKACINTROU, AMIKACIN,  in the last 72 hours   Microbiology: No results found for this or any previous visit (from the past 720 hour(s)).  Medical History: Past Medical History  Diagnosis Date  . Arthritis     Shoulders and knees  . Hyperlipidemia   . Hypertension   . A-fib     Dr Wynonia Lawman; WAS on Coumadin until 04/2012  . Angina     uses NTG prn  . Pneumonia     12 /2012.  HC assoc pneumonia/aspiration pneumonia 2015  . Hypothyroidism     x 10 yrs  . GERD (gastroesophageal reflux disease)   . Constipation, chronic   . Depression   . History of dizziness   . Anxiety   . Urinary frequency   . Hearing loss of both ears     wears hearing aides  . BPH with obstruction/lower urinary tract symptoms     sees Dr Gaynelle Arabian  . Compression fracture of thoracic vertebra, non-traumatic 06/14/12    T6 and T7 (noted on CT angio  chest to r/o PE)  . Alcoholic cirrhosis of liver with ascites 06/14/12    Noted on CT angio chest to r/o PE  . Esophageal varices in alcoholic cirrhosis "   "    "   "    "  . Intertrochanteric fracture of left hip 05/24/2013  . HCAP (healthcare-associated pneumonia) 07/13/2013  . Postoperative anemia due to acute blood loss 05/27/2013    Assessment: 53 YOM with hx of Alcoholic cirrhosis, s/p recent inguinal hernia repair in March, presented with encephalopathy, wound infection, and possible UTI. Pharmacy is consulted to start vancomycin for cellulitis. Scr 1.0, est. crcl ~ 50 ml/min. Blood and urine cultures are pending.  Goal of Therapy:  Vancomycin trough level 10-15 mcg/ml  Plan:  - Vancomycin 1250 mg IV Q 24 hrs - f/u renal function and cultures - Vancomycin trough at steady state  Maryanna Shape, PharmD, BCPS  Clinical Pharmacist  Pager: [phone]   09/03/2013,9:19 PM

## 2013-09-03 NOTE — ED Notes (Signed)
Pt from [facility] in [place], pt lives in assisted living. Patient has been increasingly confused over the past few days. Taken to PCP for evaluation. Pt has had Hernia repair in March- drainage with foul smelling odor.

## 2013-09-03 NOTE — ED Provider Notes (Signed)
CSN: [id number]     Arrival date & time 09/03/13  1517 History   First MD Initiated Contact with Patient 09/03/13 1526     Chief Complaint  Patient presents with  . Altered Mental Status  . Abdominal Pain     (Consider location/radiation/quality/duration/timing/severity/associated sxs/prior Treatment) HPI  Patient is a 80 y.o.  Male with PMH of alcoholic cihrosis, afib, angina. He is in SNF for rehab after Right incarcerated femoral hernia requiring bowel resection.  Sent in by his PCP for AMS, concern for possible SBP. He was seen today and PCP for  f/u for cirrhosis and wound dehiscence.  Has been declining lately, esp last 48h. Daughters  say he's been somewhat confused  Decreased PO intake.  He's  Been complaining of abdominal pain. Nurse report from his ALF today said right lower abd wound dehiscence had foul smelling discharge when they changed the packing. Right groin crease redness increasing despite interventions. No known fevers. No vomiting but occ nausea. No diarrhea. +Generalized weakness has increased.  No focal weakness. No CP, no palpitations, no SOB.   Past Medical History  Diagnosis Date  . Arthritis     Shoulders and knees  . Hyperlipidemia   . Hypertension   . A-fib     Dr Wynonia Lawman; WAS on Coumadin until 04/2012  . Angina     uses NTG prn  . Pneumonia     12 /2012.  HC assoc pneumonia/aspiration pneumonia 2015  . Hypothyroidism     x 10 yrs  . GERD (gastroesophageal reflux disease)   . Constipation, chronic   . Depression   . History of dizziness   . Anxiety   . Urinary frequency   . Hearing loss of both ears     wears hearing aides  . BPH with obstruction/lower urinary tract symptoms     sees Dr Gaynelle Arabian  . Compression fracture of thoracic vertebra, non-traumatic 06/14/12    T6 and T7 (noted on CT angio chest to r/o PE)  . Alcoholic cirrhosis of liver with ascites 06/14/12    Noted on CT angio chest to r/o PE  . Esophageal varices in alcoholic  cirrhosis "   "    "   "    "  . Intertrochanteric fracture of left hip 05/24/2013  . HCAP (healthcare-associated pneumonia) 07/13/2013  . Postoperative anemia due to acute blood loss 05/27/2013   Past Surgical History  Procedure Laterality Date  . Total knee arthroplasty  1994    right  . Total shoulder replacement  1997    right  . Joint replacement      Rt knee and shoulder  . Hernia repair  1990    LIH repair  . Cholecystectomy  2004  . Inguinal hernia repair  05/09/2011    Procedure: HERNIA REPAIR INGUINAL ADULT;  Surgeon: Odis Hollingshead, MD;  Location: [facility];  Service: General;  Laterality: Right;  . Femur im nail Left 05/25/2013    Procedure: INTRAMEDULLARY (IM) NAIL FEMORAL;  Surgeon: Johnny Bridge, MD;  Location: [facility];  Service: Orthopedics;  Laterality: Left;  . Femoral hernia repair Right 07/13/2013    Procedure: REPAIR RIGHT INCARCERATED FEMORAL HERNIA WITH MESH;  Surgeon: Stark Klein, MD;  Location: [facility];  Service: General;  Laterality: Right;  . Laparotomy N/A 07/13/2013    Procedure: EXPLORATORY LAPAROTOMY;  Surgeon: Stark Klein, MD;  Location: [facility];  Service: General;  Laterality: N/A;  . Bowel resection N/A 07/13/2013  Procedure: SMALL BOWEL RESECTION;  Surgeon: Stark Klein, MD;  Location: [facility];  Service: General;  Laterality: N/A;   Family History  Problem Relation Age of Onset  . Alcoholism Father   . Cirrhosis Father    History  Substance Use Topics  . Smoking status: Former Smoker -- 0.50 packs/day for 35 years    Types: Cigarettes    Quit date: 05/02/1984  . Smokeless tobacco: Current User    Types: Chew  . Alcohol Use: No    Review of Systems Ten systems reviewed and are negative for acute change, except as noted in the HPI.    ROS   Allergies  Amitriptyline; Beta adrenergic blockers; Imdur; Niacin and related; and Sulfa antibiotics  Home Medications   Prior to Admission medications   Medication Sig Start Date End Date Taking?  Authorizing Provider  acetaminophen (TYLENOL) 500 MG tablet Take 1,000 mg by mouth every 6 (six) hours as needed for headache. Do not exceed more than 4000 mg in a 24 hour period    Historical Provider, MD  aspirin EC 81 MG tablet Take 81 mg by mouth at bedtime.    Historical Provider, MD  finasteride (PROSCAR) 5 MG tablet Take 5 mg by mouth at bedtime.  05/01/12   Historical Provider, MD  furosemide (LASIX) 20 MG tablet 2 tabs po qd 08/13/13   Tammi Sou, MD  guaiFENesin (ROBITUSSIN) 100 MG/5ML SOLN Take 200 mg by mouth every 6 (six) hours as needed for cough or to loosen phlegm. Do not exceed 4 doses in 24 hours.  If cough has not improved in 24 hours notify physicians    Historical Provider, MD  levothyroxine (SYNTHROID, LEVOTHROID) 100 MCG tablet Take 100 mcg by mouth daily before breakfast.     Historical Provider, MD  LORazepam (ATIVAN) 0.5 MG tablet Take 1 tablet (0.5 mg total) by mouth 2 (two) times daily as needed for anxiety or sleep. 07/21/13   Shanker Kristeen Mans, MD  meclizine (ANTIVERT) 25 MG tablet Take 25 mg by mouth 2 (two) times daily as needed for dizziness.    Historical Provider, MD  metoprolol tartrate (LOPRESSOR) 25 MG tablet Take 0.5 tablets (12.5 mg total) by mouth 2 (two) times daily. 07/21/13   Shanker Kristeen Mans, MD  mirtazapine (REMERON) 30 MG tablet Take 30 mg by mouth at bedtime.  01/04/11   Historical Provider, MD  nitroGLYCERIN (NITROSTAT) 0.4 MG SL tablet Place 0.4 mg under the tongue every 5 (five) minutes as needed for chest pain. Give 1 tablet every 5 minutes for chest pain x 3 doses.  If unrelieved after 3 doses, call MD    Historical Provider, MD  pantoprazole (PROTONIX) 40 MG tablet Take 40 mg by mouth at bedtime.  01/24/11   Historical Provider, MD  polyethylene glycol (MIRALAX / GLYCOLAX) packet Take 17 g by mouth daily as needed (constipation). Mix in 8 oz of water and drink    Historical Provider, MD  sennosides-docusate sodium (SENOKOT-S) 8.6-50 MG tablet Take 2  tablets by mouth daily. 05/25/13   Johnny Bridge, MD  spironolactone (ALDACTONE) 25 MG tablet 2 tabs po qd 08/13/13   Tammi Sou, MD  vitamin B-12 (CYANOCOBALAMIN) 1000 MCG tablet Take 1,000 mcg by mouth daily.    Historical Provider, MD   BP 112/63  Pulse 82  Temp(Src) 97.1 F (36.2 C) (Oral)  Resp 14  SpO2 96% Physical Exam  Nursing note and vitals reviewed. Constitutional: He is oriented to person,  place, and time. He appears well-developed and well-nourished. No distress.  Chronically ill appearing, NAD  HENT:  Head: Normocephalic and atraumatic.  Eyes: Conjunctivae are normal. No scleral icterus.  Neck: Normal range of motion. Neck supple. No JVD present.  Cardiovascular: Normal rate, regular rhythm and normal heart sounds.   2+ pitting edema BL LE  Pulmonary/Chest: Effort normal and breath sounds normal. No respiratory distress.  Abdominal: Soft. He exhibits distension. There is no tenderness.  + ascites Wound in right groin. Packing in place. No foul odor or purulent discharge.  Angry, red skin ? candidiasis  Musculoskeletal: Normal range of motion.  Neurological: He is alert and oriented to person, place, and time. He has normal reflexes.  Skin: Skin is warm and dry. He is not diaphoretic.  Psychiatric: His behavior is normal.    ED Course  Procedures (including critical care time) Labs Review Labs Reviewed - No data to display  Imaging Review Dg Chest Port 1 View  09/03/2013   CLINICAL DATA:  Abdominal pain last 4 days  EXAM: PORTABLE CHEST - 1 VIEW  COMPARISON:  07/13/2013  FINDINGS: Cardiomediastinal silhouette is stable. Right shoulder prosthesis again noted. No acute infiltrate or pleural effusion. No pulmonary edema.  IMPRESSION: No active disease.   Electronically Signed   By: Lahoma Crocker M.D.   On: 09/03/2013 17:09     EKG Interpretation None      MDM   Final diagnoses:  None   5:54 PM Filed Vitals:   09/03/13 1600 09/03/13 1630 09/03/13 1700  09/03/13 1726  BP: 113/62 106/60 116/57 116/57  Pulse: 82 81 81 81  Temp:      TempSrc:      Resp: 14 14 18 18   SpO2: 98% 97% 97% 98%    Patietn here for AMS. VSS Elevated ammonia and lactate 2.25 CT abdomen pending. Seen in shared visit with Dr. Hulen Skains.    6:03 PM CT scan shows large ascites in the pelvis. Mild ascites throughout the abdomen. There is a questionable area of lacking between the abdomen and the surgical site in his groin. We will consult surgery to reviewed images and patient.  6:20 PM  Spoke with Dr. Hulen Skains who reviewed the films with me. He states that with ascites there can be some leaking through a weak point in the abdominal wall. He is not leaking clear fluid and no concern for infection at the wound site. He has no fever, no leukocytosis. No abdominal tenderness, pain, or heat, I am not concerned for SBP.   7:36 PM BP 110/62  Pulse 82  Temp(Src) 97.1 F (36.2 C) (Oral)  Resp 15  SpO2 97% I have discussed the case with Dr. Ernestina Patches who will see and evaluate the patient here in the ED for admission  Margarita Mail, PA-C 09/05/13 5701

## 2013-09-03 NOTE — Progress Notes (Signed)
Pre visit review using our clinic review tool, if applicable. No additional management support is needed unless otherwise documented below in the visit note. 

## 2013-09-03 NOTE — ED Provider Notes (Signed)
Dr. Hulen Skains and Dr. Ernestina Patches confer together.  Dr. Dema Severin will examine the patient.  After he is right on the floor.  No further intervention in the emergency department, as needed.  At this time  Garald Balding, NP 09/03/13 2055

## 2013-09-03 NOTE — ED Notes (Signed)
Dr.Walden at the bedside.  

## 2013-09-04 ENCOUNTER — Inpatient Hospital Stay (HOSPITAL_COMMUNITY): Payer: Medicare HMO

## 2013-09-04 DIAGNOSIS — E7220 Disorder of urea cycle metabolism, unspecified: Secondary | ICD-10-CM

## 2013-09-04 DIAGNOSIS — K7030 Alcoholic cirrhosis of liver without ascites: Secondary | ICD-10-CM

## 2013-09-04 DIAGNOSIS — R609 Edema, unspecified: Secondary | ICD-10-CM

## 2013-09-04 LAB — MRSA PCR SCREENING: MRSA by PCR: NEGATIVE

## 2013-09-04 LAB — GLUCOSE, SEROUS FLUID: Glucose, Fluid: 85 mg/dL

## 2013-09-04 LAB — URINE CULTURE: Colony Count: >=100000

## 2013-09-04 LAB — COMPREHENSIVE METABOLIC PANEL
ALT: 12 U/L (ref 0–53)
AST: 32 U/L (ref 0–37)
Albumin: 1.8 g/dL — ABNORMAL LOW (ref 3.5–5.2)
Alkaline Phosphatase: 143 U/L — ABNORMAL HIGH (ref 39–117)
BUN: 14 mg/dL (ref 6–23)
CO2: 25 mEq/L (ref 19–32)
Calcium: 8.1 mg/dL — ABNORMAL LOW (ref 8.4–10.5)
Chloride: 108 mEq/L (ref 96–112)
Creatinine, Ser: 0.85 mg/dL (ref 0.50–1.35)
GFR calc Af Amer: 88 mL/min — ABNORMAL LOW (ref >90)
GFR calc non Af Amer: 76 mL/min — ABNORMAL LOW (ref >90)
Glucose, Bld: 87 mg/dL (ref 70–99)
Potassium: 3.8 mEq/L (ref 3.7–5.3)
Sodium: 142 mEq/L (ref 137–147)
Total Bilirubin: 0.9 mg/dL (ref 0.3–1.2)
Total Protein: 4.9 g/dL — ABNORMAL LOW (ref 6.0–8.3)

## 2013-09-04 LAB — ALBUMIN, FLUID (OTHER): Albumin, Fluid: 0.4 g/dL

## 2013-09-04 LAB — CBC WITH DIFFERENTIAL/PLATELET
Basophils Absolute: 0.1 10*3/uL (ref 0.0–0.1)
Basophils Relative: 1 % (ref 0–1)
Eosinophils Absolute: 0.5 10*3/uL (ref 0.0–0.7)
Eosinophils Relative: 9 % — ABNORMAL HIGH (ref 0–5)
HCT: 28.2 % — ABNORMAL LOW (ref 39.0–52.0)
Hemoglobin: 9.5 g/dL — ABNORMAL LOW (ref 13.0–17.0)
Lymphocytes Relative: 28 % (ref 12–46)
Lymphs Abs: 1.7 10*3/uL (ref 0.7–4.0)
MCH: 31.5 pg (ref 26.0–34.0)
MCHC: 33.7 g/dL (ref 30.0–36.0)
MCV: 93.4 fL (ref 78.0–100.0)
Monocytes Absolute: 0.8 10*3/uL (ref 0.1–1.0)
Monocytes Relative: 13 % — ABNORMAL HIGH (ref 3–12)
Neutro Abs: 3.2 10*3/uL (ref 1.7–7.7)
Neutrophils Relative %: 51 % (ref 43–77)
Platelets: 140 10*3/uL — ABNORMAL LOW (ref 150–400)
RBC: 3.02 MIL/uL — ABNORMAL LOW (ref 4.22–5.81)
RDW: 14.9 % (ref 11.5–15.5)
WBC: 6.2 10*3/uL (ref 4.0–10.5)

## 2013-09-04 LAB — GAMMA GT: GGT: 53 U/L — ABNORMAL HIGH (ref 7–51)

## 2013-09-04 LAB — BODY FLUID CELL COUNT WITH DIFFERENTIAL
Eos, Fluid: NONE SEEN %
Lymphs, Fluid: 58 %
Monocyte-Macrophage-Serous Fluid: 26 % — ABNORMAL LOW (ref 50–90)
NEUTROPHIL FLUID: 16 % (ref 0–25)
Total Nucleated Cell Count, Fluid: 175 cu mm (ref 0–1000)

## 2013-09-04 LAB — PROTEIN, BODY FLUID: Total protein, fluid: 0.9 g/dL

## 2013-09-04 LAB — LACTATE DEHYDROGENASE, PLEURAL OR PERITONEAL FLUID: LD FL: 51 U/L — AB (ref 3–23)

## 2013-09-04 NOTE — Progress Notes (Signed)
Utilization review completed. Khalil Belote, RN, BSN. 

## 2013-09-04 NOTE — Progress Notes (Signed)
Postive blood culture with gram positive cocci in clusters. Dr. Eulogio Bear notified.

## 2013-09-04 NOTE — ED Provider Notes (Signed)
Medical screening examination/treatment/procedure(s) were conducted as a shared visit with non-physician practitioner(s) and myself.  I personally evaluated the patient during the encounter.   EKG Interpretation None      Osvaldo Shipper, MD 09/04/13 [phone]

## 2013-09-04 NOTE — Progress Notes (Addendum)
INITIAL NUTRITION ASSESSMENT  DOCUMENTATION CODES Per approved criteria  -Severe malnutrition in the context of chronic illness   INTERVENTION:  Advance diet to Dys 2, thin liquids as medically appropriate (s/p bedside swallow evaluation 07/18/13) RD to follow for nutrition care plan  NUTRITION DIAGNOSIS: Inadequate oral intake related to inability to eat as evidenced by NPO status  Goal: Pt to meet >/= 90% of their estimated nutrition needs   Monitor:  PO diet advancement & intake, weight, labs, I/O's  Reason for Assessment: Malnutrition Screening Tool Report  80 y.o. male  Admitting Dx: encephalopathy, ascites, wound infection, ? UTI   ASSESSMENT: 80 y.o. year old male with PMH of CAD, A. fib, alcoholic cirrhosis, incarcerated inguinal hernia status post repair March 2015 presented with encephalopathy and wound infection.  CT of abdomen was obtained that showed a large amount of ascites and nodular hepatic contour concerning for cirrhosis.    Patient known to this RD from previous hospitalization; unable to obtain much nutrition hx from patient; hard of hearing; patient hospitalized in March of 2015 -- TPN initiated post-op following transition to oral diet (Dys 2, thin liquids); per wt readings, pt has had a 7% weight loss x 6 weeks -- severe for time frame; would benefit from addition of oral nutrition supplements when/as able.  Nutrition Focused Physical Exam:  Subcutaneous Fat:  Orbital Region: N/A Upper Arm Region: mild wasting Thoracic and Lumbar Region: N/A  Muscle:  Temple Region: severe wasting Clavicle Bone Region: severe wasting Clavicle and Acromion Bone Region: severe wasting Scapular Bone Region: N/A Dorsal Hand: N/A Patellar Region: N/A Anterior Thigh Region: N/A Posterior Calf Region: N/A  Edema: none  Patient meets criteria for severe malnutrition in the context of chronic illness as evidenced by severe muscle loss and 7% weight loss in < 2  months.  Height: Ht Readings from Last 1 Encounters:  09/03/13 5\' 9"  (1.753 m)    Weight: Wt Readings from Last 1 Encounters:  09/04/13 149 lb 7.6 oz (67.8 kg)    Ideal Body Weight: 160 lb  % Ideal Body Weight: 93%  Wt Readings from Last 10 Encounters:  09/04/13 149 lb 7.6 oz (67.8 kg)  08/20/13 162 lb (73.483 kg)  07/21/13 160 lb 0.9 oz (72.6 kg)  07/21/13 160 lb 0.9 oz (72.6 kg)  05/26/13 155 lb (70.308 kg)  05/26/13 155 lb (70.308 kg)  05/15/13 155 lb (70.308 kg)  05/09/13 155 lb (70.308 kg)  04/30/13 152 lb (68.947 kg)  03/04/13 136 lb 3.9 oz (61.8 kg)    Usual Body Weight: 160 lb  % Usual Body Weight: 93%  BMI:  Body mass index is 22.06 kg/(m^2).  Estimated Nutritional Needs: Kcal: 1700-1900 Protein: 80-90 gm Fluid: 1.7-1.9 L  Skin: Intact  Diet Order: NPO  EDUCATION NEEDS: -No education needs identified at this time   Intake/Output Summary (Last 24 hours) at 09/04/13 1159 Last data filed at 09/04/13 0700  Gross per 24 hour  Intake    500 ml  Output      0 ml  Net    500 ml    Labs:   Recent Labs Lab 09/03/13 1645 09/03/13 1702 09/03/13 2054 09/04/13 0344  NA 143 141  --  142  K 4.1 3.9  --  3.8  CL 107 103  --  108  CO2 26  --   --  25  BUN 14 12  --  14  CREATININE 0.91 1.00 0.81 0.85  CALCIUM 8.6  --   --  8.1*  GLUCOSE 81 82  --  87    Scheduled Meds: . aspirin EC  81 mg Oral QHS  . cefoTAXime (CLAFORAN) IV  2 g Intravenous 3 times per day  . finasteride  5 mg Oral QHS  . furosemide  40 mg Oral Daily  . heparin  5,000 Units Subcutaneous 3 times per day  . lactulose  30 g Oral TID  . levothyroxine  100 mcg Oral QAC breakfast  . metoprolol tartrate  12.5 mg Oral BID  . mirtazapine  30 mg Oral QHS  . pantoprazole  40 mg Oral QHS  . rifaximin  550 mg Oral TID  . senna-docusate  2 tablet Oral Daily  . sodium chloride  3 mL Intravenous Q12H  . spironolactone  50 mg Oral Daily  . vancomycin  1,250 mg Intravenous Q24H  .  vitamin B-12  1,000 mcg Oral Daily    Continuous Infusions: . sodium chloride 50 mL/hr at 09/03/13 2304    Past Medical History  Diagnosis Date  . Arthritis     Shoulders and knees  . Hyperlipidemia   . Hypertension   . A-fib     Dr Wynonia Lawman; WAS on Coumadin until 04/2012  . Angina     uses NTG prn  . Pneumonia     12 /2012.  HC assoc pneumonia/aspiration pneumonia 2015  . Hypothyroidism     x 10 yrs  . GERD (gastroesophageal reflux disease)   . Constipation, chronic   . Depression   . History of dizziness   . Anxiety   . Urinary frequency   . Hearing loss of both ears     wears hearing aides  . BPH with obstruction/lower urinary tract symptoms     sees Dr Gaynelle Arabian  . Compression fracture of thoracic vertebra, non-traumatic 06/14/12    T6 and T7 (noted on CT angio chest to r/o PE)  . Alcoholic cirrhosis of liver with ascites 06/14/12    Noted on CT angio chest to r/o PE  . Esophageal varices in alcoholic cirrhosis "   "    "   "    "  . Intertrochanteric fracture of left hip 05/24/2013  . HCAP (healthcare-associated pneumonia) 07/13/2013  . Postoperative anemia due to acute blood loss 05/27/2013    Past Surgical History  Procedure Laterality Date  . Total knee arthroplasty  1994    right  . Total shoulder replacement  1997    right  . Joint replacement      Rt knee and shoulder  . Hernia repair  1990    LIH repair  . Cholecystectomy  2004  . Inguinal hernia repair  05/09/2011    Procedure: HERNIA REPAIR INGUINAL ADULT;  Surgeon: Odis Hollingshead, MD;  Location: [facility];  Service: General;  Laterality: Right;  . Femur im nail Left 05/25/2013    Procedure: INTRAMEDULLARY (IM) NAIL FEMORAL;  Surgeon: Johnny Bridge, MD;  Location: [facility];  Service: Orthopedics;  Laterality: Left;  . Femoral hernia repair Right 07/13/2013    Procedure: REPAIR RIGHT INCARCERATED FEMORAL HERNIA WITH MESH;  Surgeon: Stark Klein, MD;  Location: [facility];  Service: General;  Laterality: Right;  .  Laparotomy N/A 07/13/2013    Procedure: EXPLORATORY LAPAROTOMY;  Surgeon: Stark Klein, MD;  Location: [facility];  Service: General;  Laterality: N/A;  . Bowel resection N/A 07/13/2013    Procedure: SMALL BOWEL RESECTION;  Surgeon: Stark Klein, MD;  Location: [place];  Service:  General;  Laterality: N/A;    Arthur Holms, RD, LDN Pager #: [phone] After-Hours Pager #: [phone]

## 2013-09-04 NOTE — Procedures (Signed)
Successful US guided paracentesis from RLQ.  Yielded 2.1L of clear yellow fluid.  No immediate complications.  Pt tolerated well.   Specimen was sent for labs.  Ascencion Dike PA-C 09/04/2013 2:55 PM

## 2013-09-04 NOTE — Progress Notes (Signed)
PROGRESS NOTE  Raymond Balke Sr. NUU:[id number] DOB: [birth year] DOA: 09/03/2013 PCP: Tammi Sou, MD  Assessment/Plan: Encephalopathy:  -Suspect hepatic source given cirrhosis and elevated ammonia level.  -lactulose and rifaximin.  -vancomycin and cefotaxime for ? Infection- SBP- wound does not appear to be infected   Wound infection: seen by general surgery; no further treatment from their standpoint  Cirrhosis/ascites: Suspect progression of cirrhotic disease. Recent surgical procedure may have exacerbated this.  IR for paracentesis- diagnostic to r/o SBP -Continue home spironolactone and Lasix regimen. TED hose.  -elevated alkaline phosphatase in the setting of cirrhotic disease. Check GGT. Continue to follow.   HTN: Continue beta blocker    Code Status: DNR Family Communication: called daughter's cell, no answer Disposition Plan: from ALF   Consultants:  surgery  Procedures:      HPI/Subjective: More oriented, asking for cell phone and glasses- asked me to call daughter to see if she had cell phone  Objective: Filed Vitals:   09/04/13 0430  BP: 127/69  Pulse: 74  Temp: 97.6 F (36.4 C)  Resp: 18    Intake/Output Summary (Last 24 hours) at 09/04/13 0903 Last data filed at 09/04/13 0700  Gross per 24 hour  Intake    500 ml  Output      0 ml  Net    500 ml   Filed Weights   09/03/13 2100 09/03/13 2227 09/04/13 0500  Weight: 73.5 kg (162 lb 0.6 oz) 67.858 kg (149 lb 9.6 oz) 67.8 kg (149 lb 7.6 oz)    Exam:  General: pleasant/coopeartive- oriented to person  Heart: S1, S2 normal, no murmur, rub or gallop, regular rate and rhythm  Lungs: clear to auscultation, no wheezes or rales and unlabored breathing  Abdomen: abdomen is soft but distended, + erythema and drainage of clear fluid in R inguinal area  Extremities: 2+ peripheral pulses, 2-3 + pitting edema bilaterally  Skin:no rashes, no ecchymoses  Neurology: normal without focal findings       Data Reviewed: Basic Metabolic Panel:  Recent Labs Lab 09/03/13 1645 09/03/13 1702 09/03/13 2054 09/04/13 0344  NA 143 141  --  142  K 4.1 3.9  --  3.8  CL 107 103  --  108  CO2 26  --   --  25  GLUCOSE 81 82  --  87  BUN 14 12  --  14  CREATININE 0.91 1.00 0.81 0.85  CALCIUM 8.6  --   --  8.1*   Liver Function Tests:  Recent Labs Lab 09/03/13 1645 09/04/13 0344  AST 36 32  ALT 14 12  ALKPHOS 169* 143*  BILITOT 1.4* 0.9  PROT 5.7* 4.9*  ALBUMIN 2.1* 1.8*   No results found for this basename: LIPASE, AMYLASE,  in the last 168 hours  Recent Labs Lab 09/03/13 1645  AMMONIA 86*   CBC:  Recent Labs Lab 09/03/13 1645 09/03/13 1702 09/03/13 2054 09/04/13 0344  WBC 7.6  --  7.1 6.2  NEUTROABS  --   --   --  3.2  HGB 10.8* 11.6* 9.9* 9.5*  HCT 32.3* 34.0* 28.6* 28.2*  MCV 93.4  --  92.9 93.4  PLT 167  --  146* 140*   Cardiac Enzymes: No results found for this basename: CKTOTAL, CKMB, CKMBINDEX, TROPONINI,  in the last 168 hours BNP (last 3 results) No results found for this basename: PROBNP,  in the last 8760 hours CBG: No results found for this basename: GLUCAP,  in the last  168 hours  Recent Results (from the past 240 hour(s))  CULTURE, BLOOD (ROUTINE X 2)     Status: None   Collection Time    09/03/13  4:35 PM      Result Value Ref Range Status   Specimen Description BLOOD ARM RIGHT   Final   Special Requests     Final   Value: BOTTLES DRAWN AEROBIC AND ANAEROBIC 10CCBLUE 5CCRED   Culture  Setup Time     Final   Value: 09/03/2013 20:39     Performed at [facility]   Culture     Final   Value:        BLOOD CULTURE RECEIVED NO GROWTH TO DATE CULTURE WILL BE HELD FOR 5 DAYS BEFORE ISSUING A FINAL NEGATIVE REPORT     Performed at [facility]   Report Status PENDING   Incomplete  CULTURE, BLOOD (ROUTINE X 2)     Status: None   Collection Time    09/03/13  4:42 PM      Result Value Ref Range Status   Specimen Description  BLOOD HAND RIGHT   Final   Special Requests BOTTLES DRAWN AEROBIC ONLY 10CC   Final   Culture  Setup Time     Final   Value: 09/03/2013 20:39     Performed at [facility]   Culture     Final   Value:        BLOOD CULTURE RECEIVED NO GROWTH TO DATE CULTURE WILL BE HELD FOR 5 DAYS BEFORE ISSUING A FINAL NEGATIVE REPORT     Performed at [facility]   Report Status PENDING   Incomplete  MRSA PCR SCREENING     Status: None   Collection Time    09/03/13 10:42 PM      Result Value Ref Range Status   MRSA by PCR NEGATIVE  NEGATIVE Final   Comment:            The GeneXpert MRSA Assay (FDA     approved for NASAL specimens     only), is one component of a     comprehensive MRSA colonization     surveillance program. It is not     intended to diagnose MRSA     infection nor to guide or     monitor treatment for     MRSA infections.     Studies: Ct Abdomen Pelvis W Contrast  09/03/2013   CLINICAL DATA:  Drainage from hernia repair site, confusion  EXAM: CT ABDOMEN AND PELVIS WITH CONTRAST  TECHNIQUE: Multidetector CT imaging of the abdomen and pelvis was performed using the standard protocol following bolus administration of intravenous contrast.  CONTRAST:  138mL OMNIPAQUE IOHEXOL 300 MG/ML  SOLN  COMPARISON:  07/13/2013, 03/09/2012  FINDINGS: There is a large amount of ascites. Hepatic contour is nodular which may be seen with cirrhosis. Spleen is at upper limits of normal in size without focal abnormality. Right renal cortical cysts are reidentified. No radiopaque renal or ureteral calculus. Left kidney, adrenal glands, and pancreas are unremarkable. Cholecystectomy clips are noted. Mild prominence of the common bile duct is identified measuring 1 cm at its mid portion image 28.  Gas is present within the soft tissues over the right inguinal region for example image 87, with a small amount of fluid subjacent to this area. Foci of gas in the visualized within the right inguinal  region subcutaneous fat. There is apparent laxity of the right inguinal  region with fluid bulging into the right inguinal canal image 78. No evidence for bowel entrapment or obstruction. The patient is status post left mid abdominal bowel anastomosis. Splenorenal vascular collaterals are noted. Rightward scoliosis of the thoracolumbar spine is noted with extensive multilevel disc degenerative change.  IMPRESSION: Large amount of ascites with nodular hepatic contour and splenorenal collaterals suggesting cirrhosis.  Some fluid is noted tracking into the region of the right inguinal canal which may indicate laxity. Foci of gas and trace fluid are identified within the right inguinal hernia repair site without evidence of bowel entrapment or obstruction, which could indicate communication with the skin surface, or fluid within the wound representing seroma or abscess, or communication with the abdominal cavity.  These results were called by telephone at the time of interpretation on 09/03/2013 at 5:57 PM to [facility] , who verbally acknowledged these results.   Electronically Signed   By: Conchita Paris M.D.   On: 09/03/2013 18:01   Dg Chest Port 1 View  09/03/2013   CLINICAL DATA:  Abdominal pain last 4 days  EXAM: PORTABLE CHEST - 1 VIEW  COMPARISON:  07/13/2013  FINDINGS: Cardiomediastinal silhouette is stable. Right shoulder prosthesis again noted. No acute infiltrate or pleural effusion. No pulmonary edema.  IMPRESSION: No active disease.   Electronically Signed   By: Lahoma Crocker M.D.   On: 09/03/2013 17:09    Scheduled Meds: . aspirin EC  81 mg Oral QHS  . cefoTAXime (CLAFORAN) IV  2 g Intravenous 3 times per day  . finasteride  5 mg Oral QHS  . furosemide  40 mg Oral Daily  . heparin  5,000 Units Subcutaneous 3 times per day  . lactulose  30 g Oral TID  . levothyroxine  100 mcg Oral QAC breakfast  . metoprolol tartrate  12.5 mg Oral BID  . mirtazapine  30 mg Oral QHS  . pantoprazole  40 mg  Oral QHS  . rifaximin  550 mg Oral TID  . senna-docusate  2 tablet Oral Daily  . sodium chloride  3 mL Intravenous Q12H  . spironolactone  50 mg Oral Daily  . vancomycin  1,250 mg Intravenous Q24H  . vitamin B-12  1,000 mcg Oral Daily   Continuous Infusions: . sodium chloride 50 mL/hr at 09/03/13 2304   Antibiotics Given (last 72 hours)   Date/Time Action Medication Dose Rate   09/03/13 2304 Given   cefoTAXime (CLAFORAN) 2 g in dextrose 5 % 50 mL IVPB 2 g 100 mL/hr   09/03/13 2358 Given   vancomycin (VANCOCIN) 1,250 mg in sodium chloride 0.9 % 250 mL IVPB 1,250 mg 166.7 mL/hr   09/04/13 0111 Given   rifaximin (XIFAXAN) tablet 550 mg 550 mg    09/04/13 0534 Given   cefoTAXime (CLAFORAN) 2 g in dextrose 5 % 50 mL IVPB 2 g 100 mL/hr      Active Problems:   Encephalopathy    Time spent: 35 min    [facility] Pager [phone] If 7PM-7AM, please contact night-coverage at [email or web address], password [facility] 09/04/2013, 9:03 AM  LOS: 1 day

## 2013-09-04 NOTE — Progress Notes (Signed)
Clinical Social Work Department BRIEF PSYCHOSOCIAL ASSESSMENT 09/04/2013  Patient:  Raymond Larsen, Raymond Larsen     Account Number:  [id number]     Admit date:  09/03/2013  Clinical Social Worker:  Freeman Caldron  Date/Time:  09/04/2013 03:27 PM  Referred by:  Physician  Date Referred:  09/04/2013 Referred for  ALF Placement  SNF Placement   Other Referral:   Interview type:  Family Other interview type:    PSYCHOSOCIAL DATA Living Status:  FACILITY Admitted from facility:  Other Level of care:  Assisted Living Primary support name:  [name] [phone]) Primary support relationship to patient:  CHILD, ADULT Degree of support available:   Good--pt has support from adult children.    CURRENT CONCERNS Current Concerns  Post-Acute Placement   Other Concerns:    SOCIAL WORK ASSESSMENT / PLAN CSW called pt's daughter and left voicemail for [employer]. CSW received call back and [name] explained this is pt's 3rd hospitalization since November. He has been at [facility] in [place], [place] since January. He was recently at [facility] for rehab. CSW explained role and that CSW will follow to determine what level of care is recommended when pt is discharged.   Assessment/plan status:  Psychosocial Support/Ongoing Assessment of Needs Other assessment/ plan:   Information/referral to community resources:   ALF? SNF?    PATIENT'S/FAMILY'S RESPONSE TO PLAN OF CARE: Good--pt's daughter understanding of CSW role and engaged in conversation with CSW. CSW provided emotional support as daughter described stresses of pt being in and out of the hospital/SNF/ALF.       [name], MSW, [facility] Clinical Social Worker [phone]

## 2013-09-04 NOTE — Progress Notes (Signed)
Subjective: Patient know to [place] from elap/sbr/strangulated right femoral hernia repair about 7 weeks ago, readmission with encephalopathy and worse liver disease.  He is better this am, he states has been eating, having bms, he was seen by Dr Barry Dienes several weeks ago and noted to have a small wound infection at the medial aspect of wound that had decompressed (? Stitch abscess).  This is not really draining. We were asked to see him to make sure no infection related to surgery  Objective: Vital signs in last 24 hours: Temp:  [97.1 F (36.2 C)-98.2 F (36.8 C)] 97.6 F (36.4 C) (05/14 0430) Pulse Rate:  [74-85] 74 (05/14 0430) Resp:  [14-21] 18 (05/14 0430) BP: (94-130)/(52-69) 127/69 mmHg (05/14 0430) SpO2:  [96 %-98 %] 98 % (05/14 0430) Weight:  [149 lb 7.6 oz (67.8 kg)-162 lb 0.6 oz (73.5 kg)] 149 lb 7.6 oz (67.8 kg) (05/14 0500) Last BM Date: 09/02/13  Intake/Output from previous day: 05/13 0701 - 05/14 0700 In: 500 [I.V.:500] Out: -  Intake/Output this shift:    General appearance: no distress GI: soft nontender well healed low midline incision bs present Right groin wound healed except for medial aspect which has a 3 mm opening that is packed, there is no infection  Lab Results:   Recent Labs  09/03/13 2054 09/04/13 0344  WBC 7.1 6.2  HGB 9.9* 9.5*  HCT 28.6* 28.2*  PLT 146* 140*   BMET  Recent Labs  09/03/13 1645 09/03/13 1702 09/03/13 2054 09/04/13 0344  NA 143 141  --  142  K 4.1 3.9  --  3.8  CL 107 103  --  108  CO2 26  --   --  25  GLUCOSE 81 82  --  87  BUN 14 12  --  14  CREATININE 0.91 1.00 0.81 0.85  CALCIUM 8.6  --   --  8.1*   PT/INR  Recent Labs  09/03/13 2120  LABPROT 19.7*  INR 1.72*   ABG No results found for this basename: PHART, PCO2, PO2, HCO3,  in the last 72 hours  Studies/Results: Ct Abdomen Pelvis W Contrast  09/03/2013   CLINICAL DATA:  Drainage from hernia repair site, confusion  EXAM: CT ABDOMEN AND PELVIS WITH  CONTRAST  TECHNIQUE: Multidetector CT imaging of the abdomen and pelvis was performed using the standard protocol following bolus administration of intravenous contrast.  CONTRAST:  174mL OMNIPAQUE IOHEXOL 300 MG/ML  SOLN  COMPARISON:  07/13/2013, 03/09/2012  FINDINGS: There is a large amount of ascites. Hepatic contour is nodular which may be seen with cirrhosis. Spleen is at upper limits of normal in size without focal abnormality. Right renal cortical cysts are reidentified. No radiopaque renal or ureteral calculus. Left kidney, adrenal glands, and pancreas are unremarkable. Cholecystectomy clips are noted. Mild prominence of the common bile duct is identified measuring 1 cm at its mid portion image 28.  Gas is present within the soft tissues over the right inguinal region for example image 87, with a small amount of fluid subjacent to this area. Foci of gas in the visualized within the right inguinal region subcutaneous fat. There is apparent laxity of the right inguinal region with fluid bulging into the right inguinal canal image 78. No evidence for bowel entrapment or obstruction. The patient is status post left mid abdominal bowel anastomosis. Splenorenal vascular collaterals are noted. Rightward scoliosis of the thoracolumbar spine is noted with extensive multilevel disc degenerative change.  IMPRESSION: Large amount of ascites  with nodular hepatic contour and splenorenal collaterals suggesting cirrhosis.  Some fluid is noted tracking into the region of the right inguinal canal which may indicate laxity. Foci of gas and trace fluid are identified within the right inguinal hernia repair site without evidence of bowel entrapment or obstruction, which could indicate communication with the skin surface, or fluid within the wound representing seroma or abscess, or communication with the abdominal cavity.  These results were called by telephone at the time of interpretation on 09/03/2013 at 5:57 PM to [facility] , who verbally acknowledged these results.   Electronically Signed   By: Conchita Paris M.D.   On: 09/03/2013 18:01   Dg Chest Port 1 View  09/03/2013   CLINICAL DATA:  Abdominal pain last 4 days  EXAM: PORTABLE CHEST - 1 VIEW  COMPARISON:  07/13/2013  FINDINGS: Cardiomediastinal silhouette is stable. Right shoulder prosthesis again noted. No acute infiltrate or pleural effusion. No pulmonary edema.  IMPRESSION: No active disease.   Electronically Signed   By: Lahoma Crocker M.D.   On: 09/03/2013 17:09    Anti-infectives: Anti-infectives   Start     Dose/Rate Route Frequency Ordered Stop   09/03/13 2300  rifaximin (XIFAXAN) tablet 550 mg     550 mg Oral 3 times daily 09/03/13 2110     09/03/13 2200  cefoTAXime (CLAFORAN) 2 g in dextrose 5 % 50 mL IVPB     2 g 100 mL/hr over 30 Minutes Intravenous 3 times per day 09/03/13 2100     09/03/13 2200  vancomycin (VANCOCIN) 1,250 mg in sodium chloride 0.9 % 250 mL IVPB     1,250 mg 166.7 mL/hr over 90 Minutes Intravenous Every 24 hours 09/03/13 2137     09/03/13 2100  levofloxacin (LEVAQUIN) IVPB 500 mg  Status:  Discontinued     500 mg 100 mL/hr over 60 Minutes Intravenous Every 24 hours 09/03/13 2057 09/03/13 2059      Assessment/Plan: S/p elap/sbr/right groin hernia repair 1. I do not think he has infection just slowly healing wound.  Would continue dressing changes 2. Can return to see Dr Barry Dienes a couple weeks after discharge. Please call back if needed  Rolm Bookbinder 09/04/2013

## 2013-09-04 NOTE — Progress Notes (Signed)
[facility]  Patient Status: Active (receiving services up to time of hospitalization)  [facility] is providing the following services: RN, PT and OT  If patient discharges after hours, please call [phone].   Pearletha Forge 09/04/2013, 4:27 PM

## 2013-09-05 ENCOUNTER — Encounter (INDEPENDENT_AMBULATORY_CARE_PROVIDER_SITE_OTHER): Payer: Medicare PPO | Admitting: General Surgery

## 2013-09-05 DIAGNOSIS — T8130XA Disruption of wound, unspecified, initial encounter: Secondary | ICD-10-CM

## 2013-09-05 DIAGNOSIS — G9340 Encephalopathy, unspecified: Secondary | ICD-10-CM

## 2013-09-05 DIAGNOSIS — K7030 Alcoholic cirrhosis of liver without ascites: Secondary | ICD-10-CM

## 2013-09-05 DIAGNOSIS — I10 Essential (primary) hypertension: Secondary | ICD-10-CM

## 2013-09-05 DIAGNOSIS — R188 Other ascites: Secondary | ICD-10-CM

## 2013-09-05 LAB — COMPREHENSIVE METABOLIC PANEL
ALK PHOS: 138 U/L — AB (ref 39–117)
ALT: 11 U/L (ref 0–53)
AST: 31 U/L (ref 0–37)
Albumin: 1.6 g/dL — ABNORMAL LOW (ref 3.5–5.2)
BUN: 12 mg/dL (ref 6–23)
CO2: 24 meq/L (ref 19–32)
Calcium: 8.2 mg/dL — ABNORMAL LOW (ref 8.4–10.5)
Chloride: 107 mEq/L (ref 96–112)
Creatinine, Ser: 0.86 mg/dL (ref 0.50–1.35)
GFR, EST AFRICAN AMERICAN: 87 mL/min — AB (ref >90)
GFR, EST NON AFRICAN AMERICAN: 75 mL/min — AB (ref >90)
GLUCOSE: 82 mg/dL (ref 70–99)
POTASSIUM: 3.9 meq/L (ref 3.7–5.3)
Sodium: 139 mEq/L (ref 137–147)
Total Bilirubin: 0.6 mg/dL (ref 0.3–1.2)
Total Protein: 4.6 g/dL — ABNORMAL LOW (ref 6.0–8.3)

## 2013-09-05 LAB — CBC WITH DIFFERENTIAL/PLATELET
BASOS ABS: 0.1 10*3/uL (ref 0.0–0.1)
BASOS PCT: 1 % (ref 0–1)
Eosinophils Absolute: 0.9 10*3/uL — ABNORMAL HIGH (ref 0.0–0.7)
Eosinophils Relative: 14 % — ABNORMAL HIGH (ref 0–5)
HCT: 27.3 % — ABNORMAL LOW (ref 39.0–52.0)
Hemoglobin: 9.1 g/dL — ABNORMAL LOW (ref 13.0–17.0)
Lymphocytes Relative: 30 % (ref 12–46)
Lymphs Abs: 1.9 10*3/uL (ref 0.7–4.0)
MCH: 31.1 pg (ref 26.0–34.0)
MCHC: 33.3 g/dL (ref 30.0–36.0)
MCV: 93.2 fL (ref 78.0–100.0)
Monocytes Absolute: 0.5 10*3/uL (ref 0.1–1.0)
Monocytes Relative: 8 % (ref 3–12)
NEUTROS PCT: 47 % (ref 43–77)
Neutro Abs: 3 10*3/uL (ref 1.7–7.7)
PLATELETS: 146 10*3/uL — AB (ref 150–400)
RBC: 2.93 MIL/uL — ABNORMAL LOW (ref 4.22–5.81)
RDW: 14.8 % (ref 11.5–15.5)
WBC: 6.4 10*3/uL (ref 4.0–10.5)

## 2013-09-05 LAB — CULTURE, BLOOD (ROUTINE X 2)

## 2013-09-05 LAB — PATHOLOGIST SMEAR REVIEW

## 2013-09-05 NOTE — Progress Notes (Signed)
Talked with pt's daughter Rolan Bucco. Daughter states pt may need long-term SNF placement. CSW made referrals to SNFs, and daughter states [facility] would be a top choice. CSW will follow up with family once bed offers are made.   [name], MSW, [facility] Clinical Social Worker [phone]

## 2013-09-05 NOTE — Progress Notes (Signed)
[facility] PROGRESS NOTE  Raymond Shrestha Sr. [id number] DOB: Jul 17, 1922 DOA: 09/03/2013 PCP: Tammi Sou, MD  Assessment/Plan: 1. Acute Mental Status Changes with encephalopathy -appears to be clearing up now -continue with lactulose and rifaxamin -monitor ammonia levels  2. Wound Infection -surgery has seen the patient and cleared the wound -does have Gr +cocci in blood cultures await ID of the cultures  3. Cirrhosis/Ascites -had paracentesis done removed 2.1L fluid -cultures negative on fluid -continue with lactulose  4. Hypertension -right now is controlled  5. Gr + cocci in Blood Culture -will continue with vancomycin for now until ID is performed -one bottle was positive  Code Status: DNR Family Communication: No family  (indicate person spoken with, relationship, and if by phone, the number) Disposition Plan: from ALF   Consultants:  General Surgery  Procedures:  paracentesis  Antibiotics: -Vancocin -Rifaximin -Cefotaxime  HPI/Subjective: He is awake and somewhat cooperative. No specific complaints noted this morning.  Objective: Filed Vitals:   09/05/13 0452  BP: 114/54  Pulse: 80  Temp: 97.3 F (36.3 C)  Resp:     Intake/Output Summary (Last 24 hours) at 09/05/13 0930 Last data filed at 09/04/13 1909  Gross per 24 hour  Intake    270 ml  Output   1000 ml  Net   -730 ml   Filed Weights   09/03/13 2227 09/04/13 0500 09/05/13 0500  Weight: 67.858 kg (149 lb 9.6 oz) 67.8 kg (149 lb 7.6 oz) 68.811 kg (151 lb 11.2 oz)    Exam:   General:  Comfortable no distress not fully oriented  Cardiovascular: RRR no gallop noted S1S2 normal  Respiratory: Good aeration no rales or ronchi  Abdomen: soft non-tender  Musculoskeletal: no active synovitis  Data Reviewed: Basic Metabolic Panel:  Recent Labs Lab 09/03/13 1645 09/03/13 1702 09/03/13 2054 09/04/13 0344 09/05/13 0416  NA 143 141  --  142 139  K 4.1 3.9  --  3.8 3.9   CL 107 103  --  108 107  CO2 26  --   --  25 24  GLUCOSE 81 82  --  87 82  BUN 14 12  --  14 12  CREATININE 0.91 1.00 0.81 0.85 0.86  CALCIUM 8.6  --   --  8.1* 8.2*   Liver Function Tests:  Recent Labs Lab 09/03/13 1645 09/04/13 0344 09/05/13 0416  AST 36 32 31  ALT 14 12 11   ALKPHOS 169* 143* 138*  BILITOT 1.4* 0.9 0.6  PROT 5.7* 4.9* 4.6*  ALBUMIN 2.1* 1.8* 1.6*   No results found for this basename: LIPASE, AMYLASE,  in the last 168 hours  Recent Labs Lab 09/03/13 1645  AMMONIA 86*   CBC:  Recent Labs Lab 09/03/13 1645 09/03/13 1702 09/03/13 2054 09/04/13 0344 09/05/13 0416  WBC 7.6  --  7.1 6.2 6.4  NEUTROABS  --   --   --  3.2 3.0  HGB 10.8* 11.6* 9.9* 9.5* 9.1*  HCT 32.3* 34.0* 28.6* 28.2* 27.3*  MCV 93.4  --  92.9 93.4 93.2  PLT 167  --  146* 140* 146*   Cardiac Enzymes: No results found for this basename: CKTOTAL, CKMB, CKMBINDEX, TROPONINI,  in the last 168 hours BNP (last 3 results) No results found for this basename: PROBNP,  in the last 8760 hours CBG: No results found for this basename: GLUCAP,  in the last 168 hours  Recent Results (from the past 240 hour(s))  CULTURE, BLOOD (ROUTINE X 2)  Status: None   Collection Time    09/03/13  4:35 PM      Result Value Ref Range Status   Specimen Description BLOOD ARM RIGHT   Final   Special Requests     Final   Value: BOTTLES DRAWN AEROBIC AND ANAEROBIC 10CCBLUE 5CCRED   Culture  Setup Time     Final   Value: 09/03/2013 20:39     Performed at [facility]   Culture     Final   Value:        BLOOD CULTURE RECEIVED NO GROWTH TO DATE CULTURE WILL BE HELD FOR 5 DAYS BEFORE ISSUING A FINAL NEGATIVE REPORT     Performed at [facility]   Report Status PENDING   Incomplete  CULTURE, BLOOD (ROUTINE X 2)     Status: None   Collection Time    09/03/13  4:42 PM      Result Value Ref Range Status   Specimen Description BLOOD HAND RIGHT   Final   Special Requests BOTTLES DRAWN AEROBIC  ONLY 10CC   Final   Culture  Setup Time     Final   Value: 09/03/2013 20:39     Performed at [facility]   Culture     Final   Value: GRAM POSITIVE COCCI IN CLUSTERS     Note: Gram Stain Report Called to,Read Back By and Verified With: ERICA S@2 :30PM ON 09/04/13 BY DANTS     Performed at [facility]   Report Status PENDING   Incomplete  URINE CULTURE     Status: None   Collection Time    09/03/13  5:01 PM      Result Value Ref Range Status   Specimen Description URINE, CLEAN CATCH   Final   Special Requests NONE   Final   Culture  Setup Time     Final   Value: 09/03/2013 17:27     Performed at [facility]     Final   Value: >=100,000 COLONIES/ML     Performed at [facility]   Culture     Final   Value: Multiple bacterial morphotypes present, none predominant. Suggest appropriate recollection if clinically indicated.     Performed at [facility]   Report Status 09/04/2013 FINAL   Final  MRSA PCR SCREENING     Status: None   Collection Time    09/03/13 10:42 PM      Result Value Ref Range Status   MRSA by PCR NEGATIVE  NEGATIVE Final   Comment:            The GeneXpert MRSA Assay (FDA     approved for NASAL specimens     only), is one component of a     comprehensive MRSA colonization     surveillance program. It is not     intended to diagnose MRSA     infection nor to guide or     monitor treatment for     MRSA infections.  BODY FLUID CULTURE     Status: None   Collection Time    09/04/13  2:35 PM      Result Value Ref Range Status   Specimen Description FLUID ABDOMEN ASCITIC   Final   Special Requests NONE   Final   Gram Stain     Final   Value: NO WBC SEEN     NO ORGANISMS SEEN  Performed at [facility]     Final   Value: NO GROWTH 1 DAY     Performed at [facility]   Report Status PENDING   Incomplete     Studies: Ct Abdomen Pelvis W Contrast  09/03/2013   CLINICAL  DATA:  Drainage from hernia repair site, confusion  EXAM: CT ABDOMEN AND PELVIS WITH CONTRAST  TECHNIQUE: Multidetector CT imaging of the abdomen and pelvis was performed using the standard protocol following bolus administration of intravenous contrast.  CONTRAST:  142mL OMNIPAQUE IOHEXOL 300 MG/ML  SOLN  COMPARISON:  07/13/2013, 03/09/2012  FINDINGS: There is a large amount of ascites. Hepatic contour is nodular which may be seen with cirrhosis. Spleen is at upper limits of normal in size without focal abnormality. Right renal cortical cysts are reidentified. No radiopaque renal or ureteral calculus. Left kidney, adrenal glands, and pancreas are unremarkable. Cholecystectomy clips are noted. Mild prominence of the common bile duct is identified measuring 1 cm at its mid portion image 28.  Gas is present within the soft tissues over the right inguinal region for example image 87, with a small amount of fluid subjacent to this area. Foci of gas in the visualized within the right inguinal region subcutaneous fat. There is apparent laxity of the right inguinal region with fluid bulging into the right inguinal canal image 78. No evidence for bowel entrapment or obstruction. The patient is status post left mid abdominal bowel anastomosis. Splenorenal vascular collaterals are noted. Rightward scoliosis of the thoracolumbar spine is noted with extensive multilevel disc degenerative change.  IMPRESSION: Large amount of ascites with nodular hepatic contour and splenorenal collaterals suggesting cirrhosis.  Some fluid is noted tracking into the region of the right inguinal canal which may indicate laxity. Foci of gas and trace fluid are identified within the right inguinal hernia repair site without evidence of bowel entrapment or obstruction, which could indicate communication with the skin surface, or fluid within the wound representing seroma or abscess, or communication with the abdominal cavity.  These results were  called by telephone at the time of interpretation on 09/03/2013 at 5:57 PM to [facility] , who verbally acknowledged these results.   Electronically Signed   By: Conchita Paris M.D.   On: 09/03/2013 18:01   US Paracentesis  09/04/2013   CLINICAL DATA:  Ascites. Request diagnostic and therapeutic paracentesis.  EXAM: ULTRASOUND GUIDED PARACENTESIS  COMPARISON:  None.  PROCEDURE: An ultrasound guided paracentesis was thoroughly discussed with the patient and questions answered. The benefits, risks, alternatives and complications were also discussed. The patient understands and wishes to proceed with the procedure. Written consent was obtained.  Ultrasound was performed to localize and mark an adequate pocket of fluid in the right lower quadrant of the abdomen. The area was then prepped and draped in the normal sterile fashion. 1% Lidocaine was used for local anesthesia. Under ultrasound guidance a 19 gauge Yueh catheter was introduced. Paracentesis was performed. The catheter was removed and a dressing applied.  COMPLICATIONS: None immediate  FINDINGS: A total of approximately 2.1 L of clear yellow fluid was removed. A fluid sample was sent for laboratory analysis.  IMPRESSION: Successful ultrasound guided paracentesis yielding 2.1 L Of ascites.  Read by Ascencion Dike PA-C   Electronically Signed   By: Arne Cleveland M.D.   On: 09/04/2013 15:44   Dg Chest Port 1 View  09/03/2013   CLINICAL DATA:  Abdominal pain last 4 days  EXAM:  PORTABLE CHEST - 1 VIEW  COMPARISON:  07/13/2013  FINDINGS: Cardiomediastinal silhouette is stable. Right shoulder prosthesis again noted. No acute infiltrate or pleural effusion. No pulmonary edema.  IMPRESSION: No active disease.   Electronically Signed   By: Lahoma Crocker M.D.   On: 09/03/2013 17:09    Scheduled Meds: . aspirin EC  81 mg Oral QHS  . cefoTAXime (CLAFORAN) IV  2 g Intravenous 3 times per day  . finasteride  5 mg Oral QHS  . furosemide  40 mg Oral Daily  .  heparin  5,000 Units Subcutaneous 3 times per day  . lactulose  30 g Oral TID  . levothyroxine  100 mcg Oral QAC breakfast  . metoprolol tartrate  12.5 mg Oral BID  . mirtazapine  30 mg Oral QHS  . pantoprazole  40 mg Oral QHS  . rifaximin  550 mg Oral TID  . senna-docusate  2 tablet Oral Daily  . sodium chloride  3 mL Intravenous Q12H  . spironolactone  50 mg Oral Daily  . vancomycin  1,250 mg Intravenous Q24H  . vitamin B-12  1,000 mcg Oral Daily   Continuous Infusions: . sodium chloride 50 mL/hr at 09/03/13 2304    Active Problems:   Encephalopathy    Time spent: 24min    Raymond Larsen A Jaxton Casale  [facility] Pager [phone]. If 7PM-7AM, please contact night-coverage at [email or web address], password [facility] 09/05/2013, 9:30 AM  LOS: 2 days

## 2013-09-05 NOTE — Clinical Documentation Improvement (Signed)
  Per 5/14 eval by Registered Dietician and PMH "meets criteria for Severe protein calorie malnutrition in the context of chronic illness as evidenced by severe muscle loss and 7% weight loss in < 2 months". If you agree please add to documentation to reflect severity of illness and risk of mortality. Thank you.  Possible Clinical Conditions? - Severe Protein Calorie Malnutrition - Other Condition  Supporting Information: - [place] Region: severe wasting  - Clavicle Bone Region: severe wasting  - Clavicle and Acromion Bone Region: severe wasting  Thank You, Ezekiel Ina ,RN Clinical Documentation Specialist:  [phone]  [facility] Information Management

## 2013-09-05 NOTE — Evaluation (Signed)
Physical Therapy Evaluation Patient Details Name: Raymond Hoselton Sr. MRN: [id number] DOB: [birth year] Today's Date: 09/05/2013   History of Present Illness  Admit with encephalopathy.  Paracentesis right LQ.  Had surgery 07/13/13 for incarcerated hernia.   Clinical Impression  Pt admitted with above. Pt currently with functional limitations due to the deficits listed below (see PT Problem List). Will need skilled PT at NH prior to return to A living.   Pt will benefit from skilled PT to increase their independence and safety with mobility to allow discharge to the venue listed below.      Follow Up Recommendations SNF;Supervision/Assistance - 24 hour    Equipment Recommendations  Other (comment) (TBA)    Recommendations for Other Services       Precautions / Restrictions Precautions Precautions: Fall Restrictions Weight Bearing Restrictions: No      Mobility  Bed Mobility Overal bed mobility: Needs Assistance Bed Mobility: Supine to Sit     Supine to sit: Mod assist     General bed mobility comments: Needed assist to bring LEs off bed and for elevation of trunk.    Transfers Overall transfer level: Needs assistance   Transfers: Sit to/from Stand;Stand Pivot Transfers Sit to Stand: Mod assist Stand pivot transfers: Min assist       General transfer comment: Able to take pivotal steps to chair with UE support.  Pt maintained flexed posture throughout however.   Ambulation/Gait                Stairs            Wheelchair Mobility    Modified Rankin (Stroke Patients Only)       Balance Overall balance assessment: Needs assistance;History of Falls Sitting-balance support: Bilateral upper extremity supported;Feet supported Sitting balance-Leahy Scale: Good     Standing balance support: Bilateral upper extremity supported;During functional activity Standing balance-Leahy Scale: Poor Standing balance comment: needs support for standing. Stated he used  wheelchair to get up at times.                               Pertinent Vitals/Pain VSS, no pain    Home Living Family/patient expects to be discharged to:: Assisted living               Home Equipment: Walker - 2 wheels;Walker - 4 wheels;Cane - single point;Bedside commode;Shower seat;Electric scooter;Wheelchair - manual      Prior Function Level of Independence: Needs assistance   Gait / Transfers Assistance Needed: used a rollator or wheelchair per pt  ADL's / Homemaking Assistance Needed: Sometimes could do himself per pt  Comments: Wife and pt live in two separate rooms.  Pt states he only needs help with meds. Pt has meals in dining room.       Hand Dominance   Dominant Hand: Right    Extremity/Trunk Assessment   Upper Extremity Assessment: Defer to OT evaluation           Lower Extremity Assessment: Generalized weakness         Communication   Communication: HOH  Cognition Arousal/Alertness: Awake/alert Behavior During Therapy: Anxious Overall Cognitive Status: History of cognitive impairments - at baseline       Memory: Decreased short-term memory              General Comments      Exercises General Exercises - Lower Extremity Ankle Circles/Pumps: AROM;Both;10 reps;Seated Long Arc Quad:  AROM;Both;10 reps;Seated Hip Flexion/Marching: AROM;Both;10 reps;Seated      Assessment/Plan    PT Assessment Patient needs continued PT services  PT Diagnosis Generalized weakness   PT Problem List Decreased activity tolerance;Decreased balance;Decreased mobility;Decreased knowledge of use of DME;Decreased safety awareness;Decreased knowledge of precautions  PT Treatment Interventions DME instruction;Gait training;Functional mobility training;Therapeutic activities;Therapeutic exercise;Balance training;Patient/family education   PT Goals (Current goals can be found in the Care Plan section) Acute Rehab PT Goals Patient Stated Goal: to  get therapy and then return to A living PT Goal Formulation: With patient Time For Goal Achievement: 09/12/13 Potential to Achieve Goals: Good    Frequency Min 3X/week   Barriers to discharge Decreased caregiver support      Co-evaluation               End of Session Equipment Utilized During Treatment: Gait belt Activity Tolerance: Patient limited by fatigue Patient left: in chair;with call bell/phone within reach;with family/visitor present Nurse Communication: Mobility status         Time: 1049-1100 PT Time Calculation (min): 11 min   Charges:   PT Evaluation $Initial PT Evaluation Tier I: 1 Procedure PT Treatments $Therapeutic Activity: 8-22 mins   PT G Codes:          Christianne Dolin 10/04/13, 11:37 AM [facility] [phone] (pager)

## 2013-09-05 NOTE — Progress Notes (Signed)
ANTIBIOTIC CONSULT NOTE   Pharmacy Consult for vancomycin Indication: Cellulitis  Allergies  Allergen Reactions  . Amitriptyline Other (See Comments)    Dizziness   . Beta Adrenergic Blockers Other (See Comments)    lethargy  . Imdur [Isosorbide]     Headache   . Niacin And Related Rash       . Sulfa Antibiotics Itching, Nausea And Vomiting and Rash    Patient Measurements: Height: 5\' 9"  (175.3 cm) Weight: 151 lb 11.2 oz (68.811 kg) IBW/kg (Calculated) : 70.7 Adjusted Body Weight: 73.5 kg  Vital Signs: Temp: 97.3 F (36.3 C) (05/15 0452) Temp src: Oral (05/15 0452) BP: 114/54 mmHg (05/15 0452) Pulse Rate: 80 (05/15 0452) Intake/Output from previous day: 05/14 0701 - 05/15 0700 In: 270 [P.O.:120; IV Piggyback:150] Out: 1000 [Urine:1000] Intake/Output from this shift:    Labs:  Recent Labs  09/03/13 2054 09/04/13 0344 09/05/13 0416  WBC 7.1 6.2 6.4  HGB 9.9* 9.5* 9.1*  PLT 146* 140* 146*  CREATININE 0.81 0.85 0.86   Estimated Creatinine Clearance: 57.8 ml/min (by C-G formula based on Cr of 0.86). No results found for this basename: VANCOTROUGH, VANCOPEAK, VANCORANDOM, GENTTROUGH, GENTPEAK, GENTRANDOM, TOBRATROUGH, TOBRAPEAK, TOBRARND, AMIKACINPEAK, AMIKACINTROU, AMIKACIN,  in the last 72 hours   Microbiology: Recent Results (from the past 720 hour(s))  CULTURE, BLOOD (ROUTINE X 2)     Status: None   Collection Time    09/03/13  4:35 PM      Result Value Ref Range Status   Specimen Description BLOOD ARM RIGHT   Final   Special Requests     Final   Value: BOTTLES DRAWN AEROBIC AND ANAEROBIC 10CCBLUE 5CCRED   Culture  Setup Time     Final   Value: 09/03/2013 20:39     Performed at [facility]   Culture     Final   Value:        BLOOD CULTURE RECEIVED NO GROWTH TO DATE CULTURE WILL BE HELD FOR 5 DAYS BEFORE ISSUING A FINAL NEGATIVE REPORT     Performed at [facility]   Report Status PENDING   Incomplete  CULTURE, BLOOD (ROUTINE X 2)      Status: None   Collection Time    09/03/13  4:42 PM      Result Value Ref Range Status   Specimen Description BLOOD HAND RIGHT   Final   Special Requests BOTTLES DRAWN AEROBIC ONLY 10CC   Final   Culture  Setup Time     Final   Value: 09/03/2013 20:39     Performed at [facility]   Culture     Final   Value: STAPHYLOCOCCUS SPECIES (COAGULASE NEGATIVE)     Note: THE SIGNIFICANCE OF ISOLATING THIS ORGANISM FROM A SINGLE SET OF BLOOD CULTURES WHEN MULTIPLE SETS ARE DRAWN IS UNCERTAIN. PLEASE NOTIFY THE MICROBIOLOGY DEPARTMENT WITHIN ONE WEEK IF SPECIATION AND SENSITIVITIES ARE REQUIRED.     Note: Gram Stain Report Called to,Read Back By and Verified With: ERICA S@2 :30PM ON 09/04/13 BY DANTS     Performed at [facility]   Report Status 09/05/2013 FINAL   Final  URINE CULTURE     Status: None   Collection Time    09/03/13  5:01 PM      Result Value Ref Range Status   Specimen Description URINE, CLEAN CATCH   Final   Special Requests NONE   Final   Culture  Setup Time     Final  Value: 09/03/2013 17:27     Performed at [place]     Final   Value: >=100,000 COLONIES/ML     Performed at [facility]   Culture     Final   Value: Multiple bacterial morphotypes present, none predominant. Suggest appropriate recollection if clinically indicated.     Performed at [facility]   Report Status 09/04/2013 FINAL   Final  MRSA PCR SCREENING     Status: None   Collection Time    09/03/13 10:42 PM      Result Value Ref Range Status   MRSA by PCR NEGATIVE  NEGATIVE Final   Comment:            The GeneXpert MRSA Assay (FDA     approved for NASAL specimens     only), is one component of a     comprehensive MRSA colonization     surveillance program. It is not     intended to diagnose MRSA     infection nor to guide or     monitor treatment for     MRSA infections.  BODY FLUID CULTURE     Status: None   Collection Time    09/04/13   2:35 PM      Result Value Ref Range Status   Specimen Description FLUID ABDOMEN ASCITIC   Final   Special Requests NONE   Final   Gram Stain     Final   Value: NO WBC SEEN     NO ORGANISMS SEEN     Performed at [facility]   Culture     Final   Value: NO GROWTH 1 DAY     Performed at [facility]   Report Status PENDING   Incomplete    Medical History: Past Medical History  Diagnosis Date  . Arthritis     Shoulders and knees  . Hyperlipidemia   . Hypertension   . A-fib     Dr Wynonia Lawman; WAS on Coumadin until 04/2012  . Angina     uses NTG prn  . Pneumonia     12 /2012.  HC assoc pneumonia/aspiration pneumonia 2015  . Hypothyroidism     x 10 yrs  . GERD (gastroesophageal reflux disease)   . Constipation, chronic   . Depression   . History of dizziness   . Anxiety   . Urinary frequency   . Hearing loss of both ears     wears hearing aides  . BPH with obstruction/lower urinary tract symptoms     sees Dr Gaynelle Arabian  . Compression fracture of thoracic vertebra, non-traumatic 06/14/12    T6 and T7 (noted on CT angio chest to r/o PE)  . Alcoholic cirrhosis of liver with ascites 06/14/12    Noted on CT angio chest to r/o PE  . Esophageal varices in alcoholic cirrhosis "   "    "   "    "  . Intertrochanteric fracture of left hip 05/24/2013  . HCAP (healthcare-associated pneumonia) 07/13/2013  . Postoperative anemia due to acute blood loss 05/27/2013    Assessment: 74 YOM with hx of Alcoholic cirrhosis, s/p recent inguinal hernia repair in March, presented with encephalopathy, wound infection, and possible UTI. Pharmacy is consulted to start vancomycin on 5/13 for cellulitis. Renal function normal, no fevers, wbc 6. Currently on day #3 of cefotaxime and vancomycin. Blood cx have now resulted 1/2 positive for Coag negative staph  which is generally considered a contaminant.   5/13 1/2 blood - CoNS 5/13 urine - mult bacteria 5/14 wound -abd fluid - ngtd  Goal of  Therapy:  Vancomycin trough level 10-15 mcg/ml  Plan:  - Continue Vancomycin 1250 mg IV Q 24 hrs - f/u renal function and cultures - Vancomycin trough at steady state if therapy continues  Erin Hearing PharmD., BCPS Clinical Pharmacist Pager [phone] 09/05/2013 11:02 AM

## 2013-09-05 NOTE — Progress Notes (Signed)
Clinical Social Work Department CLINICAL SOCIAL WORK PLACEMENT NOTE 09/05/2013  Patient:  Raymond Larsen, Raymond Larsen  Account Number:  [id number] Admit date:  09/03/2013  Clinical Social Worker:  [name], Latanya Presser  Date/time:  09/05/2013 03:01 PM  Clinical Social Work is seeking post-discharge placement for this patient at the following level of care:   SKILLED NURSING   (*CSW will update this form in Epic as items are completed)   N/A-clincials sent to all SNF in [employer] provided with [place] Department of Clinical Social Work's list of facilities offering this level of care within the geographic area requested by the patient (or if unable, by the patient's family).  09/05/2013  Patient/family informed of their freedom to choose among providers that offer the needed level of care, that participate in Medicare, Medicaid or managed care program needed by the patient, have an available bed and are willing to accept the patient.  N/A-not sent to this facility  Patient/family informed of [facility]' ownership interest in [facility], as well as of the fact that they are under no obligation to receive care at this facility.  PASARR submitted to EDS on 09/05/2013 PASARR number received from EDS on 09/05/2013  FL2 transmitted to all facilities in geographic area requested by pt/family on  09/05/2013 FL2 transmitted to all facilities within larger geographic area on   Patient informed that his/her managed care company has contracts with or will negotiate with  certain facilities, including the following:     Patient/family informed of bed offers received:   Patient chooses bed at  Physician recommends and patient chooses bed at    Patient to be transferred to  on   Patient to be transferred to facility by   The following physician request were entered in Epic:   Additional Comments:   [name], MSW, [place] Social Worker [phone]

## 2013-09-06 DIAGNOSIS — G9340 Encephalopathy, unspecified: Secondary | ICD-10-CM

## 2013-09-06 DIAGNOSIS — K7030 Alcoholic cirrhosis of liver without ascites: Secondary | ICD-10-CM

## 2013-09-06 DIAGNOSIS — R0989 Other specified symptoms and signs involving the circulatory and respiratory systems: Secondary | ICD-10-CM

## 2013-09-06 DIAGNOSIS — I10 Essential (primary) hypertension: Secondary | ICD-10-CM

## 2013-09-06 DIAGNOSIS — T8130XA Disruption of wound, unspecified, initial encounter: Secondary | ICD-10-CM

## 2013-09-06 LAB — COMPREHENSIVE METABOLIC PANEL
ALBUMIN: 1.6 g/dL — AB (ref 3.5–5.2)
ALT: 13 U/L (ref 0–53)
AST: 36 U/L (ref 0–37)
Alkaline Phosphatase: 165 U/L — ABNORMAL HIGH (ref 39–117)
BUN: 12 mg/dL (ref 6–23)
CO2: 22 mEq/L (ref 19–32)
Calcium: 8.3 mg/dL — ABNORMAL LOW (ref 8.4–10.5)
Chloride: 107 mEq/L (ref 96–112)
Creatinine, Ser: 0.85 mg/dL (ref 0.50–1.35)
GFR calc Af Amer: 88 mL/min — ABNORMAL LOW (ref >90)
GFR calc non Af Amer: 76 mL/min — ABNORMAL LOW (ref >90)
Glucose, Bld: 119 mg/dL — ABNORMAL HIGH (ref 70–99)
POTASSIUM: 3.9 meq/L (ref 3.7–5.3)
SODIUM: 139 meq/L (ref 137–147)
TOTAL PROTEIN: 4.7 g/dL — AB (ref 6.0–8.3)
Total Bilirubin: 0.5 mg/dL (ref 0.3–1.2)

## 2013-09-06 LAB — AMMONIA: Ammonia: 50 umol/L (ref 11–60)

## 2013-09-06 LAB — CBC WITH DIFFERENTIAL/PLATELET
BASOS ABS: 0.1 10*3/uL (ref 0.0–0.1)
Basophils Relative: 1 % (ref 0–1)
EOS PCT: 13 % — AB (ref 0–5)
Eosinophils Absolute: 0.8 10*3/uL — ABNORMAL HIGH (ref 0.0–0.7)
HEMATOCRIT: 29.1 % — AB (ref 39.0–52.0)
Hemoglobin: 9.9 g/dL — ABNORMAL LOW (ref 13.0–17.0)
LYMPHS PCT: 28 % (ref 12–46)
Lymphs Abs: 1.8 10*3/uL (ref 0.7–4.0)
MCH: 31.9 pg (ref 26.0–34.0)
MCHC: 34 g/dL (ref 30.0–36.0)
MCV: 93.9 fL (ref 78.0–100.0)
MONO ABS: 0.6 10*3/uL (ref 0.1–1.0)
MONOS PCT: 10 % (ref 3–12)
Neutro Abs: 3.1 10*3/uL (ref 1.7–7.7)
Neutrophils Relative %: 48 % (ref 43–77)
Platelets: 164 10*3/uL (ref 150–400)
RBC: 3.1 MIL/uL — ABNORMAL LOW (ref 4.22–5.81)
RDW: 14.9 % (ref 11.5–15.5)
WBC: 6.3 10*3/uL (ref 4.0–10.5)

## 2013-09-06 NOTE — Progress Notes (Signed)
[facility] PROGRESS NOTE  Raymond Stemm Sr. NKN:[id number] DOB: March 27, 1923 DOA: 09/03/2013 PCP: Tammi Sou, MD  Assessment/Plan: 1. Acute Mental Status Changes with encephalopathy -continues to improve though not oriented fully yet-not sure what his baseline is -family wants an MRI done-will ask neuro to see patient first -repeat ammonia level  2. Wound Infection -surgery has seen the patient and cleared the wound -Gr +cocci -->Staph Epi likely contamination stopped abx  3. Cirrhosis/Ascites -had paracentesis done removed 2.1L fluid -cultures negative on fluid  4. Hypertension -right now is controlled   Code Status: DNR Family Communication: No family  (indicate person spoken with, relationship, and if by phone, the number) Disposition Plan: from ALF   Consultants:  General Surgery  Procedures:  paracentesis  Antibiotics: -Vancocin -Rifaximin -Cefotaxime  HPI/Subjective: He is awake and somewhat cooperative. No specific complaints noted this morning.  Objective: Filed Vitals:   09/06/13 0500  BP: 102/62  Pulse: 79  Temp: 98.2 F (36.8 C)  Resp: 17    Intake/Output Summary (Last 24 hours) at 09/06/13 1232 Last data filed at 09/05/13 1900  Gross per 24 hour  Intake    480 ml  Output    600 ml  Net   -120 ml   Filed Weights   09/04/13 0500 09/05/13 0500 09/06/13 0500  Weight: 67.8 kg (149 lb 7.6 oz) 68.811 kg (151 lb 11.2 oz) 68.6 kg (151 lb 3.8 oz)    Exam:   General:  Comfortable no distress not fully oriented  Cardiovascular: RRR no gallop noted S1S2 normal  Respiratory: Good aeration no rales or ronchi  Abdomen: soft non-tender  Musculoskeletal: no active synovitis  Data Reviewed: Basic Metabolic Panel:  Recent Labs Lab 09/03/13 1645 09/03/13 1702 09/03/13 2054 09/04/13 0344 09/05/13 0416 09/06/13 0345  NA 143 141  --  142 139 139  K 4.1 3.9  --  3.8 3.9 3.9  CL 107 103  --  108 107 107  CO2 26  --   --  25 24 22    GLUCOSE 81 82  --  87 82 119*  BUN 14 12  --  14 12 12   CREATININE 0.91 1.00 0.81 0.85 0.86 0.85  CALCIUM 8.6  --   --  8.1* 8.2* 8.3*   Liver Function Tests:  Recent Labs Lab 09/03/13 1645 09/04/13 0344 09/05/13 0416 09/06/13 0345  AST 36 32 31 36  ALT 14 12 11 13   ALKPHOS 169* 143* 138* 165*  BILITOT 1.4* 0.9 0.6 0.5  PROT 5.7* 4.9* 4.6* 4.7*  ALBUMIN 2.1* 1.8* 1.6* 1.6*   No results found for this basename: LIPASE, AMYLASE,  in the last 168 hours  Recent Labs Lab 09/03/13 1645 09/06/13 1118  AMMONIA 86* 50   CBC:  Recent Labs Lab 09/03/13 1645 09/03/13 1702 09/03/13 2054 09/04/13 0344 09/05/13 0416 09/06/13 0345  WBC 7.6  --  7.1 6.2 6.4 6.3  NEUTROABS  --   --   --  3.2 3.0 3.1  HGB 10.8* 11.6* 9.9* 9.5* 9.1* 9.9*  HCT 32.3* 34.0* 28.6* 28.2* 27.3* 29.1*  MCV 93.4  --  92.9 93.4 93.2 93.9  PLT 167  --  146* 140* 146* 164   Cardiac Enzymes: No results found for this basename: CKTOTAL, CKMB, CKMBINDEX, TROPONINI,  in the last 168 hours BNP (last 3 results) No results found for this basename: PROBNP,  in the last 8760 hours CBG: No results found for this basename: GLUCAP,  in the last 168 hours  Recent Results (from the past 240 hour(s))  CULTURE, BLOOD (ROUTINE X 2)     Status: None   Collection Time    09/03/13  4:35 PM      Result Value Ref Range Status   Specimen Description BLOOD ARM RIGHT   Final   Special Requests     Final   Value: BOTTLES DRAWN AEROBIC AND ANAEROBIC 10CCBLUE 5CCRED   Culture  Setup Time     Final   Value: 09/03/2013 20:39     Performed at [facility]   Culture     Final   Value:        BLOOD CULTURE RECEIVED NO GROWTH TO DATE CULTURE WILL BE HELD FOR 5 DAYS BEFORE ISSUING A FINAL NEGATIVE REPORT     Performed at [facility]   Report Status PENDING   Incomplete  CULTURE, BLOOD (ROUTINE X 2)     Status: None   Collection Time    09/03/13  4:42 PM      Result Value Ref Range Status   Specimen  Description BLOOD HAND RIGHT   Final   Special Requests BOTTLES DRAWN AEROBIC ONLY 10CC   Final   Culture  Setup Time     Final   Value: 09/03/2013 20:39     Performed at [facility]   Culture     Final   Value: STAPHYLOCOCCUS SPECIES (COAGULASE NEGATIVE)     Note: THE SIGNIFICANCE OF ISOLATING THIS ORGANISM FROM A SINGLE SET OF BLOOD CULTURES WHEN MULTIPLE SETS ARE DRAWN IS UNCERTAIN. PLEASE NOTIFY THE MICROBIOLOGY DEPARTMENT WITHIN ONE WEEK IF SPECIATION AND SENSITIVITIES ARE REQUIRED.     Note: Gram Stain Report Called to,Read Back By and Verified With: ERICA S@2 :30PM ON 09/04/13 BY DANTS     Performed at [facility]   Report Status 09/05/2013 FINAL   Final  URINE CULTURE     Status: None   Collection Time    09/03/13  5:01 PM      Result Value Ref Range Status   Specimen Description URINE, CLEAN CATCH   Final   Special Requests NONE   Final   Culture  Setup Time     Final   Value: 09/03/2013 17:27     Performed at [place]     Final   Value: >=100,000 COLONIES/ML     Performed at [facility]   Culture     Final   Value: Multiple bacterial morphotypes present, none predominant. Suggest appropriate recollection if clinically indicated.     Performed at [facility]   Report Status 09/04/2013 FINAL   Final  MRSA PCR SCREENING     Status: None   Collection Time    09/03/13 10:42 PM      Result Value Ref Range Status   MRSA by PCR NEGATIVE  NEGATIVE Final   Comment:            The GeneXpert MRSA Assay (FDA     approved for NASAL specimens     only), is one component of a     comprehensive MRSA colonization     surveillance program. It is not     intended to diagnose MRSA     infection nor to guide or     monitor treatment for     MRSA infections.  BODY FLUID CULTURE     Status: None   Collection Time    09/04/13  2:35 PM      Result Value Ref Range Status   Specimen Description FLUID ABDOMEN ASCITIC   Final    Special Requests NONE   Final   Gram Stain     Final   Value: NO WBC SEEN     NO ORGANISMS SEEN     Performed at [facility]   Culture     Final   Value: NO GROWTH 1 DAY     Performed at [facility]   Report Status PENDING   Incomplete     Studies: US Paracentesis  09/04/2013   CLINICAL DATA:  Ascites. Request diagnostic and therapeutic paracentesis.  EXAM: ULTRASOUND GUIDED PARACENTESIS  COMPARISON:  None.  PROCEDURE: An ultrasound guided paracentesis was thoroughly discussed with the patient and questions answered. The benefits, risks, alternatives and complications were also discussed. The patient understands and wishes to proceed with the procedure. Written consent was obtained.  Ultrasound was performed to localize and mark an adequate pocket of fluid in the right lower quadrant of the abdomen. The area was then prepped and draped in the normal sterile fashion. 1% Lidocaine was used for local anesthesia. Under ultrasound guidance a 19 gauge Yueh catheter was introduced. Paracentesis was performed. The catheter was removed and a dressing applied.  COMPLICATIONS: None immediate  FINDINGS: A total of approximately 2.1 L of clear yellow fluid was removed. A fluid sample was sent for laboratory analysis.  IMPRESSION: Successful ultrasound guided paracentesis yielding 2.1 L Of ascites.  Read by Ascencion Dike PA-C   Electronically Signed   By: Arne Cleveland M.D.   On: 09/04/2013 15:44    Scheduled Meds: . aspirin EC  81 mg Oral QHS  . cefoTAXime (CLAFORAN) IV  2 g Intravenous 3 times per day  . finasteride  5 mg Oral QHS  . furosemide  40 mg Oral Daily  . heparin  5,000 Units Subcutaneous 3 times per day  . lactulose  30 g Oral TID  . levothyroxine  100 mcg Oral QAC breakfast  . metoprolol tartrate  12.5 mg Oral BID  . mirtazapine  30 mg Oral QHS  . pantoprazole  40 mg Oral QHS  . rifaximin  550 mg Oral TID  . senna-docusate  2 tablet Oral Daily  . sodium chloride  3 mL  Intravenous Q12H  . spironolactone  50 mg Oral Daily  . vitamin B-12  1,000 mcg Oral Daily   Continuous Infusions: . sodium chloride 50 mL/hr at 09/05/13 2029    Active Problems:   Encephalopathy    Time spent: 55min    Raymond Larsen A Cleon Signorelli  [facility] Pager [phone]. If 7PM-7AM, please contact night-coverage at [email or web address], password [facility] 09/06/2013, 12:32 PM  LOS: 3 days

## 2013-09-07 ENCOUNTER — Inpatient Hospital Stay (HOSPITAL_COMMUNITY): Payer: Medicare HMO

## 2013-09-07 DIAGNOSIS — K7030 Alcoholic cirrhosis of liver without ascites: Secondary | ICD-10-CM

## 2013-09-07 DIAGNOSIS — G9340 Encephalopathy, unspecified: Secondary | ICD-10-CM

## 2013-09-07 DIAGNOSIS — T8130XA Disruption of wound, unspecified, initial encounter: Secondary | ICD-10-CM

## 2013-09-07 DIAGNOSIS — R188 Other ascites: Secondary | ICD-10-CM

## 2013-09-07 DIAGNOSIS — I10 Essential (primary) hypertension: Secondary | ICD-10-CM

## 2013-09-07 LAB — BODY FLUID CULTURE
Culture: NO GROWTH
Gram Stain: NONE SEEN

## 2013-09-07 LAB — CBC WITH DIFFERENTIAL/PLATELET
BASOS ABS: 0.1 10*3/uL (ref 0.0–0.1)
Basophils Relative: 1 % (ref 0–1)
Eosinophils Absolute: 1 10*3/uL — ABNORMAL HIGH (ref 0.0–0.7)
Eosinophils Relative: 12 % — ABNORMAL HIGH (ref 0–5)
HCT: 31.8 % — ABNORMAL LOW (ref 39.0–52.0)
Hemoglobin: 10.7 g/dL — ABNORMAL LOW (ref 13.0–17.0)
LYMPHS ABS: 2.9 10*3/uL (ref 0.7–4.0)
LYMPHS PCT: 34 % (ref 12–46)
MCH: 31.7 pg (ref 26.0–34.0)
MCHC: 33.6 g/dL (ref 30.0–36.0)
MCV: 94.1 fL (ref 78.0–100.0)
Monocytes Absolute: 0.8 10*3/uL (ref 0.1–1.0)
Monocytes Relative: 9 % (ref 3–12)
Neutro Abs: 3.8 10*3/uL (ref 1.7–7.7)
Neutrophils Relative %: 44 % (ref 43–77)
PLATELETS: 180 10*3/uL (ref 150–400)
RBC: 3.38 MIL/uL — ABNORMAL LOW (ref 4.22–5.81)
RDW: 14.9 % (ref 11.5–15.5)
WBC: 8.5 10*3/uL (ref 4.0–10.5)

## 2013-09-07 LAB — COMPREHENSIVE METABOLIC PANEL
ALBUMIN: 1.7 g/dL — AB (ref 3.5–5.2)
ALT: 14 U/L (ref 0–53)
AST: 38 U/L — ABNORMAL HIGH (ref 0–37)
Alkaline Phosphatase: 161 U/L — ABNORMAL HIGH (ref 39–117)
BILIRUBIN TOTAL: 0.8 mg/dL (ref 0.3–1.2)
BUN: 11 mg/dL (ref 6–23)
CHLORIDE: 105 meq/L (ref 96–112)
CO2: 22 meq/L (ref 19–32)
Calcium: 8.2 mg/dL — ABNORMAL LOW (ref 8.4–10.5)
Creatinine, Ser: 0.87 mg/dL (ref 0.50–1.35)
GFR calc Af Amer: 87 mL/min — ABNORMAL LOW (ref >90)
GFR, EST NON AFRICAN AMERICAN: 75 mL/min — AB (ref >90)
GLUCOSE: 85 mg/dL (ref 70–99)
Potassium: 4 mEq/L (ref 3.7–5.3)
Sodium: 137 mEq/L (ref 137–147)
Total Protein: 4.9 g/dL — ABNORMAL LOW (ref 6.0–8.3)

## 2013-09-07 LAB — AMMONIA: AMMONIA: 43 umol/L (ref 11–60)

## 2013-09-07 NOTE — Progress Notes (Signed)
[facility] PROGRESS NOTE  Raymond Gondek Sr. EQA:[id number] DOB: November 26, 1922 DOA: 09/03/2013 PCP: Tammi Sou, MD  Assessment/Plan: 1. Acute Mental Status Changes with encephalopathy -He is still not entirely cleared up. Ammonia level improved to 43 -will stop ;actulose due to diarrhea -I am going to get a CT of the head today -consider Neuro consult  2. Wound Infection -no active infection per surgical evaluation -will monitor  3. Cirrhosis/Ascites -had paracentesis done removed 2.1L fluid -cultures negative on fluid  4. Hypertension -right now is controlled   Code Status: DNR Family Communication: No family  (indicate person spoken with, relationship, and if by phone, the number) Disposition Plan: from ALF   Consultants:  General Surgery  Procedures:  paracentesis  Antibiotics: -Vancocin-stopped -Rifaximin-started 5/13 -Cefotaxime-started 5/13  HPI/Subjective: He is awake and appears to be oriented to person not place this morning. Seems to be a little more improved overall.  Objective: Filed Vitals:   09/07/13 0602  BP: 107/51  Pulse: 72  Temp: 98.6 F (37 C)  Resp: 16    Intake/Output Summary (Last 24 hours) at 09/07/13 0946 Last data filed at 09/07/13 0600  Gross per 24 hour  Intake    550 ml  Output    300 ml  Net    250 ml   Filed Weights   09/05/13 0500 09/06/13 0500 09/07/13 0602  Weight: 68.811 kg (151 lb 11.2 oz) 68.6 kg (151 lb 3.8 oz) 68.9 kg (151 lb 14.4 oz)    Exam:   General:  Comfortable no distress not fully oriented  Cardiovascular: RRR no gallop noted S1S2 normal  Respiratory: Good aeration no rales or ronchi  Abdomen: soft non-tender  Musculoskeletal: no active synovitis  Data Reviewed: Basic Metabolic Panel:  Recent Labs Lab 09/03/13 1645 09/03/13 1702 09/03/13 2054 09/04/13 0344 09/05/13 0416 09/06/13 0345 09/07/13 0802  NA 143 141  --  142 139 139 137  K 4.1 3.9  --  3.8 3.9 3.9 4.0  CL 107 103   --  108 107 107 105  CO2 26  --   --  25 24 22 22   GLUCOSE 81 82  --  87 82 119* 85  BUN 14 12  --  14 12 12 11   CREATININE 0.91 1.00 0.81 0.85 0.86 0.85 0.87  CALCIUM 8.6  --   --  8.1* 8.2* 8.3* 8.2*   Liver Function Tests:  Recent Labs Lab 09/03/13 1645 09/04/13 0344 09/05/13 0416 09/06/13 0345 09/07/13 0802  AST 36 32 31 36 38*  ALT 14 12 11 13 14   ALKPHOS 169* 143* 138* 165* 161*  BILITOT 1.4* 0.9 0.6 0.5 0.8  PROT 5.7* 4.9* 4.6* 4.7* 4.9*  ALBUMIN 2.1* 1.8* 1.6* 1.6* 1.7*   No results found for this basename: LIPASE, AMYLASE,  in the last 168 hours  Recent Labs Lab 09/03/13 1645 09/06/13 1118 09/07/13 0802  AMMONIA 86* 50 43   CBC:  Recent Labs Lab 09/03/13 2054 09/04/13 0344 09/05/13 0416 09/06/13 0345 09/07/13 0802  WBC 7.1 6.2 6.4 6.3 8.5  NEUTROABS  --  3.2 3.0 3.1 3.8  HGB 9.9* 9.5* 9.1* 9.9* 10.7*  HCT 28.6* 28.2* 27.3* 29.1* 31.8*  MCV 92.9 93.4 93.2 93.9 94.1  PLT 146* 140* 146* 164 180   Cardiac Enzymes: No results found for this basename: CKTOTAL, CKMB, CKMBINDEX, TROPONINI,  in the last 168 hours BNP (last 3 results) No results found for this basename: PROBNP,  in the last 8760 hours CBG: No  results found for this basename: GLUCAP,  in the last 168 hours  Recent Results (from the past 240 hour(s))  CULTURE, BLOOD (ROUTINE X 2)     Status: None   Collection Time    09/03/13  4:35 PM      Result Value Ref Range Status   Specimen Description BLOOD ARM RIGHT   Final   Special Requests     Final   Value: BOTTLES DRAWN AEROBIC AND ANAEROBIC 10CCBLUE 5CCRED   Culture  Setup Time     Final   Value: 09/03/2013 20:39     Performed at [facility]   Culture     Final   Value:        BLOOD CULTURE RECEIVED NO GROWTH TO DATE CULTURE WILL BE HELD FOR 5 DAYS BEFORE ISSUING A FINAL NEGATIVE REPORT     Performed at [facility]   Report Status PENDING   Incomplete  CULTURE, BLOOD (ROUTINE X 2)     Status: None   Collection Time     09/03/13  4:42 PM      Result Value Ref Range Status   Specimen Description BLOOD HAND RIGHT   Final   Special Requests BOTTLES DRAWN AEROBIC ONLY 10CC   Final   Culture  Setup Time     Final   Value: 09/03/2013 20:39     Performed at [facility]   Culture     Final   Value: STAPHYLOCOCCUS SPECIES (COAGULASE NEGATIVE)     Note: THE SIGNIFICANCE OF ISOLATING THIS ORGANISM FROM A SINGLE SET OF BLOOD CULTURES WHEN MULTIPLE SETS ARE DRAWN IS UNCERTAIN. PLEASE NOTIFY THE MICROBIOLOGY DEPARTMENT WITHIN ONE WEEK IF SPECIATION AND SENSITIVITIES ARE REQUIRED.     Note: Gram Stain Report Called to,Read Back By and Verified With: ERICA S@2 :30PM ON 09/04/13 BY DANTS     Performed at [facility]   Report Status 09/05/2013 FINAL   Final  URINE CULTURE     Status: None   Collection Time    09/03/13  5:01 PM      Result Value Ref Range Status   Specimen Description URINE, CLEAN CATCH   Final   Special Requests NONE   Final   Culture  Setup Time     Final   Value: 09/03/2013 17:27     Performed at [place]     Final   Value: >=100,000 COLONIES/ML     Performed at [facility]   Culture     Final   Value: Multiple bacterial morphotypes present, none predominant. Suggest appropriate recollection if clinically indicated.     Performed at [facility]   Report Status 09/04/2013 FINAL   Final  MRSA PCR SCREENING     Status: None   Collection Time    09/03/13 10:42 PM      Result Value Ref Range Status   MRSA by PCR NEGATIVE  NEGATIVE Final   Comment:            The GeneXpert MRSA Assay (FDA     approved for NASAL specimens     only), is one component of a     comprehensive MRSA colonization     surveillance program. It is not     intended to diagnose MRSA     infection nor to guide or     monitor treatment for     MRSA infections.  BODY FLUID CULTURE  Status: None   Collection Time    09/04/13  2:35 PM      Result Value Ref  Range Status   Specimen Description FLUID ABDOMEN ASCITIC   Final   Special Requests NONE   Final   Gram Stain     Final   Value: NO WBC SEEN     NO ORGANISMS SEEN     Performed at [facility]   Culture     Final   Value: NO GROWTH 2 DAYS     Performed at [facility]   Report Status PENDING   Incomplete     Studies: Ct Head Wo Contrast  09/07/2013   CLINICAL DATA:  Confusion  EXAM: CT HEAD WITHOUT CONTRAST  TECHNIQUE: Contiguous axial images were obtained from the base of the skull through the vertex without intravenous contrast.  COMPARISON:  None.  FINDINGS: There is moderate diffuse atrophy. There is no mass, hemorrhage, extra-axial fluid collection, or midline shift. There is small vessel disease throughout the centra semiovale bilaterally, slightly greater anteriorly than elsewhere. No acute appearing infarct is identified on this study. Bony calvarium appears intact. Mastoid air cells are clear. There is rightward deviation of the nasal septum.  IMPRESSION: Atrophy with fairly extensive supratentorial small vessel disease. No acute appearing infarct is identified. No hemorrhage or mass effect. There is rightward deviation of the nasal septum.   Electronically Signed   By: Lowella Grip M.D.   On: 09/07/2013 08:55    Scheduled Meds: . aspirin EC  81 mg Oral QHS  . cefoTAXime (CLAFORAN) IV  2 g Intravenous 3 times per day  . finasteride  5 mg Oral QHS  . furosemide  40 mg Oral Daily  . heparin  5,000 Units Subcutaneous 3 times per day  . lactulose  30 g Oral TID  . levothyroxine  100 mcg Oral QAC breakfast  . metoprolol tartrate  12.5 mg Oral BID  . mirtazapine  30 mg Oral QHS  . pantoprazole  40 mg Oral QHS  . rifaximin  550 mg Oral TID  . senna-docusate  2 tablet Oral Daily  . sodium chloride  3 mL Intravenous Q12H  . spironolactone  50 mg Oral Daily  . vitamin B-12  1,000 mcg Oral Daily   Continuous Infusions: . sodium chloride 50 mL/hr at 09/06/13  1902    Active Problems:   Encephalopathy    Time spent: 51min    Idabell Picking A Rieley Khalsa  [facility] Pager [phone]. If 7PM-7AM, please contact night-coverage at [email or web address], password [facility] 09/07/2013, 9:46 AM  LOS: 4 days

## 2013-09-08 DIAGNOSIS — I2510 Atherosclerotic heart disease of native coronary artery without angina pectoris: Secondary | ICD-10-CM

## 2013-09-08 LAB — COMPREHENSIVE METABOLIC PANEL
ALBUMIN: 1.5 g/dL — AB (ref 3.5–5.2)
ALT: 12 U/L (ref 0–53)
AST: 31 U/L (ref 0–37)
Alkaline Phosphatase: 151 U/L — ABNORMAL HIGH (ref 39–117)
BILIRUBIN TOTAL: 0.5 mg/dL (ref 0.3–1.2)
BUN: 12 mg/dL (ref 6–23)
CHLORIDE: 104 meq/L (ref 96–112)
CO2: 23 mEq/L (ref 19–32)
Calcium: 8.3 mg/dL — ABNORMAL LOW (ref 8.4–10.5)
Creatinine, Ser: 0.84 mg/dL (ref 0.50–1.35)
GFR calc Af Amer: 88 mL/min — ABNORMAL LOW (ref >90)
GFR, EST NON AFRICAN AMERICAN: 76 mL/min — AB (ref >90)
Glucose, Bld: 77 mg/dL (ref 70–99)
Potassium: 4.3 mEq/L (ref 3.7–5.3)
Sodium: 134 mEq/L — ABNORMAL LOW (ref 137–147)
Total Protein: 4.7 g/dL — ABNORMAL LOW (ref 6.0–8.3)

## 2013-09-08 LAB — CBC WITH DIFFERENTIAL/PLATELET
BASOS PCT: 1 % (ref 0–1)
Basophils Absolute: 0.1 10*3/uL (ref 0.0–0.1)
EOS ABS: 1.2 10*3/uL — AB (ref 0.0–0.7)
Eosinophils Relative: 13 % — ABNORMAL HIGH (ref 0–5)
HCT: 30 % — ABNORMAL LOW (ref 39.0–52.0)
HEMOGLOBIN: 10.2 g/dL — AB (ref 13.0–17.0)
Lymphocytes Relative: 29 % (ref 12–46)
Lymphs Abs: 2.6 10*3/uL (ref 0.7–4.0)
MCH: 31.5 pg (ref 26.0–34.0)
MCHC: 34 g/dL (ref 30.0–36.0)
MCV: 92.6 fL (ref 78.0–100.0)
Monocytes Absolute: 0.8 10*3/uL (ref 0.1–1.0)
Monocytes Relative: 9 % (ref 3–12)
NEUTROS PCT: 48 % (ref 43–77)
Neutro Abs: 4.4 10*3/uL (ref 1.7–7.7)
Platelets: 196 10*3/uL (ref 150–400)
RBC: 3.24 MIL/uL — ABNORMAL LOW (ref 4.22–5.81)
RDW: 14.8 % (ref 11.5–15.5)
WBC: 9.1 10*3/uL (ref 4.0–10.5)

## 2013-09-08 MED ORDER — HALOPERIDOL 1 MG PO TABS
1.0000 mg | ORAL_TABLET | Freq: Four times a day (QID) | ORAL | Status: DC | PRN
  Administered 2013-09-09 – 2013-09-11 (×5): 1 mg via ORAL
  Filled 2013-09-08 (×5): qty 1

## 2013-09-08 NOTE — Progress Notes (Addendum)
Physical Therapy Treatment Patient Details Name: Raymond Marston Sr. MRN: [id number] DOB: 1923-02-26 Today's Date: 09/08/2013    History of Present Illness Admit with encephalopathy.  Paracentesis right LQ.  Had surgery 07/13/13 for incarcerated hernia.     PT Comments    Pt admitted with above. Pt currently with functional limitations due to balance and endurance deficits. Called by Almyra Free, SW regarding pts d/c plan.  Pt will have to have 24 hour care at d/c.  He cannot consistently perform tranfers without assist safely.  So if A living can provide assist with transfers each time then he can go there with [facility].  If not he will need to go home with 24 hour care by family.  Pt had drainage in right lower quadrant - weeping and this PT notified nursing.  It was dripping frequently.  Pt will benefit from skilled PT to increase their independence and safety with mobility to allow discharge to the venue listed below.    Follow Up Recommendations  Supervision/Assistance - 24 hour;Home health PT (Must have 24 hour care either at home health or A living.)     Equipment Recommendations   (TBA)    Recommendations for Other Services       Precautions / Restrictions Precautions Precautions: Fall Restrictions Weight Bearing Restrictions: No    Mobility  Bed Mobility Overal bed mobility: Needs Assistance Bed Mobility: Supine to Sit     Supine to sit: Min guard     General bed mobility comments: Was a struggle for pt to bring LEs off bed and for elevation of trunk but he was able to do it with time.     Transfers Overall transfer level: Needs assistance Equipment used: None Transfers: Sit to/from [name] Sit to Stand: Supervision   Squat pivot transfers: Supervision     General transfer comment: Able to take pivotal steps to chair with UE support.  Was able to perform transfer on own x 3 x but was weaker each time.  On the fourth attempt pt had to have assist.  Pt needs  assist with transfers for safety.  He is very unsteady on his feet when transferring bed to recliner to bed.      Ambulation/Gait                 Stairs            Wheelchair Mobility    Modified Rankin (Stroke Patients Only)       Balance Overall balance assessment: Needs assistance;History of Falls Sitting-balance support: Bilateral upper extremity supported;Feet supported Sitting balance-Leahy Scale: Good     Standing balance support: Bilateral upper extremity supported;During functional activity Standing balance-Leahy Scale: Poor Standing balance comment: 'Needed bil UE support for standing.                      Cognition Arousal/Alertness: Awake/alert Behavior During Therapy: Anxious Overall Cognitive Status: History of cognitive impairments - at baseline       Memory: Decreased short-term memory              Exercises General Exercises - Lower Extremity Ankle Circles/Pumps: AROM;Both;10 reps;Seated Long Arc Quad: AROM;Both;10 reps;Seated    General Comments        Pertinent Vitals/Pain VSS, No pain    Home Living                      Prior Function  PT Goals (current goals can now be found in the care plan section) Progress towards PT goals: Progressing toward goals    Frequency  Min 3X/week    PT Plan Discharge plan needs to be updated    Co-evaluation             End of Session Equipment Utilized During Treatment: Gait belt Activity Tolerance: Patient limited by fatigue Patient left: in chair;with call bell/phone within reach     Time: 1441-1510 PT Time Calculation (min): 29 min  Charges:  $Therapeutic Activity: 23-37 mins                    G Codes:      Christianne Dolin 09-10-13, 4:19 PM [facility] [phone] (pager)

## 2013-09-08 NOTE — Progress Notes (Signed)
Informed by [facility] rep that as of 08/12/13, pt has exhausted SNF benefit. Pt needs to have 60-day wellness period out of a hospital/SNF before his SNF eligibility re-starts. Will inform pt's family.   [name], MSW, [facility] Clinical Social Worker [phone]

## 2013-09-08 NOTE — Progress Notes (Signed)
CSW informed pt's daughter Johnanna Schneiders that pt does not have SNF eligibility days--needs to be out of SNF/hospital for at least 60 days before his SNF days will re-start. CSW explained CSW sent clincials to [facility] to see if they can increase staff support/if they have any skilled services. Sherrell to speak with her sisters and call CSW back once they have a plan in place.   [name], MSW, [facility] Clinical Social Worker [phone]

## 2013-09-08 NOTE — ED Provider Notes (Signed)
I saw and evaluated the patient, reviewed the resident's note and I agree with the findings and plan.   EKG Interpretation None      Patient 32M, here with confusion, concern for SBP. Afebrile here, daughters state confusion, appropriate with me. Has surgical wound in R groin, well-appearing to me. CT with ascites, possible connection, Margarita Mail reviewed this with surgery.  No abdominal pain on exam. No fever, doubt SBP. Admitted to medicine.  Osvaldo Shipper, MD 09/08/13 [phone]

## 2013-09-08 NOTE — Progress Notes (Signed)
[facility] PROGRESS NOTE  Raymond Fedak Sr. JYN:[id number] DOB: 04-Jul-1922 DOA: 09/03/2013 PCP: Tammi Sou, MD  Assessment/Plan: Acute Mental Status Changes with encephalopathy -ammonia ok -oriented to person/place/time -CT scan of brain shows chronic small vessel and atrophy   Wound Infection -no active infection per surgical evaluation -will monitor  Cirrhosis/Ascites -had paracentesis done removed 2.1L fluid -cultures negative on fluid D/c abx  Hypertension -right now is controlled  Suspect chronic dementia  Code Status: DNR Family Communication: spoke with daughter Disposition Plan: from ALF   Consultants:  General Surgery  Procedures:  paracentesis  Antibiotics: -Vancocin-stopped -Rifaximin-started 5/13 -Cefotaxime-started 5/13  HPI/Subjective: Awake/approriate No new issues overnight  Objective: Filed Vitals:   09/08/13 0500  BP: 117/67  Pulse: 73  Temp: 98 F (36.7 C)  Resp: 18    Intake/Output Summary (Last 24 hours) at 09/08/13 1200 Last data filed at 09/08/13 0526  Gross per 24 hour  Intake 1561.67 ml  Output    675 ml  Net 886.67 ml   Filed Weights   09/06/13 0500 09/07/13 0602 09/08/13 0517  Weight: 68.6 kg (151 lb 3.8 oz) 68.9 kg (151 lb 14.4 oz) 68.2 kg (150 lb 5.7 oz)    Exam:   General:  Comfortable no distress not fully oriented  Cardiovascular: RRR no gallop noted S1S2 normal  Respiratory: Good aeration no rales or ronchi  Abdomen: soft non-tender  Musculoskeletal: no active synovitis  Data Reviewed: Basic Metabolic Panel:  Recent Labs Lab 09/04/13 0344 09/05/13 0416 09/06/13 0345 09/07/13 0802 09/08/13 0415  NA 142 139 139 137 134*  K 3.8 3.9 3.9 4.0 4.3  CL 108 107 107 105 104  CO2 25 24 22 22 23   GLUCOSE 87 82 119* 85 77  BUN 14 12 12 11 12   CREATININE 0.85 0.86 0.85 0.87 0.84  CALCIUM 8.1* 8.2* 8.3* 8.2* 8.3*   Liver Function Tests:  Recent Labs Lab 09/04/13 0344 09/05/13 0416  09/06/13 0345 09/07/13 0802 09/08/13 0415  AST 32 31 36 38* 31  ALT 12 11 13 14 12   ALKPHOS 143* 138* 165* 161* 151*  BILITOT 0.9 0.6 0.5 0.8 0.5  PROT 4.9* 4.6* 4.7* 4.9* 4.7*  ALBUMIN 1.8* 1.6* 1.6* 1.7* 1.5*   No results found for this basename: LIPASE, AMYLASE,  in the last 168 hours  Recent Labs Lab 09/03/13 1645 09/06/13 1118 09/07/13 0802  AMMONIA 86* 50 43   CBC:  Recent Labs Lab 09/04/13 0344 09/05/13 0416 09/06/13 0345 09/07/13 0802 09/08/13 0415  WBC 6.2 6.4 6.3 8.5 9.1  NEUTROABS 3.2 3.0 3.1 3.8 4.4  HGB 9.5* 9.1* 9.9* 10.7* 10.2*  HCT 28.2* 27.3* 29.1* 31.8* 30.0*  MCV 93.4 93.2 93.9 94.1 92.6  PLT 140* 146* 164 180 196   Cardiac Enzymes: No results found for this basename: CKTOTAL, CKMB, CKMBINDEX, TROPONINI,  in the last 168 hours BNP (last 3 results) No results found for this basename: PROBNP,  in the last 8760 hours CBG: No results found for this basename: GLUCAP,  in the last 168 hours  Recent Results (from the past 240 hour(s))  CULTURE, BLOOD (ROUTINE X 2)     Status: None   Collection Time    09/03/13  4:35 PM      Result Value Ref Range Status   Specimen Description BLOOD ARM RIGHT   Final   Special Requests     Final   Value: BOTTLES DRAWN AEROBIC AND ANAEROBIC 10CCBLUE 5CCRED   Culture  Setup Time  Final   Value: 09/03/2013 20:39     Performed at [facility]   Culture     Final   Value:        BLOOD CULTURE RECEIVED NO GROWTH TO DATE CULTURE WILL BE HELD FOR 5 DAYS BEFORE ISSUING A FINAL NEGATIVE REPORT     Performed at [facility]   Report Status PENDING   Incomplete  CULTURE, BLOOD (ROUTINE X 2)     Status: None   Collection Time    09/03/13  4:42 PM      Result Value Ref Range Status   Specimen Description BLOOD HAND RIGHT   Final   Special Requests BOTTLES DRAWN AEROBIC ONLY 10CC   Final   Culture  Setup Time     Final   Value: 09/03/2013 20:39     Performed at [facility]   Culture     Final    Value: STAPHYLOCOCCUS SPECIES (COAGULASE NEGATIVE)     Note: THE SIGNIFICANCE OF ISOLATING THIS ORGANISM FROM A SINGLE SET OF BLOOD CULTURES WHEN MULTIPLE SETS ARE DRAWN IS UNCERTAIN. PLEASE NOTIFY THE MICROBIOLOGY DEPARTMENT WITHIN ONE WEEK IF SPECIATION AND SENSITIVITIES ARE REQUIRED.     Note: Gram Stain Report Called to,Read Back By and Verified With: ERICA S@2 :30PM ON 09/04/13 BY DANTS     Performed at [facility]   Report Status 09/05/2013 FINAL   Final  URINE CULTURE     Status: None   Collection Time    09/03/13  5:01 PM      Result Value Ref Range Status   Specimen Description URINE, CLEAN CATCH   Final   Special Requests NONE   Final   Culture  Setup Time     Final   Value: 09/03/2013 17:27     Performed at [place]     Final   Value: >=100,000 COLONIES/ML     Performed at [facility]   Culture     Final   Value: Multiple bacterial morphotypes present, none predominant. Suggest appropriate recollection if clinically indicated.     Performed at [facility]   Report Status 09/04/2013 FINAL   Final  MRSA PCR SCREENING     Status: None   Collection Time    09/03/13 10:42 PM      Result Value Ref Range Status   MRSA by PCR NEGATIVE  NEGATIVE Final   Comment:            The GeneXpert MRSA Assay (FDA     approved for NASAL specimens     only), is one component of a     comprehensive MRSA colonization     surveillance program. It is not     intended to diagnose MRSA     infection nor to guide or     monitor treatment for     MRSA infections.  BODY FLUID CULTURE     Status: None   Collection Time    09/04/13  2:35 PM      Result Value Ref Range Status   Specimen Description FLUID ABDOMEN ASCITIC   Final   Special Requests NONE   Final   Gram Stain     Final   Value: NO WBC SEEN     NO ORGANISMS SEEN     Performed at [facility]   Culture     Final   Value: NO GROWTH 3 DAYS  Performed at [facility]   Report Status 09/07/2013 FINAL   Final     Studies: Ct Head Wo Contrast  09/07/2013   CLINICAL DATA:  Confusion  EXAM: CT HEAD WITHOUT CONTRAST  TECHNIQUE: Contiguous axial images were obtained from the base of the skull through the vertex without intravenous contrast.  COMPARISON:  None.  FINDINGS: There is moderate diffuse atrophy. There is no mass, hemorrhage, extra-axial fluid collection, or midline shift. There is small vessel disease throughout the centra semiovale bilaterally, slightly greater anteriorly than elsewhere. No acute appearing infarct is identified on this study. Bony calvarium appears intact. Mastoid air cells are clear. There is rightward deviation of the nasal septum.  IMPRESSION: Atrophy with fairly extensive supratentorial small vessel disease. No acute appearing infarct is identified. No hemorrhage or mass effect. There is rightward deviation of the nasal septum.   Electronically Signed   By: Lowella Grip M.D.   On: 09/07/2013 08:55    Scheduled Meds: . aspirin EC  81 mg Oral QHS  . cefoTAXime (CLAFORAN) IV  2 g Intravenous 3 times per day  . finasteride  5 mg Oral QHS  . furosemide  40 mg Oral Daily  . heparin  5,000 Units Subcutaneous 3 times per day  . lactulose  30 g Oral TID  . levothyroxine  100 mcg Oral QAC breakfast  . metoprolol tartrate  12.5 mg Oral BID  . mirtazapine  30 mg Oral QHS  . pantoprazole  40 mg Oral QHS  . rifaximin  550 mg Oral TID  . senna-docusate  2 tablet Oral Daily  . sodium chloride  3 mL Intravenous Q12H  . spironolactone  50 mg Oral Daily  . vitamin B-12  1,000 mcg Oral Daily   Continuous Infusions: . sodium chloride 50 mL/hr at 09/07/13 1700    Active Problems:   Encephalopathy    Time spent: 37min    Jessica U Vann  [facility] Pager [phone]. If 7PM-7AM, please contact night-coverage at [email or web address], password [facility] 09/08/2013, 12:00 PM  LOS: 5 days

## 2013-09-09 LAB — CBC WITH DIFFERENTIAL/PLATELET
BASOS ABS: 0.1 10*3/uL (ref 0.0–0.1)
Basophils Relative: 2 % — ABNORMAL HIGH (ref 0–1)
EOS PCT: 12 % — AB (ref 0–5)
Eosinophils Absolute: 1 10*3/uL — ABNORMAL HIGH (ref 0.0–0.7)
HCT: 27.7 % — ABNORMAL LOW (ref 39.0–52.0)
HEMOGLOBIN: 9.3 g/dL — AB (ref 13.0–17.0)
LYMPHS PCT: 25 % (ref 12–46)
Lymphs Abs: 2 10*3/uL (ref 0.7–4.0)
MCH: 30.8 pg (ref 26.0–34.0)
MCHC: 33.6 g/dL (ref 30.0–36.0)
MCV: 91.7 fL (ref 78.0–100.0)
MONO ABS: 1 10*3/uL (ref 0.1–1.0)
MONOS PCT: 12 % (ref 3–12)
NEUTROS ABS: 4 10*3/uL (ref 1.7–7.7)
Neutrophils Relative %: 49 % (ref 43–77)
Platelets: 193 10*3/uL (ref 150–400)
RBC: 3.02 MIL/uL — AB (ref 4.22–5.81)
RDW: 15 % (ref 11.5–15.5)
WBC: 8.1 10*3/uL (ref 4.0–10.5)

## 2013-09-09 LAB — COMPREHENSIVE METABOLIC PANEL
ALT: 13 U/L (ref 0–53)
AST: 32 U/L (ref 0–37)
Albumin: 1.5 g/dL — ABNORMAL LOW (ref 3.5–5.2)
Alkaline Phosphatase: 141 U/L — ABNORMAL HIGH (ref 39–117)
BUN: 12 mg/dL (ref 6–23)
CO2: 23 mEq/L (ref 19–32)
Calcium: 8.1 mg/dL — ABNORMAL LOW (ref 8.4–10.5)
Chloride: 105 mEq/L (ref 96–112)
Creatinine, Ser: 0.82 mg/dL (ref 0.50–1.35)
GFR calc Af Amer: 89 mL/min — ABNORMAL LOW (ref >90)
GFR calc non Af Amer: 77 mL/min — ABNORMAL LOW (ref >90)
Glucose, Bld: 76 mg/dL (ref 70–99)
Potassium: 4.1 mEq/L (ref 3.7–5.3)
Sodium: 135 mEq/L — ABNORMAL LOW (ref 137–147)
Total Bilirubin: 0.5 mg/dL (ref 0.3–1.2)
Total Protein: 4.5 g/dL — ABNORMAL LOW (ref 6.0–8.3)

## 2013-09-09 LAB — CULTURE, BLOOD (ROUTINE X 2): CULTURE: NO GROWTH

## 2013-09-09 NOTE — Progress Notes (Signed)
CSW spoke to patient's daughter [name]) over the phone to discuss plan of care options. CSW explained to daughter that patient does not have medicare days, therefore patient may have to go to a facility who will take a patient with medicaid pending. CSW explained that social worker would have to search multiple counties because placement can be challenging for a patient without insurance. Daughter was not happy with this information and states that she will talk with her sisters and call social worker back.  Jeanette Caprice, MSW, [place]

## 2013-09-09 NOTE — Care Management Note (Signed)
    Page 1 of 2   09/11/2013     3:10:32 PM CARE MANAGEMENT NOTE 09/11/2013  Patient:  Raymond Larsen, Raymond Larsen   Account Number:  [id number]  Date Initiated:  09/09/2013  Documentation initiated by:  Kwame Ryland  Subjective/Objective Assessment:   Pt adm on 09/03/13 with AMS, end stage cirrhosis of the liver.  PTA, pt resides at [facility].     Action/Plan:   PT recommending SNF level of care at dc.  CSW consulted to facilitate dc to SNF when medically stable.  Will follow progress.   Anticipated DC Date:  09/11/2013   Anticipated DC Plan:  ASSISTED LIVING / REST HOME  In-house referral  Clinical Social Worker      DC Forensic scientist  CM consult      [facility] Choice  HOME HEALTH   Choice offered to / List presented to:  C-2 HC POA / [place] arranged  HH-1 RN  [place].   Status of service:  Completed, signed off Medicare Important Message given?  NO (If response is "NO", the following Medicare IM given date fields will be blank) Date Medicare IM given:   Date Additional Medicare IM given:    Discharge Disposition:  ASSISTED LIVING  Per UR Regulation:  Reviewed for med. necessity/level of care/duration of stay  If discussed at [place] of Stay Meetings, dates discussed:   09/11/2013    Comments:  09/11/13 Ellan Lambert, RN, BSN [phone] Pt for dc today back to [facility] as prior to admission.  Will need cont follow up with [facility] for hh services as prior to admission.  Notified [facility] of dc today, and resumption orders for [facility] care.  Start of care 24-48h post dc date.

## 2013-09-09 NOTE — Progress Notes (Addendum)
[facility] PROGRESS NOTE  Raymond Ranieri Sr. SWF:[id number] DOB: [birth year] DOA: 09/03/2013 PCP: Tammi Sou, MD  Assessment/Plan: Acute Mental Status Changes with encephalopathy -ammonia ok -oriented to person/place/time but makes odd comments -CT scan of brain shows chronic small vessel and atrophy  delerium on top of dementia -added haldol -sitter for now Placement may be issue  Wound Infection -no active infection per surgical evaluation -will monitor  Cirrhosis/Ascites -had paracentesis done removed 2.1L fluid -cultures negative on fluid D/c abx  Hypertension -right now is controlled    Code Status: DNR Family Communication: spoke with daughter at bedside- appears to be differing opinions between daughters- tried to emphasize quality over quantity of days remaining Disposition Plan: from ALF   Consultants:  General Surgery  Procedures:  paracentesis  Antibiotics: -Vancocin-stopped -Rifaximin-started 5/13 -Cefotaxime-started 5/13  HPI/Subjective: More agitated this AM- better after haldol No CP, no SOB  Objective: Filed Vitals:   09/09/13 0618  BP: 101/53  Pulse: 73  Temp: 98.3 F (36.8 C)  Resp: 18    Intake/Output Summary (Last 24 hours) at 09/09/13 1110 Last data filed at 09/08/13 2304  Gross per 24 hour  Intake    240 ml  Output    525 ml  Net   -285 ml   Filed Weights   09/06/13 0500 09/07/13 0602 09/09/13 0645  Weight: 68.6 kg (151 lb 3.8 oz) 68.9 kg (151 lb 14.4 oz) 68.9 kg (151 lb 14.4 oz)    Exam:   General:  Comfortable no distress - oreinted  Cardiovascular: RRR no gallop noted S1S2 normal  Respiratory: Good aeration no rales or ronchi  Abdomen: soft non-tender  Musculoskeletal: no edema  Data Reviewed: Basic Metabolic Panel:  Recent Labs Lab 09/05/13 0416 09/06/13 0345 09/07/13 0802 09/08/13 0415 09/09/13 0500  NA 139 139 137 134* 135*  K 3.9 3.9 4.0 4.3 4.1  CL 107 107 105 104 105  CO2 24 22 22  23 23   GLUCOSE 82 119* 85 77 76  BUN 12 12 11 12 12   CREATININE 0.86 0.85 0.87 0.84 0.82  CALCIUM 8.2* 8.3* 8.2* 8.3* 8.1*   Liver Function Tests:  Recent Labs Lab 09/05/13 0416 09/06/13 0345 09/07/13 0802 09/08/13 0415 09/09/13 0500  AST 31 36 38* 31 32  ALT 11 13 14 12 13   ALKPHOS 138* 165* 161* 151* 141*  BILITOT 0.6 0.5 0.8 0.5 0.5  PROT 4.6* 4.7* 4.9* 4.7* 4.5*  ALBUMIN 1.6* 1.6* 1.7* 1.5* 1.5*   No results found for this basename: LIPASE, AMYLASE,  in the last 168 hours  Recent Labs Lab 09/03/13 1645 09/06/13 1118 09/07/13 0802  AMMONIA 86* 50 43   CBC:  Recent Labs Lab 09/05/13 0416 09/06/13 0345 09/07/13 0802 09/08/13 0415 09/09/13 0500  WBC 6.4 6.3 8.5 9.1 8.1  NEUTROABS 3.0 3.1 3.8 4.4 4.0  HGB 9.1* 9.9* 10.7* 10.2* 9.3*  HCT 27.3* 29.1* 31.8* 30.0* 27.7*  MCV 93.2 93.9 94.1 92.6 91.7  PLT 146* 164 180 196 193   Cardiac Enzymes: No results found for this basename: CKTOTAL, CKMB, CKMBINDEX, TROPONINI,  in the last 168 hours BNP (last 3 results) No results found for this basename: PROBNP,  in the last 8760 hours CBG: No results found for this basename: GLUCAP,  in the last 168 hours  Recent Results (from the past 240 hour(s))  CULTURE, BLOOD (ROUTINE X 2)     Status: None   Collection Time    09/03/13  4:35 PM  Result Value Ref Range Status   Specimen Description BLOOD ARM RIGHT   Final   Special Requests     Final   Value: BOTTLES DRAWN AEROBIC AND ANAEROBIC 10CCBLUE 5CCRED   Culture  Setup Time     Final   Value: 09/03/2013 20:39     Performed at [facility]   Culture     Final   Value: NO GROWTH 5 DAYS     Performed at [facility]   Report Status 09/09/2013 FINAL   Final  CULTURE, BLOOD (ROUTINE X 2)     Status: None   Collection Time    09/03/13  4:42 PM      Result Value Ref Range Status   Specimen Description BLOOD HAND RIGHT   Final   Special Requests BOTTLES DRAWN AEROBIC ONLY 10CC   Final   Culture   Setup Time     Final   Value: 09/03/2013 20:39     Performed at [facility]   Culture     Final   Value: STAPHYLOCOCCUS SPECIES (COAGULASE NEGATIVE)     Note: THE SIGNIFICANCE OF ISOLATING THIS ORGANISM FROM A SINGLE SET OF BLOOD CULTURES WHEN MULTIPLE SETS ARE DRAWN IS UNCERTAIN. PLEASE NOTIFY THE MICROBIOLOGY DEPARTMENT WITHIN ONE WEEK IF SPECIATION AND SENSITIVITIES ARE REQUIRED.     Note: Gram Stain Report Called to,Read Back By and Verified With: ERICA S@2 :30PM ON 09/04/13 BY DANTS     Performed at [facility]   Report Status 09/05/2013 FINAL   Final  URINE CULTURE     Status: None   Collection Time    09/03/13  5:01 PM      Result Value Ref Range Status   Specimen Description URINE, CLEAN CATCH   Final   Special Requests NONE   Final   Culture  Setup Time     Final   Value: 09/03/2013 17:27     Performed at [place]     Final   Value: >=100,000 COLONIES/ML     Performed at [facility]   Culture     Final   Value: Multiple bacterial morphotypes present, none predominant. Suggest appropriate recollection if clinically indicated.     Performed at [facility]   Report Status 09/04/2013 FINAL   Final  MRSA PCR SCREENING     Status: None   Collection Time    09/03/13 10:42 PM      Result Value Ref Range Status   MRSA by PCR NEGATIVE  NEGATIVE Final   Comment:            The GeneXpert MRSA Assay (FDA     approved for NASAL specimens     only), is one component of a     comprehensive MRSA colonization     surveillance program. It is not     intended to diagnose MRSA     infection nor to guide or     monitor treatment for     MRSA infections.  BODY FLUID CULTURE     Status: None   Collection Time    09/04/13  2:35 PM      Result Value Ref Range Status   Specimen Description FLUID ABDOMEN ASCITIC   Final   Special Requests NONE   Final   Gram Stain     Final   Value: NO WBC SEEN     NO ORGANISMS SEEN  Performed at [facility]     Final   Value: NO GROWTH 3 DAYS     Performed at [facility]   Report Status 09/07/2013 FINAL   Final     Studies: No results found.  Scheduled Meds: . aspirin EC  81 mg Oral QHS  . cefoTAXime (CLAFORAN) IV  2 g Intravenous 3 times per day  . finasteride  5 mg Oral QHS  . furosemide  40 mg Oral Daily  . heparin  5,000 Units Subcutaneous 3 times per day  . lactulose  30 g Oral TID  . levothyroxine  100 mcg Oral QAC breakfast  . metoprolol tartrate  12.5 mg Oral BID  . mirtazapine  30 mg Oral QHS  . pantoprazole  40 mg Oral QHS  . rifaximin  550 mg Oral TID  . senna-docusate  2 tablet Oral Daily  . sodium chloride  3 mL Intravenous Q12H  . spironolactone  50 mg Oral Daily  . vitamin B-12  1,000 mcg Oral Daily   Continuous Infusions: . sodium chloride 50 mL/hr at 09/08/13 1556    Active Problems:   Atrial fibrillation   Hypertension   Alcoholic cirrhosis of liver with ascites   Protein-calorie malnutrition, severe   Encephalopathy    Time spent: 68min    Jessica U Vann  [facility] Pager [phone]. If 7PM-7AM, please contact night-coverage at [email or web address], password [facility] 09/09/2013, 11:10 AM  LOS: 6 days

## 2013-09-10 LAB — CBC WITH DIFFERENTIAL/PLATELET
BASOS PCT: 1 % (ref 0–1)
Basophils Absolute: 0.1 10*3/uL (ref 0.0–0.1)
Eosinophils Absolute: 0.6 10*3/uL (ref 0.0–0.7)
Eosinophils Relative: 9 % — ABNORMAL HIGH (ref 0–5)
HCT: 27.4 % — ABNORMAL LOW (ref 39.0–52.0)
Hemoglobin: 9.2 g/dL — ABNORMAL LOW (ref 13.0–17.0)
Lymphocytes Relative: 32 % (ref 12–46)
Lymphs Abs: 2 10*3/uL (ref 0.7–4.0)
MCH: 31.4 pg (ref 26.0–34.0)
MCHC: 33.6 g/dL (ref 30.0–36.0)
MCV: 93.5 fL (ref 78.0–100.0)
Monocytes Absolute: 0.9 10*3/uL (ref 0.1–1.0)
Monocytes Relative: 14 % — ABNORMAL HIGH (ref 3–12)
NEUTROS ABS: 2.8 10*3/uL (ref 1.7–7.7)
NEUTROS PCT: 44 % (ref 43–77)
PLATELETS: 151 10*3/uL (ref 150–400)
RBC: 2.93 MIL/uL — AB (ref 4.22–5.81)
RDW: 15.1 % (ref 11.5–15.5)
WBC: 6.4 10*3/uL (ref 4.0–10.5)

## 2013-09-10 LAB — COMPREHENSIVE METABOLIC PANEL
ALT: 12 U/L (ref 0–53)
AST: 30 U/L (ref 0–37)
Albumin: 1.5 g/dL — ABNORMAL LOW (ref 3.5–5.2)
Alkaline Phosphatase: 135 U/L — ABNORMAL HIGH (ref 39–117)
BILIRUBIN TOTAL: 0.5 mg/dL (ref 0.3–1.2)
BUN: 14 mg/dL (ref 6–23)
CHLORIDE: 105 meq/L (ref 96–112)
CO2: 22 meq/L (ref 19–32)
CREATININE: 0.82 mg/dL (ref 0.50–1.35)
Calcium: 8.2 mg/dL — ABNORMAL LOW (ref 8.4–10.5)
GFR, EST AFRICAN AMERICAN: 89 mL/min — AB (ref >90)
GFR, EST NON AFRICAN AMERICAN: 77 mL/min — AB (ref >90)
Glucose, Bld: 79 mg/dL (ref 70–99)
Potassium: 4.5 mEq/L (ref 3.7–5.3)
Sodium: 133 mEq/L — ABNORMAL LOW (ref 137–147)
Total Protein: 4.4 g/dL — ABNORMAL LOW (ref 6.0–8.3)

## 2013-09-10 LAB — TSH: TSH: 6.22 u[IU]/mL — ABNORMAL HIGH (ref 0.350–4.500)

## 2013-09-10 LAB — VITAMIN B12: VITAMIN B 12: 1168 pg/mL — AB (ref 211–911)

## 2013-09-10 NOTE — Progress Notes (Addendum)
Chart reviewed.  [facility] PROGRESS NOTE  Raymond Blass Sr. TDD:[id number] DOB: [birth year] DOA: 09/03/2013 PCP: Tammi Sou, MD  Assessment/Plan: Acute Mental Status Changes with encephalopathy Multifactorial: hepatic encephalopathy, likely dementia with delirium. Will check TSH, B12, folate as well. Ammonia level now normal. -CT scan of brain shows chronic small vessel and atrophy  delerium on top of dementia -added haldol -sitter for now  Wound without infection. Off abx  Cirrhosis/Ascites -had paracentesis done removed 2.1L fluid -cultures negative on fluid Some drainage from paracentesis site. Will d/w IR  Hypertension  controlled  Anemia stable  Hypothyroidism  Debility: no SNF coverage. Back to ALF tomorrow. Reaching max hospital benefit  Code Status: DNR Family Communication: d/w Kerin Ransom, daughter at bedside Disposition Plan: from ALF   Consultants:  General Surgery  Procedures:  paracentesis  Antibiotics: -Vancocin-stopped -Rifaximin-started 5/13 -Cefotaxime-started 5/13  HPI/Subjective: No complaints.  Objective: Filed Vitals:   09/10/13 1407  BP: 112/70  Pulse: 77  Temp: 97.5 F (36.4 C)  Resp: 18    Intake/Output Summary (Last 24 hours) at 09/10/13 1720 Last data filed at 09/10/13 1408  Gross per 24 hour  Intake    240 ml  Output      2 ml  Net    238 ml   Filed Weights   09/07/13 0602 09/09/13 0645 09/10/13 0501  Weight: 68.9 kg (151 lb 14.4 oz) 68.9 kg (151 lb 14.4 oz) 69.3 kg (152 lb 12.5 oz)    Exam:   General:  Comfortable no distress - oreinted  Cardiovascular: RRR no gallop noted S1S2 normal  Respiratory: Good aeration no rales or ronchi  Abdomen: soft non-tender. Nondistended. Drainage from LLQ  Musculoskeletal: no edema  Data Reviewed: Basic Metabolic Panel:  Recent Labs Lab 09/06/13 0345 09/07/13 0802 09/08/13 0415 09/09/13 0500 09/10/13 0445  NA 139 137 134* 135* 133*  K 3.9 4.0 4.3 4.1 4.5   CL 107 105 104 105 105  CO2 22 22 23 23 22   GLUCOSE 119* 85 77 76 79  BUN 12 11 12 12 14   CREATININE 0.85 0.87 0.84 0.82 0.82  CALCIUM 8.3* 8.2* 8.3* 8.1* 8.2*   Liver Function Tests:  Recent Labs Lab 09/06/13 0345 09/07/13 0802 09/08/13 0415 09/09/13 0500 09/10/13 0445  AST 36 38* 31 32 30  ALT 13 14 12 13 12   ALKPHOS 165* 161* 151* 141* 135*  BILITOT 0.5 0.8 0.5 0.5 0.5  PROT 4.7* 4.9* 4.7* 4.5* 4.4*  ALBUMIN 1.6* 1.7* 1.5* 1.5* 1.5*   No results found for this basename: LIPASE, AMYLASE,  in the last 168 hours  Recent Labs Lab 09/06/13 1118 09/07/13 0802  AMMONIA 50 43   CBC:  Recent Labs Lab 09/06/13 0345 09/07/13 0802 09/08/13 0415 09/09/13 0500 09/10/13 0445  WBC 6.3 8.5 9.1 8.1 6.4  NEUTROABS 3.1 3.8 4.4 4.0 2.8  HGB 9.9* 10.7* 10.2* 9.3* 9.2*  HCT 29.1* 31.8* 30.0* 27.7* 27.4*  MCV 93.9 94.1 92.6 91.7 93.5  PLT 164 180 196 193 151   Cardiac Enzymes: No results found for this basename: CKTOTAL, CKMB, CKMBINDEX, TROPONINI,  in the last 168 hours BNP (last 3 results) No results found for this basename: PROBNP,  in the last 8760 hours CBG: No results found for this basename: GLUCAP,  in the last 168 hours  Recent Results (from the past 240 hour(s))  CULTURE, BLOOD (ROUTINE X 2)     Status: None   Collection Time    09/03/13  4:35 PM  Result Value Ref Range Status   Specimen Description BLOOD ARM RIGHT   Final   Special Requests     Final   Value: BOTTLES DRAWN AEROBIC AND ANAEROBIC 10CCBLUE 5CCRED   Culture  Setup Time     Final   Value: 09/03/2013 20:39     Performed at [facility]   Culture     Final   Value: NO GROWTH 5 DAYS     Performed at [facility]   Report Status 09/09/2013 FINAL   Final  CULTURE, BLOOD (ROUTINE X 2)     Status: None   Collection Time    09/03/13  4:42 PM      Result Value Ref Range Status   Specimen Description BLOOD HAND RIGHT   Final   Special Requests BOTTLES DRAWN AEROBIC ONLY 10CC    Final   Culture  Setup Time     Final   Value: 09/03/2013 20:39     Performed at [facility]   Culture     Final   Value: STAPHYLOCOCCUS SPECIES (COAGULASE NEGATIVE)     Note: THE SIGNIFICANCE OF ISOLATING THIS ORGANISM FROM A SINGLE SET OF BLOOD CULTURES WHEN MULTIPLE SETS ARE DRAWN IS UNCERTAIN. PLEASE NOTIFY THE MICROBIOLOGY DEPARTMENT WITHIN ONE WEEK IF SPECIATION AND SENSITIVITIES ARE REQUIRED.     Note: Gram Stain Report Called to,Read Back By and Verified With: ERICA S@2 :30PM ON 09/04/13 BY DANTS     Performed at [facility]   Report Status 09/05/2013 FINAL   Final  URINE CULTURE     Status: None   Collection Time    09/03/13  5:01 PM      Result Value Ref Range Status   Specimen Description URINE, CLEAN CATCH   Final   Special Requests NONE   Final   Culture  Setup Time     Final   Value: 09/03/2013 17:27     Performed at [place]     Final   Value: >=100,000 COLONIES/ML     Performed at [facility]   Culture     Final   Value: Multiple bacterial morphotypes present, none predominant. Suggest appropriate recollection if clinically indicated.     Performed at [facility]   Report Status 09/04/2013 FINAL   Final  MRSA PCR SCREENING     Status: None   Collection Time    09/03/13 10:42 PM      Result Value Ref Range Status   MRSA by PCR NEGATIVE  NEGATIVE Final   Comment:            The GeneXpert MRSA Assay (FDA     approved for NASAL specimens     only), is one component of a     comprehensive MRSA colonization     surveillance program. It is not     intended to diagnose MRSA     infection nor to guide or     monitor treatment for     MRSA infections.  BODY FLUID CULTURE     Status: None   Collection Time    09/04/13  2:35 PM      Result Value Ref Range Status   Specimen Description FLUID ABDOMEN ASCITIC   Final   Special Requests NONE   Final   Gram Stain     Final   Value: NO WBC SEEN     NO  ORGANISMS SEEN  Performed at [facility]     Final   Value: NO GROWTH 3 DAYS     Performed at [facility]   Report Status 09/07/2013 FINAL   Final     Studies: No results found.  Scheduled Meds: . aspirin EC  81 mg Oral QHS  . finasteride  5 mg Oral QHS  . furosemide  40 mg Oral Daily  . heparin  5,000 Units Subcutaneous 3 times per day  . lactulose  30 g Oral TID  . levothyroxine  100 mcg Oral QAC breakfast  . metoprolol tartrate  12.5 mg Oral BID  . mirtazapine  30 mg Oral QHS  . pantoprazole  40 mg Oral QHS  . rifaximin  550 mg Oral TID  . senna-docusate  2 tablet Oral Daily  . sodium chloride  3 mL Intravenous Q12H  . spironolactone  50 mg Oral Daily  . vitamin B-12  1,000 mcg Oral Daily   Continuous Infusions: . sodium chloride 50 mL/hr at 09/08/13 1556    Time spent: 65min  Delfina Redwood, MD [facility] Pager [phone]. If 7PM-7AM, please contact night-coverage at [email or web address], password [facility] 09/10/2013, 5:20 PM  LOS: 7 days

## 2013-09-10 NOTE — Progress Notes (Signed)
Physical Therapy Treatment Patient Details Name: Raymond Queenan Sr. MRN: [id number] DOB: 1922-07-12 Today's Date: 09/10/2013    History of Present Illness Admit with encephalopathy.  Paracentesis right LQ.  Had surgery 07/13/13 for incarcerated hernia.     PT Comments    Pt progressing with mobility, can ambulate 200' with min A and RW. Pt continues to have high fall risk but if adequate supervision available, could return to ALF with HHPT. PT will continue to follow.  Follow Up Recommendations  Home health PT;Supervision/Assistance - 24 hour     Equipment Recommendations  Rolling walker with 5" wheels    Recommendations for Other Services       Precautions / Restrictions Precautions Precautions: Fall Restrictions Weight Bearing Restrictions: No    Mobility  Bed Mobility               General bed mobility comments: pt up in chair  Transfers Overall transfer level: Needs assistance Equipment used: Rolling walker (2 wheeled);None Transfers: Sit to/from Stand Sit to Stand: Min assist;Mod assist         General transfer comment: first stand, pt required mod A, second time, therapist stood directly in front of pt with pt's arms on therapist's forearms and he was able to stand with min A to facilitate chest fwd and up  Ambulation/Gait Ambulation/Gait assistance: Min assist Ambulation Distance (Feet): 200 Feet Assistive device: Rolling walker (2 wheeled) Gait Pattern/deviations: Step-through pattern;Decreased stride length;Shuffle;Trunk flexed Gait velocity: decreased   General Gait Details: vc's for posture and for pt to stay within RW with turning. Pt's legs tremble with increased distance though he does not self monitor very well, vc's to take a break and become more steady before beginning again. With standing break, pt could keep ambulating   Stairs            Wheelchair Mobility    Modified Rankin (Stroke Patients Only)       Balance Overall  balance assessment: Needs assistance;History of Falls Sitting-balance support: Bilateral upper extremity supported Sitting balance-Leahy Scale: Good     Standing balance support: Bilateral upper extremity supported;During functional activity Standing balance-Leahy Scale: Poor Standing balance comment: requires UE support                    Cognition Arousal/Alertness: Awake/alert Behavior During Therapy: Anxious Overall Cognitive Status: History of cognitive impairments - at baseline       Memory: Decreased short-term memory              Exercises General Exercises - Lower Extremity Ankle Circles/Pumps: AROM;Both;10 reps;Seated    General Comments        Pertinent Vitals/Pain VSS    Home Living                      Prior Function            PT Goals (current goals can now be found in the care plan section) Acute Rehab PT Goals Patient Stated Goal: to get therapy and then return to A living PT Goal Formulation: With patient Time For Goal Achievement: 09/12/13 Potential to Achieve Goals: Good Progress towards PT goals: Progressing toward goals    Frequency  Min 3X/week    PT Plan Discharge plan needs to be updated    Co-evaluation             End of Session Equipment Utilized During Treatment: Gait belt Activity Tolerance: Patient tolerated treatment well Patient  left: in chair;with chair alarm set;with call bell/phone within reach     Time: 1027-1052 PT Time Calculation (min): 25 min  Charges:  $Gait Training: 23-37 mins                    G Codes:     Leighton Roach, PT  Acute Rehab Services  [place] 09/10/2013, 12:20 PM

## 2013-09-11 DIAGNOSIS — E43 Unspecified severe protein-calorie malnutrition: Secondary | ICD-10-CM

## 2013-09-11 LAB — FOLATE RBC: RBC FOLATE: 1266 ng/mL — AB (ref >280)

## 2013-09-11 MED ORDER — LACTULOSE 10 GM/15ML PO SOLN: 30.0000 g | Freq: Two times a day (BID) | ORAL | Status: DC

## 2013-09-11 MED ORDER — APAP 325 MG PO TABS: 650.0000 mg | ORAL_TABLET | Freq: Four times a day (QID) | ORAL | Status: DC | PRN

## 2013-09-11 MED ORDER — LEVOTHYROXINE SODIUM 125 MCG PO TABS: 125.0000 ug | ORAL_TABLET | Freq: Every day | ORAL | Status: DC

## 2013-09-11 NOTE — Progress Notes (Signed)
CSW spoke to daughter [name] over the phone regarding possible dc today. Patient's daughter states she will not be able to transport patient, but to have EMS do it. CSW contacted ALF to see if they can take patient back today. Mia from ALF asked social worker to send over [facility] and she will contact social worker back.  Jeanette Caprice, MSW, [place]

## 2013-09-11 NOTE — Discharge Summary (Addendum)
Physician Discharge Summary  Raymond Cuthrell Sr. CBJ:[id number] DOB: 1922/09/14 DOA: 09/03/2013  PCP: Tammi Sou, MD  Admit date: 09/03/2013 Discharge date: 09/11/2013  Time spent: greater than 30 minutes  Recommendations for Outpatient Follow-up:  1. See discharge instructions below 2. Home PT and rolling walker arranged for ALF 3. Titrate lactulose for 3-5 stools per day 4. TSH in 2-3 months  Discharge Diagnoses:  Principle problem   Encephalopathy: multifactorial: hepatic encephalopathy, likely delirium in the setting of dementia, hypothyroidism Active Problems:   Atrial fibrillation, not a coumadin candidate   Hypertension   Alcoholic cirrhosis of liver with ascites, no longer drinking   Protein-calorie malnutrition, severe Hypothyroidism with high TSH, synthroid adjusted Right groin wound post hernia repair without infection Chronic anemia  Discharge Condition: stable  Code status DNR  Filed Weights   09/07/13 0602 09/09/13 0645 09/10/13 0501  Weight: 68.9 kg (151 lb 14.4 oz) 68.9 kg (151 lb 14.4 oz) 69.3 kg (152 lb 12.5 oz)    History of present illness:  80 y.o. year old male with prior history of coronary artery disease, A. fib, alcoholic cirrhosis (quit heavy drinking 30 years ago), incarcerated inguinal hernia status post repair March 2015 presenting with encephalopathy and wound infection. Daughter's primary caregiver. Patient currently lives in an assisted-living facility. Daughter states that patient has had progressive confusion over the past 3-4 days. Denies any fevers or chills. Patient has also had some clear drainage from his prior incision site from his hernia repair. Daughter states that this drainage has mildly increased over the past 2 or 3 days as well. There is also erythematous around the drainage site. Patient was seen on April 13 for surgery followup. Was noted to have had a small area of infection the right groin which was open. Was seen by PCP today.  There was some concern for SBP. Daughter reports some abdominal pain 22 days ago but none over the past 12-24 hours.  On presentation to the ER, patient hemodynamically stable. Afebrile. White blood cell count within normal limits 7.6. Ammonia level 86. UA mildly indicative of infection. A CT of abdomen was obtained that showed a large amount of ascites and nodular hepatic contour concerning for cirrhosis. There was also fluid tracking into the region of the right inguinal canal with some foci of gas and trace fluid without evidence of bowel entrapment concerning for possible irritation to the skin surface. Chest x-ray negative for pneumonia. Per ER physician assistant, she discuss case with general surgery. There was no concern for infection. Confusion felt likely to be due to hepatic encephalopathy.   Hospital Course:  Admitted to hospitalist. Started on empiric antibiotics. Dr. Donne Hazel consult it and did not feel the wound was infected, just slow to heal. By discharge, had nearly closed and antibiotics were stopped.  Encephalopathy  Multifactorial:  Ammonia level on admission was 86. Patient was started on lactulose with normalization of his hyperammonemia. TSH noted to be 6.2, may be contributing. Synthroid was increased from 100 mcg to 125 mcg a day. He will need a repeat TSH in a few months. -CT scan of brain shows chronic small vessel and atrophy, likely underlying dementia.  Cirrhosis/Ascites  Patient no longer drinks but has a history of heavy drinking. -had paracentesis done removed 2.1L fluid to rule out SBP -cultures negative on fluid  Some drainage from paracentesis site which has now resolved  Anemia stable without signs of ongoing bleeding  Hypothyroidism: See above.  Debility: no SNF coverage. Back to  ALF with physical therapy. Has received maximal hospital benefit.  Procedures:  Paracentesis  Consultations:  General surgery  Interventional radiology  Discharge  Exam: Filed Vitals:   09/11/13 0415  BP: 99/54  Pulse: 79  Temp: 97.5 F (36.4 C)  Resp: 18    General: Alert. Confused. Eating lines. Cooperative. Cardiovascular: Regular rate and rhythm without murmurs gallops rubs Respiratory: Soft nontender nondistended Extremities no clubbing cyanosis or edema  Discharge Instructions   Discharge instructions    Complete by:  As directed   Regular diet with chopped meats     Walk with assistance    Complete by:  As directed             Medication List    STOP taking these medications       meclizine 25 MG tablet  Commonly known as:  ANTIVERT     polyethylene glycol packet  Commonly known as:  MIRALAX / GLYCOLAX     sennosides-docusate sodium 8.6-50 MG tablet  Commonly known as:  SENOKOT-S      TAKE these medications       APAP 325 MG tablet  Take 2 tablets (650 mg total) by mouth every 6 (six) hours as needed for mild pain or fever.     aspirin EC 81 MG tablet  Take 81 mg by mouth at bedtime.     finasteride 5 MG tablet  Commonly known as:  PROSCAR  Take 5 mg by mouth at bedtime.     furosemide 20 MG tablet  Commonly known as:  LASIX  Take 40 mg by mouth daily.     guaiFENesin 100 MG/5ML Soln  Commonly known as:  ROBITUSSIN  Take 200 mg by mouth every 6 (six) hours as needed for cough or to loosen phlegm. Do not exceed 4 doses in 24 hours.  If cough has not improved in 24 hours notify physicians     lactulose 10 GM/15ML solution  Commonly known as:  CHRONULAC  Take 45 mLs (30 g total) by mouth 2 (two) times daily.     levothyroxine 125 MCG tablet  Commonly known as:  SYNTHROID, LEVOTHROID  Take 1 tablet (125 mcg total) by mouth daily before breakfast.     LORazepam 0.5 MG tablet  Commonly known as:  ATIVAN  Take 1 tablet (0.5 mg total) by mouth 2 (two) times daily as needed for anxiety or sleep.     metoprolol tartrate 25 MG tablet  Commonly known as:  LOPRESSOR  Take 0.5 tablets (12.5 mg total) by mouth 2  (two) times daily.     mirtazapine 30 MG tablet  Commonly known as:  REMERON  Take 30 mg by mouth at bedtime.     nitroGLYCERIN 0.4 MG SL tablet  Commonly known as:  NITROSTAT  Place 0.4 mg under the tongue every 5 (five) minutes as needed for chest pain. Give 1 tablet every 5 minutes for chest pain x 3 doses.  If unrelieved after 3 doses, call MD     pantoprazole 40 MG tablet  Commonly known as:  PROTONIX  Take 40 mg by mouth at bedtime.     spironolactone 25 MG tablet  Commonly known as:  ALDACTONE  Take 50 mg by mouth daily.     vitamin B-12 1000 MCG tablet  Commonly known as:  CYANOCOBALAMIN  Take 1,000 mcg by mouth daily.       Allergies  Allergen Reactions  . Amitriptyline Other (See Comments)  Dizziness   . Beta Adrenergic Blockers Other (See Comments)    lethargy  . Imdur [Isosorbide]     Headache   . Niacin And Related Rash       . Sulfa Antibiotics Itching, Nausea And Vomiting and Rash       Follow-up Information   Follow up with MCGOWEN,PHILIP H, MD In 2 weeks.   Specialty:  Family Medicine   Contact information:   [id number] [place] [id number] [phone]        The results of significant diagnostics from this hospitalization (including imaging, microbiology, ancillary and laboratory) are listed below for reference.    Significant Diagnostic Studies: Ct Head Wo Contrast  09/07/2013   CLINICAL DATA:  Confusion  EXAM: CT HEAD WITHOUT CONTRAST  TECHNIQUE: Contiguous axial images were obtained from the base of the skull through the vertex without intravenous contrast.  COMPARISON:  None.  FINDINGS: There is moderate diffuse atrophy. There is no mass, hemorrhage, extra-axial fluid collection, or midline shift. There is small vessel disease throughout the centra semiovale bilaterally, slightly greater anteriorly than elsewhere. No acute appearing infarct is identified on this study. Bony calvarium appears intact. Mastoid air cells are  clear. There is rightward deviation of the nasal septum.  IMPRESSION: Atrophy with fairly extensive supratentorial small vessel disease. No acute appearing infarct is identified. No hemorrhage or mass effect. There is rightward deviation of the nasal septum.   Electronically Signed   By: Lowella Grip M.D.   On: 09/07/2013 08:55   Ct Abdomen Pelvis W Contrast  09/03/2013   CLINICAL DATA:  Drainage from hernia repair site, confusion  EXAM: CT ABDOMEN AND PELVIS WITH CONTRAST  TECHNIQUE: Multidetector CT imaging of the abdomen and pelvis was performed using the standard protocol following bolus administration of intravenous contrast.  CONTRAST:  16mL OMNIPAQUE IOHEXOL 300 MG/ML  SOLN  COMPARISON:  07/13/2013, 03/09/2012  FINDINGS: There is a large amount of ascites. Hepatic contour is nodular which may be seen with cirrhosis. Spleen is at upper limits of normal in size without focal abnormality. Right renal cortical cysts are reidentified. No radiopaque renal or ureteral calculus. Left kidney, adrenal glands, and pancreas are unremarkable. Cholecystectomy clips are noted. Mild prominence of the common bile duct is identified measuring 1 cm at its mid portion image 28.  Gas is present within the soft tissues over the right inguinal region for example image 87, with a small amount of fluid subjacent to this area. Foci of gas in the visualized within the right inguinal region subcutaneous fat. There is apparent laxity of the right inguinal region with fluid bulging into the right inguinal canal image 78. No evidence for bowel entrapment or obstruction. The patient is status post left mid abdominal bowel anastomosis. Splenorenal vascular collaterals are noted. Rightward scoliosis of the thoracolumbar spine is noted with extensive multilevel disc degenerative change.  IMPRESSION: Large amount of ascites with nodular hepatic contour and splenorenal collaterals suggesting cirrhosis.  Some fluid is noted tracking into  the region of the right inguinal canal which may indicate laxity. Foci of gas and trace fluid are identified within the right inguinal hernia repair site without evidence of bowel entrapment or obstruction, which could indicate communication with the skin surface, or fluid within the wound representing seroma or abscess, or communication with the abdominal cavity.  These results were called by telephone at the time of interpretation on 09/03/2013 at 5:57 PM to [facility] [place] , who verbally acknowledged  these results.   Electronically Signed   By: Conchita Paris M.D.   On: 09/03/2013 18:01   US Paracentesis  09/04/2013   CLINICAL DATA:  Ascites. Request diagnostic and therapeutic paracentesis.  EXAM: ULTRASOUND GUIDED PARACENTESIS  COMPARISON:  None.  PROCEDURE: An ultrasound guided paracentesis was thoroughly discussed with the patient and questions answered. The benefits, risks, alternatives and complications were also discussed. The patient understands and wishes to proceed with the procedure. Written consent was obtained.  Ultrasound was performed to localize and mark an adequate pocket of fluid in the right lower quadrant of the abdomen. The area was then prepped and draped in the normal sterile fashion. 1% Lidocaine was used for local anesthesia. Under ultrasound guidance a 19 gauge Yueh catheter was introduced. Paracentesis was performed. The catheter was removed and a dressing applied.  COMPLICATIONS: None immediate  FINDINGS: A total of approximately 2.1 L of clear yellow fluid was removed. A fluid sample was sent for laboratory analysis.  IMPRESSION: Successful ultrasound guided paracentesis yielding 2.1 L Of ascites.  Read by Ascencion Dike PA-C   Electronically Signed   By: Arne Cleveland M.D.   On: 09/04/2013 15:44   Dg Chest Port 1 View  09/03/2013   CLINICAL DATA:  Abdominal pain last 4 days  EXAM: PORTABLE CHEST - 1 VIEW  COMPARISON:  07/13/2013  FINDINGS: Cardiomediastinal silhouette is stable.  Right shoulder prosthesis again noted. No acute infiltrate or pleural effusion. No pulmonary edema.  IMPRESSION: No active disease.   Electronically Signed   By: Lahoma Crocker M.D.   On: 09/03/2013 17:09    Microbiology: Recent Results (from the past 240 hour(s))  CULTURE, BLOOD (ROUTINE X 2)     Status: None   Collection Time    09/03/13  4:35 PM      Result Value Ref Range Status   Specimen Description BLOOD ARM RIGHT   Final   Special Requests     Final   Value: BOTTLES DRAWN AEROBIC AND ANAEROBIC 10CCBLUE 5CCRED   Culture  Setup Time     Final   Value: 09/03/2013 20:39     Performed at [facility]   Culture     Final   Value: NO GROWTH 5 DAYS     Performed at [facility]   Report Status 09/09/2013 FINAL   Final  CULTURE, BLOOD (ROUTINE X 2)     Status: None   Collection Time    09/03/13  4:42 PM      Result Value Ref Range Status   Specimen Description BLOOD HAND RIGHT   Final   Special Requests BOTTLES DRAWN AEROBIC ONLY 10CC   Final   Culture  Setup Time     Final   Value: 09/03/2013 20:39     Performed at [facility]   Culture     Final   Value: STAPHYLOCOCCUS SPECIES (COAGULASE NEGATIVE)     Note: THE SIGNIFICANCE OF ISOLATING THIS ORGANISM FROM A SINGLE SET OF BLOOD CULTURES WHEN MULTIPLE SETS ARE DRAWN IS UNCERTAIN. PLEASE NOTIFY THE MICROBIOLOGY DEPARTMENT WITHIN ONE WEEK IF SPECIATION AND SENSITIVITIES ARE REQUIRED.     Note: Gram Stain Report Called to,Read Back By and Verified With: ERICA S@2 :30PM ON 09/04/13 BY DANTS     Performed at [facility]   Report Status 09/05/2013 FINAL   Final  URINE CULTURE     Status: None   Collection Time    09/03/13  5:01 PM  Result Value Ref Range Status   Specimen Description URINE, CLEAN CATCH   Final   Special Requests NONE   Final   Culture  Setup Time     Final   Value: 09/03/2013 17:27     Performed at [facility]     Final   Value: >=100,000 COLONIES/ML      Performed at [facility]   Culture     Final   Value: Multiple bacterial morphotypes present, none predominant. Suggest appropriate recollection if clinically indicated.     Performed at [facility]   Report Status 09/04/2013 FINAL   Final  MRSA PCR SCREENING     Status: None   Collection Time    09/03/13 10:42 PM      Result Value Ref Range Status   MRSA by PCR NEGATIVE  NEGATIVE Final   Comment:            The GeneXpert MRSA Assay (FDA     approved for NASAL specimens     only), is one component of a     comprehensive MRSA colonization     surveillance program. It is not     intended to diagnose MRSA     infection nor to guide or     monitor treatment for     MRSA infections.  BODY FLUID CULTURE     Status: None   Collection Time    09/04/13  2:35 PM      Result Value Ref Range Status   Specimen Description FLUID ABDOMEN ASCITIC   Final   Special Requests NONE   Final   Gram Stain     Final   Value: NO WBC SEEN     NO ORGANISMS SEEN     Performed at [facility]   Culture     Final   Value: NO GROWTH 3 DAYS     Performed at [facility]   Report Status 09/07/2013 FINAL   Final     Labs: Basic Metabolic Panel:  Recent Labs Lab 09/06/13 0345 09/07/13 0802 09/08/13 0415 09/09/13 0500 09/10/13 0445  NA 139 137 134* 135* 133*  K 3.9 4.0 4.3 4.1 4.5  CL 107 105 104 105 105  CO2 22 22 23 23 22   GLUCOSE 119* 85 77 76 79  BUN 12 11 12 12 14   CREATININE 0.85 0.87 0.84 0.82 0.82  CALCIUM 8.3* 8.2* 8.3* 8.1* 8.2*   Liver Function Tests:  Recent Labs Lab 09/06/13 0345 09/07/13 0802 09/08/13 0415 09/09/13 0500 09/10/13 0445  AST 36 38* 31 32 30  ALT 13 14 12 13 12   ALKPHOS 165* 161* 151* 141* 135*  BILITOT 0.5 0.8 0.5 0.5 0.5  PROT 4.7* 4.9* 4.7* 4.5* 4.4*  ALBUMIN 1.6* 1.7* 1.5* 1.5* 1.5*   No results found for this basename: LIPASE, AMYLASE,  in the last 168 hours  Recent Labs Lab 09/06/13 1118 09/07/13 0802  AMMONIA  50 43   CBC:  Recent Labs Lab 09/06/13 0345 09/07/13 0802 09/08/13 0415 09/09/13 0500 09/10/13 0445  WBC 6.3 8.5 9.1 8.1 6.4  NEUTROABS 3.1 3.8 4.4 4.0 2.8  HGB 9.9* 10.7* 10.2* 9.3* 9.2*  HCT 29.1* 31.8* 30.0* 27.7* 27.4*  MCV 93.9 94.1 92.6 91.7 93.5  PLT 164 180 196 193 151   Cardiac Enzymes: No results found for this basename: CKTOTAL, CKMB, CKMBINDEX, TROPONINI,  in the last 168 hours BNP: BNP (last 3 results)  No results found for this basename: PROBNP,  in the last 8760 hours CBG: No results found for this basename: GLUCAP,  in the last 168 hours  Signed:  Delfina Redwood, MD  [facility] 09/11/2013, 12:26 PM

## 2013-09-11 NOTE — Progress Notes (Signed)
CSW received call from ALF and states that they are good to take patient back. CSW called patient's daughter to inform of dc and will set up EMS transport for 2pm.   CSW signing off at this time. Please re consult if social work needs arise.  Jeanette Caprice, MSW, [place]

## 2013-09-12 ENCOUNTER — Telehealth: Payer: Self-pay | Admitting: Family Medicine

## 2013-09-12 NOTE — Telephone Encounter (Signed)
Patient needs a letter on our letter head stating that Pt is not competent enough to make financial decisions.  Please advise.

## 2013-09-18 ENCOUNTER — Encounter: Payer: Self-pay | Admitting: Family Medicine

## 2013-09-18 NOTE — Telephone Encounter (Signed)
Rx written to d/c colace. Letter written stating he is unable to make financial decisions.

## 2013-09-18 NOTE — Telephone Encounter (Signed)
Ralene Cork from [facility] called and wanted to know if we could send an order for pt to stop taking the Colace because it  is causing stomach upset. He is doing well on the Lactulose TID and is having successful bowel movements. Kenney Houseman wants to stress to them the importance of giving pt the lactulose in the letter also. Please advise.

## 2013-09-19 ENCOUNTER — Telehealth: Payer: Self-pay | Admitting: *Deleted

## 2013-09-19 NOTE — Telephone Encounter (Signed)
Order faxed.

## 2013-09-19 NOTE — Telephone Encounter (Signed)
Nurse from [facility] called concerning pt's medication. Nurse stated that Colace was not on pt's med list. Patient is taking lactulose and Senexon-S bid (for bowel movements).

## 2013-09-19 NOTE — Telephone Encounter (Signed)
Must continue lactulose but I wrote a rx to d/c senexon-S---pls fax rx to [facility].-thx

## 2013-09-19 NOTE — Telephone Encounter (Signed)
Order faxed to homecare with attn to [facility]. [phone]

## 2013-09-29 ENCOUNTER — Telehealth: Payer: Self-pay

## 2013-09-29 NOTE — Telephone Encounter (Signed)
Lilia Pro from [facility] would like to know if pt is still supposed to be taking Xifaxan? If so we need to fax an order to [phone]

## 2013-10-01 ENCOUNTER — Other Ambulatory Visit: Payer: Self-pay | Admitting: Family Medicine

## 2013-10-01 DIAGNOSIS — E876 Hypokalemia: Secondary | ICD-10-CM

## 2013-10-01 NOTE — Telephone Encounter (Signed)
Yes, remind me to write out an order on rx pad.-thx

## 2013-10-01 NOTE — Telephone Encounter (Signed)
Will be faxing over dc order for a substitute for Xifaxan 550 MG tab. Medication cost $2021.44.

## 2013-10-01 NOTE — Telephone Encounter (Signed)
Rx faxed

## 2013-10-02 ENCOUNTER — Ambulatory Visit (INDEPENDENT_AMBULATORY_CARE_PROVIDER_SITE_OTHER): Payer: Medicare HMO | Admitting: Family Medicine

## 2013-10-02 ENCOUNTER — Encounter: Payer: Self-pay | Admitting: Family Medicine

## 2013-10-02 VITALS — BP 97/61 | HR 75 | Temp 99.0°F | Resp 18 | Wt 167.0 lb

## 2013-10-02 DIAGNOSIS — K7290 Hepatic failure, unspecified without coma: Secondary | ICD-10-CM

## 2013-10-02 DIAGNOSIS — D649 Anemia, unspecified: Secondary | ICD-10-CM

## 2013-10-02 DIAGNOSIS — R188 Other ascites: Secondary | ICD-10-CM

## 2013-10-02 DIAGNOSIS — I4891 Unspecified atrial fibrillation: Secondary | ICD-10-CM

## 2013-10-02 DIAGNOSIS — K7460 Unspecified cirrhosis of liver: Secondary | ICD-10-CM

## 2013-10-02 DIAGNOSIS — K7030 Alcoholic cirrhosis of liver without ascites: Secondary | ICD-10-CM

## 2013-10-02 DIAGNOSIS — E43 Unspecified severe protein-calorie malnutrition: Secondary | ICD-10-CM

## 2013-10-02 DIAGNOSIS — K7682 Hepatic encephalopathy: Secondary | ICD-10-CM

## 2013-10-02 DIAGNOSIS — K7031 Alcoholic cirrhosis of liver with ascites: Secondary | ICD-10-CM

## 2013-10-02 DIAGNOSIS — E039 Hypothyroidism, unspecified: Secondary | ICD-10-CM

## 2013-10-02 LAB — CBC WITH DIFFERENTIAL/PLATELET
BASOS PCT: 0.4 % (ref 0.0–3.0)
Basophils Absolute: 0 10*3/uL (ref 0.0–0.1)
EOS ABS: 0.9 10*3/uL — AB (ref 0.0–0.7)
Eosinophils Relative: 11.7 % — ABNORMAL HIGH (ref 0.0–5.0)
HCT: 31 % — ABNORMAL LOW (ref 39.0–52.0)
Hemoglobin: 10.3 g/dL — ABNORMAL LOW (ref 13.0–17.0)
LYMPHS PCT: 28.2 % (ref 12.0–46.0)
Lymphs Abs: 2.1 10*3/uL (ref 0.7–4.0)
MCHC: 33.3 g/dL (ref 30.0–36.0)
MCV: 95.4 fl (ref 78.0–100.0)
MONOS PCT: 10.2 % (ref 3.0–12.0)
Monocytes Absolute: 0.8 10*3/uL (ref 0.1–1.0)
NEUTROS PCT: 49.5 % (ref 43.0–77.0)
Neutro Abs: 3.7 10*3/uL (ref 1.4–7.7)
Platelets: 195 10*3/uL (ref 150.0–400.0)
RBC: 3.25 Mil/uL — ABNORMAL LOW (ref 4.22–5.81)
RDW: 16.1 % — ABNORMAL HIGH (ref 11.5–15.5)
WBC: 7.4 10*3/uL (ref 4.0–10.5)

## 2013-10-02 LAB — BASIC METABOLIC PANEL
BUN: 15 mg/dL (ref 6–23)
CO2: 27 mEq/L (ref 19–32)
CREATININE: 0.9 mg/dL (ref 0.4–1.5)
Calcium: 8.2 mg/dL — ABNORMAL LOW (ref 8.4–10.5)
Chloride: 104 mEq/L (ref 96–112)
GFR: 87.88 mL/min (ref >60.00)
Glucose, Bld: 94 mg/dL (ref 70–99)
Potassium: 3.5 mEq/L (ref 3.5–5.1)
Sodium: 136 mEq/L (ref 135–145)

## 2013-10-02 NOTE — Assessment & Plan Note (Signed)
Abd ascites returning, LE edema returning, wt up 10 lb or so since d/c from hospital 3 wks ago. Will try to get some improvement in this with increase in diuretics: lasix to 80mg  qd x 4d and aldactone to 100 mg qd x 4d, then resume 40mg  and 50mg  qd dosing. BMET today plus repeat BMET in 5d. Low Na diet.

## 2013-10-02 NOTE — Assessment & Plan Note (Signed)
ASA 81mg  --not coumadin candidate. Continue lopressor 12.5mg  bid.

## 2013-10-02 NOTE — Assessment & Plan Note (Signed)
His hypoalbuminemia is contributing quite a bit to his edema/ascites. He is currently eating well and should be getting ensure in his diet at his ALF.

## 2013-10-02 NOTE — Telephone Encounter (Signed)
This med will be d/c'd.

## 2013-10-02 NOTE — Progress Notes (Signed)
Pre visit review using our clinic review tool, if applicable. No additional management support is needed unless otherwise documented below in the visit note. 

## 2013-10-02 NOTE — Assessment & Plan Note (Signed)
He cannot afford xifaxan so I'll d/c this. Decrease lactulose to one 42ml dose every morning and see if this still gets adequate BMs but avoids the incontinence of bowels overnight/in bed.

## 2013-10-02 NOTE — Assessment & Plan Note (Signed)
Chronic. Recheck CBC today.

## 2013-10-02 NOTE — Assessment & Plan Note (Signed)
TSH up a little in hosp: Lab Results  Component Value Date   TSH 6.220* 09/10/2013  His synthroid dose was increased to 125 mcg qd in hosp 3 wks ago.  Recheck TSH 3-4 wks from now.

## 2013-10-02 NOTE — Progress Notes (Addendum)
OFFICE VISIT  10/02/2013   CC:  Chief Complaint  Patient presents with  . Follow-up  . Medication Problem    Lactulose, Xifaxan     HPI:    Patient is a 80 y.o. Caucasian male who presents for 3 wk hosp f/u for hepatic encephalopathy and ascites that required paracentesis (5/13-5/21/15).  This was his first hospitalization for these two specific issues.  Hospital records reviewed. He got 2L of peritoneal fluid removed, culture negative---felt a lot better. Was started on lactulose in hosp and responded--MS returned to baseline.  He was placed on xifaxan to try to decrease his chances of developing hepatic encephalopathy in the future but this med is cost prohibitive so he has not been taking it.  Since d/c he has been eating well.  Having trouble with loose BMs too frequent on the lactulose dosing currently (?bid or tid?). Says he is urinating more than normal.  Frequent incontinence of bowels while in bed at night. No fevers.  NO abd pain. No mental status changes except for one day last week he had disorientation and fortunately this was gone the following day.  PMH: Past medical, surgical, social, and family history reviewed and no changes are noted since last office visit.   Outpatient Prescriptions Prior to Visit  Medication Sig Dispense Refill  . acetaminophen 325 MG tablet Take 2 tablets (650 mg total) by mouth every 6 (six) hours as needed for mild pain or fever.  30 tablet  0  . aspirin EC 81 MG tablet Take 81 mg by mouth at bedtime.      . finasteride (PROSCAR) 5 MG tablet Take 5 mg by mouth at bedtime.       . furosemide (LASIX) 20 MG tablet Take 40 mg by mouth daily.      Marland Kitchen guaiFENesin (ROBITUSSIN) 100 MG/5ML SOLN Take 200 mg by mouth every 6 (six) hours as needed for cough or to loosen phlegm. Do not exceed 4 doses in 24 hours.  If cough has not improved in 24 hours notify physicians      . lactulose (CHRONULAC) 10 GM/15ML solution Take 45 mLs (30 g total) by mouth 2  (two) times daily.  240 mL  0  . levothyroxine (SYNTHROID, LEVOTHROID) 125 MCG tablet Take 1 tablet (125 mcg total) by mouth daily before breakfast.  30 tablet  0  . metoprolol tartrate (LOPRESSOR) 25 MG tablet Take 0.5 tablets (12.5 mg total) by mouth 2 (two) times daily.  60 tablet  5  . mirtazapine (REMERON) 30 MG tablet Take 30 mg by mouth at bedtime.       . nitroGLYCERIN (NITROSTAT) 0.4 MG SL tablet Place 0.4 mg under the tongue every 5 (five) minutes as needed for chest pain. Give 1 tablet every 5 minutes for chest pain x 3 doses.  If unrelieved after 3 doses, call MD      . pantoprazole (PROTONIX) 40 MG tablet Take 40 mg by mouth at bedtime.       Marland Kitchen spironolactone (ALDACTONE) 25 MG tablet Take 50 mg by mouth daily.      . vitamin B-12 (CYANOCOBALAMIN) 1000 MCG tablet Take 1,000 mcg by mouth daily.      Marland Kitchen LORazepam (ATIVAN) 0.5 MG tablet Take 1 tablet (0.5 mg total) by mouth 2 (two) times daily as needed for anxiety or sleep.  10 tablet  0   No facility-administered medications prior to visit.    Allergies  Allergen Reactions  . Amitriptyline Other (  See Comments)    Dizziness   . Beta Adrenergic Blockers Other (See Comments)    lethargy  . Imdur [Isosorbide]     Headache   . Niacin And Related Rash       . Sulfa Antibiotics Itching, Nausea And Vomiting and Rash    ROS As per HPI  PE: Blood pressure 97/61, pulse 75, temperature 99 F (37.2 C), temperature source Oral, resp. rate 18, weight 167 lb (75.751 kg), SpO2 97.00%. Gen: Alert, frail, elderly-appearing but in NAD.  Sitting in wheelchair.  Patient is oriented to person, place, time, and situation.   PZW:CHEN: no injection, icteris, swelling, or exudate.  EOMI, PERRLA. Mouth: lips without lesion/swelling.  Oral mucosa pink and moist. Oropharynx without erythema, exudate, or swelling.  CV: irregular rhythm, rate approx 70s Chest is clear, no wheezing or rales. Normal symmetric air entry throughout both lung fields. No  chest wall deformities or tenderness. ABD: examined in chair, but limited exam does reveal some obvious ascites but no tenderness. EXT: Marked pitting edema in both LL's from knees down to feet.  No ulcerations or rashes.  LABS:  None today  IMPRESSION AND PLAN:  Alcoholic cirrhosis of liver with ascites Abd ascites returning, LE edema returning, wt up 10 lb or so since d/c from hospital 3 wks ago. Will try to get some improvement in this with increase in diuretics: lasix to 80mg  qd x 4d and aldactone to 100 mg qd x 4d, then resume 40mg  and 50mg  qd dosing. BMET today plus repeat BMET in 5d. Low Na diet.   Encephalopathy, hepatic He cannot afford xifaxan so I'll d/c this. Decrease lactulose to one 21ml dose every morning and see if this still gets adequate BMs but avoids the incontinence of bowels overnight/in bed.  Hypothyroidism TSH up a little in hosp: Lab Results  Component Value Date   TSH 6.220* 09/10/2013  His synthroid dose was increased to 125 mcg qd in hosp 3 wks ago.  Recheck TSH 3-4 wks from now.  Atrial fibrillation ASA 81mg  --not coumadin candidate. Continue lopressor 12.5mg  bid.  Protein-calorie malnutrition, severe His hypoalbuminemia is contributing quite a bit to his edema/ascites. He is currently eating well and should be getting ensure in his diet at his ALF.  Normocytic anemia Chronic. Recheck CBC today.   An After Visit Summary was printed and given to the patient.  FOLLOW UP:  BMET in 5d, o/v in 10d

## 2013-10-09 ENCOUNTER — Other Ambulatory Visit: Payer: Self-pay | Admitting: Nurse Practitioner

## 2013-10-09 ENCOUNTER — Telehealth: Payer: Self-pay | Admitting: Family Medicine

## 2013-10-09 DIAGNOSIS — R609 Edema, unspecified: Secondary | ICD-10-CM

## 2013-10-09 DIAGNOSIS — E876 Hypokalemia: Secondary | ICD-10-CM

## 2013-10-09 MED ORDER — POTASSIUM CHLORIDE ER 10 MEQ PO TBCR: 10.0000 meq | EXTENDED_RELEASE_TABLET | Freq: Three times a day (TID) | ORAL | Status: DC

## 2013-10-09 MED ORDER — FUROSEMIDE 20 MG PO TABS: 20.0000 mg | ORAL_TABLET | Freq: Every day | ORAL | Status: DC

## 2013-10-09 NOTE — Telephone Encounter (Signed)
LMOM for pt's daughter to CB.

## 2013-10-09 NOTE — Telephone Encounter (Signed)
Clarify potassium medication.  You sent Rx to [facility] sig 16mEq TID but you only sent in # 6.  Please advise.

## 2013-10-09 NOTE — Telephone Encounter (Signed)
Spoke to [place] at [facility].  Patient is not currently on potassium and it is documented that he took all of his medications.   It wasn't documented that pt was sick that the 6/16 or 6/17, however med tech believes he wasn't feeling well.  Please fax Rx for potassium to attn Lilia Pro at [place] at [phone].

## 2013-10-09 NOTE — Telephone Encounter (Signed)
Please call daughter of patient. Ask if he is taking lasix & spironolactone. Potassium is too low. These meds should keep it balanced.

## 2013-10-09 NOTE — Telephone Encounter (Signed)
I only want him to take it for 2 days.

## 2013-10-09 NOTE — Telephone Encounter (Signed)
Printed Rx and canceled RX at [facility] printed Rx to [facility] in [place].  Also notified pt's daughter.

## 2013-10-09 NOTE — Telephone Encounter (Signed)
Ralph Dowdy, CMA at 10/09/2013 12:13 PM    Status: Signed        Spoke to [place] at pt's home. Patient is not currently on potassium and it is documented that he took all of his medications. It wasn't documented that pt was sick that the 6/16 or 6/17, however med tech believes he wasn't feeling well. Please fax Rx for potassium to attn Lilia Pro at [place] at [phone].       Ralph Dowdy, CMA at 10/09/2013 11:00 AM     Status: Signed        LMOM for pt's daughter to CB.        Irene Pap, NP at 10/09/2013 10:51 AM     Status: Signed        Please call daughter of patient. Ask if he is taking lasix & spironolactone. Potassium is too low. These meds should keep it balanced.

## 2013-10-09 NOTE — Telephone Encounter (Signed)
Pt's daughter called me back and told me that patient wasn't sick this week but he has felt puny since for awhile.  Pt's daughter would like to be kept up to date about medication changes.

## 2013-10-10 ENCOUNTER — Telehealth: Payer: Self-pay | Admitting: *Deleted

## 2013-10-10 NOTE — Progress Notes (Signed)
Rx faxed by Diane. Diane also spoke to [employer].

## 2013-10-10 NOTE — Telephone Encounter (Signed)
Roselee from [facility] left vm requesting verbal order for PT. They want to continue PT 2x a week for 3-4 weeks. Trying to strengthen pt's legs enough that he can walk to dining area at home. Contact number [phone]). Please advise?

## 2013-10-11 NOTE — Telephone Encounter (Signed)
Yes, verbal order for PT is fine.

## 2013-10-13 NOTE — Telephone Encounter (Signed)
Roselee at [facility] is aware of verbal order for PT.

## 2013-10-15 ENCOUNTER — Encounter (HOSPITAL_COMMUNITY): Payer: Self-pay | Admitting: Emergency Medicine

## 2013-10-15 ENCOUNTER — Inpatient Hospital Stay (HOSPITAL_COMMUNITY)
Admission: EM | Admit: 2013-10-15 | Discharge: 2013-10-17 | DRG: 293 | Disposition: A | Discharge status: Skilled Nursing Facility | Payer: Medicare HMO | Attending: Internal Medicine | Admitting: Internal Medicine

## 2013-10-15 ENCOUNTER — Emergency Department (HOSPITAL_COMMUNITY): Payer: Medicare HMO

## 2013-10-15 DIAGNOSIS — Z96659 Presence of unspecified artificial knee joint: Secondary | ICD-10-CM

## 2013-10-15 DIAGNOSIS — K219 Gastro-esophageal reflux disease without esophagitis: Secondary | ICD-10-CM | POA: Diagnosis present

## 2013-10-15 DIAGNOSIS — I5030 Unspecified diastolic (congestive) heart failure: Principal | ICD-10-CM | POA: Diagnosis present

## 2013-10-15 DIAGNOSIS — K7290 Hepatic failure, unspecified without coma: Secondary | ICD-10-CM | POA: Diagnosis not present

## 2013-10-15 DIAGNOSIS — R55 Syncope and collapse: Secondary | ICD-10-CM

## 2013-10-15 DIAGNOSIS — Z66 Do not resuscitate: Secondary | ICD-10-CM | POA: Diagnosis present

## 2013-10-15 DIAGNOSIS — K7030 Alcoholic cirrhosis of liver without ascites: Secondary | ICD-10-CM | POA: Diagnosis not present

## 2013-10-15 DIAGNOSIS — N401 Enlarged prostate with lower urinary tract symptoms: Secondary | ICD-10-CM | POA: Diagnosis present

## 2013-10-15 DIAGNOSIS — I10 Essential (primary) hypertension: Secondary | ICD-10-CM | POA: Diagnosis not present

## 2013-10-15 DIAGNOSIS — J42 Unspecified chronic bronchitis: Secondary | ICD-10-CM | POA: Diagnosis present

## 2013-10-15 DIAGNOSIS — E785 Hyperlipidemia, unspecified: Secondary | ICD-10-CM | POA: Diagnosis present

## 2013-10-15 DIAGNOSIS — K7682 Hepatic encephalopathy: Secondary | ICD-10-CM | POA: Diagnosis not present

## 2013-10-15 DIAGNOSIS — M129 Arthropathy, unspecified: Secondary | ICD-10-CM | POA: Diagnosis present

## 2013-10-15 DIAGNOSIS — Z96619 Presence of unspecified artificial shoulder joint: Secondary | ICD-10-CM

## 2013-10-15 DIAGNOSIS — Z87891 Personal history of nicotine dependence: Secondary | ICD-10-CM

## 2013-10-15 DIAGNOSIS — H9190 Unspecified hearing loss, unspecified ear: Secondary | ICD-10-CM | POA: Diagnosis present

## 2013-10-15 DIAGNOSIS — E039 Hypothyroidism, unspecified: Secondary | ICD-10-CM | POA: Diagnosis present

## 2013-10-15 DIAGNOSIS — I509 Heart failure, unspecified: Secondary | ICD-10-CM

## 2013-10-15 DIAGNOSIS — I4891 Unspecified atrial fibrillation: Secondary | ICD-10-CM | POA: Diagnosis not present

## 2013-10-15 DIAGNOSIS — N138 Other obstructive and reflux uropathy: Secondary | ICD-10-CM | POA: Diagnosis present

## 2013-10-15 HISTORY — DX: Heart failure, unspecified: I50.9

## 2013-10-15 HISTORY — DX: Malignant neoplasm of prostate: C61

## 2013-10-15 LAB — CREATININE, SERUM
Creatinine, Ser: 0.96 mg/dL (ref 0.50–1.35)
GFR calc Af Amer: 83 mL/min — ABNORMAL LOW (ref >90)
GFR calc non Af Amer: 72 mL/min — ABNORMAL LOW (ref >90)

## 2013-10-15 LAB — COMPREHENSIVE METABOLIC PANEL
ALK PHOS: 175 U/L — AB (ref 39–117)
ALT: 14 U/L (ref 0–53)
AST: 35 U/L (ref 0–37)
Albumin: 1.8 g/dL — ABNORMAL LOW (ref 3.5–5.2)
BILIRUBIN TOTAL: 1 mg/dL (ref 0.3–1.2)
BUN: 15 mg/dL (ref 6–23)
CHLORIDE: 106 meq/L (ref 96–112)
CO2: 27 mEq/L (ref 19–32)
Calcium: 8.4 mg/dL (ref 8.4–10.5)
Creatinine, Ser: 0.91 mg/dL (ref 0.50–1.35)
GFR calc non Af Amer: 73 mL/min — ABNORMAL LOW (ref >90)
GFR, EST AFRICAN AMERICAN: 85 mL/min — AB (ref >90)
GLUCOSE: 74 mg/dL (ref 70–99)
Potassium: 4.8 mEq/L (ref 3.7–5.3)
Sodium: 140 mEq/L (ref 137–147)
TOTAL PROTEIN: 5.4 g/dL — AB (ref 6.0–8.3)

## 2013-10-15 LAB — CBC WITH DIFFERENTIAL/PLATELET
Basophils Absolute: 0.1 10*3/uL (ref 0.0–0.1)
Basophils Relative: 1 % (ref 0–1)
EOS ABS: 0.7 10*3/uL (ref 0.0–0.7)
Eosinophils Relative: 11 % — ABNORMAL HIGH (ref 0–5)
HCT: 31.6 % — ABNORMAL LOW (ref 39.0–52.0)
HEMOGLOBIN: 10.7 g/dL — AB (ref 13.0–17.0)
LYMPHS ABS: 2.3 10*3/uL (ref 0.7–4.0)
Lymphocytes Relative: 35 % (ref 12–46)
MCH: 30.7 pg (ref 26.0–34.0)
MCHC: 33.9 g/dL (ref 30.0–36.0)
MCV: 90.5 fL (ref 78.0–100.0)
MONOS PCT: 11 % (ref 3–12)
Monocytes Absolute: 0.7 10*3/uL (ref 0.1–1.0)
Neutro Abs: 2.9 10*3/uL (ref 1.7–7.7)
Neutrophils Relative %: 42 % — ABNORMAL LOW (ref 43–77)
Platelets: 138 10*3/uL — ABNORMAL LOW (ref 150–400)
RBC: 3.49 MIL/uL — AB (ref 4.22–5.81)
RDW: 14.8 % (ref 11.5–15.5)
WBC: 6.6 10*3/uL (ref 4.0–10.5)

## 2013-10-15 LAB — TROPONIN I: Troponin I: <0.3 ng/mL (ref <0.30)

## 2013-10-15 LAB — URINALYSIS, ROUTINE W REFLEX MICROSCOPIC
Bilirubin Urine: NEGATIVE
GLUCOSE, UA: NEGATIVE mg/dL
HGB URINE DIPSTICK: NEGATIVE
KETONES UR: NEGATIVE mg/dL
Leukocytes, UA: NEGATIVE
Nitrite: NEGATIVE
PROTEIN: NEGATIVE mg/dL
Specific Gravity, Urine: 1.014 (ref 1.005–1.030)
Urobilinogen, UA: 1 mg/dL (ref 0.0–1.0)
pH: 8 (ref 5.0–8.0)

## 2013-10-15 LAB — CBG MONITORING, ED
GLUCOSE-CAPILLARY: 69 mg/dL — AB (ref 70–99)
Glucose-Capillary: 69 mg/dL — ABNORMAL LOW (ref 70–99)

## 2013-10-15 LAB — TSH: TSH: 1.37 u[IU]/mL (ref 0.350–4.500)

## 2013-10-15 LAB — CBC
HEMATOCRIT: 35.2 % — AB (ref 39.0–52.0)
Hemoglobin: 11.8 g/dL — ABNORMAL LOW (ref 13.0–17.0)
MCH: 30.8 pg (ref 26.0–34.0)
MCHC: 33.5 g/dL (ref 30.0–36.0)
MCV: 91.9 fL (ref 78.0–100.0)
Platelets: 172 10*3/uL (ref 150–400)
RBC: 3.83 MIL/uL — ABNORMAL LOW (ref 4.22–5.81)
RDW: 14.9 % (ref 11.5–15.5)
WBC: 8.8 10*3/uL (ref 4.0–10.5)

## 2013-10-15 LAB — GLUCOSE, CAPILLARY: Glucose-Capillary: 81 mg/dL (ref 70–99)

## 2013-10-15 LAB — PRO B NATRIURETIC PEPTIDE: Pro B Natriuretic peptide (BNP): 734.1 pg/mL — ABNORMAL HIGH (ref 0–450)

## 2013-10-15 LAB — AMMONIA: AMMONIA: 61 umol/L — AB (ref 11–60)

## 2013-10-15 LAB — POC OCCULT BLOOD, ED: FECAL OCCULT BLD: POSITIVE — AB

## 2013-10-15 LAB — MAGNESIUM: Magnesium: 2.1 mg/dL (ref 1.5–2.5)

## 2013-10-15 LAB — I-STAT CG4 LACTIC ACID, ED: Lactic Acid, Venous: 1.21 mmol/L (ref 0.5–2.2)

## 2013-10-15 MED ORDER — METOPROLOL TARTRATE 12.5 MG HALF TABLET
12.5000 mg | ORAL_TABLET | Freq: Two times a day (BID) | ORAL | Status: DC
  Administered 2013-10-15 – 2013-10-17 (×4): 12.5 mg via ORAL
  Filled 2013-10-15 (×5): qty 1

## 2013-10-15 MED ORDER — SODIUM CHLORIDE 0.9 % IJ SOLN
3.0000 mL | Freq: Two times a day (BID) | INTRAMUSCULAR | Status: DC
  Administered 2013-10-15 – 2013-10-17 (×4): 3 mL via INTRAVENOUS

## 2013-10-15 MED ORDER — MIRTAZAPINE 30 MG PO TABS
30.0000 mg | ORAL_TABLET | Freq: Every day | ORAL | Status: DC
  Administered 2013-10-15 – 2013-10-16 (×2): 30 mg via ORAL
  Filled 2013-10-15 (×3): qty 1

## 2013-10-15 MED ORDER — ENOXAPARIN SODIUM 40 MG/0.4ML ~~LOC~~ SOLN
40.0000 mg | SUBCUTANEOUS | Status: DC
  Administered 2013-10-15 – 2013-10-16 (×2): 40 mg via SUBCUTANEOUS
  Filled 2013-10-15 (×3): qty 0.4

## 2013-10-15 MED ORDER — ONDANSETRON HCL 4 MG PO TABS: 4.0000 mg | ORAL_TABLET | Freq: Four times a day (QID) | ORAL | Status: DC | PRN

## 2013-10-15 MED ORDER — GUAIFENESIN-DM 100-10 MG/5ML PO SYRP: 5.0000 mL | ORAL_SOLUTION | ORAL | Status: DC | PRN

## 2013-10-15 MED ORDER — FINASTERIDE 5 MG PO TABS
5.0000 mg | ORAL_TABLET | Freq: Every day | ORAL | Status: DC
  Administered 2013-10-15 – 2013-10-16 (×2): 5 mg via ORAL
  Filled 2013-10-15 (×3): qty 1

## 2013-10-15 MED ORDER — ACETAMINOPHEN 325 MG PO TABS: 650.0000 mg | ORAL_TABLET | Freq: Four times a day (QID) | ORAL | Status: DC | PRN

## 2013-10-15 MED ORDER — ASPIRIN EC 81 MG PO TBEC
81.0000 mg | DELAYED_RELEASE_TABLET | Freq: Every day | ORAL | Status: DC
  Administered 2013-10-15 – 2013-10-16 (×2): 81 mg via ORAL
  Filled 2013-10-15 (×3): qty 1

## 2013-10-15 MED ORDER — FUROSEMIDE 10 MG/ML IJ SOLN
40.0000 mg | Freq: Every day | INTRAMUSCULAR | Status: DC
  Administered 2013-10-16: 40 mg via INTRAVENOUS
  Filled 2013-10-15: qty 4

## 2013-10-15 MED ORDER — LEVALBUTEROL HCL 0.63 MG/3ML IN NEBU: 0.6300 mg | INHALATION_SOLUTION | Freq: Four times a day (QID) | RESPIRATORY_TRACT | Status: DC | PRN

## 2013-10-15 MED ORDER — SPIRONOLACTONE 50 MG PO TABS
50.0000 mg | ORAL_TABLET | Freq: Every day | ORAL | Status: DC
  Administered 2013-10-15 – 2013-10-17 (×3): 50 mg via ORAL
  Filled 2013-10-15 (×3): qty 1

## 2013-10-15 MED ORDER — ACETAMINOPHEN 650 MG RE SUPP: 650.0000 mg | Freq: Four times a day (QID) | RECTAL | Status: DC | PRN

## 2013-10-15 MED ORDER — LACTULOSE 10 GM/15ML PO SOLN
30.0000 g | Freq: Every day | ORAL | Status: DC
  Administered 2013-10-15 – 2013-10-17 (×3): 30 g via ORAL
  Filled 2013-10-15 (×3): qty 45

## 2013-10-15 MED ORDER — VITAMIN B-12 1000 MCG PO TABS
1000.0000 ug | ORAL_TABLET | Freq: Every day | ORAL | Status: DC
  Administered 2013-10-15 – 2013-10-17 (×3): 1000 ug via ORAL
  Filled 2013-10-15 (×4): qty 1

## 2013-10-15 MED ORDER — LEVOTHYROXINE SODIUM 100 MCG PO TABS
100.0000 ug | ORAL_TABLET | Freq: Every day | ORAL | Status: DC
  Administered 2013-10-16 – 2013-10-17 (×2): 100 ug via ORAL
  Filled 2013-10-15 (×3): qty 1

## 2013-10-15 MED ORDER — ONDANSETRON HCL 4 MG/2ML IJ SOLN: 4.0000 mg | Freq: Four times a day (QID) | INTRAMUSCULAR | Status: DC | PRN

## 2013-10-15 MED ORDER — PANTOPRAZOLE SODIUM 40 MG PO TBEC
40.0000 mg | DELAYED_RELEASE_TABLET | Freq: Every day | ORAL | Status: DC
  Administered 2013-10-16 – 2013-10-17 (×2): 40 mg via ORAL
  Filled 2013-10-15 (×2): qty 1

## 2013-10-15 MED ORDER — FUROSEMIDE 10 MG/ML IJ SOLN
80.0000 mg | Freq: Once | INTRAMUSCULAR | Status: AC
  Administered 2013-10-15: 80 mg via INTRAVENOUS
  Filled 2013-10-15: qty 8

## 2013-10-15 NOTE — ED Notes (Signed)
CBG 69

## 2013-10-15 NOTE — ED Notes (Signed)
From [facility] assisted living/snf for reported unresponsiveness and periods of apnea, patient DNR, upon ems arrival patient was responsive and vss, nad ntoed, alert and oriented.  Patient denies pain, has wet cough, pitting edema up to hips. Dr. Betsey Holiday at bedside

## 2013-10-15 NOTE — ED Provider Notes (Signed)
CSN: [id number]     Arrival date & time 10/15/13  0932 History   First MD Initiated Contact with Patient 10/15/13 0932     Chief Complaint  Patient presents with  . Loss of Consciousness    resolved     (Consider location/radiation/quality/duration/timing/severity/associated sxs/prior Treatment) HPI Comments: Patient presents to the ER for evaluation after an unresponsive episode. Patient reportedly became unresponsive and had apnea/shortness of breath prior to arrival. EMS were called and by the time they arrived on the scene, patient was awake, alert and oriented. He is brought to the ER for further evaluation. He denies pain and shortness of breath. He does have a cough.  Patient is a 80 y.o. male presenting with syncope.  Loss of Consciousness   Past Medical History  Diagnosis Date  . Arthritis     Shoulders and knees  . Hyperlipidemia   . Hypertension   . A-fib     Dr Wynonia Lawman; WAS on Coumadin until 04/2012  . Angina     uses NTG prn  . Pneumonia     12 /2012.  HC assoc pneumonia/aspiration pneumonia 2015  . Hypothyroidism     x 10 yrs  . GERD (gastroesophageal reflux disease)   . Constipation, chronic   . Depression   . History of dizziness   . Anxiety   . Urinary frequency   . Hearing loss of both ears     wears hearing aides  . BPH with obstruction/lower urinary tract symptoms     sees Dr Gaynelle Arabian  . Compression fracture of thoracic vertebra, non-traumatic 06/14/12    T6 and T7 (noted on CT angio chest to r/o PE)  . Alcoholic cirrhosis of liver with ascites 06/14/12    Noted on CT angio chest to r/o PE  . Esophageal varices in alcoholic cirrhosis "   "    "   "    "  . Intertrochanteric fracture of left hip 05/24/2013  . HCAP (healthcare-associated pneumonia) 07/13/2013  . Postoperative anemia due to acute blood loss 05/27/2013  . CHF (congestive heart failure) 10/15/2013  . Prostate cancer 1992   Past Surgical History  Procedure Laterality Date  . Total knee  arthroplasty Right 1994  . Total shoulder replacement Right 1997  . Cholecystectomy  2004  . Inguinal hernia repair  05/09/2011    Procedure: HERNIA REPAIR INGUINAL ADULT;  Surgeon: Odis Hollingshead, MD;  Location: [facility];  Service: General;  Laterality: Right;  . Femur im nail Left 05/25/2013    Procedure: INTRAMEDULLARY (IM) NAIL FEMORAL;  Surgeon: Johnny Bridge, MD;  Location: [facility];  Service: Orthopedics;  Laterality: Left;  . Femoral hernia repair Right 07/13/2013    Procedure: REPAIR RIGHT INCARCERATED FEMORAL HERNIA WITH MESH;  Surgeon: Stark Klein, MD;  Location: [facility];  Service: General;  Laterality: Right;  . Laparotomy N/A 07/13/2013    Procedure: EXPLORATORY LAPAROTOMY;  Surgeon: Stark Klein, MD;  Location: [facility];  Service: General;  Laterality: N/A;  . Bowel resection N/A 07/13/2013    Procedure: SMALL BOWEL RESECTION;  Surgeon: Stark Klein, MD;  Location: [facility];  Service: General;  Laterality: N/A;  . Joint replacement    . Inguinal hernia repair Left 1990  . Inguinal hernia repair Right 04/2011  . Cataract extraction w/ intraocular lens  implant, bilateral Bilateral 2013  . Transurethral resection of prostate  1992   Family History  Problem Relation Age of Onset  . Alcoholism Father   .  Cirrhosis Father    History  Substance Use Topics  . Smoking status: Former Smoker -- 0.50 packs/day for 35 years    Types: Cigarettes    Quit date: 05/02/1984  . Smokeless tobacco: Current User    Types: Chew  . Alcohol Use: No    Review of Systems  Respiratory: Positive for cough.   Cardiovascular: Positive for syncope.  Neurological: Positive for syncope.  All other systems reviewed and are negative.     Allergies  Amitriptyline; Beta adrenergic blockers; Imdur; Niacin and related; and Sulfa antibiotics  Home Medications   Prior to Admission medications   Medication Sig Start Date End Date Taking? Authorizing Provider  aspirin EC 81 MG tablet Take 81 mg by mouth at  bedtime.   Yes Historical Provider, MD  finasteride (PROSCAR) 5 MG tablet Take 5 mg by mouth at bedtime.  05/01/12  Yes Historical Provider, MD  lactulose (CHRONULAC) 10 GM/15ML solution Take 30 g by mouth daily.   Yes Historical Provider, MD  levothyroxine (SYNTHROID, LEVOTHROID) 100 MCG tablet Take 100 mcg by mouth daily before breakfast.   Yes Historical Provider, MD  metoprolol tartrate (LOPRESSOR) 25 MG tablet Take 0.5 tablets (12.5 mg total) by mouth 2 (two) times daily. 07/21/13  Yes Shanker Kristeen Mans, MD  mirtazapine (REMERON) 30 MG tablet Take 30 mg by mouth at bedtime.  01/04/11  Yes Historical Provider, MD  nitroGLYCERIN (NITROSTAT) 0.4 MG SL tablet Place 0.4 mg under the tongue every 5 (five) minutes as needed for chest pain. Give 1 tablet every 5 minutes for chest pain x 3 doses.  If unrelieved after 3 doses, call MD   Yes Historical Provider, MD  pantoprazole (PROTONIX) 40 MG tablet Take 40 mg by mouth daily.  01/24/11  Yes Historical Provider, MD  spironolactone (ALDACTONE) 25 MG tablet Take 50 mg by mouth daily.   Yes Historical Provider, MD  vitamin B-12 (CYANOCOBALAMIN) 1000 MCG tablet Take 1,000 mcg by mouth daily.   Yes Historical Provider, MD   BP 109/58  Pulse 93  Temp(Src) 98.6 F (37 C) (Oral)  Resp 21  Ht 6' (1.829 m)  Wt 155 lb 3.2 oz (70.398 kg)  BMI 21.04 kg/m2  SpO2 97% Physical Exam  Constitutional: He is oriented to person, place, and time. He appears well-developed and well-nourished. No distress.  HENT:  Head: Normocephalic and atraumatic.  Right Ear: Hearing normal.  Left Ear: Hearing normal.  Nose: Nose normal.  Mouth/Throat: Oropharynx is clear and moist and mucous membranes are normal.  Eyes: Conjunctivae and EOM are normal. Pupils are equal, round, and reactive to light.  Neck: Normal range of motion. Neck supple.  Cardiovascular: Regular rhythm, S1 normal and S2 normal.  Exam reveals no gallop and no friction rub.   No murmur heard. Pulmonary/Chest:  Effort normal. No respiratory distress. He has rales. He exhibits no tenderness.  Abdominal: Soft. Normal appearance and bowel sounds are normal. There is no hepatosplenomegaly. There is no tenderness. There is no rebound, no guarding, no tenderness at McBurney's point and negative Murphy's sign. No hernia.  Musculoskeletal: Normal range of motion. He exhibits edema (Bilateral pitting edema to the hips).  Neurological: He is alert and oriented to person, place, and time. He has normal strength. No cranial nerve deficit or sensory deficit. Coordination normal. GCS eye subscore is 4. GCS verbal subscore is 5. GCS motor subscore is 6.  Skin: Skin is warm, dry and intact. No rash noted. No cyanosis.  Psychiatric: He  has a normal mood and affect. His speech is normal and behavior is normal. Thought content normal.    ED Course  Procedures (including critical care time) Labs Review Labs Reviewed  CBC WITH DIFFERENTIAL - Abnormal; Notable for the following:    RBC 3.49 (*)    Hemoglobin 10.7 (*)    HCT 31.6 (*)    Platelets 138 (*)    Neutrophils Relative % 42 (*)    Eosinophils Relative 11 (*)    All other components within normal limits  COMPREHENSIVE METABOLIC PANEL - Abnormal; Notable for the following:    Total Protein 5.4 (*)    Albumin 1.8 (*)    Alkaline Phosphatase 175 (*)    GFR calc non Af Amer 73 (*)    GFR calc Af Amer 85 (*)    All other components within normal limits  PRO B NATRIURETIC PEPTIDE - Abnormal; Notable for the following:    Pro B Natriuretic peptide (BNP) 734.1 (*)    All other components within normal limits  AMMONIA - Abnormal; Notable for the following:    Ammonia 61 (*)    All other components within normal limits  CREATININE, SERUM - Abnormal; Notable for the following:    GFR calc non Af Amer 72 (*)    GFR calc Af Amer 83 (*)    All other components within normal limits  CBC - Abnormal; Notable for the following:    RBC 3.83 (*)    Hemoglobin 11.8 (*)     HCT 35.2 (*)    All other components within normal limits  BASIC METABOLIC PANEL - Abnormal; Notable for the following:    Sodium 136 (*)    GFR calc non Af Amer 72 (*)    GFR calc Af Amer 83 (*)    All other components within normal limits  GLUCOSE, CAPILLARY - Abnormal; Notable for the following:    Glucose-Capillary 100 (*)    All other components within normal limits  GLUCOSE, CAPILLARY - Abnormal; Notable for the following:    Glucose-Capillary 104 (*)    All other components within normal limits  CBG MONITORING, ED - Abnormal; Notable for the following:    Glucose-Capillary 69 (*)    All other components within normal limits  POC OCCULT BLOOD, ED - Abnormal; Notable for the following:    Fecal Occult Bld POSITIVE (*)    All other components within normal limits  CBG MONITORING, ED - Abnormal; Notable for the following:    Glucose-Capillary 69 (*)    All other components within normal limits  MRSA PCR SCREENING  TROPONIN I  URINALYSIS, ROUTINE W REFLEX MICROSCOPIC  TROPONIN I  TROPONIN I  TROPONIN I  MAGNESIUM  TSH  GLUCOSE, CAPILLARY  URINALYSIS, ROUTINE W REFLEX MICROSCOPIC  I-STAT CG4 LACTIC ACID, ED    Imaging Review Ct Head Wo Contrast  10/15/2013   CLINICAL DATA:  syncope syncope  EXAM: CT HEAD WITHOUT CONTRAST  TECHNIQUE: Contiguous axial images were obtained from the base of the skull through the vertex without intravenous contrast.  COMPARISON:  Head CT dated 01/08/2014  FINDINGS: No acute intracranial abnormality. Specifically, no hemorrhage, hydrocephalus, mass lesion, acute infarction, or significant intracranial injury. The bony calvarium appears intact. Diffuse areas of low attenuation within the subcortical, deep, and periventricular white matter regions consistent small vessel at matter ischemia. Age appropriate global atrophy. Visualized paranasal sinuses and mastoid air cells are patent.  IMPRESSION: Chronic involutional changes.  No acute intracranial  abnormality.   Electronically Signed   By: Margaree Mackintosh M.D.   On: 10/15/2013 12:09   Dg Chest Port 1 View  10/15/2013   CLINICAL DATA:  Edema, history GERD, hypertension, atrial fibrillation, alcoholic cirrhosis with esophageal varices  EXAM: PORTABLE CHEST - 1 VIEW  COMPARISON:  Portable exam 1021 hr compared to 09/03/2013  FINDINGS: Enlargement of cardiac silhouette with pulmonary vascular congestion.  Perihilar and basilar infiltrates likely pulmonary edema.  No gross pleural effusion or pneumothorax.  Bones demineralized.  Prior RIGHT shoulder replacement with LEFT glenohumeral degenerative changes.  Atherosclerotic calcification aorta.  IMPRESSION: BILATERAL pulmonary infiltrates favor pulmonary edema over infection.  Enlargement of cardiac silhouette with pulmonary vascular congestion.   Electronically Signed   By: Lavonia Dana M.D.   On: 10/15/2013 11:12     EKG Interpretation   Date/Time:  Wednesday October 15 2013 09:35:40 EDT Ventricular Rate:  78 PR Interval:  206 QRS Duration: 104 QT Interval:  391 QTC Calculation: 445 R Axis:   -16 Text Interpretation:  Sinus rhythm Borderline left axis deviation Consider  anterior infarct Confirmed by POLLINA  MD, CHRISTOPHER [phone]) on  10/15/2013 9:42:14 AM      MDM   Final diagnoses:  None   syncope CHF  Brought to the emergency department after a syncopal episode. Patient was noted to be in some respiratory distress during the episode. He did not receive CPR. EMS reported that he was awake and alert by the time they reached him. Workup today has revealed bilateral opacities consistent with pulmonary edema. Patient does have significant pitting edema of most of the body. Some of this is related to cirrhosis, but he has some evidence of congestive heart failure as well. This administered Lasix, will be admitted to the hospital for further management    Orpah Greek, MD 10/16/13 [phone]

## 2013-10-15 NOTE — ED Notes (Signed)
Patient turned on right side.

## 2013-10-15 NOTE — ED Notes (Signed)
Patient turned

## 2013-10-15 NOTE — ED Notes (Signed)
Results of lactic acid given to Dr. Betsey Holiday

## 2013-10-15 NOTE — ED Notes (Signed)
Patient transported to CT 

## 2013-10-15 NOTE — Evaluation (Addendum)
Clinical/Bedside Swallow Evaluation Patient Details  Name: Raymond Cominsky Sr. MRN: [id number] Date of Birth: Sep 19, 1922  Today's Date: 10/15/2013 Time: 5956-3875 SLP Time Calculation (min): 56 min  Past Medical History:  Past Medical History  Diagnosis Date  . Arthritis     Shoulders and knees  . Hyperlipidemia   . Hypertension   . A-fib     Dr Wynonia Lawman; WAS on Coumadin until 04/2012  . Angina     uses NTG prn  . Pneumonia     12 /2012.  HC assoc pneumonia/aspiration pneumonia 2015  . Hypothyroidism     x 10 yrs  . GERD (gastroesophageal reflux disease)   . Constipation, chronic   . Depression   . History of dizziness   . Anxiety   . Urinary frequency   . Hearing loss of both ears     wears hearing aides  . BPH with obstruction/lower urinary tract symptoms     sees Dr Gaynelle Arabian  . Compression fracture of thoracic vertebra, non-traumatic 06/14/12    T6 and T7 (noted on CT angio chest to r/o PE)  . Alcoholic cirrhosis of liver with ascites 06/14/12    Noted on CT angio chest to r/o PE  . Esophageal varices in alcoholic cirrhosis "   "    "   "    "  . Intertrochanteric fracture of left hip 05/24/2013  . HCAP (healthcare-associated pneumonia) 07/13/2013  . Postoperative anemia due to acute blood loss 05/27/2013  . CHF (congestive heart failure) 10/15/2013   Past Surgical History:  Past Surgical History  Procedure Laterality Date  . Total knee arthroplasty  1994    right  . Total shoulder replacement  1997    right  . Joint replacement      Rt knee and shoulder  . Hernia repair  1990    LIH repair  . Cholecystectomy  2004  . Inguinal hernia repair  05/09/2011    Procedure: HERNIA REPAIR INGUINAL ADULT;  Surgeon: Odis Hollingshead, MD;  Location: [place];  Service: General;  Laterality: Right;  . Femur im nail Left 05/25/2013    Procedure: INTRAMEDULLARY (IM) NAIL FEMORAL;  Surgeon: Johnny Bridge, MD;  Location: [place];  Service: Orthopedics;  Laterality: Left;  . Femoral hernia  repair Right 07/13/2013    Procedure: REPAIR RIGHT INCARCERATED FEMORAL HERNIA WITH MESH;  Surgeon: Stark Klein, MD;  Location: [place];  Service: General;  Laterality: Right;  . Laparotomy N/A 07/13/2013    Procedure: EXPLORATORY LAPAROTOMY;  Surgeon: Stark Klein, MD;  Location: [place];  Service: General;  Laterality: N/A;  . Bowel resection N/A 07/13/2013    Procedure: SMALL BOWEL RESECTION;  Surgeon: Stark Klein, MD;  Location: [place];  Service: General;  Laterality: N/A;   HPI: 80 year old male admitted with confusion and SOB following an apneic episode lasting several minutes.PMH of chronic dysphagia with h/o aspiration pneumonia, with suspected primary esophageal dysphagia, moderate oral, mild pharyngeal, and moderate cervical dysphagia with reduced UES opening and Zenker's diverticulum.    Assessment / Plan / Recommendation Clinical Impression      Aspiration Risk  Moderate    Diet Recommendation Dysphagia 2 (Fine chop);Thin liquid   Liquid Administration via: Cup;No straw Medication Administration: Whole meds with puree Supervision: Patient able to self feed;Staff to assist with self feeding;Full supervision/cueing for compensatory strategies Compensations: Slow rate;Small sips/bites;Follow solids with liquid;Clear throat intermittently Postural Changes and/or Swallow Maneuvers: Seated upright 90 degrees;Upright 30-60 min after  meal    Other  Recommendations Oral Care Recommendations: Oral care BID Other Recommendations: Clarify dietary restrictions   Follow Up Recommendations  Skilled Nursing facility    Frequency and Duration min 2x/week  2 weeks   Pertinent Vitals/Pain Afebrile; CXR: pulmonary infiltrates favor edema over infection        Swallow Study Prior Functional Status       General HPI: 80 y.o. Caucasian male with history of hypertension, hyperlipidemia, A. fib not on anticoagulation due to bleeding in 2014, history of pneumonia, hypothyroidism, GERD,  constipation, depression, history of alcoholic cirrhosis, and history of left hip fracture in January of 2015 status post surgery currently at [facility] rehabilitation  , [place] [facility] after an unresponsive episode described as apnea lasting several minutes. This was associated with shortness of breath subsequently. CBG was 69 at the time of the episode.. Patient appears to be slightly confused, ammonia level was 61. Has a nonproductive cough which he claims is chronic. The patient denies any chest pain. Workup in the ER was essentially negative. No focal weakness, no strokelike symptoms . CT scan of the head was negative. No recent history of fever, diarrhea. Chest x-ray concerning for interstitial pulmonary edema versus infection. Patient was also found to have generalized anasarca and 2+ pitting edema.  H/O chronic dysphagia and recurrent pneumonia. Type of Study: Bedside swallow evaluation Previous Swallow Assessment: Last MBS 03/05/13: Moderate oral, mild pharyngeal, and moderate cervical esophageal dysphagia.  Dysphagia 2 with thin liquids and compensatory strategies recommended. Diet Prior to this Study: NPO Temperature Spikes Noted: No Respiratory Status: Room air History of Recent Intubation: No Behavior/Cognition: Alert;Cooperative;Confused;Distractible;Requires cueing;Hard of hearing Oral Cavity - Dentition: Edentulous;Dentures, not available Self-Feeding Abilities: Needs assist;Needs set up Patient Positioning: Upright in bed Baseline Vocal Quality: Wet;Low vocal intensity Volitional Cough: Weak Volitional Swallow: Able to elicit    Oral/Motor/Sensory Function Overall Oral Motor/Sensory Function: Appears within functional limits for tasks assessed   Ice Chips Ice chips: Within functional limits Presentation: Spoon   Thin Liquid Thin Liquid: Within functional limits Presentation: Spoon;Cup    Nectar Thick Nectar Thick Liquid: Not tested   Honey Thick Honey  Thick Liquid: Not tested   Puree Puree: Within functional limits Presentation: Spoon   Solid   GO    Solid: Not tested ("Too dry")       Quinn Axe T 10/15/2013,5:27 PM

## 2013-10-15 NOTE — Progress Notes (Signed)
[facility]  Patient Status: Active (receiving services up to time of hospitalization)  [facility] is providing the following services: RN and PT  If patient discharges after hours, please call [phone].   Raymond Larsen 10/15/2013, 5:14 PM

## 2013-10-15 NOTE — H&P (Addendum)
Raymond Morrow Sr. MRN: [id number] DOB/AGE: 1922/09/07 80 y.o. Primary Care Physician:MCGOWEN,PHILIP H, MD Admit date: 10/15/2013 Chief Complaint: Syncope  HPI: 80 y.o. Caucasian male with history of hypertension, hyperlipidemia, A. fib not on anticoagulation due to bleeding in 2014, history of pneumonia, hypothyroidism, GERD, constipation, depression, history of alcoholic cirrhosis, and history of left hip fracture in January of 2015 status post surgery currently at [facility] rehabilitation  , [place] [facility] after an unresponsive episode described as apnea lasting several minutes. This was associated with shortness of breath subsequently. CBG was 69 at the time of the episode.. Patient appears to be slightly confused, ammonia level was 61. Has a nonproductive cough which he claims is chronic. The patient denies any chest pain. Workup in the ER was essentially negative. No focal weakness, no strokelike symptoms . CT scan of the head was negative. No recent history of fever, diarrhea. Chest x-ray concerning for interstitial pulmonary edema versus infection. Patient was also found to have generalized anasarca and 2+ pitting edema.  Patient was recently hospitalized from 5/13-5/21, surgical site infection of recently operated incarcerated inguinal hernia repair in March 2015 treatment antibiotics. He was also treated for hepatic encephalopathy during that admission.  Past Medical History  Diagnosis Date  . Arthritis     Shoulders and knees  . Hyperlipidemia   . Hypertension   . A-fib     Dr Wynonia Lawman; WAS on Coumadin until 04/2012  . Angina     uses NTG prn  . Pneumonia     12 /2012.  HC assoc pneumonia/aspiration pneumonia 2015  . Hypothyroidism     x 10 yrs  . GERD (gastroesophageal reflux disease)   . Constipation, chronic   . Depression   . History of dizziness   . Anxiety   . Urinary frequency   . Hearing loss of both ears     wears hearing aides  . BPH with  obstruction/lower urinary tract symptoms     sees Dr Gaynelle Arabian  . Compression fracture of thoracic vertebra, non-traumatic 06/14/12    T6 and T7 (noted on CT angio chest to r/o PE)  . Alcoholic cirrhosis of liver with ascites 06/14/12    Noted on CT angio chest to r/o PE  . Esophageal varices in alcoholic cirrhosis "   "    "   "    "  . Intertrochanteric fracture of left hip 05/24/2013  . HCAP (healthcare-associated pneumonia) 07/13/2013  . Postoperative anemia due to acute blood loss 05/27/2013  . CHF (congestive heart failure) 10/15/2013    Past Surgical History  Procedure Laterality Date  . Total knee arthroplasty  1994    right  . Total shoulder replacement  1997    right  . Joint replacement      Rt knee and shoulder  . Hernia repair  1990    LIH repair  . Cholecystectomy  2004  . Inguinal hernia repair  05/09/2011    Procedure: HERNIA REPAIR INGUINAL ADULT;  Surgeon: Odis Hollingshead, MD;  Location: [place];  Service: General;  Laterality: Right;  . Femur im nail Left 05/25/2013    Procedure: INTRAMEDULLARY (IM) NAIL FEMORAL;  Surgeon: Johnny Bridge, MD;  Location: [place];  Service: Orthopedics;  Laterality: Left;  . Femoral hernia repair Right 07/13/2013    Procedure: REPAIR RIGHT INCARCERATED FEMORAL HERNIA WITH MESH;  Surgeon: Stark Klein, MD;  Location: [place];  Service: General;  Laterality: Right;  . Laparotomy N/A  07/13/2013    Procedure: EXPLORATORY LAPAROTOMY;  Surgeon: Stark Klein, MD;  Location: [place];  Service: General;  Laterality: N/A;  . Bowel resection N/A 07/13/2013    Procedure: SMALL BOWEL RESECTION;  Surgeon: Stark Klein, MD;  Location: [place];  Service: General;  Laterality: N/A;    Prior to Admission medications   Medication Sig Start Date End Date Taking? Authorizing Provider  aspirin EC 81 MG tablet Take 81 mg by mouth at bedtime.   Yes Historical Provider, MD  finasteride (PROSCAR) 5 MG tablet Take 5 mg by mouth at bedtime.  05/01/12  Yes Historical  Provider, MD  lactulose (CHRONULAC) 10 GM/15ML solution Take 30 g by mouth daily.   Yes Historical Provider, MD  levothyroxine (SYNTHROID, LEVOTHROID) 100 MCG tablet Take 100 mcg by mouth daily before breakfast.   Yes Historical Provider, MD  metoprolol tartrate (LOPRESSOR) 25 MG tablet Take 0.5 tablets (12.5 mg total) by mouth 2 (two) times daily. 07/21/13  Yes Shanker Kristeen Mans, MD  mirtazapine (REMERON) 30 MG tablet Take 30 mg by mouth at bedtime.  01/04/11  Yes Historical Provider, MD  nitroGLYCERIN (NITROSTAT) 0.4 MG SL tablet Place 0.4 mg under the tongue every 5 (five) minutes as needed for chest pain. Give 1 tablet every 5 minutes for chest pain x 3 doses.  If unrelieved after 3 doses, call MD   Yes Historical Provider, MD  pantoprazole (PROTONIX) 40 MG tablet Take 40 mg by mouth daily.  01/24/11  Yes Historical Provider, MD  spironolactone (ALDACTONE) 25 MG tablet Take 50 mg by mouth daily.   Yes Historical Provider, MD  vitamin B-12 (CYANOCOBALAMIN) 1000 MCG tablet Take 1,000 mcg by mouth daily.   Yes Historical Provider, MD    Allergies:  Allergies  Allergen Reactions  . Amitriptyline Other (See Comments)    Dizziness   . Beta Adrenergic Blockers Other (See Comments)    lethargy  . Imdur [Isosorbide]     Headache   . Niacin And Related Rash       . Sulfa Antibiotics Itching, Nausea And Vomiting and Rash    Family History  Problem Relation Age of Onset  . Alcoholism Father   . Cirrhosis Father     Social History:  reports that he quit smoking about 29 years ago. His smoking use included Cigarettes. He has a 17.5 pack-year smoking history. His smokeless tobacco use includes Chew. He reports that he does not drink alcohol or use illicit drugs.  Review of Systems  Respiratory: Positive for cough.  Cardiovascular: Positive for syncope.  Neurological: Positive for syncope.  All other systems reviewed and are negative.   PHYSICAL EXAM: Blood pressure 113/64, pulse 75,  temperature 97.8 F (36.6 C), temperature source Rectal, resp. rate 13, height 5' 9"  (1.753 m), weight 81.647 kg (180 lb), SpO2 95.00%. Constitutional: He is oriented to person, place, and time. He appears well-developed and well-nourished. No distress.  HENT:  Head: Normocephalic and atraumatic.  Right Ear: Hearing normal.  Left Ear: Hearing normal.  Nose: Nose normal.  Mouth/Throat: Oropharynx is clear and moist and mucous membranes are normal.  Eyes: Conjunctivae and EOM are normal. Pupils are equal, round, and reactive to light.  Neck: Normal range of motion. Neck supple.  Cardiovascular: Regular rhythm, S1 normal and S2 normal. Exam reveals no gallop and no friction rub.  No murmur heard.  Pulmonary/Chest: Effort normal. No respiratory distress. He has rales. He exhibits no tenderness.  Abdominal: Soft. Normal appearance and bowel  sounds are normal. There is no hepatosplenomegaly. There is no tenderness. There is no rebound, no guarding, no tenderness at McBurney's point and negative Murphy's sign. No hernia.  Musculoskeletal: Normal range of motion. He exhibits edema (Bilateral pitting edema to the hips).  Neurological: He is alert and oriented to person, place, and time. He has normal strength. No cranial nerve deficit or sensory deficit. Coordination normal. GCS eye subscore is 4. GCS verbal subscore is 5. GCS motor subscore is 6.  Skin: Skin is warm, dry and intact. No rash noted. No cyanosis.  Psychiatric: He has a normal mood and affect. His speech is normal and behavior is normal. Thought content normal.    No results found for this or any previous visit (from the past 240 hour(s)).   Results for orders placed during the hospital encounter of 10/15/13 (from the past 48 hour(s))  CBC WITH DIFFERENTIAL     Status: Abnormal   Collection Time    10/15/13  9:44 AM      Result Value Ref Range   WBC 6.6  4.0 - 10.5 K/uL   RBC 3.49 (*) 4.22 - 5.81 MIL/uL   Hemoglobin 10.7 (*) 13.0 -  17.0 g/dL   HCT 31.6 (*) 39.0 - 52.0 %   MCV 90.5  78.0 - 100.0 fL   MCH 30.7  26.0 - 34.0 pg   MCHC 33.9  30.0 - 36.0 g/dL   RDW 14.8  11.5 - 15.5 %   Platelets 138 (*) 150 - 400 K/uL   Neutrophils Relative % 42 (*) 43 - 77 %   Neutro Abs 2.9  1.7 - 7.7 K/uL   Lymphocytes Relative 35  12 - 46 %   Lymphs Abs 2.3  0.7 - 4.0 K/uL   Monocytes Relative 11  3 - 12 %   Monocytes Absolute 0.7  0.1 - 1.0 K/uL   Eosinophils Relative 11 (*) 0 - 5 %   Eosinophils Absolute 0.7  0.0 - 0.7 K/uL   Basophils Relative 1  0 - 1 %   Basophils Absolute 0.1  0.0 - 0.1 K/uL  COMPREHENSIVE METABOLIC PANEL     Status: Abnormal   Collection Time    10/15/13  9:44 AM      Result Value Ref Range   Sodium 140  137 - 147 mEq/L   Potassium 4.8  3.7 - 5.3 mEq/L   Chloride 106  96 - 112 mEq/L   CO2 27  19 - 32 mEq/L   Glucose, Bld 74  70 - 99 mg/dL   BUN 15  6 - 23 mg/dL   Creatinine, Ser 0.91  0.50 - 1.35 mg/dL   Calcium 8.4  8.4 - 10.5 mg/dL   Total Protein 5.4 (*) 6.0 - 8.3 g/dL   Albumin 1.8 (*) 3.5 - 5.2 g/dL   AST 35  0 - 37 U/L   ALT 14  0 - 53 U/L   Alkaline Phosphatase 175 (*) 39 - 117 U/L   Total Bilirubin 1.0  0.3 - 1.2 mg/dL   GFR calc non Af Amer 73 (*) >90 mL/min   GFR calc Af Amer 85 (*) >90 mL/min   Comment: (NOTE)     The eGFR has been calculated using the CKD EPI equation.     This calculation has not been validated in all clinical situations.     eGFR's persistently <90 mL/min signify possible Chronic Kidney     Disease.  CBG MONITORING, ED  Status: Abnormal   Collection Time    10/15/13 10:03 AM      Result Value Ref Range   Glucose-Capillary 69 (*) 70 - 99 mg/dL   Comment 1 Documented in Chart     Comment 2 Notify RN    TROPONIN I     Status: None   Collection Time    10/15/13 10:12 AM      Result Value Ref Range   Troponin I <0.30  <0.30 ng/mL   Comment:            Due to the release kinetics of cTnI,     a negative result within the first hours     of the onset of  symptoms does not rule out     myocardial infarction with certainty.     If myocardial infarction is still suspected,     repeat the test at appropriate intervals.  PRO B NATRIURETIC PEPTIDE     Status: Abnormal   Collection Time    10/15/13 10:12 AM      Result Value Ref Range   Pro B Natriuretic peptide (BNP) 734.1 (*) 0 - 450 pg/mL  POC OCCULT BLOOD, ED     Status: Abnormal   Collection Time    10/15/13 10:18 AM      Result Value Ref Range   Fecal Occult Bld POSITIVE (*) NEGATIVE  URINALYSIS, ROUTINE W REFLEX MICROSCOPIC     Status: None   Collection Time    10/15/13 10:20 AM      Result Value Ref Range   Color, Urine YELLOW  YELLOW   APPearance CLEAR  CLEAR   Specific Gravity, Urine 1.014  1.005 - 1.030   pH 8.0  5.0 - 8.0   Glucose, UA NEGATIVE  NEGATIVE mg/dL   Hgb urine dipstick NEGATIVE  NEGATIVE   Bilirubin Urine NEGATIVE  NEGATIVE   Ketones, ur NEGATIVE  NEGATIVE mg/dL   Protein, ur NEGATIVE  NEGATIVE mg/dL   Urobilinogen, UA 1.0  0.0 - 1.0 mg/dL   Nitrite NEGATIVE  NEGATIVE   Leukocytes, UA NEGATIVE  NEGATIVE   Comment: MICROSCOPIC NOT DONE ON URINES WITH NEGATIVE PROTEIN, BLOOD, LEUKOCYTES, NITRITE, OR GLUCOSE <1000 mg/dL.  I-STAT CG4 LACTIC ACID, ED     Status: None   Collection Time    10/15/13 10:23 AM      Result Value Ref Range   Lactic Acid, Venous 1.21  0.5 - 2.2 mmol/L  CBG MONITORING, ED     Status: Abnormal   Collection Time    10/15/13 12:57 PM      Result Value Ref Range   Glucose-Capillary 69 (*) 70 - 99 mg/dL   Comment 1 Documented in Chart     Comment 2 Notify RN    AMMONIA     Status: Abnormal   Collection Time    10/15/13  1:54 PM      Result Value Ref Range   Ammonia 61 (*) 11 - 60 umol/L    Ct Head Wo Contrast  10/15/2013   CLINICAL DATA:  syncope syncope  EXAM: CT HEAD WITHOUT CONTRAST  TECHNIQUE: Contiguous axial images were obtained from the base of the skull through the vertex without intravenous contrast.  COMPARISON:  Head CT  dated 01/08/2014  FINDINGS: No acute intracranial abnormality. Specifically, no hemorrhage, hydrocephalus, mass lesion, acute infarction, or significant intracranial injury. The bony calvarium appears intact. Diffuse areas of low attenuation within the subcortical, deep, and periventricular white matter regions  consistent small vessel at matter ischemia. Age appropriate global atrophy. Visualized paranasal sinuses and mastoid air cells are patent.  IMPRESSION: Chronic involutional changes.  No acute intracranial abnormality.   Electronically Signed   By: Margaree Mackintosh M.D.   On: 10/15/2013 12:09   Dg Chest Port 1 View  10/15/2013   CLINICAL DATA:  Edema, history GERD, hypertension, atrial fibrillation, alcoholic cirrhosis with esophageal varices  EXAM: PORTABLE CHEST - 1 VIEW  COMPARISON:  Portable exam 1021 hr compared to 09/03/2013  FINDINGS: Enlargement of cardiac silhouette with pulmonary vascular congestion.  Perihilar and basilar infiltrates likely pulmonary edema.  No gross pleural effusion or pneumothorax.  Bones demineralized.  Prior RIGHT shoulder replacement with LEFT glenohumeral degenerative changes.  Atherosclerotic calcification aorta.  IMPRESSION: BILATERAL pulmonary infiltrates favor pulmonary edema over infection.  Enlargement of cardiac silhouette with pulmonary vascular congestion.   Electronically Signed   By: Lavonia Dana M.D.   On: 10/15/2013 11:12    Impression:  Active Problems:   CHF (congestive heart failure)   Syncope and collapse     Plan: Syncope-cardiogenic? history of atrial fibrillation, currently in sinus rhythm vs secondary to hypoglycemia Troponin negative x1 CT scan is negative, although possible TIA cannot be ruled out given history of atrial fibrillation Obtain a  2-D echo Cycle cardiac enzymes, continue aspirin Not a Coumadin candidate, discontinued January 2014, do to hematuria Continue Accu-Cheks for hypoglycemia  Bilateral pulmonary edema X-ray  shows moderate vascular congestion, doubt infection Diurese with IV Lasix Patient does have a cough that appears to be chronic No indication for empiric antibiotics at this time  Atrial fibrillation Continue metoprolol Rate controlled Currently in sinus  Hypothyroidism continue Synthroid, most recent TSH was 6.22 will repeat  Cirrhosis of the liver with ascites and anasarca Diurese with IV Lasix and continue Aldactone Continue lactulose, ammonia level is 61  Disposition admit for observation overnight CODE STATUS DO NOT RESUSCITATE     Antonia Jicha 10/15/2013, 3:02 PM

## 2013-10-15 NOTE — ED Notes (Signed)
Patient turned on left side.

## 2013-10-16 DIAGNOSIS — I359 Nonrheumatic aortic valve disorder, unspecified: Secondary | ICD-10-CM

## 2013-10-16 LAB — BASIC METABOLIC PANEL
BUN: 19 mg/dL (ref 6–23)
BUN: 21 mg/dL (ref 6–23)
CHLORIDE: 102 meq/L (ref 96–112)
CO2: 23 mEq/L (ref 19–32)
CO2: 26 meq/L (ref 19–32)
CREATININE: 1.12 mg/dL (ref 0.50–1.35)
Calcium: 8.4 mg/dL (ref 8.4–10.5)
Calcium: 8 mg/dL — ABNORMAL LOW (ref 8.4–10.5)
Chloride: 102 mEq/L (ref 96–112)
Creatinine, Ser: 0.97 mg/dL (ref 0.50–1.35)
GFR calc Af Amer: 66 mL/min — ABNORMAL LOW (ref >90)
GFR calc non Af Amer: 72 mL/min — ABNORMAL LOW (ref >90)
GFR calc non Af Amer: 57 mL/min — ABNORMAL LOW (ref >90)
GFR, EST AFRICAN AMERICAN: 83 mL/min — AB (ref >90)
GLUCOSE: 125 mg/dL — AB (ref 70–99)
Glucose, Bld: 93 mg/dL (ref 70–99)
POTASSIUM: 4.9 meq/L (ref 3.7–5.3)
Potassium: 4.6 mEq/L (ref 3.7–5.3)
SODIUM: 137 meq/L (ref 137–147)
Sodium: 136 mEq/L — ABNORMAL LOW (ref 137–147)

## 2013-10-16 LAB — GLUCOSE, CAPILLARY
GLUCOSE-CAPILLARY: 104 mg/dL — AB (ref 70–99)
Glucose-Capillary: 100 mg/dL — ABNORMAL HIGH (ref 70–99)
Glucose-Capillary: 106 mg/dL — ABNORMAL HIGH (ref 70–99)
Glucose-Capillary: 114 mg/dL — ABNORMAL HIGH (ref 70–99)

## 2013-10-16 LAB — TROPONIN I: Troponin I: <0.3 ng/mL (ref <0.30)

## 2013-10-16 LAB — MRSA PCR SCREENING: MRSA by PCR: NEGATIVE

## 2013-10-16 MED ORDER — FUROSEMIDE 40 MG PO TABS
40.0000 mg | ORAL_TABLET | Freq: Every day | ORAL | Status: DC
  Administered 2013-10-16 – 2013-10-17 (×2): 40 mg via ORAL
  Filled 2013-10-16 (×2): qty 1

## 2013-10-16 NOTE — Clinical Social Work Placement (Addendum)
    Clinical Social Work Department CLINICAL SOCIAL WORK PLACEMENT NOTE 10/17/2013  Patient:  GERMANY, CHELF  Account Number:  [id number] Admit date:  10/15/2013  Clinical Social Worker:  Butch Penny CROWDER, LCSWA  Date/time:  10/16/2013 04:20 PM  Clinical Social Work is seeking post-discharge placement for this patient at the following level of care:   SKILLED NURSING   (*CSW will update this form in Epic as items are completed)   10/16/2013  Patient/family provided with [place] Department of Clinical Social Work's list of facilities offering this level of care within the geographic area requested by the patient (or if unable, by the patient's family).  10/16/2013  Patient/family informed of their freedom to choose among providers that offer the needed level of care, that participate in Medicare, Medicaid or managed care program needed by the patient, have an available bed and are willing to accept the patient.  10/16/2013  Patient/family informed of [facility]' ownership interest in [facility], as well as of the fact that they are under no obligation to receive care at this facility.  PASARR submitted to EDS on  PASARR number received on   FL2 transmitted to all facilities in geographic area requested by pt/family on  10/17/2013 FL2 transmitted to all facilities within larger geographic area on   Patient informed that his/her managed care company has contracts with or will negotiate with  certain facilities, including the following:   Has existing PASARR  Will require [employer] authorization  ([employer])- Cephus Richer     Patient/family informed of bed offers received:  10/17/2013 Patient chooses bed at [facility] Physician recommends and patient chooses bed at    Patient to be transferred to [facility] on  10/17/2013 Patient to be transferred to facility by [facility]) Patient and family notified of transfer on 10/17/2013 Name of  family member notified:  Delsa Sale- Daughter  The following physician request were entered in Epic:   Additional Comments: 10/16/13 Daughter does not want patient to go to [facility]. 10/17/13  Bed offers provided to daughter who stated that she and her sisters were still out of town. She was concerned that she and her siblings would not be able to tour and check facilities offered and did not feel comfortable with sending their father without checking them first. The daughters eventually agreed to allow patient to return to [facility] as he has been there in the past- however, they plan to tour [facility] when they return next week to determine if this is possibly a more appropriate facility for their father. CSW also spoke to Chad from [facility] per request of daughter.  Patient's condition was reviewed and she stated that she could not accept patient back with current medical needs (such as Dysphagie 2 diet)  Patient would benefit from SNF placement.  CSW provided support to patient's daughter and acknowledged her frustration over her father's discharge. Nursing notified to call report to [facility]; facility is aware of d/c.  [place] signing off.

## 2013-10-16 NOTE — Progress Notes (Signed)
Echocardiogram 2D Echocardiogram has been performed.  BROWN, CALEBA 10/16/2013, 10:25 AM

## 2013-10-16 NOTE — Progress Notes (Signed)
Utilization Review Completed Camellia J. Wood, RN, BSN, NCM [phone]  

## 2013-10-16 NOTE — Clinical Social Work Psychosocial (Addendum)
     Clinical Social Work Department BRIEF PSYCHOSOCIAL ASSESSMENT 10/16/2013  Patient:  Raymond Larsen, Raymond Larsen     Account Number:  [id number]     Admit date:  10/15/2013  Clinical Social Worker:  Iona Coach  Date/Time:  10/16/2013 03:47 PM  Referred by:  Physician  Date Referred:  10/16/2013 Referred for  SNF Placement   Other Referral:   Interview type:  Other - See comment Other interview type:   Patient and telephone call to daughter Raymond Larsen who is out of town    PSYCHOSOCIAL DATA Living Status:  FACILITY Admitted from facility:  Other Level of care:  Assisted Living Primary support name:  Raymond Larsen  [phone] Primary support relationship to patient:  CHILD, ADULT Degree of support available:   Strong support    CURRENT CONCERNS Current Concerns  Post-Acute Placement   Other Concerns:   Increase level of care    SOCIAL WORK ASSESSMENT / PLAN 80 year old male- resident of [facility]. CSW met with patient who stated that he has been a resident there for about 5 months.He stated that he is happy at the ALF and hopes to return there.  Speech Therapy has evauated patient and are recommending SNF.  Awaiting PT evaluation but anticipate SNF recommendation. CSW spoke with patient who is agreeable to rehab and has been a resident in the recent past at [facility].  He asked CSW to contact his daughter Raymond.  CSW spoke to daughter who is currently away on a brief vacation. She confirmed about and asked if patient could go to [place] as her mother-in-law is a resident there. [facility] does not accept patient's [employer]. She will talk to her sisters but would agree to return to [facility] for further rehab. Fl2 completed and will be placed on chart for MD's signature.  Active bed search is in place. Daughter states that patient has been in the hospital 6 months since November, 2014 as he has not been sucessful at [facility].    Assessment/plan status:  Psychosocial Support/Ongoing Assessment of Needs Other assessment/ plan:   Information/referral to community resources:   SNF bed list left in patient's room. Discussed with daughter    PATIENTS/FAMILYS RESPONSE TO PLAN OF CARE: Patient is alert and oriented; very pleasant gentleman who states that he is feeling a little better but still feels very weak.  Patient defers to his daughters about his d/c plan but is agreeable to SNF placement if indicated. He has been in a SNF in the recent past.  Patient denied any current concerns or questions. Patient's daughter is agreeable to SNF search but asked that he not return to [facility]. She agrees to full SNF search all other SNF's with preference for [facility].  CSW will initiate search.

## 2013-10-16 NOTE — Progress Notes (Signed)
Speech Language Pathology Treatment: Dysphagia  Patient Details Name: Raymond Costilla Sr. MRN: [id number] DOB: May 06, 1922 Today's Date: 10/16/2013 Time: 1027-2536 SLP Time Calculation (min): 15 min  Assessment / Plan / Recommendation Clinical Impression  Treatment today consisted of skilled observation of POs and pt education. Pt initially did not demonstrate any s/s of aspiration at bedside; at the end of the session however with very last sip of thin liquid pt had an immediate cough. Note pt's previous MBS in 02/2013 indicating pharyngeal stasis primarily d/t decreased UES opening. Cough at end of evaluation may again indicate esophageal dysfunction causing increased pharyngeal stasis as meals progress. NT reported no difficulties during breakfast. Given this, would recommend continuing dysphagia 2/ thin liquid diet with precautions- small bites/ sips at a time, alternate food w/ liquid, sit upright 30-60 mins after meal to increase esophageal motility. ST will continue to follow for diet tolerance.    HPI HPI: 80 y.o. Caucasian male with history of hypertension, hyperlipidemia, A. fib not on anticoagulation due to bleeding in 2014, history of pneumonia, hypothyroidism, GERD, constipation, depression, history of alcoholic cirrhosis, and history of left hip fracture in January of 2015 status post surgery currently at [facility]  , [place] [facility] after an unresponsive episode described as apnea lasting several minutes. This was associated with shortness of breath subsequently. CBG was 69 at the time of the episode.. Patient appears to be slightly confused, ammonia level was 61. Has a nonproductive cough which he claims is chronic. The patient denies any chest pain. Workup in the ER was essentially negative. No focal weakness, no strokelike symptoms . CT scan of the head was negative. No recent history of fever, diarrhea. Chest x-ray concerning for interstitial pulmonary  edema versus infection. Patient was also found to have generalized anasarca and 2+ pitting edema.  H/O chronic dysphagia and recurrent pneumonia.   Pertinent Vitals n/a  SLP Plan  Continue with current plan of care    Recommendations Diet recommendations: Dysphagia 2 (fine chop);Thin liquid Liquids provided via: Cup Medication Administration: Whole meds with puree Supervision: Patient able to self feed;Staff to assist with self feeding;Full supervision/cueing for compensatory strategies Compensations: Slow rate;Small sips/bites;Follow solids with liquid;Clear throat intermittently Postural Changes and/or Swallow Maneuvers: Seated upright 90 degrees;Upright 30-60 min after meal              Oral Care Recommendations: Oral care BID Follow up Recommendations: Skilled Nursing facility Plan: Continue with current plan of care    Raymond Larsen, Raymond Larsen 10/16/2013, 12:10 PM

## 2013-10-16 NOTE — Progress Notes (Signed)
[facility] PROGRESS NOTE  Raymond Bean Sr. BMW:[id number] DOB: 1922-09-09 DOA: 10/15/2013 PCP: Tammi Sou, MD  Assessment/Plan: Active Problems:   CHF (congestive heart failure)   Syncope and collapse   Syncope-cardiogenic? history of atrial fibrillation, currently in sinus rhythm vs secondary to hypoglycemia  Troponin negative x1  CT scan is negative, although possible TIA cannot be ruled out given history of atrial fibrillation  2-D echo shows EF of 55-60% normal wall motion, grade 1 diastolic dysfunction  cardiac enzymes, continue aspirin  Not a Coumadin candidate, discontinued January 2014, do to hematuria  Continue Accu-Cheks for hypoglycemia   Bilateral pulmonary edema  X-ray shows moderate vascular congestion, doubt infection  Diuresed with IV Lasix , now switched to by mouth Lasix 40 mg Patient does have a cough that appears to be chronic  No indication for empiric antibiotics at this time   Atrial fibrillation  Continue metoprolol  Rate controlled  Currently in sinus    Hypothyroidism continue Synthroid, most recent TSH was 6.22 will repeat   Cirrhosis of the liver with ascites and anasarca  Diurese with IV Lasix and continue Aldactone  Continue lactulose, ammonia level is 61  Disposition admit for observation overnight  CODE STATUS DO NOT RESUSCITATE   Code Status: DO NOT RESUSCITATE Family Communication: family updated about patient's clinical progress Disposition Plan:  Family requesting SNF , Anticipate discharge tomorrow  Brief narrative: 80 y.o. Caucasian male with history of hypertension, hyperlipidemia, A. fib not on anticoagulation due to bleeding in 2014, history of pneumonia, hypothyroidism, GERD, constipation, depression, history of alcoholic cirrhosis, and history of left hip fracture in January of 2015 status post surgery currently at [facility] rehabilitation , [place] [facility] after an unresponsive episode described  as apnea lasting several minutes. This was associated with shortness of breath subsequently. CBG was 69 at the time of the episode.. Patient appears to be slightly confused, ammonia level was 61. Has a nonproductive cough which he claims is chronic. The patient denies any chest pain. Workup in the ER was essentially negative. No focal weakness, no strokelike symptoms . CT scan of the head was negative. No recent history of fever, diarrhea. Chest x-ray concerning for interstitial pulmonary edema versus infection. Patient was also found to have generalized anasarca and 2+ pitting edema.  Patient was recently hospitalized from 5/13-5/21, surgical site infection of recently operated incarcerated inguinal hernia repair in March 2015 treatment antibiotics. He was also treated for hepatic encephalopathy during that admission.   Consultants:  None  Procedures:  2-D echo  CT scan  Antibiotics:  None  HPI/Subjective: No complaints  Objective: Filed Vitals:   10/15/13 1700 10/15/13 2154 10/16/13 0544 10/16/13 1300  BP: 127/73 106/62 109/58 112/70  Pulse: 85 88 93 90  Temp: 98.7 F (37.1 C) 97.9 F (36.6 C) 98.6 F (37 C) 97.9 F (36.6 C)  TempSrc: Oral Oral Oral Oral  Resp: 18 20 21 20   Height: 6' (1.829 m)     Weight:   70.398 kg (155 lb 3.2 oz)   SpO2: 97% 98% 97% 97%    Intake/Output Summary (Last 24 hours) at 10/16/13 1711 Last data filed at 10/16/13 1300  Gross per 24 hour  Intake    420 ml  Output   1900 ml  Net  -1480 ml    Exam:  Cardiovascular: Regular rhythm, S1 normal and S2 normal. Exam reveals no gallop and no friction rub.  No murmur heard.  Pulmonary/Chest: Effort normal. No  respiratory distress. He has rales. He exhibits no tenderness.  Abdominal: Soft. Normal appearance and bowel sounds are normal. There is no hepatosplenomegaly. There is no tenderness. There is no rebound, no guarding, no tenderness at McBurney's point and negative Murphy's sign. No hernia.   Musculoskeletal: Normal range of motion. He exhibits edema (Bilateral pitting edema to the hips).  Neurological: He is alert and oriented to person, place, and time. He has normal strength. No cranial nerve deficit or sensory deficit. Coordination normal. GCS eye subscore is 4. GCS verbal subscore is 5. GCS motor subscore is 6.  Skin: Skin is warm, dry and intact. No rash noted. No cyanosis.  Psychiatric: He has a normal mood and affect. His speech is normal and behavior is normal. Thought content normal.    Data Reviewed: Basic Metabolic Panel:  Recent Labs Lab 10/15/13 0944 10/15/13 1616 10/16/13 0302  NA 140  --  136*  K 4.8  --  4.9  CL 106  --  102  CO2 27  --  23  GLUCOSE 74  --  93  BUN 15  --  19  CREATININE 0.91 0.96 0.97  CALCIUM 8.4  --  8.4  MG  --  2.1  --     Liver Function Tests:  Recent Labs Lab 10/15/13 0944  AST 35  ALT 14  ALKPHOS 175*  BILITOT 1.0  PROT 5.4*  ALBUMIN 1.8*   No results found for this basename: LIPASE, AMYLASE,  in the last 168 hours  Recent Labs Lab 10/15/13 1354  AMMONIA 61*    CBC:  Recent Labs Lab 10/15/13 0944 10/15/13 1707  WBC 6.6 8.8  NEUTROABS 2.9  --   HGB 10.7* 11.8*  HCT 31.6* 35.2*  MCV 90.5 91.9  PLT 138* 172    Cardiac Enzymes:  Recent Labs Lab 10/15/13 1012 10/15/13 1613 10/15/13 1656 10/16/13 0302  TROPONINI <0.30 <0.30 <0.30 <0.30   BNP (last 3 results)  Recent Labs  10/15/13 1012  PROBNP 734.1*     CBG:  Recent Labs Lab 10/15/13 1837 10/15/13 2358 10/16/13 0545 10/16/13 1123 10/16/13 1700  GLUCAP 81 100* 104* 106* 114*    Recent Results (from the past 240 hour(s))  MRSA PCR SCREENING     Status: None   Collection Time    10/15/13 11:06 PM      Result Value Ref Range Status   MRSA by PCR NEGATIVE  NEGATIVE Final   Comment:            The GeneXpert MRSA Assay (FDA     approved for NASAL specimens     only), is one component of a     comprehensive MRSA colonization      surveillance program. It is not     intended to diagnose MRSA     infection nor to guide or     monitor treatment for     MRSA infections.     Studies: Ct Head Wo Contrast  10/15/2013   CLINICAL DATA:  syncope syncope  EXAM: CT HEAD WITHOUT CONTRAST  TECHNIQUE: Contiguous axial images were obtained from the base of the skull through the vertex without intravenous contrast.  COMPARISON:  Head CT dated 01/08/2014  FINDINGS: No acute intracranial abnormality. Specifically, no hemorrhage, hydrocephalus, mass lesion, acute infarction, or significant intracranial injury. The bony calvarium appears intact. Diffuse areas of low attenuation within the subcortical, deep, and periventricular white matter regions consistent small vessel at matter ischemia. Age appropriate global  atrophy. Visualized paranasal sinuses and mastoid air cells are patent.  IMPRESSION: Chronic involutional changes.  No acute intracranial abnormality.   Electronically Signed   By: Margaree Mackintosh M.D.   On: 10/15/2013 12:09   Dg Chest Port 1 View  10/15/2013   CLINICAL DATA:  Edema, history GERD, hypertension, atrial fibrillation, alcoholic cirrhosis with esophageal varices  EXAM: PORTABLE CHEST - 1 VIEW  COMPARISON:  Portable exam 1021 hr compared to 09/03/2013  FINDINGS: Enlargement of cardiac silhouette with pulmonary vascular congestion.  Perihilar and basilar infiltrates likely pulmonary edema.  No gross pleural effusion or pneumothorax.  Bones demineralized.  Prior RIGHT shoulder replacement with LEFT glenohumeral degenerative changes.  Atherosclerotic calcification aorta.  IMPRESSION: BILATERAL pulmonary infiltrates favor pulmonary edema over infection.  Enlargement of cardiac silhouette with pulmonary vascular congestion.   Electronically Signed   By: Lavonia Dana M.D.   On: 10/15/2013 11:12    Scheduled Meds: . aspirin EC  81 mg Oral QHS  . enoxaparin (LOVENOX) injection  40 mg Subcutaneous Q24H  . finasteride  5 mg Oral  QHS  . furosemide  40 mg Oral Daily  . lactulose  30 g Oral Daily  . levothyroxine  100 mcg Oral QAC breakfast  . metoprolol tartrate  12.5 mg Oral BID  . mirtazapine  30 mg Oral QHS  . pantoprazole  40 mg Oral Daily  . sodium chloride  3 mL Intravenous Q12H  . spironolactone  50 mg Oral Daily  . vitamin B-12  1,000 mcg Oral Daily   Continuous Infusions:   Active Problems:   CHF (congestive heart failure)   Syncope and collapse    Time spent: 40 minutes   [facility] Pager [phone]. If 8PM-8AM, please contact night-coverage at [email or web address], password [facility] 10/16/2013, 5:11 PM  LOS: 1 day

## 2013-10-17 LAB — GLUCOSE, CAPILLARY
GLUCOSE-CAPILLARY: 100 mg/dL — AB (ref 70–99)
Glucose-Capillary: 105 mg/dL — ABNORMAL HIGH (ref 70–99)
Glucose-Capillary: 91 mg/dL (ref 70–99)

## 2013-10-17 MED ORDER — GUAIFENESIN-DM 100-10 MG/5ML PO SYRP: 5.0000 mL | ORAL_SOLUTION | ORAL | Status: AC | PRN

## 2013-10-17 MED ORDER — FUROSEMIDE 40 MG PO TABS: 40.0000 mg | ORAL_TABLET | Freq: Every day | ORAL | Status: AC

## 2013-10-17 MED ORDER — LEVALBUTEROL HCL 0.63 MG/3ML IN NEBU: 0.6300 mg | INHALATION_SOLUTION | Freq: Four times a day (QID) | RESPIRATORY_TRACT | Status: AC | PRN

## 2013-10-17 NOTE — Plan of Care (Signed)
Problem: Phase I Progression Outcomes Goal: EF % per last Echo/documented,Core Reminder form on chart Outcome: Completed/Met Date Met:  10/17/13 EF 55-60% per ECHO performed on 10/16/13.

## 2013-10-17 NOTE — Progress Notes (Signed)
Called report to Raymond Larsen at [facility]. Awaiting time for ambulance arrival.

## 2013-10-17 NOTE — Progress Notes (Signed)
Pt alert and responsive. Some confusion noted. Foley was discontinued. Pt had large BM. Denies pain and discomfort. Pt worked with pt today.

## 2013-10-17 NOTE — Progress Notes (Signed)
Patient is a resident of [place]; he has a past history of placement at [facility]. Family agrees to SNF placement again and requests SNF bed search which has been initiated. MD- please let me know when patient will be medically stable for d/c; will have SNF bed available. Lorie Phenix. [place], [place]

## 2013-10-17 NOTE — Evaluation (Signed)
**Note De-Identified Rahima Fleishman Obfuscation** Physical Therapy Evaluation Patient Details Name: Raymond Roots Sr. MRN: [id number] DOB: 25-Jan-1923 Today's Date: 10/17/2013   History of Present Illness  Pt is an 79 y/o male currently living in an independent living facility. He presents to the [facility] after an unresponsive episode described as apnea lasting several minutes.  Clinical Impression  Pt admitted with the above. Pt currently with functional limitations due to the deficits listed below (see PT Problem List). At the time of PT eval, pt required mod-max assist for mobility and would recommend +2 assist for any ambulation. Pt will benefit from skilled PT to increase their independence and safety with mobility to allow discharge to the venue listed below.    Follow Up Recommendations SNF;Supervision/Assistance - 24 hour    Equipment Recommendations  Rolling walker with 5" wheels;3in1 (PT)    Recommendations for Other Services       Precautions / Restrictions Precautions Precautions: Fall Restrictions Weight Bearing Restrictions: No      Mobility  Bed Mobility Overal bed mobility: Needs Assistance Bed Mobility: Rolling;Sidelying to Sit Rolling: Max assist Sidelying to sit: Mod assist       General bed mobility comments: Bed pad used to assist pt during rolling into sidelying. Pt able to initiate trunk elevation into sitting position with assist from therapist.   Transfers Overall transfer level: Needs assistance Equipment used: Rolling walker (2 wheeled) Transfers: Sit to/from [employer] Sit to Stand: Max assist Stand pivot transfers: Mod assist       General transfer comment: VC's for hand placement on seated surface for safety. Max assist to power-up to full standing position, with mod assist to pivot around to the recliner. Pt sat prematurely before was squared with the chair, and required increased time to straighten out.   Ambulation/Gait                Stairs             Wheelchair Mobility    Modified Rankin (Stroke Patients Only)       Balance Overall balance assessment: Needs assistance Sitting-balance support: Feet supported;Bilateral upper extremity supported Sitting balance-Leahy Scale: Fair     Standing balance support: Bilateral upper extremity supported Standing balance-Leahy Scale: Poor                               Pertinent Vitals/Pain Vitals stable throughout session.    Home Living Family/patient expects to be discharged to:: Skilled nursing facility                      Prior Function Level of Independence: Independent         Comments: From independent living. Pt reports he has meals prepared for him but he was able to perform ADL's and ambulation without an AD. Pt poor historian and disoriented during PT eval     Hand Dominance   Dominant Hand: Right    Extremity/Trunk Assessment   Upper Extremity Assessment: Generalized weakness           Lower Extremity Assessment: Generalized weakness      Cervical / Trunk Assessment: Kyphotic  Communication   Communication: HOH  Cognition Arousal/Alertness: Awake/alert Behavior During Therapy: WFL for tasks assessed/performed Overall Cognitive Status: No family/caregiver present to determine baseline cognitive functioning       Memory: Decreased short-term memory **Note De-Identified  Obfuscation** General Comments General comments (skin integrity, edema, etc.): Pt incontinent of bowel during transfers.     Exercises        Assessment/Plan    PT Assessment Patient needs continued PT services  PT Diagnosis Difficulty walking;Generalized weakness   PT Problem List Decreased strength;Decreased range of motion;Decreased activity tolerance;Decreased balance;Decreased mobility;Decreased knowledge of use of DME;Decreased safety awareness;Decreased knowledge of precautions  PT Treatment Interventions DME instruction;Gait training;Stair training;Functional  mobility training;Therapeutic activities;Therapeutic exercise;Neuromuscular re-education;Patient/family education   PT Goals (Current goals can be found in the Care Plan section) Acute Rehab PT Goals Patient Stated Goal: None stated PT Goal Formulation: With patient Time For Goal Achievement: 10/31/13 Potential to Achieve Goals: Fair    Frequency Min 2X/week   Barriers to discharge        Co-evaluation               End of Session Equipment Utilized During Treatment: Gait belt Activity Tolerance: Patient limited by fatigue Patient left: in chair;with chair alarm set;with call bell/phone within reach Nurse Communication: Mobility status         Time: 3976-7341 PT Time Calculation (min): 33 min   Charges:   PT Evaluation $Initial PT Evaluation Tier I: 1 Procedure PT Treatments $Therapeutic Activity: 23-37 mins   PT G CodesJolyn Lent 10/17/2013, 11:04 AM  Jolyn Lent, PT, DPT Acute Rehabilitation Services Pager: [phone]

## 2013-10-17 NOTE — Discharge Summary (Addendum)
Physician Discharge Summary  Raymond Morrow Sr. MRN: [id number] DOB/AGE: 12-08-1922 80 y.o.  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     finasteride 5 MG tablet  Commonly known as:  PROSCAR  Take 5 mg by mouth at bedtime.     furosemide 40 MG tablet  Commonly known as:  LASIX  Take 1 tablet (40 mg total) by mouth daily.     guaiFENesin-dextromethorphan 100-10 MG/5ML syrup  Commonly known as:  ROBITUSSIN DM  Take 5 mLs by mouth every 4 (four) hours as needed for cough.     lactulose 10 GM/15ML solution  Commonly known as:  CHRONULAC  Take 30 g by mouth daily.     levalbuterol 0.63 MG/3ML nebulizer solution  Commonly known as:  XOPENEX  Take 3 mLs (0.63 mg total) by nebulization every 6 (six) hours as needed for wheezing or shortness of breath.     levothyroxine 100 MCG tablet  Commonly known as:  SYNTHROID, LEVOTHROID  Take 100 mcg by mouth daily before breakfast.     metoprolol tartrate 25 MG tablet  Commonly known as:  LOPRESSOR  Take 0.5 tablets (12.5 mg total) by mouth 2 (two) times daily.     mirtazapine 30 MG tablet  Commonly known as:  REMERON  Take 30 mg by mouth at bedtime.     nitroGLYCERIN 0.4 MG SL tablet  Commonly known as:  NITROSTAT  Place 0.4 mg under the tongue every 5 (five) minutes as needed for chest pain. Give 1 tablet every 5 minutes for chest pain x 3 doses.  If unrelieved after 3 doses, call MD     pantoprazole 40 MG tablet  Commonly known as:  PROTONIX   Take 40 mg by mouth daily.     spironolactone 25 MG tablet  Commonly known as:  ALDACTONE  Take 50 mg by mouth daily.     vitamin B-12 1000 MCG tablet  Commonly known as:  CYANOCOBALAMIN  Take 1,000 mcg by mouth daily.        Discharge Condition: Stable Disposition: 06-Home-Health Care Svc   Consults: None  Significant Diagnostic Studies: Ct Head Wo Contrast  10/15/2013   CLINICAL DATA:  syncope syncope  EXAM: CT HEAD WITHOUT CONTRAST  TECHNIQUE: Contiguous axial images were obtained from the base of the skull through the vertex without intravenous contrast.  COMPARISON:  Head CT dated 01/08/2014  FINDINGS: No acute intracranial abnormality. Specifically, no hemorrhage, hydrocephalus, mass lesion, acute infarction, or significant intracranial injury. The bony calvarium appears intact. Diffuse areas of low attenuation within the subcortical, deep, and periventricular white matter regions consistent small vessel at matter ischemia. Age appropriate global atrophy. Visualized paranasal sinuses and mastoid air cells are patent.  IMPRESSION: Chronic involutional changes.  No acute intracranial abnormality.   Electronically Signed   By: Margaree Mackintosh M.D.   On: 10/15/2013 12:09   Dg Chest Port 1 View  10/15/2013   CLINICAL DATA:  Edema,  history GERD, hypertension, atrial fibrillation, alcoholic cirrhosis with esophageal varices  EXAM: PORTABLE CHEST - 1 VIEW  COMPARISON:  Portable exam 1021 hr compared to 09/03/2013  FINDINGS: Enlargement of cardiac silhouette with pulmonary vascular congestion.  Perihilar and basilar infiltrates likely pulmonary edema.  No gross pleural effusion or pneumothorax.  Bones demineralized.  Prior RIGHT shoulder replacement with LEFT glenohumeral degenerative changes.  Atherosclerotic calcification aorta.  IMPRESSION: BILATERAL pulmonary infiltrates favor pulmonary edema over infection.  Enlargement of cardiac silhouette with pulmonary vascular congestion.    Electronically Signed   By: Lavonia Dana M.D.   On: 10/15/2013 11:12       Microbiology: Recent Results (from the past 240 hour(s))  MRSA PCR SCREENING     Status: None   Collection Time    10/15/13 11:06 PM      Result Value Ref Range Status   MRSA by PCR NEGATIVE  NEGATIVE Final   Comment:            The GeneXpert MRSA Assay (FDA     approved for NASAL specimens     only), is one component of a     comprehensive MRSA colonization     surveillance program. It is not     intended to diagnose MRSA     infection nor to guide or     monitor treatment for     MRSA infections.     Labs: Results for orders placed during the hospital encounter of 10/15/13 (from the past 48 hour(s))  CBC WITH DIFFERENTIAL     Status: Abnormal   Collection Time    10/15/13  9:44 AM      Result Value Ref Range   WBC 6.6  4.0 - 10.5 K/uL   RBC 3.49 (*) 4.22 - 5.81 MIL/uL   Hemoglobin 10.7 (*) 13.0 - 17.0 g/dL   HCT 31.6 (*) 39.0 - 52.0 %   MCV 90.5  78.0 - 100.0 fL   MCH 30.7  26.0 - 34.0 pg   MCHC 33.9  30.0 - 36.0 g/dL   RDW 14.8  11.5 - 15.5 %   Platelets 138 (*) 150 - 400 K/uL   Neutrophils Relative % 42 (*) 43 - 77 %   Neutro Abs 2.9  1.7 - 7.7 K/uL   Lymphocytes Relative 35  12 - 46 %   Lymphs Abs 2.3  0.7 - 4.0 K/uL   Monocytes Relative 11  3 - 12 %   Monocytes Absolute 0.7  0.1 - 1.0 K/uL   Eosinophils Relative 11 (*) 0 - 5 %   Eosinophils Absolute 0.7  0.0 - 0.7 K/uL   Basophils Relative 1  0 - 1 %   Basophils Absolute 0.1  0.0 - 0.1 K/uL  COMPREHENSIVE METABOLIC PANEL     Status: Abnormal   Collection Time    10/15/13  9:44 AM      Result Value Ref Range   Sodium 140  137 - 147 mEq/L   Potassium 4.8  3.7 - 5.3 mEq/L   Chloride 106  96 - 112 mEq/L   CO2 27  19 - 32 mEq/L   Glucose, Bld 74  70 - 99 mg/dL   BUN 15  6 - 23 mg/dL   Creatinine, Ser 0.91  0.50 - 1.35 mg/dL   Calcium 8.4  8.4 - 10.5 mg/dL   Total Protein 5.4 (*) 6.0 - 8.3 g/dL   Albumin 1.8 (*) 3.5 - 5.2 g/dL    AST  35  0 - 37 U/L   ALT 14  0 - 53 U/L   Alkaline Phosphatase 175 (*) 39 - 117 U/L   Total Bilirubin 1.0  0.3 - 1.2 mg/dL   GFR calc non Af Amer 73 (*) >90 mL/min   GFR calc Af Amer 85 (*) >90 mL/min   Comment: (NOTE)     The eGFR has been calculated using the CKD EPI equation.     This calculation has not been validated in all clinical situations.     eGFR's persistently <90 mL/min signify possible Chronic Kidney     Disease.  CBG MONITORING, ED     Status: Abnormal   Collection Time    10/15/13 10:03 AM      Result Value Ref Range   Glucose-Capillary 69 (*) 70 - 99 mg/dL   Comment 1 Documented in Chart     Comment 2 Notify RN    TROPONIN I     Status: None   Collection Time    10/15/13 10:12 AM      Result Value Ref Range   Troponin I <0.30  <0.30 ng/mL   Comment:            Due to the release kinetics of cTnI,     a negative result within the first hours     of the onset of symptoms does not rule out     myocardial infarction with certainty.     If myocardial infarction is still suspected,     repeat the test at appropriate intervals.  PRO B NATRIURETIC PEPTIDE     Status: Abnormal   Collection Time    10/15/13 10:12 AM      Result Value Ref Range   Pro B Natriuretic peptide (BNP) 734.1 (*) 0 - 450 pg/mL  POC OCCULT BLOOD, ED     Status: Abnormal   Collection Time    10/15/13 10:18 AM      Result Value Ref Range   Fecal Occult Bld POSITIVE (*) NEGATIVE  URINALYSIS, ROUTINE W REFLEX MICROSCOPIC     Status: None   Collection Time    10/15/13 10:20 AM      Result Value Ref Range   Color, Urine YELLOW  YELLOW   APPearance CLEAR  CLEAR   Specific Gravity, Urine 1.014  1.005 - 1.030   pH 8.0  5.0 - 8.0   Glucose, UA NEGATIVE  NEGATIVE mg/dL   Hgb urine dipstick NEGATIVE  NEGATIVE   Bilirubin Urine NEGATIVE  NEGATIVE   Ketones, ur NEGATIVE  NEGATIVE mg/dL   Protein, ur NEGATIVE  NEGATIVE mg/dL   Urobilinogen, UA 1.0  0.0 - 1.0 mg/dL   Nitrite NEGATIVE  NEGATIVE    Leukocytes, UA NEGATIVE  NEGATIVE   Comment: MICROSCOPIC NOT DONE ON URINES WITH NEGATIVE PROTEIN, BLOOD, LEUKOCYTES, NITRITE, OR GLUCOSE <1000 mg/dL.  I-STAT CG4 LACTIC ACID, ED     Status: None   Collection Time    10/15/13 10:23 AM      Result Value Ref Range   Lactic Acid, Venous 1.21  0.5 - 2.2 mmol/L  CBG MONITORING, ED     Status: Abnormal   Collection Time    10/15/13 12:57 PM      Result Value Ref Range   Glucose-Capillary 69 (*) 70 - 99 mg/dL   Comment 1 Documented in Chart     Comment 2 Notify RN    AMMONIA     Status: Abnormal  Collection Time    10/15/13  1:54 PM      Result Value Ref Range   Ammonia 61 (*) 11 - 60 umol/L  TROPONIN I     Status: None   Collection Time    10/15/13  4:13 PM      Result Value Ref Range   Troponin I <0.30  <0.30 ng/mL   Comment:            Due to the release kinetics of cTnI,     a negative result within the first hours     of the onset of symptoms does not rule out     myocardial infarction with certainty.     If myocardial infarction is still suspected,     repeat the test at appropriate intervals.  CREATININE, SERUM     Status: Abnormal   Collection Time    10/15/13  4:16 PM      Result Value Ref Range   Creatinine, Ser 0.96  0.50 - 1.35 mg/dL   GFR calc non Af Amer 72 (*) >90 mL/min   GFR calc Af Amer 83 (*) >90 mL/min   Comment: (NOTE)     The eGFR has been calculated using the CKD EPI equation.     This calculation has not been validated in all clinical situations.     eGFR's persistently <90 mL/min signify possible Chronic Kidney     Disease.  MAGNESIUM     Status: None   Collection Time    10/15/13  4:16 PM      Result Value Ref Range   Magnesium 2.1  1.5 - 2.5 mg/dL  TROPONIN I     Status: None   Collection Time    10/15/13  4:56 PM      Result Value Ref Range   Troponin I <0.30  <0.30 ng/mL   Comment:            Due to the release kinetics of cTnI,     a negative result within the first hours     of the  onset of symptoms does not rule out     myocardial infarction with certainty.     If myocardial infarction is still suspected,     repeat the test at appropriate intervals.  TSH     Status: None   Collection Time    10/15/13  5:07 PM      Result Value Ref Range   TSH 1.370  0.350 - 4.500 uIU/mL  CBC     Status: Abnormal   Collection Time    10/15/13  5:07 PM      Result Value Ref Range   WBC 8.8  4.0 - 10.5 K/uL   RBC 3.83 (*) 4.22 - 5.81 MIL/uL   Hemoglobin 11.8 (*) 13.0 - 17.0 g/dL   HCT 35.2 (*) 39.0 - 52.0 %   MCV 91.9  78.0 - 100.0 fL   MCH 30.8  26.0 - 34.0 pg   MCHC 33.5  30.0 - 36.0 g/dL   RDW 14.9  11.5 - 15.5 %   Platelets 172  150 - 400 K/uL  GLUCOSE, CAPILLARY     Status: None   Collection Time    10/15/13  6:37 PM      Result Value Ref Range   Glucose-Capillary 81  70 - 99 mg/dL   Comment 1 Documented in Chart     Comment 2 Notify RN    MRSA PCR SCREENING  Status: None   Collection Time    10/15/13 11:06 PM      Result Value Ref Range   MRSA by PCR NEGATIVE  NEGATIVE   Comment:            The GeneXpert MRSA Assay (FDA     approved for NASAL specimens     only), is one component of a     comprehensive MRSA colonization     surveillance program. It is not     intended to diagnose MRSA     infection nor to guide or     monitor treatment for     MRSA infections.  GLUCOSE, CAPILLARY     Status: Abnormal   Collection Time    10/15/13 11:58 PM      Result Value Ref Range   Glucose-Capillary 100 (*) 70 - 99 mg/dL   Comment 1 Notify RN    TROPONIN I     Status: None   Collection Time    10/16/13  3:02 AM      Result Value Ref Range   Troponin I <0.30  <0.30 ng/mL   Comment:            Due to the release kinetics of cTnI,     a negative result within the first hours     of the onset of symptoms does not rule out     myocardial infarction with certainty.     If myocardial infarction is still suspected,     repeat the test at appropriate intervals.   BASIC METABOLIC PANEL     Status: Abnormal   Collection Time    10/16/13  3:02 AM      Result Value Ref Range   Sodium 136 (*) 137 - 147 mEq/L   Potassium 4.9  3.7 - 5.3 mEq/L   Chloride 102  96 - 112 mEq/L   CO2 23  19 - 32 mEq/L   Glucose, Bld 93  70 - 99 mg/dL   BUN 19  6 - 23 mg/dL   Creatinine, Ser 0.97  0.50 - 1.35 mg/dL   Calcium 8.4  8.4 - 10.5 mg/dL   GFR calc non Af Amer 72 (*) >90 mL/min   GFR calc Af Amer 83 (*) >90 mL/min   Comment: (NOTE)     The eGFR has been calculated using the CKD EPI equation.     This calculation has not been validated in all clinical situations.     eGFR's persistently <90 mL/min signify possible Chronic Kidney     Disease.  GLUCOSE, CAPILLARY     Status: Abnormal   Collection Time    10/16/13  5:45 AM      Result Value Ref Range   Glucose-Capillary 104 (*) 70 - 99 mg/dL  GLUCOSE, CAPILLARY     Status: Abnormal   Collection Time    10/16/13 11:23 AM      Result Value Ref Range   Glucose-Capillary 106 (*) 70 - 99 mg/dL  GLUCOSE, CAPILLARY     Status: Abnormal   Collection Time    10/16/13  5:00 PM      Result Value Ref Range   Glucose-Capillary 114 (*) 70 - 99 mg/dL   Comment 1 Notify RN    BASIC METABOLIC PANEL     Status: Abnormal   Collection Time    10/16/13  6:25 PM      Result Value Ref Range   Sodium 137  137 -  147 mEq/L   Potassium 4.6  3.7 - 5.3 mEq/L   Chloride 102  96 - 112 mEq/L   CO2 26  19 - 32 mEq/L   Glucose, Bld 125 (*) 70 - 99 mg/dL   BUN 21  6 - 23 mg/dL   Creatinine, Ser 1.12  0.50 - 1.35 mg/dL   Calcium 8.0 (*) 8.4 - 10.5 mg/dL   GFR calc non Af Amer 57 (*) >90 mL/min   GFR calc Af Amer 66 (*) >90 mL/min   Comment: (NOTE)     The eGFR has been calculated using the CKD EPI equation.     This calculation has not been validated in all clinical situations.     eGFR's persistently <90 mL/min signify possible Chronic Kidney     Disease.  GLUCOSE, CAPILLARY     Status: Abnormal   Collection Time    10/17/13   1:25 AM      Result Value Ref Range   Glucose-Capillary 100 (*) 70 - 99 mg/dL  GLUCOSE, CAPILLARY     Status: Abnormal   Collection Time    10/17/13  1:29 AM      Result Value Ref Range   Glucose-Capillary 105 (*) 70 - 99 mg/dL  GLUCOSE, CAPILLARY     Status: None   Collection Time    10/17/13  5:57 AM      Result Value Ref Range   Glucose-Capillary 91  70 - 99 mg/dL     HPI : 80 y.o. Caucasian male with history of hypertension, hyperlipidemia, A. fib not on anticoagulation due to bleeding in 2014, history of pneumonia, hypothyroidism, GERD, constipation, depression, history of alcoholic cirrhosis, and history of left hip fracture in January of 2015 status post surgery currently at [facility] rehabilitation , [place] [facility] after an unresponsive episode described as apnea lasting several minutes. This was associated with shortness of breath subsequently. CBG was 69 at the time of the episode.. Patient appears to be slightly confused, ammonia level was 61. Has a nonproductive cough which he claims is chronic. The patient denies any chest pain. Workup in the ER was essentially negative. No focal weakness, no strokelike symptoms . CT scan of the head was negative. No recent history of fever, diarrhea. Chest x-ray concerning for interstitial pulmonary edema versus infection. Patient was also found to have generalized anasarca and 2+ pitting edema.  Patient was recently hospitalized from 5/13-5/21, surgical site infection of recently operated incarcerated inguinal hernia repair in March 2015 treatment antibiotics. He was also treated for hepatic encephalopathy during that admission.  HOSPITAL COURSE:   Syncope-cardiogenic? history of atrial fibrillation, patient remained in sinus rhythm throughout his hospitalization , he was found to be  mildly hypoglycemic at 69 upon admission and repeat CBGs remained stable Cardiac enzymes remained negative Head CT is  negative,  TIA  remains a possibility however the patient did not have any   focal neurologic deficits 2-D echo shows EF of 55-60% normal wall motion, grade 1 diastolic dysfunction  cardiac enzymes, continue aspirin  Not a Coumadin candidate, discontinued January 2014, do to hematuria     Bilateral pulmonary edema  X-ray showed  moderate vascular congestion, there was a  low suspicion for underlying infection he was Diuresed with IV Lasix , now switched to by mouth Lasix 40 mg  Patient does have a cough that appears to be chronic  No indication for empiric antibiotics at this time  He was started on Robitussin  and Xopenex as needed for supportive therapy   Atrial fibrillation   he remained on metoprolol  Rate controlled   remained in sinus rhythm   Hypothyroidism continue Synthroid, most recent TSH was  1.37   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 rate 18, height 6' (1.829 m), weight 70.398 kg (155 lb 3.2 oz), SpO2 98.00%.  Cardiovascular: Regular rhythm, S1 normal and S2 normal. Exam reveals no gallop and no friction rub.  No murmur heard.  Pulmonary/Chest: Effort normal. No respiratory distress. He has rales. He exhibits no tenderness.  Abdominal: Soft. Normal appearance and bowel sounds are normal. There is no hepatosplenomegaly. There is no tenderness. There is no rebound, no guarding, no tenderness at McBurney's point and negative Murphy's sign. No hernia.  Musculoskeletal: Normal range of motion. He exhibits edema (Bilateral pitting edema to the hips).  Neurological: He is  alert and oriented to self. He has normal strength. No cranial nerve deficit or sensory deficit. Coordination normal. GCS eye subscore is 4. GCS verbal subscore is 5. GCS motor subscore is 6.  Skin: Skin is warm, dry and intact. No rash noted. No cyanosis.  Psychiatric: He has a normal mood and affect. His speech is normal and behavior is normal. Thought content normal.        Discharge Instructions   Call MD for:  difficulty breathing, headache or visual disturbances    Complete by:  As directed      Call MD for:  persistant nausea and vomiting    Complete by:  As directed      Call MD for:  redness, tenderness, or signs of infection (pain, swelling, redness, odor or green/yellow discharge around incision site)    Complete by:  As directed      Call MD for:  severe uncontrolled pain    Complete by:  As directed      Call MD for:  temperature >100.4    Complete by:  As directed      Diet - low sodium heart healthy    Complete by:  As directed      Increase activity slowly    Complete by:  As directed            Follow-up Information   Follow up with MCGOWEN,PHILIP H, MD In 1 week.   Specialty:  Family Medicine   Contact information:   [id number] [place] [address] [phone]       Signed: Reyne Dumas 10/17/2013, 7:51 AM

## 2013-10-17 NOTE — Progress Notes (Addendum)
Speech Language Pathology Treatment: Dysphagia  Patient Details Name: Raymond Mcglinchey Sr. MRN: [id number] DOB: 1922/07/22 Today's Date: 10/17/2013 Time: 1550-1610 SLP Time Calculation (min): 20 min  Assessment / Plan / Recommendation Clinical Impression  Pt demonstrated immediate cough x2 following cup sips of thin liquid. Cued pt to take smaller sips, after which he did and did not cough. Pt needs verbal/ tactile cues to ensure small cup sips of thin liquid are taken. No s/s of aspiration observed with puree and dysphagia 2 consistencies. Given this pt is at moderate risk of aspiration without full supervision to cue for small sips of thin liquid at a time. Recommending that full supervision during meals continues at next level of care. Continue reflux and aspiration precautions- cueing for small bites/ sips, sit upright at least 30 minutes after meal, occasional cue for throat clear- continuing with dysphagia 2 diet and thin liquids. ST will continue to follow for duration of stay in hospital.     HPI HPI: 80 y.o. Caucasian male with history of hypertension, hyperlipidemia, A. fib not on anticoagulation due to bleeding in 2014, history of pneumonia, hypothyroidism, GERD, constipation, depression, history of alcoholic cirrhosis, and history of left hip fracture in January of 2015 status post surgery currently at [facility] rehabilitation  , [place] [facility] after an unresponsive episode described as apnea lasting several minutes. This was associated with shortness of breath subsequently. CBG was 69 at the time of the episode.. Patient appears to be slightly confused, ammonia level was 61. Has a nonproductive cough which he claims is chronic. The patient denies any chest pain. Workup in the ER was essentially negative. No focal weakness, no strokelike symptoms . CT scan of the head was negative. No recent history of fever, diarrhea. Chest x-ray concerning for interstitial pulmonary edema  versus infection. Patient was also found to have generalized anasarca and 2+ pitting edema.  H/O chronic dysphagia and recurrent pneumonia.   Pertinent Vitals n/a  SLP Plan  Continue with current plan of care    Recommendations Diet recommendations: Dysphagia 2 (fine chop);Thin liquid Liquids provided via: Cup Medication Administration: Whole meds with puree Supervision: Patient able to self feed;Staff to assist with self feeding;Full supervision/cueing for compensatory strategies Compensations: Slow rate;Small sips/bites;Follow solids with liquid;Clear throat intermittently Postural Changes and/or Swallow Maneuvers: Seated upright 90 degrees;Upright 30-60 min after meal              Oral Care Recommendations: Oral care BID Follow up Recommendations: Skilled Nursing facility Plan: Continue with current plan of care    GO     Kern Reap, MA, CCC-SLP 10/17/2013, 4:13 PM

## 2013-10-20 ENCOUNTER — Ambulatory Visit: Payer: Medicare HMO | Admitting: Family Medicine

## 2013-11-03 ENCOUNTER — Emergency Department (HOSPITAL_COMMUNITY): Payer: Medicare HMO

## 2013-11-03 ENCOUNTER — Inpatient Hospital Stay (HOSPITAL_COMMUNITY)
Admission: EM | Admit: 2013-11-03 | Discharge: 2013-11-07 | DRG: 963 | Disposition: A | Discharge status: Skilled Nursing Facility | Payer: Medicare HMO | Attending: Internal Medicine | Admitting: Internal Medicine

## 2013-11-03 ENCOUNTER — Encounter (HOSPITAL_COMMUNITY): Payer: Self-pay | Admitting: Emergency Medicine

## 2013-11-03 DIAGNOSIS — M1712 Unilateral primary osteoarthritis, left knee: Secondary | ICD-10-CM

## 2013-11-03 DIAGNOSIS — E039 Hypothyroidism, unspecified: Secondary | ICD-10-CM | POA: Diagnosis present

## 2013-11-03 DIAGNOSIS — S72109A Unspecified trochanteric fracture of unspecified femur, initial encounter for closed fracture: Secondary | ICD-10-CM | POA: Diagnosis not present

## 2013-11-03 DIAGNOSIS — K7031 Alcoholic cirrhosis of liver with ascites: Secondary | ICD-10-CM

## 2013-11-03 DIAGNOSIS — F411 Generalized anxiety disorder: Secondary | ICD-10-CM | POA: Diagnosis present

## 2013-11-03 DIAGNOSIS — F0390 Unspecified dementia without behavioral disturbance: Secondary | ICD-10-CM | POA: Diagnosis present

## 2013-11-03 DIAGNOSIS — S72001A Fracture of unspecified part of neck of right femur, initial encounter for closed fracture: Secondary | ICD-10-CM

## 2013-11-03 DIAGNOSIS — Z96619 Presence of unspecified artificial shoulder joint: Secondary | ICD-10-CM

## 2013-11-03 DIAGNOSIS — IMO0002 Reserved for concepts with insufficient information to code with codable children: Secondary | ICD-10-CM

## 2013-11-03 DIAGNOSIS — M7918 Myalgia, other site: Secondary | ICD-10-CM

## 2013-11-03 DIAGNOSIS — G9340 Encephalopathy, unspecified: Secondary | ICD-10-CM | POA: Diagnosis present

## 2013-11-03 DIAGNOSIS — Z96659 Presence of unspecified artificial knee joint: Secondary | ICD-10-CM

## 2013-11-03 DIAGNOSIS — N138 Other obstructive and reflux uropathy: Secondary | ICD-10-CM | POA: Diagnosis present

## 2013-11-03 DIAGNOSIS — M25559 Pain in unspecified hip: Secondary | ICD-10-CM | POA: Diagnosis not present

## 2013-11-03 DIAGNOSIS — Z8546 Personal history of malignant neoplasm of prostate: Secondary | ICD-10-CM

## 2013-11-03 DIAGNOSIS — K219 Gastro-esophageal reflux disease without esophagitis: Secondary | ICD-10-CM | POA: Diagnosis present

## 2013-11-03 DIAGNOSIS — S329XXA Fracture of unspecified parts of lumbosacral spine and pelvis, initial encounter for closed fracture: Secondary | ICD-10-CM

## 2013-11-03 DIAGNOSIS — S3210XA Unspecified fracture of sacrum, initial encounter for closed fracture: Secondary | ICD-10-CM | POA: Diagnosis present

## 2013-11-03 DIAGNOSIS — Z7982 Long term (current) use of aspirin: Secondary | ICD-10-CM

## 2013-11-03 DIAGNOSIS — Z87891 Personal history of nicotine dependence: Secondary | ICD-10-CM

## 2013-11-03 DIAGNOSIS — E785 Hyperlipidemia, unspecified: Secondary | ICD-10-CM | POA: Diagnosis present

## 2013-11-03 DIAGNOSIS — S32509A Unspecified fracture of unspecified pubis, initial encounter for closed fracture: Secondary | ICD-10-CM | POA: Diagnosis present

## 2013-11-03 DIAGNOSIS — N390 Urinary tract infection, site not specified: Secondary | ICD-10-CM | POA: Diagnosis present

## 2013-11-03 DIAGNOSIS — N401 Enlarged prostate with lower urinary tract symptoms: Secondary | ICD-10-CM | POA: Diagnosis present

## 2013-11-03 DIAGNOSIS — K7682 Hepatic encephalopathy: Secondary | ICD-10-CM

## 2013-11-03 DIAGNOSIS — M19019 Primary osteoarthritis, unspecified shoulder: Secondary | ICD-10-CM

## 2013-11-03 DIAGNOSIS — F3289 Other specified depressive episodes: Secondary | ICD-10-CM | POA: Diagnosis present

## 2013-11-03 DIAGNOSIS — I4891 Unspecified atrial fibrillation: Secondary | ICD-10-CM | POA: Diagnosis present

## 2013-11-03 DIAGNOSIS — G8929 Other chronic pain: Secondary | ICD-10-CM

## 2013-11-03 DIAGNOSIS — I10 Essential (primary) hypertension: Secondary | ICD-10-CM | POA: Diagnosis present

## 2013-11-03 DIAGNOSIS — D649 Anemia, unspecified: Secondary | ICD-10-CM | POA: Diagnosis present

## 2013-11-03 DIAGNOSIS — I509 Heart failure, unspecified: Secondary | ICD-10-CM

## 2013-11-03 DIAGNOSIS — D638 Anemia in other chronic diseases classified elsewhere: Secondary | ICD-10-CM | POA: Diagnosis present

## 2013-11-03 DIAGNOSIS — I5032 Chronic diastolic (congestive) heart failure: Secondary | ICD-10-CM | POA: Diagnosis present

## 2013-11-03 DIAGNOSIS — E038 Other specified hypothyroidism: Secondary | ICD-10-CM

## 2013-11-03 DIAGNOSIS — S322XXA Fracture of coccyx, initial encounter for closed fracture: Secondary | ICD-10-CM

## 2013-11-03 DIAGNOSIS — R55 Syncope and collapse: Secondary | ICD-10-CM

## 2013-11-03 DIAGNOSIS — R64 Cachexia: Secondary | ICD-10-CM | POA: Diagnosis present

## 2013-11-03 DIAGNOSIS — W19XXXA Unspecified fall, initial encounter: Secondary | ICD-10-CM

## 2013-11-03 DIAGNOSIS — N400 Enlarged prostate without lower urinary tract symptoms: Secondary | ICD-10-CM

## 2013-11-03 DIAGNOSIS — K7290 Hepatic failure, unspecified without coma: Secondary | ICD-10-CM

## 2013-11-03 DIAGNOSIS — I480 Paroxysmal atrial fibrillation: Secondary | ICD-10-CM

## 2013-11-03 DIAGNOSIS — K7030 Alcoholic cirrhosis of liver without ascites: Secondary | ICD-10-CM | POA: Diagnosis present

## 2013-11-03 DIAGNOSIS — W010XXA Fall on same level from slipping, tripping and stumbling without subsequent striking against object, initial encounter: Secondary | ICD-10-CM | POA: Diagnosis present

## 2013-11-03 DIAGNOSIS — F329 Major depressive disorder, single episode, unspecified: Secondary | ICD-10-CM | POA: Diagnosis present

## 2013-11-03 DIAGNOSIS — N139 Obstructive and reflux uropathy, unspecified: Secondary | ICD-10-CM | POA: Diagnosis present

## 2013-11-03 DIAGNOSIS — Z66 Do not resuscitate: Secondary | ICD-10-CM | POA: Diagnosis present

## 2013-11-03 DIAGNOSIS — R609 Edema, unspecified: Secondary | ICD-10-CM

## 2013-11-03 DIAGNOSIS — E43 Unspecified severe protein-calorie malnutrition: Secondary | ICD-10-CM

## 2013-11-03 DIAGNOSIS — H9190 Unspecified hearing loss, unspecified ear: Secondary | ICD-10-CM | POA: Diagnosis present

## 2013-11-03 LAB — BASIC METABOLIC PANEL
ANION GAP: 8 (ref 5–15)
BUN: 25 mg/dL — AB (ref 6–23)
CO2: 26 mEq/L (ref 19–32)
Calcium: 7.8 mg/dL — ABNORMAL LOW (ref 8.4–10.5)
Chloride: 105 mEq/L (ref 96–112)
Creatinine, Ser: 1.12 mg/dL (ref 0.50–1.35)
GFR calc non Af Amer: 57 mL/min — ABNORMAL LOW (ref >90)
GFR, EST AFRICAN AMERICAN: 66 mL/min — AB (ref >90)
Glucose, Bld: 104 mg/dL — ABNORMAL HIGH (ref 70–99)
POTASSIUM: 4.1 meq/L (ref 3.7–5.3)
Sodium: 139 mEq/L (ref 137–147)

## 2013-11-03 LAB — CBC
HCT: 25.7 % — ABNORMAL LOW (ref 39.0–52.0)
Hemoglobin: 8.7 g/dL — ABNORMAL LOW (ref 13.0–17.0)
MCH: 31.3 pg (ref 26.0–34.0)
MCHC: 33.9 g/dL (ref 30.0–36.0)
MCV: 92.4 fL (ref 78.0–100.0)
PLATELETS: 155 10*3/uL (ref 150–400)
RBC: 2.78 MIL/uL — ABNORMAL LOW (ref 4.22–5.81)
RDW: 15.1 % (ref 11.5–15.5)
WBC: 8.7 10*3/uL (ref 4.0–10.5)

## 2013-11-03 LAB — URINALYSIS, ROUTINE W REFLEX MICROSCOPIC
Bilirubin Urine: NEGATIVE
Glucose, UA: NEGATIVE mg/dL
Ketones, ur: NEGATIVE mg/dL
Nitrite: NEGATIVE
PROTEIN: NEGATIVE mg/dL
Specific Gravity, Urine: 1.018 (ref 1.005–1.030)
UROBILINOGEN UA: 0.2 mg/dL (ref 0.0–1.0)
pH: 5.5 (ref 5.0–8.0)

## 2013-11-03 LAB — URINE MICROSCOPIC-ADD ON

## 2013-11-03 LAB — POC OCCULT BLOOD, ED: Fecal Occult Bld: POSITIVE — AB

## 2013-11-03 LAB — AMMONIA: AMMONIA: 45 umol/L (ref 11–60)

## 2013-11-03 NOTE — ED Notes (Signed)
Patient transported to CT 

## 2013-11-03 NOTE — ED Notes (Signed)
Onset one week ago fall and possible hit head by Daughter. EMS gave Fentanyl 50 mcg IV Left Wrist 20g Pain 7/10 sharp improved to 4/10 sharp. EMS reported patient alert answering and following commands. Lives at [facility].

## 2013-11-03 NOTE — ED Provider Notes (Signed)
CSN: [id number]     Arrival date & time 11/03/13  1809 History   First MD Initiated Contact with Patient 11/03/13 1819     Chief Complaint  Patient presents with  . Fall  . Hip Pain     (Consider location/radiation/quality/duration/timing/severity/associated sxs/prior Treatment) HPI 80 year old male with a history of hyperlipidemia, hypertension, A. fib, cirrhosis, who presents today with the daughter the bedside for concerns of a fall sustained approximately one week ago. According to the daughter the bedside, the patient sustained a fall getting out of his bed and struck his head at that time. The daughter is visibly upset that the patient was not brought to the emergency room at the time of the fall 1 week ago. Since that time the patient has had hip pain on the right side. Patient has a history of hip surgery on his left side. The patient has a history of dementia and confusion and is unable to provide any further history.   Past Medical History  Diagnosis Date  . Arthritis     Shoulders and knees  . Hyperlipidemia   . Hypertension   . A-fib     Dr Wynonia Lawman; WAS on Coumadin until 04/2012  . Angina     uses NTG prn  . Pneumonia     12 /2012.  [facility] pneumonia/aspiration pneumonia 2015  . Hypothyroidism     x 10 yrs  . GERD (gastroesophageal reflux disease)   . Constipation, chronic   . Depression   . History of dizziness   . Anxiety   . Urinary frequency   . Hearing loss of both ears     wears hearing aides  . BPH with obstruction/lower urinary tract symptoms     sees Dr Gaynelle Arabian  . Compression fracture of thoracic vertebra, non-traumatic 06/14/12    T6 and T7 (noted on CT angio chest to r/o PE)  . Alcoholic cirrhosis of liver with ascites 06/14/12    Noted on CT angio chest to r/o PE  . Esophageal varices in alcoholic cirrhosis "   "    "   "    "  . Intertrochanteric fracture of left hip 05/24/2013  . HCAP (healthcare-associated pneumonia) 07/13/2013  . Postoperative  anemia due to acute blood loss 05/27/2013  . CHF (congestive heart failure) 10/15/2013  . Prostate cancer 1992   Past Surgical History  Procedure Laterality Date  . Total knee arthroplasty Right 1994  . Total shoulder replacement Right 1997  . Cholecystectomy  2004  . Inguinal hernia repair  05/09/2011    Procedure: HERNIA REPAIR INGUINAL ADULT;  Surgeon: Odis Hollingshead, MD;  Location: [place];  Service: General;  Laterality: Right;  . Femur im nail Left 05/25/2013    Procedure: INTRAMEDULLARY (IM) NAIL FEMORAL;  Surgeon: Johnny Bridge, MD;  Location: [place];  Service: Orthopedics;  Laterality: Left;  . Femoral hernia repair Right 07/13/2013    Procedure: REPAIR RIGHT INCARCERATED FEMORAL HERNIA WITH MESH;  Surgeon: Stark Klein, MD;  Location: [place];  Service: General;  Laterality: Right;  . Laparotomy N/A 07/13/2013    Procedure: EXPLORATORY LAPAROTOMY;  Surgeon: Stark Klein, MD;  Location: [place];  Service: General;  Laterality: N/A;  . Bowel resection N/A 07/13/2013    Procedure: SMALL BOWEL RESECTION;  Surgeon: Stark Klein, MD;  Location: [place];  Service: General;  Laterality: N/A;  . Joint replacement    . Inguinal hernia repair Left 1990  . Inguinal hernia repair Right  04/2011  . Cataract extraction w/ intraocular lens  implant, bilateral Bilateral 2013  . Transurethral resection of prostate  1992   Family History  Problem Relation Age of Onset  . Alcoholism Father   . Cirrhosis Father    History  Substance Use Topics  . Smoking status: Former Smoker -- 0.50 packs/day for 35 years    Types: Cigarettes    Quit date: 05/02/1984  . Smokeless tobacco: Current User    Types: Chew  . Alcohol Use: No    Review of Systems  Unable to perform ROS: Dementia      Allergies  Amitriptyline; Beta adrenergic blockers; Imdur; Niacin and related; and Sulfa antibiotics  Home Medications   Prior to Admission medications   Medication Sig Start Date End Date Taking? Authorizing Provider   aspirin EC 81 MG tablet Take 81 mg by mouth at bedtime.   Yes Historical Provider, MD  finasteride (PROSCAR) 5 MG tablet Take 5 mg by mouth at bedtime.  05/01/12  Yes Historical Provider, MD  furosemide (LASIX) 40 MG tablet Take 1 tablet (40 mg total) by mouth daily. 10/17/13  Yes Reyne Dumas, MD  guaiFENesin-dextromethorphan (ROBITUSSIN DM) 100-10 MG/5ML syrup Take 5 mLs by mouth every 4 (four) hours as needed for cough. 10/17/13  Yes Reyne Dumas, MD  HYDROcodone-acetaminophen (NORCO/VICODIN) 5-325 MG per tablet Take 1 tablet by mouth every 4 (four) hours as needed (pain).   Yes Historical Provider, MD  lactulose (CHRONULAC) 10 GM/15ML solution Take 30 g by mouth daily.   Yes Historical Provider, MD  levalbuterol Penne Lash) 0.63 MG/3ML nebulizer solution Take 3 mLs (0.63 mg total) by nebulization every 6 (six) hours as needed for wheezing or shortness of breath. 10/17/13  Yes Reyne Dumas, MD  levofloxacin (LEVAQUIN) 750 MG tablet Take 750 mg by mouth daily.   Yes Historical Provider, MD  levothyroxine (SYNTHROID, LEVOTHROID) 100 MCG tablet Take 100 mcg by mouth daily before breakfast.   Yes Historical Provider, MD  metoprolol tartrate (LOPRESSOR) 25 MG tablet Take 0.5 tablets (12.5 mg total) by mouth 2 (two) times daily. 07/21/13  Yes Shanker Kristeen Mans, MD  mirtazapine (REMERON) 30 MG tablet Take 30 mg by mouth at bedtime.  01/04/11  Yes Historical Provider, MD  nitroGLYCERIN (NITROSTAT) 0.4 MG SL tablet Place 0.4 mg under the tongue every 5 (five) minutes as needed for chest pain. Give 1 tablet every 5 minutes for chest pain x 3 doses.  If unrelieved after 3 doses, call MD   Yes Historical Provider, MD  pantoprazole (PROTONIX) 40 MG tablet Take 40 mg by mouth daily.  01/24/11  Yes Historical Provider, MD  vitamin B-12 (CYANOCOBALAMIN) 1000 MCG tablet Take 1,000 mcg by mouth daily.   Yes Historical Provider, MD   BP 110/58  Pulse 92  Temp(Src) 99.5 F (37.5 C) (Oral)  Resp 14  Ht 5\' 6"  (1.676 m)   Wt 150 lb (68.04 kg)  BMI 24.22 kg/m2  SpO2 97% Physical Exam  Constitutional: He is oriented to person, place, and time. He appears well-developed and well-nourished. He appears cachectic. He has a sickly appearance.  HENT:  Head: Normocephalic. Head is with contusion (contusion on R temple).  Eyes: Pupils are equal, round, and reactive to light.  Neck: Normal range of motion.  Cardiovascular: Normal rate and regular rhythm.   Pulmonary/Chest: Effort normal and breath sounds normal.  Abdominal: Soft. He exhibits no distension. There is no tenderness.  Musculoskeletal: Normal range of motion.  Right hip: He exhibits tenderness and bony tenderness.  Neurological: He is alert and oriented to person, place, and time.  Skin: Skin is warm. He is not diaphoretic.    ED Course  Procedures (including critical care time) Labs Review Labs Reviewed  CBC - Abnormal; Notable for the following:    RBC 2.78 (*)    Hemoglobin 8.7 (*)    HCT 25.7 (*)    All other components within normal limits  BASIC METABOLIC PANEL - Abnormal; Notable for the following:    Glucose, Bld 104 (*)    BUN 25 (*)    Calcium 7.8 (*)    GFR calc non Af Amer 57 (*)    GFR calc Af Amer 66 (*)    All other components within normal limits  URINALYSIS, ROUTINE W REFLEX MICROSCOPIC - Abnormal; Notable for the following:    Color, Urine AMBER (*)    APPearance CLOUDY (*)    Hgb urine dipstick LARGE (*)    Leukocytes, UA LARGE (*)    All other components within normal limits  URINE MICROSCOPIC-ADD ON - Abnormal; Notable for the following:    Bacteria, UA FEW (*)    Casts HYALINE CASTS (*)    All other components within normal limits  POC OCCULT BLOOD, ED - Abnormal; Notable for the following:    Fecal Occult Bld POSITIVE (*)    All other components within normal limits  URINE CULTURE  AMMONIA  PROTIME-INR  TYPE AND SCREEN    Imaging Review Dg Chest 2 View  11/03/2013   CLINICAL DATA:  Status post fall   EXAM: CHEST  2 VIEW  COMPARISON:  10/15/2013  FINDINGS: There is no focal parenchymal opacity, pleural effusion, or pneumothorax. The heart and mediastinal contours are unremarkable.  There is a right shoulder arthroplasty. 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  IMPRESSION: No active cardiopulmonary disease.   Electronically Signed   By: Kathreen Devoid   On: 11/03/2013 20:18   Dg Hip Complete Right  11/03/2013   CLINICAL DATA:  Right hip pain.  Fall 1 week ago.  EXAM: RIGHT HIP - COMPLETE 2+ VIEW  COMPARISON:  09/03/2013  FINDINGS: Left femur IM nail and helical hip screw noted. This traverses the prior intertrochanteric fracture.  Acute fracture, right pubic body and adjacent portions of the superior and inferior pubic rami. Possible fracture of the right sacrum. Bony demineralization.  In addition, there is bony discontinuity in the right lateral trochanter suspicious for at least a trochanteric avulsion fracture.  IMPRESSION: 1. Acute fractures of the right pubic rami and adjacent pubic body. 2. Probable fracture of the right greater trochanter, possibly avulsion, conceivably a subtle intertrochanteric fracture. 3. Possible right sacral fracture. 4. Old left hip fracture traversed by a helical hip screw.   Electronically Signed   By: Sherryl Barters M.D.   On: 11/03/2013 20:24   Ct Head Wo Contrast  11/03/2013   CLINICAL DATA:  Status post fall  EXAM: CT HEAD WITHOUT CONTRAST  TECHNIQUE: Contiguous axial images were obtained from the base of the skull through the vertex without intravenous contrast.  COMPARISON:  10/15/2013  FINDINGS: There is no evidence of mass effect, midline shift, or extra-axial fluid collections. There is no evidence of a space-occupying lesion or intracranial hemorrhage. There is no evidence of a cortical-based area of acute infarction. There is generalized cerebral atrophy. There is periventricular white matter low attenuation likely secondary to microangiopathy.  The  ventricles and sulci  are appropriate for the patient's age. The basal cisterns are patent.  Visualized portions of the orbits are unremarkable. The visualized portions of the paranasal sinuses and mastoid air cells are unremarkable. Cerebrovascular atherosclerotic calcifications are noted.  The osseous structures are unremarkable.  IMPRESSION: No acute intracranial pathology.   Electronically Signed   By: Kathreen Devoid   On: 11/03/2013 19:43   Ct Pelvis Wo Contrast  11/03/2013   CLINICAL DATA:  Fall with hip pain  EXAM: CT PELVIS WITHOUT CONTRAST  TECHNIQUE: Multidetector CT imaging of the pelvis was performed following the standard protocol without intravenous contrast.  COMPARISON:  Pelvis radiograph from the same day  FINDINGS: Right superior and inferior pubic ramus fractures with mild bony impaction. The fractures continue into the right pubic body. There is a nondisplaced fracture through the right sacral ala, not affecting the sacral foramina.  There is a comminuted, nondisplaced fracture involving the right greater trochanter. No visible continuation into the intertrochanteric region or femoral neck.  Remote intertrochanteric left femur fracture status post ORIF. There is no evidence of acute hardware complication. Chronic loose body in the anterior left hip joint measuring 16 mm.  Spiculated sclerotic focus in the posterior left ilium is stable from prior, morphology favoring bone island.  Neighboring the superior pubic ramus fracture is a extraperitoneal hematoma which mildly deforms the right bladder. There is no visible bladder wall thickening to suggests injury. Ascites in the lower abdomen in this patient with known cirrhosis. Small volume pneumaturia, usually from manipulation.  IMPRESSION: 1. Mildly impacted right obturator ring fractures. 2. Nondisplaced right sacral ala fracture. No sacral foramina involvement. 3. Nondisplaced greater trochanter fracture of the right femur. No visible  continuation into the intertrochanteric or femoral neck regions. 4. Chronic findings are noted above.   Electronically Signed   By: Jorje Guild M.D.   On: 11/03/2013 22:58     EKG Interpretation None      MDM   Final diagnoses:  Hip fracture, right, closed, initial encounter  UTI (lower urinary tract infection)  Anemia, unspecified anemia type  Fall, initial encounter   80 year old male who sustained a fall approximately one week ago presents with the daughter the bedside for evaluation of possible head trauma and possible head trauma.  CT scan of the head demonstrates no acute intracranial pathology at this time. Chest x-ray performed demonstrates no active cardia pulmonary disease. X-ray of the head demonstrates acute fractures of the right pubic rami and adjacent pubic body with possible fracture the right greater trochanter. I spoke with the orthopedist concerning the findings, and he recommends that the patient obtain a CT scan to further characterize the injuries. CT scan demonstrates obturator ring fractures, right sacral ala fractures, and a nondisplaced greater trochanteric fracture. I spoke again with orthopedics who feels that this time the injuries are not operative.  On routine evaluation of the patient and giving labs, was found the patient's hemoglobin is 8.7. A fecal occult blood test was also checked and was positive. Given the patient's recent weakness and confusion, there is concern for possible lower GI bleed. Given the patient's need for inpatient pain control and physical therapy as well as his lower GI bleed, I spoke with the hospitalist on call for evaluation and for admission for further treatment. Patient will be admitted to the floor. Patient seen and evaluated by myself and by the attending Dr. Reather Converse.    Freddi Che, MD 11/04/13 [phone]

## 2013-11-04 ENCOUNTER — Encounter (HOSPITAL_COMMUNITY): Payer: Self-pay | Admitting: Internal Medicine

## 2013-11-04 DIAGNOSIS — Z8546 Personal history of malignant neoplasm of prostate: Secondary | ICD-10-CM | POA: Diagnosis not present

## 2013-11-04 DIAGNOSIS — E785 Hyperlipidemia, unspecified: Secondary | ICD-10-CM | POA: Diagnosis present

## 2013-11-04 DIAGNOSIS — I509 Heart failure, unspecified: Secondary | ICD-10-CM

## 2013-11-04 DIAGNOSIS — N139 Obstructive and reflux uropathy, unspecified: Secondary | ICD-10-CM | POA: Diagnosis present

## 2013-11-04 DIAGNOSIS — Z7982 Long term (current) use of aspirin: Secondary | ICD-10-CM | POA: Diagnosis not present

## 2013-11-04 DIAGNOSIS — N390 Urinary tract infection, site not specified: Secondary | ICD-10-CM

## 2013-11-04 DIAGNOSIS — E039 Hypothyroidism, unspecified: Secondary | ICD-10-CM | POA: Diagnosis present

## 2013-11-04 DIAGNOSIS — F0390 Unspecified dementia without behavioral disturbance: Secondary | ICD-10-CM | POA: Diagnosis present

## 2013-11-04 DIAGNOSIS — Z87891 Personal history of nicotine dependence: Secondary | ICD-10-CM | POA: Diagnosis not present

## 2013-11-04 DIAGNOSIS — M25559 Pain in unspecified hip: Secondary | ICD-10-CM | POA: Diagnosis present

## 2013-11-04 DIAGNOSIS — Z96619 Presence of unspecified artificial shoulder joint: Secondary | ICD-10-CM | POA: Diagnosis not present

## 2013-11-04 DIAGNOSIS — Z96659 Presence of unspecified artificial knee joint: Secondary | ICD-10-CM | POA: Diagnosis not present

## 2013-11-04 DIAGNOSIS — D649 Anemia, unspecified: Secondary | ICD-10-CM

## 2013-11-04 DIAGNOSIS — D638 Anemia in other chronic diseases classified elsewhere: Secondary | ICD-10-CM | POA: Diagnosis present

## 2013-11-04 DIAGNOSIS — S3210XA Unspecified fracture of sacrum, initial encounter for closed fracture: Secondary | ICD-10-CM | POA: Diagnosis present

## 2013-11-04 DIAGNOSIS — H9190 Unspecified hearing loss, unspecified ear: Secondary | ICD-10-CM | POA: Diagnosis present

## 2013-11-04 DIAGNOSIS — S72009A Fracture of unspecified part of neck of unspecified femur, initial encounter for closed fracture: Secondary | ICD-10-CM

## 2013-11-04 DIAGNOSIS — S72109A Unspecified trochanteric fracture of unspecified femur, initial encounter for closed fracture: Secondary | ICD-10-CM | POA: Diagnosis present

## 2013-11-04 DIAGNOSIS — N401 Enlarged prostate with lower urinary tract symptoms: Secondary | ICD-10-CM | POA: Diagnosis present

## 2013-11-04 DIAGNOSIS — I4891 Unspecified atrial fibrillation: Secondary | ICD-10-CM | POA: Diagnosis present

## 2013-11-04 DIAGNOSIS — F411 Generalized anxiety disorder: Secondary | ICD-10-CM | POA: Diagnosis present

## 2013-11-04 DIAGNOSIS — W010XXA Fall on same level from slipping, tripping and stumbling without subsequent striking against object, initial encounter: Secondary | ICD-10-CM | POA: Diagnosis present

## 2013-11-04 DIAGNOSIS — F3289 Other specified depressive episodes: Secondary | ICD-10-CM | POA: Diagnosis present

## 2013-11-04 DIAGNOSIS — G9340 Encephalopathy, unspecified: Secondary | ICD-10-CM | POA: Diagnosis present

## 2013-11-04 DIAGNOSIS — S32509A Unspecified fracture of unspecified pubis, initial encounter for closed fracture: Secondary | ICD-10-CM | POA: Diagnosis present

## 2013-11-04 DIAGNOSIS — K7030 Alcoholic cirrhosis of liver without ascites: Secondary | ICD-10-CM | POA: Diagnosis present

## 2013-11-04 DIAGNOSIS — Z66 Do not resuscitate: Secondary | ICD-10-CM | POA: Diagnosis present

## 2013-11-04 DIAGNOSIS — F329 Major depressive disorder, single episode, unspecified: Secondary | ICD-10-CM | POA: Diagnosis present

## 2013-11-04 DIAGNOSIS — I5032 Chronic diastolic (congestive) heart failure: Secondary | ICD-10-CM

## 2013-11-04 DIAGNOSIS — S329XXA Fracture of unspecified parts of lumbosacral spine and pelvis, initial encounter for closed fracture: Secondary | ICD-10-CM

## 2013-11-04 DIAGNOSIS — IMO0002 Reserved for concepts with insufficient information to code with codable children: Secondary | ICD-10-CM | POA: Diagnosis not present

## 2013-11-04 DIAGNOSIS — R64 Cachexia: Secondary | ICD-10-CM | POA: Diagnosis present

## 2013-11-04 DIAGNOSIS — K219 Gastro-esophageal reflux disease without esophagitis: Secondary | ICD-10-CM | POA: Diagnosis present

## 2013-11-04 DIAGNOSIS — I10 Essential (primary) hypertension: Secondary | ICD-10-CM

## 2013-11-04 LAB — COMPREHENSIVE METABOLIC PANEL
ALK PHOS: 187 U/L — AB (ref 39–117)
ALT: 12 U/L (ref 0–53)
AST: 33 U/L (ref 0–37)
Albumin: 1.5 g/dL — ABNORMAL LOW (ref 3.5–5.2)
Anion gap: 10 (ref 5–15)
BUN: 24 mg/dL — ABNORMAL HIGH (ref 6–23)
CO2: 24 mEq/L (ref 19–32)
Calcium: 7.7 mg/dL — ABNORMAL LOW (ref 8.4–10.5)
Chloride: 100 mEq/L (ref 96–112)
Creatinine, Ser: 1.09 mg/dL (ref 0.50–1.35)
GFR calc non Af Amer: 59 mL/min — ABNORMAL LOW (ref >90)
GFR, EST AFRICAN AMERICAN: 68 mL/min — AB (ref >90)
Glucose, Bld: 121 mg/dL — ABNORMAL HIGH (ref 70–99)
POTASSIUM: 3.9 meq/L (ref 3.7–5.3)
SODIUM: 134 meq/L — AB (ref 137–147)
TOTAL PROTEIN: 4.8 g/dL — AB (ref 6.0–8.3)
Total Bilirubin: 0.7 mg/dL (ref 0.3–1.2)

## 2013-11-04 LAB — PHOSPHORUS: Phosphorus: 2.7 mg/dL (ref 2.3–4.6)

## 2013-11-04 LAB — MAGNESIUM: MAGNESIUM: 2.2 mg/dL (ref 1.5–2.5)

## 2013-11-04 LAB — CBC
HCT: 25.5 % — ABNORMAL LOW (ref 39.0–52.0)
HEMOGLOBIN: 8.7 g/dL — AB (ref 13.0–17.0)
MCH: 31.6 pg (ref 26.0–34.0)
MCHC: 34.1 g/dL (ref 30.0–36.0)
MCV: 92.7 fL (ref 78.0–100.0)
Platelets: 147 10*3/uL — ABNORMAL LOW (ref 150–400)
RBC: 2.75 MIL/uL — ABNORMAL LOW (ref 4.22–5.81)
RDW: 15.3 % (ref 11.5–15.5)
WBC: 7.9 10*3/uL (ref 4.0–10.5)

## 2013-11-04 LAB — PROTIME-INR
INR: 1.53 — AB (ref 0.00–1.49)
PROTHROMBIN TIME: 18.4 s — AB (ref 11.6–15.2)

## 2013-11-04 LAB — TSH: TSH: 5.22 u[IU]/mL — ABNORMAL HIGH (ref 0.350–4.500)

## 2013-11-04 LAB — RETICULOCYTES
RBC.: 2.68 MIL/uL — ABNORMAL LOW (ref 4.22–5.81)
Retic Count, Absolute: 48.2 10*3/uL (ref 19.0–186.0)
Retic Ct Pct: 1.8 % (ref 0.4–3.1)

## 2013-11-04 MED ORDER — PANTOPRAZOLE SODIUM 40 MG PO TBEC
40.0000 mg | DELAYED_RELEASE_TABLET | Freq: Every day | ORAL | Status: DC
  Administered 2013-11-04 – 2013-11-05 (×2): 40 mg via ORAL
  Filled 2013-11-04 (×2): qty 1

## 2013-11-04 MED ORDER — METHOCARBAMOL 500 MG PO TABS
500.0000 mg | ORAL_TABLET | Freq: Four times a day (QID) | ORAL | Status: DC | PRN
Start: 2013-11-04 — End: 2013-11-05

## 2013-11-04 MED ORDER — DOCUSATE SODIUM 100 MG PO CAPS
100.0000 mg | ORAL_CAPSULE | Freq: Two times a day (BID) | ORAL | Status: DC
  Administered 2013-11-04: 100 mg via ORAL

## 2013-11-04 MED ORDER — HYDROCODONE-ACETAMINOPHEN 5-325 MG PO TABS
1.0000 | ORAL_TABLET | ORAL | Status: DC | PRN
  Administered 2013-11-04 – 2013-11-06 (×3): 1 via ORAL
  Filled 2013-11-04: qty 2
  Filled 2013-11-04 (×3): qty 1

## 2013-11-04 MED ORDER — LACTULOSE 10 GM/15ML PO SOLN
30.0000 g | Freq: Every day | ORAL | Status: DC
  Administered 2013-11-04 – 2013-11-07 (×4): 30 g via ORAL
  Filled 2013-11-04 (×4): qty 45

## 2013-11-04 MED ORDER — DEXTROSE 5 % IV SOLN
1.0000 g | Freq: Once | INTRAVENOUS | Status: AC
  Administered 2013-11-04: 1 g via INTRAVENOUS
  Filled 2013-11-04: qty 10

## 2013-11-04 MED ORDER — METOPROLOL TARTRATE 12.5 MG HALF TABLET
12.5000 mg | ORAL_TABLET | Freq: Two times a day (BID) | ORAL | Status: DC
  Filled 2013-11-04 (×2): qty 1

## 2013-11-04 MED ORDER — DEXTROSE 5 % IV SOLN: 500.0000 mg | Freq: Four times a day (QID) | INTRAVENOUS | Status: DC | PRN

## 2013-11-04 MED ORDER — LEVOTHYROXINE SODIUM 100 MCG PO TABS
100.0000 ug | ORAL_TABLET | Freq: Every day | ORAL | Status: DC
  Administered 2013-11-04 – 2013-11-07 (×4): 100 ug via ORAL
  Filled 2013-11-04 (×6): qty 1

## 2013-11-04 MED ORDER — SODIUM CHLORIDE 0.9 % IV SOLN
INTRAVENOUS | Status: DC
Start: 2013-11-04 — End: 2013-11-04

## 2013-11-04 MED ORDER — ACETAMINOPHEN 325 MG PO TABS
650.0000 mg | ORAL_TABLET | Freq: Four times a day (QID) | ORAL | Status: DC | PRN
  Administered 2013-11-05: 650 mg via ORAL
  Filled 2013-11-04: qty 2

## 2013-11-04 MED ORDER — ONDANSETRON HCL 4 MG PO TABS
4.0000 mg | ORAL_TABLET | Freq: Four times a day (QID) | ORAL | Status: DC | PRN
Start: 2013-11-04 — End: 2013-11-05

## 2013-11-04 MED ORDER — BISACODYL 10 MG RE SUPP: 10.0000 mg | Freq: Every day | RECTAL | Status: DC | PRN

## 2013-11-04 MED ORDER — ONDANSETRON HCL 4 MG/2ML IJ SOLN: 4.0000 mg | Freq: Four times a day (QID) | INTRAMUSCULAR | Status: DC | PRN

## 2013-11-04 MED ORDER — NITROGLYCERIN 0.4 MG SL SUBL: 0.4000 mg | SUBLINGUAL_TABLET | SUBLINGUAL | Status: DC | PRN

## 2013-11-04 MED ORDER — METOPROLOL TARTRATE 12.5 MG HALF TABLET
12.5000 mg | ORAL_TABLET | Freq: Two times a day (BID) | ORAL | Status: DC
  Administered 2013-11-04 – 2013-11-07 (×4): 12.5 mg via ORAL
  Filled 2013-11-04 (×7): qty 1

## 2013-11-04 MED ORDER — DOCUSATE SODIUM 100 MG PO CAPS
100.0000 mg | ORAL_CAPSULE | Freq: Two times a day (BID) | ORAL | Status: DC
  Administered 2013-11-04 – 2013-11-06 (×6): 100 mg via ORAL
  Filled 2013-11-04 (×8): qty 1

## 2013-11-04 MED ORDER — FLEET ENEMA 7-19 GM/118ML RE ENEM: 1.0000 | ENEMA | Freq: Once | RECTAL | Status: AC | PRN

## 2013-11-04 MED ORDER — FUROSEMIDE 40 MG PO TABS
40.0000 mg | ORAL_TABLET | Freq: Every day | ORAL | Status: DC
  Administered 2013-11-04 – 2013-11-07 (×3): 40 mg via ORAL
  Filled 2013-11-04 (×4): qty 1

## 2013-11-04 MED ORDER — GUAIFENESIN-DM 100-10 MG/5ML PO SYRP
5.0000 mL | ORAL_SOLUTION | ORAL | Status: DC | PRN
  Filled 2013-11-04: qty 5

## 2013-11-04 MED ORDER — FINASTERIDE 5 MG PO TABS
5.0000 mg | ORAL_TABLET | Freq: Every day | ORAL | Status: DC
  Administered 2013-11-04 – 2013-11-06 (×3): 5 mg via ORAL
  Filled 2013-11-04 (×4): qty 1

## 2013-11-04 MED ORDER — ACETAMINOPHEN 650 MG RE SUPP: 650.0000 mg | Freq: Four times a day (QID) | RECTAL | Status: DC | PRN

## 2013-11-04 MED ORDER — MIRTAZAPINE 30 MG PO TABS
30.0000 mg | ORAL_TABLET | Freq: Every day | ORAL | Status: DC
  Administered 2013-11-04 – 2013-11-06 (×4): 30 mg via ORAL
  Filled 2013-11-04 (×5): qty 1

## 2013-11-04 MED ORDER — MORPHINE SULFATE 2 MG/ML IJ SOLN: 2.0000 mg | INTRAMUSCULAR | Status: DC | PRN

## 2013-11-04 MED ORDER — ASPIRIN EC 81 MG PO TBEC
81.0000 mg | DELAYED_RELEASE_TABLET | Freq: Every day | ORAL | Status: DC
  Administered 2013-11-04 – 2013-11-06 (×3): 81 mg via ORAL
  Filled 2013-11-04 (×4): qty 1

## 2013-11-04 MED ORDER — SENNA 8.6 MG PO TABS
1.0000 | ORAL_TABLET | Freq: Two times a day (BID) | ORAL | Status: DC
  Administered 2013-11-04 – 2013-11-06 (×6): 8.6 mg via ORAL
  Filled 2013-11-04 (×8): qty 1

## 2013-11-04 MED ORDER — LEVALBUTEROL HCL 0.63 MG/3ML IN NEBU
0.6300 mg | INHALATION_SOLUTION | Freq: Four times a day (QID) | RESPIRATORY_TRACT | Status: DC | PRN
  Filled 2013-11-04: qty 3

## 2013-11-04 MED ORDER — POLYETHYLENE GLYCOL 3350 17 G PO PACK: 17.0000 g | PACK | Freq: Every day | ORAL | Status: DC | PRN

## 2013-11-04 MED ORDER — DEXTROSE 5 % IV SOLN
1.0000 g | INTRAVENOUS | Status: DC
  Administered 2013-11-04 – 2013-11-07 (×3): 1 g via INTRAVENOUS
  Filled 2013-11-04 (×4): qty 10

## 2013-11-04 NOTE — H&P (Signed)
PCP:   Tammi Sou, MD    Chief Complaint:  Hx of fall  HPI: Raymond Shahan Sr. is a 80 y.o. male   has a past medical history of Arthritis; Hyperlipidemia; Hypertension; A-fib; Angina; Pneumonia; Hypothyroidism; GERD (gastroesophageal reflux disease); Constipation, chronic; Depression; History of dizziness; Anxiety; Urinary frequency; Hearing loss of both ears; BPH with obstruction/lower urinary tract symptoms; Compression fracture of thoracic vertebra, non-traumatic (06/14/12); Alcoholic cirrhosis of liver with ascites (06/14/12); Esophageal varices in alcoholic cirrhosis ("   "); Intertrochanteric fracture of left hip (05/24/2013); HCAP (healthcare-associated pneumonia) (07/13/2013); Postoperative anemia due to acute blood loss (05/27/2013); CHF (congestive heart failure) (10/15/2013); and Prostate cancer (1992).   Presented with  Patient resides at [facility]. Last week he has fallen while trying to reach for something. His family noted the bruises on 7/13 and brought him to ER. IN ER he had plain imaging done worrisome for pelvic fracture and great trochanteric fracture. CT confirmed. Orthopedics have been consulted and felt it was non-operative. Patient was noted to have hg down to 8.7 he reports hx of internal hemorrhoids with occasional small amount of blood in stool.   Family states he has been confused with intermittently elevated Ammonia levels while at SNF.  He has bee recently treated with Levaquin for UTI but was found to have persistent UTI in ER and started on rocephin.   Hospitalist was called for admission for pain control due to numerous non operative fractures in elderly  Review of Systems:    Pertinent positives include:   weight loss,  generalized weakness, blood in stool, confusion fatigue,  Constitutional:  No weight loss, night sweats, Fevers, chills, HEENT:  No headaches, Difficulty swallowing,Tooth/dental problems,Sore throat,  No sneezing, itching,  ear ache, nasal congestion, post nasal drip,  Cardio-vascular:  No chest pain, Orthopnea, PND, anasarca, dizziness, palpitations.no Bilateral lower extremity swelling  GI:  No heartburn, indigestion, abdominal pain, nausea, vomiting, diarrhea, change in bowel habits, loss of appetite, melena, hematemesis Resp:  no shortness of breath at rest. No dyspnea on exertion, No excess mucus, no productive cough, No non-productive cough, No coughing up of blood.No change in color of mucus.No wheezing. Skin:  no rash or lesions. No jaundice GU:  no dysuria, change in color of urine, no urgency or frequency. No straining to urinate.  No flank pain.  Musculoskeletal:  No joint pain or no joint swelling. No decreased range of motion. No back pain.  Psych:  No change in mood or affect. No depression or anxiety. No memory loss.  Neuro: no localizing neurological complaints, no tingling, no weakness, no double vision, no gait abnormality, no slurred speech, no   Otherwise ROS are negative except for above, 10 systems were reviewed  Past Medical History: Past Medical History  Diagnosis Date  . Arthritis     Shoulders and knees  . Hyperlipidemia   . Hypertension   . A-fib     Dr Wynonia Lawman; WAS on Coumadin until 04/2012  . Angina     uses NTG prn  . Pneumonia     12 /2012.  HC assoc pneumonia/aspiration pneumonia 2015  . Hypothyroidism     x 10 yrs  . GERD (gastroesophageal reflux disease)   . Constipation, chronic   . Depression   . History of dizziness   . Anxiety   . Urinary frequency   . Hearing loss of both ears     wears hearing aides  . BPH with obstruction/lower urinary tract  symptoms     sees Dr Gaynelle Arabian  . Compression fracture of thoracic vertebra, non-traumatic 06/14/12    T6 and T7 (noted on CT angio chest to r/o PE)  . Alcoholic cirrhosis of liver with ascites 06/14/12    Noted on CT angio chest to r/o PE  . Esophageal varices in alcoholic cirrhosis "   "    "   "    "  .  Intertrochanteric fracture of left hip 05/24/2013  . HCAP (healthcare-associated pneumonia) 07/13/2013  . Postoperative anemia due to acute blood loss 05/27/2013  . CHF (congestive heart failure) 10/15/2013  . Prostate cancer 1992   Past Surgical History  Procedure Laterality Date  . Total knee arthroplasty Right 1994  . Total shoulder replacement Right 1997  . Cholecystectomy  2004  . Inguinal hernia repair  05/09/2011    Procedure: HERNIA REPAIR INGUINAL ADULT;  Surgeon: Odis Hollingshead, MD;  Location: [facility];  Service: General;  Laterality: Right;  . Femur im nail Left 05/25/2013    Procedure: INTRAMEDULLARY (IM) NAIL FEMORAL;  Surgeon: Johnny Bridge, MD;  Location: [facility];  Service: Orthopedics;  Laterality: Left;  . Femoral hernia repair Right 07/13/2013    Procedure: REPAIR RIGHT INCARCERATED FEMORAL HERNIA WITH MESH;  Surgeon: Stark Klein, MD;  Location: [place];  Service: General;  Laterality: Right;  . Laparotomy N/A 07/13/2013    Procedure: EXPLORATORY LAPAROTOMY;  Surgeon: Stark Klein, MD;  Location: [place];  Service: General;  Laterality: N/A;  . Bowel resection N/A 07/13/2013    Procedure: SMALL BOWEL RESECTION;  Surgeon: Stark Klein, MD;  Location: [place];  Service: General;  Laterality: N/A;  . Joint replacement    . Inguinal hernia repair Left 1990  . Inguinal hernia repair Right 04/2011  . Cataract extraction w/ intraocular lens  implant, bilateral Bilateral 2013  . Transurethral resection of prostate  1992     Medications: Prior to Admission medications   Medication Sig Start Date End Date Taking? Authorizing Provider  aspirin EC 81 MG tablet Take 81 mg by mouth at bedtime.   Yes Historical Provider, MD  finasteride (PROSCAR) 5 MG tablet Take 5 mg by mouth at bedtime.  05/01/12  Yes Historical Provider, MD  furosemide (LASIX) 40 MG tablet Take 1 tablet (40 mg total) by mouth daily. 10/17/13  Yes Reyne Dumas, MD  guaiFENesin-dextromethorphan (ROBITUSSIN DM) 100-10 MG/5ML syrup  Take 5 mLs by mouth every 4 (four) hours as needed for cough. 10/17/13  Yes Reyne Dumas, MD  HYDROcodone-acetaminophen (NORCO/VICODIN) 5-325 MG per tablet Take 1 tablet by mouth every 4 (four) hours as needed (pain).   Yes Historical Provider, MD  lactulose (CHRONULAC) 10 GM/15ML solution Take 30 g by mouth daily.   Yes Historical Provider, MD  levalbuterol Penne Lash) 0.63 MG/3ML nebulizer solution Take 3 mLs (0.63 mg total) by nebulization every 6 (six) hours as needed for wheezing or shortness of breath. 10/17/13  Yes Reyne Dumas, MD  levofloxacin (LEVAQUIN) 750 MG tablet Take 750 mg by mouth daily.   Yes Historical Provider, MD  levothyroxine (SYNTHROID, LEVOTHROID) 100 MCG tablet Take 100 mcg by mouth daily before breakfast.   Yes Historical Provider, MD  metoprolol tartrate (LOPRESSOR) 25 MG tablet Take 0.5 tablets (12.5 mg total) by mouth 2 (two) times daily. 07/21/13  Yes Shanker Kristeen Mans, MD  mirtazapine (REMERON) 30 MG tablet Take 30 mg by mouth at bedtime.  01/04/11  Yes Historical Provider, MD  nitroGLYCERIN (NITROSTAT) 0.4 MG  SL tablet Place 0.4 mg under the tongue every 5 (five) minutes as needed for chest pain. Give 1 tablet every 5 minutes for chest pain x 3 doses.  If unrelieved after 3 doses, call MD   Yes Historical Provider, MD  pantoprazole (PROTONIX) 40 MG tablet Take 40 mg by mouth daily.  01/24/11  Yes Historical Provider, MD  vitamin B-12 (CYANOCOBALAMIN) 1000 MCG tablet Take 1,000 mcg by mouth daily.   Yes Historical Provider, MD    Allergies:   Allergies  Allergen Reactions  . Amitriptyline Other (See Comments)    Dizziness   . Beta Adrenergic Blockers Other (See Comments)    lethargy  . Imdur [Isosorbide]     Headache   . Niacin And Related Rash       . Sulfa Antibiotics Itching, Nausea And Vomiting and Rash    Social History:  Ambulatory  walker cane,     From facility [facility].   reports that he quit smoking about 29 years ago. His  smoking use included Cigarettes. He has a 17.5 pack-year smoking history. His smokeless tobacco use includes Chew. He reports that he does not drink alcohol or use illicit drugs.    Family History: family history includes Alcoholism in his father; Cirrhosis in his father.    Physical Exam: Patient Vitals for the past 24 hrs:  BP Temp Temp src Pulse Resp SpO2 Height Weight  11/03/13 2356 118/74 mmHg - - 87 - 97 % - -  11/03/13 2300 105/60 mmHg - - 89 17 97 % - -  11/03/13 2231 110/64 mmHg - - 84 16 98 % - -  11/03/13 2145 101/57 mmHg - - 85 14 97 % - -  11/03/13 2130 108/60 mmHg - - 87 12 97 % - -  11/03/13 2115 116/64 mmHg - - 89 16 98 % - -  11/03/13 2100 116/65 mmHg - - 88 15 97 % - -  11/03/13 2045 109/63 mmHg - - 91 16 97 % - -  11/03/13 2030 114/60 mmHg - - 87 13 96 % - -  11/03/13 2015 119/66 mmHg - - 90 12 98 % - -  11/03/13 1908 95/50 mmHg - - 90 21 97 % - -  11/03/13 1845 95/58 mmHg - - 88 20 96 % - -  11/03/13 1824 - - - - - - 5\' 6"  (1.676 m) 68.04 kg (150 lb)  11/03/13 1818 92/59 mmHg 99.5 F (37.5 C) Oral 109 - 94 % - -  11/03/13 1815 - - - - - 94 % - -    1. General:  in No Acute distress 2. Psychological: Alert and   Oriented to self and situation 3. Head/ENT:     Dry Mucous Membranes                          Head Non traumatic, neck supple                            Poor Dentition 4. SKIN:  decreased Skin turgor,  Skin clean Dry and intact no rash 5. Heart: Regular rate and rhythm no Murmur, Rub or gallop 6. Lungs: Clear to auscultation bilaterally, no wheezes or crackles   7. Abdomen: Soft, non-tender, Non distended 8. Lower extremities: no clubbing, cyanosis, mild edema 9. Neurologically Grossly intact, moving all 4 extremities equally 10. MSK: Normal range of  motion  body mass index is 24.22 kg/(m^2).   Labs on Admission:   Recent Labs  11/03/13 1853  NA 139  K 4.1  CL 105  CO2 26  GLUCOSE 104*  BUN 25*  CREATININE 1.12  CALCIUM 7.8*   No  results found for this basename: AST, ALT, ALKPHOS, BILITOT, PROT, ALBUMIN,  in the last 72 hours No results found for this basename: LIPASE, AMYLASE,  in the last 72 hours  Recent Labs  11/03/13 1853  WBC 8.7  HGB 8.7*  HCT 25.7*  MCV 92.4  PLT 155   No results found for this basename: CKTOTAL, CKMB, CKMBINDEX, TROPONINI,  in the last 72 hours No results found for this basename: TSH, T4TOTAL, FREET3, T3FREE, THYROIDAB,  in the last 72 hours No results found for this basename: VITAMINB12, FOLATE, FERRITIN, TIBC, IRON, RETICCTPCT,  in the last 72 hours No results found for this basename: HGBA1C    Estimated Creatinine Clearance: 41.1 ml/min (by C-G formula based on Cr of 1.12). ABG    Component Value Date/Time   TCO2 26 09/03/2013 1702     No results found for this basename: DDIMER     Other results:  I have pearsonaly reviewed this: ECG REPORT  Rate: 90  Rhythm: SR ST&T Change: no ischemic changes QTC 486  UA evidence of UTI  BNP (last 3 results)  Recent Labs  10/15/13 1012  PROBNP 734.1*    Filed Weights   11/03/13 1824  Weight: 68.04 kg (150 lb)     Cultures:    Component Value Date/Time   SDES FLUID ABDOMEN ASCITIC 09/04/2013 1435   SPECREQUEST NONE 09/04/2013 1435   CULT  Value: NO GROWTH 3 DAYS Performed at [facility] 09/04/2013 1435   REPTSTATUS 09/07/2013 FINAL 09/04/2013 1435    Radiological Exams on Admission: Dg Chest 2 View  11/03/2013   CLINICAL DATA:  Status post fall  EXAM: CHEST  2 VIEW  COMPARISON:  10/15/2013  FINDINGS: There is no focal parenchymal opacity, pleural effusion, or pneumothorax. The heart and mediastinal contours are unremarkable.  There is a right shoulder arthroplasty. 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  IMPRESSION: No active cardiopulmonary disease.   Electronically Signed   By: Kathreen Devoid   On: 11/03/2013 20:18   Dg Hip Complete Right  11/03/2013   CLINICAL DATA:  Right hip pain.  Fall 1 week ago.   EXAM: RIGHT HIP - COMPLETE 2+ VIEW  COMPARISON:  09/03/2013  FINDINGS: Left femur IM nail and helical hip screw noted. This traverses the prior intertrochanteric fracture.  Acute fracture, right pubic body and adjacent portions of the superior and inferior pubic rami. Possible fracture of the right sacrum. Bony demineralization.  In addition, there is bony discontinuity in the right lateral trochanter suspicious for at least a trochanteric avulsion fracture.  IMPRESSION: 1. Acute fractures of the right pubic rami and adjacent pubic body. 2. Probable fracture of the right greater trochanter, possibly avulsion, conceivably a subtle intertrochanteric fracture. 3. Possible right sacral fracture. 4. Old left hip fracture traversed by a helical hip screw.   Electronically Signed   By: Sherryl Barters M.D.   On: 11/03/2013 20:24   Ct Head Wo Contrast  11/03/2013   CLINICAL DATA:  Status post fall  EXAM: CT HEAD WITHOUT CONTRAST  TECHNIQUE: Contiguous axial images were obtained from the base of the skull through the vertex without intravenous contrast.  COMPARISON:  10/15/2013  FINDINGS: There is no  evidence of mass effect, midline shift, or extra-axial fluid collections. There is no evidence of a space-occupying lesion or intracranial hemorrhage. There is no evidence of a cortical-based area of acute infarction. There is generalized cerebral atrophy. There is periventricular white matter low attenuation likely secondary to microangiopathy.  The ventricles and sulci are appropriate for the patient's age. The basal cisterns are patent.  Visualized portions of the orbits are unremarkable. The visualized portions of the paranasal sinuses and mastoid air cells are unremarkable. Cerebrovascular atherosclerotic calcifications are noted.  The osseous structures are unremarkable.  IMPRESSION: No acute intracranial pathology.   Electronically Signed   By: Kathreen Devoid   On: 11/03/2013 19:43   Ct Pelvis Wo Contrast  11/03/2013    CLINICAL DATA:  Fall with hip pain  EXAM: CT PELVIS WITHOUT CONTRAST  TECHNIQUE: Multidetector CT imaging of the pelvis was performed following the standard protocol without intravenous contrast.  COMPARISON:  Pelvis radiograph from the same day  FINDINGS: Right superior and inferior pubic ramus fractures with mild bony impaction. The fractures continue into the right pubic body. There is a nondisplaced fracture through the right sacral ala, not affecting the sacral foramina.  There is a comminuted, nondisplaced fracture involving the right greater trochanter. No visible continuation into the intertrochanteric region or femoral neck.  Remote intertrochanteric left femur fracture status post ORIF. There is no evidence of acute hardware complication. Chronic loose body in the anterior left hip joint measuring 16 mm.  Spiculated sclerotic focus in the posterior left ilium is stable from prior, morphology favoring bone island.  Neighboring the superior pubic ramus fracture is a extraperitoneal hematoma which mildly deforms the right bladder. There is no visible bladder wall thickening to suggests injury. Ascites in the lower abdomen in this patient with known cirrhosis. Small volume pneumaturia, usually from manipulation.  IMPRESSION: 1. Mildly impacted right obturator ring fractures. 2. Nondisplaced right sacral ala fracture. No sacral foramina involvement. 3. Nondisplaced greater trochanter fracture of the right femur. No visible continuation into the intertrochanteric or femoral neck regions. 4. Chronic findings are noted above.   Electronically Signed   By: Jorje Guild M.D.   On: 11/03/2013 22:58    Chart has been reviewed  Assessment/Plan  80 yo M with hx of CHF, cirrhosis, a.fib now in SR admitted after a fall while in SNF resulting with pelvic and femur non-operative fractures. Noted to have worsening anemia with hem occult positive stools and UTI  Present on Admission:  Pelvic and hip fractures  -pain control and rehab placement . Normocytic anemia - monitor Hg q 8h, INR was checked and is  1.53, GI consult in AM, transfuse as needed . Hypertension - continue metoprolol patient is currently in sinus . CHF (congestive heart failure) - give gentle IV as patient appears fluid down . UTI (urinary tract infection) - treat with rocephin await results of urine culture   Prophylaxis: SCD, Protonix  CODE STATUS:   DNR/DNI  Other plan as per orders.  I have spent a total of 55 min on this admission  Raymond Larsen 11/04/2013, 12:44 AM  [facility]  Pager [phone]   If 7AM-7PM, please contact the day team taking care of the patient  [email or web address]  Password [email or web address]
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
## 2013-11-04 NOTE — Progress Notes (Addendum)
Patient ID: Detravion Tester Sr., male   DOB: 11-17-1922, 80 y.o.   MRN: [id number]         PATIENT DETAILS Name: Jasim Harari Sr. Age: 80 y.o. Sex: male Date of Birth: [birth year] Admit Date: 11/03/2013 Admitting Physician Toy Baker, MD BPZ:WCHENID,POEUMP H, MD  Subjective: Mild dizziness, otherwise has no complaints.  Assessment/Plan: Active Problems:  Pelvic and hip fractures: -Spoke with Dr Percell Miller, he reviewed the Xray/CT scans, recommended supportive care, no surgery need. Continue with  pain control, PT eval.  Worsening anemia -Has hx of chronic anemia, with baseline Hb around 9-10, slight drop in Hb this admit, no overt evidence of active GI bleeding-although hemoccult is positive ongoing GI bleed. Suspect slight drop in Hb could be from fractures.Continue to monitor Hb, if drops significantly, then would consult GI.  Urinary Tract Infection -afebrile, no leukocytosis, mentation improved -c/w rocephin await results of urine culture  Acute Encephalopathy -likely secondary UTI, NH4 levels normal on admission -improved, suspect at baseline -c/w Rocephin   Hx of Liver Cirrhoses -stable -restarting Lasix,c/w Lactulose  Hx of Atrial Fibrilaltion -rate controlled with Metoprolol -not a candidate for anticoagulation given falls, and prior bleeding episodes  Hx of Hypothyroidism -c/w Levothyroxine   Hypertension  -BP contr0lled- continue metoprolol   Chronic Diastolic Heart Failure:  -given gentle IV on admission, now appears compensated, will stop IVF, and restart Lasix. Continue Beta blockers  Disposition: Consider discharge to rehab facility.   DVT Prophylaxis:  SCD's  Code Status: DNR  Family Communication No family members present at this time. Patient states he has four living daughters.   Procedures:  None at this time.   CONSULTS:  orthopedic surgery  Time spent 40 minutes-which includes 50% of the time with face-to-face with patient/ family and  coordinating care related to the above assessment and plan.  MEDICATIONS: Scheduled Meds: . aspirin EC  81 mg Oral QHS  . cefTRIAXone (ROCEPHIN) IVPB 1 gram/50 mL D5W  1 g Intravenous Q24H  . docusate sodium  100 mg Oral BID  . finasteride  5 mg Oral QHS  . furosemide  40 mg Oral Daily  . lactulose  30 g Oral Daily  . levothyroxine  100 mcg Oral QAC breakfast  . metoprolol tartrate  12.5 mg Oral BID  . mirtazapine  30 mg Oral QHS  . pantoprazole  40 mg Oral Daily  . senna  1 tablet Oral BID   Continuous Infusions:  PRN Meds:.acetaminophen, acetaminophen, bisacodyl, guaiFENesin-dextromethorphan, HYDROcodone-acetaminophen, levalbuterol, methocarbamol (ROBAXIN) IV, methocarbamol, morphine injection, nitroGLYCERIN, ondansetron (ZOFRAN) IV, ondansetron, polyethylene glycol, sodium phosphate  Antibiotics: Anti-infectives   Start     Dose/Rate Route Frequency Ordered Stop   11/04/13 2300  cefTRIAXone (ROCEPHIN) 1 g in dextrose 5 % 50 mL IVPB     1 g 100 mL/hr over 30 Minutes Intravenous Every 24 hours 11/04/13 0137     11/04/13 0015  cefTRIAXone (ROCEPHIN) 1 g in dextrose 5 % 50 mL IVPB     1 g 100 mL/hr over 30 Minutes Intravenous  Once 11/04/13 0004 11/04/13 0058       PHYSICAL EXAM: Vital signs in last 24 hours: Filed Vitals:   11/04/13 0000 11/04/13 0100 11/04/13 0150 11/04/13 0455  BP: 118/69 110/58 107/53 115/57  Pulse: 92 92 88 95  Temp:   98 F (36.7 C) 98.5 F (36.9 C)  TempSrc:   Oral Oral  Resp: 15 14 17 14   Height:      Weight:  SpO2: 97% 97% 94% 94%    Weight change:  Filed Weights   11/03/13 1824  Weight: 68.04 kg (150 lb)   Body mass index is 24.22 kg/(m^2).   Gen Exam: Awake and alert with clear speech, in no apparent distress.  Neck: Supple, No JVD.   Chest: B/L Clear CVS: S1 S2 Regular, no murmurs.  Abdomen: soft, BS +, non tender, non distended. Extremities: 1+ pitting edema in lower extremities bilaterally, lower extremities warm to  touch. Neurologic: Non Focal.   Skin: No Rash.  Wounds: N/A.   Intake/Output from previous day:  Intake/Output Summary (Last 24 hours) at 11/04/13 0952 Last data filed at 11/04/13 0645  Gross per 24 hour  Intake    250 ml  Output      0 ml  Net    250 ml     LAB RESULTS: CBC  Recent Labs Lab 11/03/13 1853 11/04/13 0414  WBC 8.7 7.9  HGB 8.7* 8.7*  HCT 25.7* 25.5*  PLT 155 147*  MCV 92.4 92.7  MCH 31.3 31.6  MCHC 33.9 34.1  RDW 15.1 15.3    Chemistries   Recent Labs Lab 11/03/13 1853 11/04/13 0414  NA 139 134*  K 4.1 3.9  CL 105 100  CO2 26 24  GLUCOSE 104* 121*  BUN 25* 24*  CREATININE 1.12 1.09  CALCIUM 7.8* 7.7*  MG  --  2.2    CBG: No results found for this basename: GLUCAP,  in the last 168 hours  GFR Estimated Creatinine Clearance: 42.3 ml/min (by C-G formula based on Cr of 1.09).  Coagulation profile  Recent Labs Lab 11/04/13 0114  INR 1.53*    Cardiac Enzymes No results found for this basename: CK, CKMB, TROPONINI, MYOGLOBIN,  in the last 168 hours  No components found with this basename: POCBNP,  No results found for this basename: DDIMER,  in the last 72 hours No results found for this basename: HGBA1C,  in the last 72 hours No results found for this basename: CHOL, HDL, LDLCALC, TRIG, CHOLHDL, LDLDIRECT,  in the last 72 hours  Recent Labs  11/04/13 0414  TSH 5.220*   No results found for this basename: VITAMINB12, FOLATE, FERRITIN, TIBC, IRON, RETICCTPCT,  in the last 72 hours No results found for this basename: LIPASE, AMYLASE,  in the last 72 hours  Urine Studies No results found for this basename: UACOL, UAPR, USPG, UPH, UTP, UGL, UKET, UBIL, UHGB, UNIT, UROB, ULEU, UEPI, UWBC, URBC, UBAC, CAST, CRYS, UCOM, BILUA,  in the last 72 hours  MICROBIOLOGY: No results found for this or any previous visit (from the past 240 hour(s)).  RADIOLOGY STUDIES/RESULTS: Dg Chest 2 View  11/03/2013   CLINICAL DATA:  Status post fall   EXAM: CHEST  2 VIEW  COMPARISON:  10/15/2013  FINDINGS: There is no focal parenchymal opacity, pleural effusion, or pneumothorax. The heart and mediastinal contours are unremarkable.  There is a right shoulder arthroplasty. 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  IMPRESSION: No active cardiopulmonary disease.   Electronically Signed   By: Kathreen Devoid   On: 11/03/2013 20:18   Dg Hip Complete Right  11/03/2013   CLINICAL DATA:  Right hip pain.  Fall 1 week ago.  EXAM: RIGHT HIP - COMPLETE 2+ VIEW  COMPARISON:  09/03/2013  FINDINGS: Left femur IM nail and helical hip screw noted. This traverses the prior intertrochanteric fracture.  Acute fracture, right pubic body and adjacent portions of the superior and inferior  pubic rami. Possible fracture of the right sacrum. Bony demineralization.  In addition, there is bony discontinuity in the right lateral trochanter suspicious for at least a trochanteric avulsion fracture.  IMPRESSION: 1. Acute fractures of the right pubic rami and adjacent pubic body. 2. Probable fracture of the right greater trochanter, possibly avulsion, conceivably a subtle intertrochanteric fracture. 3. Possible right sacral fracture. 4. Old left hip fracture traversed by a helical hip screw.   Electronically Signed   By: Sherryl Barters M.D.   On: 11/03/2013 20:24   Ct Head Wo Contrast  11/03/2013   CLINICAL DATA:  Status post fall  EXAM: CT HEAD WITHOUT CONTRAST  TECHNIQUE: Contiguous axial images were obtained from the base of the skull through the vertex without intravenous contrast.  COMPARISON:  10/15/2013  FINDINGS: There is no evidence of mass effect, midline shift, or extra-axial fluid collections. There is no evidence of a space-occupying lesion or intracranial hemorrhage. There is no evidence of a cortical-based area of acute infarction. There is generalized cerebral atrophy. There is periventricular white matter low attenuation likely secondary to microangiopathy.  The  ventricles and sulci are appropriate for the patient's age. The basal cisterns are patent.  Visualized portions of the orbits are unremarkable. The visualized portions of the paranasal sinuses and mastoid air cells are unremarkable. Cerebrovascular atherosclerotic calcifications are noted.  The osseous structures are unremarkable.  IMPRESSION: No acute intracranial pathology.   Electronically Signed   By: Kathreen Devoid   On: 11/03/2013 19:43   Ct Head Wo Contrast  10/15/2013   CLINICAL DATA:  syncope syncope  EXAM: CT HEAD WITHOUT CONTRAST  TECHNIQUE: Contiguous axial images were obtained from the base of the skull through the vertex without intravenous contrast.  COMPARISON:  Head CT dated 01/08/2014  FINDINGS: No acute intracranial abnormality. Specifically, no hemorrhage, hydrocephalus, mass lesion, acute infarction, or significant intracranial injury. The bony calvarium appears intact. Diffuse areas of low attenuation within the subcortical, deep, and periventricular white matter regions consistent small vessel at matter ischemia. Age appropriate global atrophy. Visualized paranasal sinuses and mastoid air cells are patent.  IMPRESSION: Chronic involutional changes.  No acute intracranial abnormality.   Electronically Signed   By: Margaree Mackintosh M.D.   On: 10/15/2013 12:09   Ct Pelvis Wo Contrast  11/03/2013   CLINICAL DATA:  Fall with hip pain  EXAM: CT PELVIS WITHOUT CONTRAST  TECHNIQUE: Multidetector CT imaging of the pelvis was performed following the standard protocol without intravenous contrast.  COMPARISON:  Pelvis radiograph from the same day  FINDINGS: Right superior and inferior pubic ramus fractures with mild bony impaction. The fractures continue into the right pubic body. There is a nondisplaced fracture through the right sacral ala, not affecting the sacral foramina.  There is a comminuted, nondisplaced fracture involving the right greater trochanter. No visible continuation into the  intertrochanteric region or femoral neck.  Remote intertrochanteric left femur fracture status post ORIF. There is no evidence of acute hardware complication. Chronic loose body in the anterior left hip joint measuring 16 mm.  Spiculated sclerotic focus in the posterior left ilium is stable from prior, morphology favoring bone island.  Neighboring the superior pubic ramus fracture is a extraperitoneal hematoma which mildly deforms the right bladder. There is no visible bladder wall thickening to suggests injury. Ascites in the lower abdomen in this patient with known cirrhosis. Small volume pneumaturia, usually from manipulation.  IMPRESSION: 1. Mildly impacted right obturator ring fractures. 2. Nondisplaced right sacral ala  fracture. No sacral foramina involvement. 3. Nondisplaced greater trochanter fracture of the right femur. No visible continuation into the intertrochanteric or femoral neck regions. 4. Chronic findings are noted above.   Electronically Signed   By: Jorje Guild M.D.   On: 11/03/2013 22:58   Dg Chest Port 1 View  10/15/2013   CLINICAL DATA:  Edema, history GERD, hypertension, atrial fibrillation, alcoholic cirrhosis with esophageal varices  EXAM: PORTABLE CHEST - 1 VIEW  COMPARISON:  Portable exam 1021 hr compared to 09/03/2013  FINDINGS: Enlargement of cardiac silhouette with pulmonary vascular congestion.  Perihilar and basilar infiltrates likely pulmonary edema.  No gross pleural effusion or pneumothorax.  Bones demineralized.  Prior RIGHT shoulder replacement with LEFT glenohumeral degenerative changes.  Atherosclerotic calcification aorta.  IMPRESSION: BILATERAL pulmonary infiltrates favor pulmonary edema over infection.  Enlargement of cardiac silhouette with pulmonary vascular congestion.   Electronically Signed   By: Lavonia Dana M.D.   On: 10/15/2013 11:12    Audelia Acton, PA-S [facility] Pager:[phone]  If 7PM-7AM, please contact  night-coverage [email or web address] Password [email or web address] 11/04/2013, 9:52 AM   LOS: 1 day   **Disclaimer: This note may have been dictated with voice recognition software. Similar sounding words can inadvertently be transcribed and this note may contain transcription errors which may not have been corrected upon publication of note.**  Attending Patient was seen, examined,treatment plan was discussed with the Physician extender. I have directly reviewed the clinical findings, lab, imaging studies and management of this patient in detail. I have made the necessary changes to the above noted documentation, and agree with the documentation, as recorded by the Physician extender.  Nena Alexander MD [facility].

## 2013-11-04 NOTE — Progress Notes (Signed)
Utilization review completed.  

## 2013-11-04 NOTE — Progress Notes (Signed)
Clinical Social Work Department CLINICAL SOCIAL WORK PLACEMENT NOTE 11/04/2013  Patient:  ABDELRAHMAN, NAIR  Account Number:  [id number] Admit date:  11/03/2013  Clinical Social Worker:  Hunt Oris, Latanya Presser  Date/time:  11/04/2013 12:20 PM  Clinical Social Work is seeking post-discharge placement for this patient at the following level of care:   SKILLED NURSING   (*CSW will update this form in Epic as items are completed)   11/04/2013  Patient/family provided with [facility] Department of Clinical Social Work's list of facilities offering this level of care within the geographic area requested by the patient (or if unable, by the patient's family).  11/04/2013  Patient/family informed of their freedom to choose among providers that offer the needed level of care, that participate in Medicare, Medicaid or managed care program needed by the patient, have an available bed and are willing to accept the patient.  11/04/2013  Patient/family informed of [facility]' ownership interest in [facility], as well as of the fact that they are under no obligation to receive care at this facility.  PASARR submitted to EDS on  PASARR number received on   FL2 transmitted to all facilities in geographic area requested by pt/family on  11/04/13 FL2 transmitted to all facilities within larger geographic area on   Patient informed that his/her managed care company has contracts with or will negotiate with  certain facilities, including the following:     Patient/family informed of bed offers received:   Patient chooses bed at  Physician recommends and patient chooses bed at    Patient to be transferred to  on   Patient to be transferred to facility by  Patient and family notified of transfer on  Name of family member notified:    The following physician request were entered in Epic:   Additional Comments:

## 2013-11-04 NOTE — Progress Notes (Signed)
Occupational Therapy Evaluation Patient Details Name: Raymond Coppolino Sr. MRN: [id number] DOB: [birth year] Today's Date: 11/04/2013    History of Present Illness 80 y.o. male admitted after a fall resulting in a greater trochanter fracture of the right femur,  right sacral ala fracture, and right obturator ring fractures. [name] has elected to treat non-operatively at this time.  Hx of HTN, a-fib, GERD, bilateral hearing loss, and CHF.   Clinical Impression   Patient presents needing extensive A with ADLs and functional mobility. Will benefit from skilled OT to increased independence and safety with ADLs and facilitate safe discharge.    Follow Up Recommendations  SNF    Equipment Recommendations  Other (comment) (tbd next venue of care)    Recommendations for Other Services PT consult     Precautions / Restrictions Precautions Precautions: Fall Restrictions Weight Bearing Restrictions: Yes RLE Weight Bearing: Touchdown weight bearing      Mobility Bed Mobility Overal bed mobility: Needs Assistance;+2 for physical assistance Bed Mobility: Supine to Sit     Supine to sit: Mod assist;+2 for physical assistance;HOB elevated     General bed mobility comments: Mod assist +2 for RLE support and trunk support. Used bed pad to assist with scoot. Uses bed rail minimally.  Transfers Overall transfer level: Needs assistance Equipment used: Rolling walker (2 wheeled) Transfers: Sit to/from [name] Sit to Stand: Max assist;+2 physical assistance   Squat pivot transfers: Total assist;+2 physical assistance     General transfer comment: Attempted to stand from elevated surfaces with +2 and max assist to boost. Unable to safely perform. Requires assistanc to keep RLE at TDWB status. Pt apprehensive and unable to stand upright. Total assist transfer +2 to chair. VC to maintain TDWB on RLE.    Balance Overall balance assessment: Needs assistance;History of  Falls Sitting-balance support: No upper extremity supported;Feet supported Sitting balance-Leahy Scale: Fair     Standing balance support: Bilateral upper extremity supported Standing balance-Leahy Scale: Zero                              ADL Overall ADL's : Needs assistance/impaired Eating/Feeding: Minimal assistance;Sitting;Moderate assistance (diffiulty due to no active movement shoulders due to weaknes)   Grooming: Wash/dry hands;Wash/dry face;Minimal assistance   Upper Body Bathing: Total assistance   Lower Body Bathing: Total assistance   Upper Body Dressing : Total assistance   Lower Body Dressing: Total assistance               Functional mobility during ADLs: +2 for physical assistance General ADL Comments: Patient reports fatigue due to recently worked with PT. Stated he wanted to take a nap. Shoulder weakness is major barrier to ADL independence.     Vision                     Perception     Praxis      Pertinent Vitals/Pain No c/o pain     Hand Dominance Right   Extremity/Trunk Assessment Upper Extremity Assessment Upper Extremity Assessment: RUE deficits/detail;LUE deficits/detail RUE Deficits / Details: PROM shoulder to 90 degrees, AAROM elbow WFL, grip WFL LUE Deficits / Details: PROM shoulder to 90 degrees, AAROM elbow WFL, grip WFL   Lower Extremity Assessment Lower Extremity Assessment: Defer to PT evaluation RLE Deficits / Details: decreased strength and ROM - limited assessment due to pain and WB restrictions. RLE: Unable to fully assess due to pain;Unable to  fully assess due to immobilization       Communication Communication Communication: HOH   Cognition Arousal/Alertness: Awake/alert Behavior During Therapy: WFL for tasks assessed/performed Overall Cognitive Status: No family/caregiver present to determine baseline cognitive functioning       Memory: Decreased recall of precautions;Decreased short-term  memory             General Comments       Exercises Exercises: General Lower Extremity     Shoulder Instructions      Home Living Family/patient expects to be discharged to:: Skilled nursing facility   Available Help at Discharge: [place]: [name];Walker - 4 wheels;Cane - single point;Bedside commode;Shower seat;Electric scooter;Wheelchair - manual          Prior Functioning/Environment Level of Independence: Needs assistance  Gait / Transfers Assistance Needed: Reports he could ambulate with a walker, but sometimes needed help.  ADL's / Homemaking Assistance Needed: Reports someone would help him bathe, and dress.        OT Diagnosis: Generalized weakness;Acute pain   OT Problem List: Decreased strength;Decreased range of motion;Decreased activity tolerance;Impaired balance (sitting and/or standing);Decreased safety awareness;Decreased knowledge of use of DME or AE;Decreased knowledge of precautions;Impaired UE functional use   OT Treatment/Interventions: Self-care/ADL training;DME and/or AE instruction;Therapeutic activities;Patient/family education;Therapeutic exercise    OT Goals(Current goals can be found in the care plan section) Acute Rehab OT Goals Patient Stated Goal: None stated OT Goal Formulation: With patient Time For Goal Achievement: 11/18/13 Potential to Achieve Goals: Good  OT Frequency: Min 2X/week   Barriers to D/C:            Co-evaluation              End of Session    Activity Tolerance: Patient limited by fatigue Patient left: in chair;with call bell/phone within reach   Time: 1432-1447 OT Time Calculation (min): 15 min Charges:  OT General Charges $OT Visit: 1 Procedure OT Evaluation $Initial OT Evaluation Tier I: 1 Procedure OT Treatments $Self Care/Home Management : 8-22 mins G-Codes:    Jennafer Gladue A 2013-11-26, 3:19 PM

## 2013-11-04 NOTE — Consult Note (Addendum)
ORTHOPAEDIC CONSULTATION  REQUESTING PHYSICIAN: Jonetta Osgood, MD  Chief Complaint: Right GT and Sup/inf rami fx's  HPI: Raymond Ferns Sr. is a 80 y.o. male who complains of  R hip/groin/back pain after a mechanical fall. He reports that he slipped and fell but is unable to recall specific details of the fall.   Past Medical History  Diagnosis Date  . Arthritis     Shoulders and knees  . Hyperlipidemia   . Hypertension   . A-fib     Dr Wynonia Lawman; WAS on Coumadin until 04/2012  . Angina     uses NTG prn  . Pneumonia     12 /2012.  HC assoc pneumonia/aspiration pneumonia 2015  . Hypothyroidism     x 10 yrs  . GERD (gastroesophageal reflux disease)   . Constipation, chronic   . Depression   . History of dizziness   . Anxiety   . Urinary frequency   . Hearing loss of both ears     wears hearing aides  . BPH with obstruction/lower urinary tract symptoms     sees Dr Gaynelle Arabian  . Compression fracture of thoracic vertebra, non-traumatic 06/14/12    T6 and T7 (noted on CT angio chest to r/o PE)  . Alcoholic cirrhosis of liver with ascites 06/14/12    Noted on CT angio chest to r/o PE  . Esophageal varices in alcoholic cirrhosis "   "    "   "    "  . Intertrochanteric fracture of left hip 05/24/2013  . HCAP (healthcare-associated pneumonia) 07/13/2013  . Postoperative anemia due to acute blood loss 05/27/2013  . CHF (congestive heart failure) 10/15/2013  . Prostate cancer 1992   Past Surgical History  Procedure Laterality Date  . Total knee arthroplasty Right 1994  . Total shoulder replacement Right 1997  . Cholecystectomy  2004  . Inguinal hernia repair  05/09/2011    Procedure: HERNIA REPAIR INGUINAL ADULT;  Surgeon: Odis Hollingshead, MD;  Location: [facility];  Service: General;  Laterality: Right;  . Femur im nail Left 05/25/2013    Procedure: INTRAMEDULLARY (IM) NAIL FEMORAL;  Surgeon: Johnny Bridge, MD;  Location: [facility];  Service: Orthopedics;  Laterality: Left;  .  Femoral hernia repair Right 07/13/2013    Procedure: REPAIR RIGHT INCARCERATED FEMORAL HERNIA WITH MESH;  Surgeon: Stark Klein, MD;  Location: [facility];  Service: General;  Laterality: Right;  . Laparotomy N/A 07/13/2013    Procedure: EXPLORATORY LAPAROTOMY;  Surgeon: Stark Klein, MD;  Location: [facility];  Service: General;  Laterality: N/A;  . Bowel resection N/A 07/13/2013    Procedure: SMALL BOWEL RESECTION;  Surgeon: Stark Klein, MD;  Location: [facility];  Service: General;  Laterality: N/A;  . Joint replacement    . Inguinal hernia repair Left 1990  . Inguinal hernia repair Right 04/2011  . Cataract extraction w/ intraocular lens  implant, bilateral Bilateral 2013  . Transurethral resection of prostate  1992   History   Social History  . Marital Status: Married    Spouse Name: N/A    Number of Children: N/A  . Years of Education: N/A   Social History Main Topics  . Smoking status: Former Smoker -- 0.50 packs/day for 35 years    Types: Cigarettes    Quit date: 05/02/1984  . Smokeless tobacco: Current User    Types: Chew  . Alcohol Use: No  . Drug Use: No  . Sexual Activity: Not Currently  Other Topics Concern  . 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   Wife with dementia.   Worked all his life in [employer], did carpentry work on the side.  Served in the [employer] ([employer] [place]   Hx of alcohol abuse up until his 5s.   Says he smoked "years ago" but "I never did inhale".   No chewing tobacco or dip.                        Family History  Problem Relation Age of Onset  . Alcoholism Father   . Cirrhosis Father    Allergies  Allergen Reactions  . Amitriptyline Other (See Comments)    Dizziness   . Beta Adrenergic Blockers Other (See Comments)    lethargy  . Imdur [Isosorbide]     Headache   . Niacin And Related Rash       . Sulfa Antibiotics Itching, Nausea And Vomiting and Rash   Prior to Admission medications   Medication Sig Start  Date End Date Taking? Authorizing Provider  aspirin EC 81 MG tablet Take 81 mg by mouth at bedtime.   Yes Historical Provider, MD  finasteride (PROSCAR) 5 MG tablet Take 5 mg by mouth at bedtime.  05/01/12  Yes Historical Provider, MD  furosemide (LASIX) 40 MG tablet Take 1 tablet (40 mg total) by mouth daily. 10/17/13  Yes Reyne Dumas, MD  guaiFENesin-dextromethorphan (ROBITUSSIN DM) 100-10 MG/5ML syrup Take 5 mLs by mouth every 4 (four) hours as needed for cough. 10/17/13  Yes Reyne Dumas, MD  HYDROcodone-acetaminophen (NORCO/VICODIN) 5-325 MG per tablet Take 1 tablet by mouth every 4 (four) hours as needed (pain).   Yes Historical Provider, MD  lactulose (CHRONULAC) 10 GM/15ML solution Take 30 g by mouth daily.   Yes Historical Provider, MD  levalbuterol Penne Lash) 0.63 MG/3ML nebulizer solution Take 3 mLs (0.63 mg total) by nebulization every 6 (six) hours as needed for wheezing or shortness of breath. 10/17/13  Yes Reyne Dumas, MD  levofloxacin (LEVAQUIN) 750 MG tablet Take 750 mg by mouth daily.   Yes Historical Provider, MD  levothyroxine (SYNTHROID, LEVOTHROID) 100 MCG tablet Take 100 mcg by mouth daily before breakfast.   Yes Historical Provider, MD  metoprolol tartrate (LOPRESSOR) 25 MG tablet Take 0.5 tablets (12.5 mg total) by mouth 2 (two) times daily. 07/21/13  Yes Shanker Kristeen Mans, MD  mirtazapine (REMERON) 30 MG tablet Take 30 mg by mouth at bedtime.  01/04/11  Yes Historical Provider, MD  nitroGLYCERIN (NITROSTAT) 0.4 MG SL tablet Place 0.4 mg under the tongue every 5 (five) minutes as needed for chest pain. Give 1 tablet every 5 minutes for chest pain x 3 doses.  If unrelieved after 3 doses, call MD   Yes Historical Provider, MD  pantoprazole (PROTONIX) 40 MG tablet Take 40 mg by mouth daily.  01/24/11  Yes Historical Provider, MD  vitamin B-12 (CYANOCOBALAMIN) 1000 MCG tablet Take 1,000 mcg by mouth daily.   Yes Historical Provider, MD   Dg Chest 2 View  11/03/2013   CLINICAL DATA:   Status post fall  EXAM: CHEST  2 VIEW  COMPARISON:  10/15/2013  FINDINGS: There is no focal parenchymal opacity, pleural effusion, or pneumothorax. The heart and mediastinal contours are unremarkable.  There is a right shoulder arthroplasty. 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  IMPRESSION: No active cardiopulmonary disease.   Electronically Signed   By: Elbert Ewings  Patel   On: 11/03/2013 20:18   Dg Hip Complete Right  11/03/2013   CLINICAL DATA:  Right hip pain.  Fall 1 week ago.  EXAM: RIGHT HIP - COMPLETE 2+ VIEW  COMPARISON:  09/03/2013  FINDINGS: Left femur IM nail and helical hip screw noted. This traverses the prior intertrochanteric fracture.  Acute fracture, right pubic body and adjacent portions of the superior and inferior pubic rami. Possible fracture of the right sacrum. Bony demineralization.  In addition, there is bony discontinuity in the right lateral trochanter suspicious for at least a trochanteric avulsion fracture.  IMPRESSION: 1. Acute fractures of the right pubic rami and adjacent pubic body. 2. Probable fracture of the right greater trochanter, possibly avulsion, conceivably a subtle intertrochanteric fracture. 3. Possible right sacral fracture. 4. Old left hip fracture traversed by a helical hip screw.   Electronically Signed   By: Sherryl Barters M.D.   On: 11/03/2013 20:24   Ct Head Wo Contrast  11/03/2013   CLINICAL DATA:  Status post fall  EXAM: CT HEAD WITHOUT CONTRAST  TECHNIQUE: Contiguous axial images were obtained from the base of the skull through the vertex without intravenous contrast.  COMPARISON:  10/15/2013  FINDINGS: There is no evidence of mass effect, midline shift, or extra-axial fluid collections. There is no evidence of a space-occupying lesion or intracranial hemorrhage. There is no evidence of a cortical-based area of acute infarction. There is generalized cerebral atrophy. There is periventricular white matter low attenuation likely secondary to  microangiopathy.  The ventricles and sulci are appropriate for the patient's age. The basal cisterns are patent.  Visualized portions of the orbits are unremarkable. The visualized portions of the paranasal sinuses and mastoid air cells are unremarkable. Cerebrovascular atherosclerotic calcifications are noted.  The osseous structures are unremarkable.  IMPRESSION: No acute intracranial pathology.   Electronically Signed   By: Kathreen Devoid   On: 11/03/2013 19:43   Ct Pelvis Wo Contrast  11/03/2013   CLINICAL DATA:  Fall with hip pain  EXAM: CT PELVIS WITHOUT CONTRAST  TECHNIQUE: Multidetector CT imaging of the pelvis was performed following the standard protocol without intravenous contrast.  COMPARISON:  Pelvis radiograph from the same day  FINDINGS: Right superior and inferior pubic ramus fractures with mild bony impaction. The fractures continue into the right pubic body. There is a nondisplaced fracture through the right sacral ala, not affecting the sacral foramina.  There is a comminuted, nondisplaced fracture involving the right greater trochanter. No visible continuation into the intertrochanteric region or femoral neck.  Remote intertrochanteric left femur fracture status post ORIF. There is no evidence of acute hardware complication. Chronic loose body in the anterior left hip joint measuring 16 mm.  Spiculated sclerotic focus in the posterior left ilium is stable from prior, morphology favoring bone island.  Neighboring the superior pubic ramus fracture is a extraperitoneal hematoma which mildly deforms the right bladder. There is no visible bladder wall thickening to suggests injury. Ascites in the lower abdomen in this patient with known cirrhosis. Small volume pneumaturia, usually from manipulation.  IMPRESSION: 1. Mildly impacted right obturator ring fractures. 2. Nondisplaced right sacral ala fracture. No sacral foramina involvement. 3. Nondisplaced greater trochanter fracture of the right femur.  No visible continuation into the intertrochanteric or femoral neck regions. 4. Chronic findings are noted above.   Electronically Signed   By: Jorje Guild M.D.   On: 11/03/2013 22:58    Positive ROS: All other systems have been reviewed and were  otherwise negative with the exception of those mentioned in the HPI and as above.  Labs cbc  Recent Labs  11/03/13 1853 11/04/13 0414  WBC 8.7 7.9  HGB 8.7* 8.7*  HCT 25.7* 25.5*  PLT 155 147*    Labs inflam No results found for this basename: ESR, CRP,  in the last 72 hours  Labs coag  Recent Labs  11/04/13 0114  INR 1.53*     Recent Labs  11/03/13 1853 11/04/13 0414  NA 139 134*  K 4.1 3.9  CL 105 100  CO2 26 24  GLUCOSE 104* 121*  BUN 25* 24*  CREATININE 1.12 1.09  CALCIUM 7.8* 7.7*    Physical Exam: Filed Vitals:   11/04/13 0455  BP: 115/57  Pulse: 95  Temp: 98.5 F (36.9 C)  Resp: 14   General: Alert, no acute distress Cardiovascular: No pedal edema Respiratory: No cyanosis, no use of accessory musculature GI: No organomegaly, abdomen is soft and non-tender Skin: No lesions in the area of chief complaint Neurologic: Sensation intact distally Psychiatric: Patient is competent for consent with normal mood and affect Lymphatic: No axillary or cervical lymphadenopathy  MUSCULOSKELETAL:  RLE: pain with log roll,  SILT DP/SP/S/S/T nerve, 2+ DP, +TA/GS/EHL Compartments soft  Other extremities are atraumatic with painless ROM and NVI.  Assessment: Right GT avulsion min displaced fx. R LC pelvic injury with sup/inf rami and min displaced sacrum  Plan: Will attempt non-operative treatment Weight Bearing Status: TDWB RLE     -in light of the avulsion fracture and his functional level, I think TDWB protects the GT and is safer to prevent more falls, I will watch his LC pelvic injury and adjust if it starts to move.  PT VTE px: SCD's and chemical per the primary team, no orthopedic contraindication.  He  is likely to need SNF vs AIR    He should f/u with me in 2wks for a repeat xray, please call with any further questions   Edmonia Lynch, D, MD Cell [phone]   11/04/2013 11:24 AM

## 2013-11-04 NOTE — Evaluation (Signed)
Physical Therapy Evaluation Patient Details Name: Raymond Tosh Sr. MRN: [id number] DOB: Aug 17, 1922 Today's Date: 11/04/2013   History of Present Illness  80 y.o. male admitted after a fall resulting in a greater trochanter fracture of the right femur,  right sacral ala fracture, and right obturator ring fractures. Raymond Larsen has elected to treat non-operatively at this time.  Hx of HTN, a-fib, GERD, bilateral hearing loss, and CHF.  Clinical Impression  Pt admitted with the above. Pt currently with functional limitations due to the deficits listed below (see PT Problem List). Mod-total assist with mobility. Unable to maintain TDWB status on RLE when attempting to stand. Oriented to person and time only. Pt will benefit from skilled PT to increase their independence and safety with mobility to allow discharge to the venue listed below.       Follow Up Recommendations SNF;Supervision/Assistance - 24 hour    Equipment Recommendations  None recommended by PT    Recommendations for Other Services       Precautions / Restrictions Precautions Precautions: Fall Restrictions Weight Bearing Restrictions: Yes RLE Weight Bearing: Touchdown weight bearing      Mobility  Bed Mobility Overal bed mobility: Needs Assistance;+2 for physical assistance Bed Mobility: Supine to Sit     Supine to sit: Mod assist;+2 for physical assistance;HOB elevated     General bed mobility comments: Mod assist +2 for RLE support and trunk support. Used bed pad to assist with scoot. Uses bed rail minimally.  Transfers Overall transfer level: Needs assistance Equipment used: Rolling walker (2 wheeled) Transfers: Sit to/from Raymond Larsen Sit to Stand: Max assist;+2 physical assistance   Squat pivot transfers: Total assist;+2 physical assistance     General transfer comment: Attempted to stand from elevated surfaces with +2 and max assist to boost. Unable to safely perform. Requires assistanc to keep  RLE at TDWB status. Pt apprehensive and unable to stand upright. Total assist transfer +2 to chair. VC to maintain TDWB on RLE.  Ambulation/Gait                Stairs            Wheelchair Mobility    Modified Rankin (Stroke Patients Only)       Balance Overall balance assessment: Needs assistance;History of Falls Sitting-balance support: No upper extremity supported;Feet supported Sitting balance-Leahy Scale: Fair     Standing balance support: Bilateral upper extremity supported Standing balance-Leahy Scale: Zero                               Pertinent Vitals/Pain Pt reports no pain at rest, but increases with movement. No numerical value given Patient repositioned in chair for comfort.     Home Living Family/patient expects to be discharged to:: Skilled nursing facility   Available Help at Discharge: [place]: [name];Walker - 4 wheels;Cane - single point;Bedside commode;Shower seat;Electric scooter;Wheelchair - manual      Prior Function Level of Independence: Needs assistance   Gait / Transfers Assistance Needed: Reports he could ambulate with a walker, but sometimes needed help.   ADL's / Homemaking Assistance Needed: Reports someone would help him bathe, and dress.        Hand Dominance   Dominant Hand: Right    Extremity/Trunk Assessment   Upper Extremity Assessment: Defer to OT evaluation  Lower Extremity Assessment: RLE deficits/detail RLE Deficits / Details: decreased strength and ROM - limited assessment due to pain and WB restrictions.       Communication   Communication: HOH  Cognition Arousal/Alertness: Awake/alert Behavior During Therapy: WFL for tasks assessed/performed Overall Cognitive Status: No family/caregiver present to determine baseline cognitive functioning (Disoriented to place and situation. Oriented to month.)       Memory:  Decreased recall of precautions;Decreased short-term memory              General Comments General comments (skin integrity, edema, etc.): Pitting edema bilateral LEs. Significant edema over left elbow with pitting >3 seconds. Reports this is not a new symptom. Nurse notified    Exercises General Exercises - Lower Extremity Ankle Circles/Pumps: AAROM;Both;10 reps;Seated Long Arc Quad: Strengthening;Both;15 reps      Assessment/Plan    PT Assessment Patient needs continued PT services  PT Diagnosis Difficulty walking;Acute pain;Generalized weakness   PT Problem List Decreased strength;Decreased range of motion;Decreased activity tolerance;Decreased balance;Decreased mobility;Decreased coordination;Decreased knowledge of use of DME;Decreased knowledge of precautions;Pain  PT Treatment Interventions DME instruction;Gait training;Functional mobility training;Therapeutic exercise;Therapeutic activities;Balance training;Neuromuscular re-education;Cognitive remediation;Patient/family education;Wheelchair mobility training;Modalities   PT Goals (Current goals can be found in the Care Plan section) Acute Rehab PT Goals Patient Stated Goal: None stated PT Goal Formulation: With patient Time For Goal Achievement: 11/11/13 Potential to Achieve Goals: Fair    Frequency Min 3X/week   Barriers to discharge        Co-evaluation               End of Session Equipment Utilized During Treatment: Gait belt Activity Tolerance: Patient tolerated treatment well Patient left: in chair;with call bell/phone within reach Nurse Communication: Mobility status;Need for lift equipment;Other (comment) ([place])         Time: 4765-4650 PT Time Calculation (min): 31 min   Charges:   PT Evaluation $Initial PT Evaluation Tier I: 1 Procedure PT Treatments $Therapeutic Activity: 8-22 mins   PT G Codes:        Raymond Larsen, [place]   Raymond Larsen 11/04/2013, 2:19 PM

## 2013-11-05 LAB — PREPARE RBC (CROSSMATCH)

## 2013-11-05 LAB — URINE CULTURE
Colony Count: NO GROWTH
Culture: NO GROWTH

## 2013-11-05 LAB — CBC
HCT: 23.2 % — ABNORMAL LOW (ref 39.0–52.0)
Hemoglobin: 7.9 g/dL — ABNORMAL LOW (ref 13.0–17.0)
MCH: 31.2 pg (ref 26.0–34.0)
MCHC: 34.1 g/dL (ref 30.0–36.0)
MCV: 91.7 fL (ref 78.0–100.0)
Platelets: 152 10*3/uL (ref 150–400)
RBC: 2.53 MIL/uL — AB (ref 4.22–5.81)
RDW: 15.3 % (ref 11.5–15.5)
WBC: 7 10*3/uL (ref 4.0–10.5)

## 2013-11-05 LAB — IRON AND TIBC
IRON: 20 ug/dL — AB (ref 42–135)
Saturation Ratios: 12 % — ABNORMAL LOW (ref 20–55)
TIBC: 166 ug/dL — AB (ref 215–435)
UIBC: 146 ug/dL (ref 125–400)

## 2013-11-05 LAB — BASIC METABOLIC PANEL
Anion gap: 10 (ref 5–15)
BUN: 24 mg/dL — ABNORMAL HIGH (ref 6–23)
CHLORIDE: 104 meq/L (ref 96–112)
CO2: 23 meq/L (ref 19–32)
CREATININE: 1.05 mg/dL (ref 0.50–1.35)
Calcium: 7.4 mg/dL — ABNORMAL LOW (ref 8.4–10.5)
GFR calc Af Amer: 71 mL/min — ABNORMAL LOW (ref >90)
GFR calc non Af Amer: 61 mL/min — ABNORMAL LOW (ref >90)
Glucose, Bld: 85 mg/dL (ref 70–99)
Potassium: 4 mEq/L (ref 3.7–5.3)
Sodium: 137 mEq/L (ref 137–147)

## 2013-11-05 LAB — HEMOGLOBIN AND HEMATOCRIT, BLOOD
HCT: 22.9 % — ABNORMAL LOW (ref 39.0–52.0)
HEMATOCRIT: 26.6 % — AB (ref 39.0–52.0)
HEMOGLOBIN: 7.9 g/dL — AB (ref 13.0–17.0)
Hemoglobin: 9 g/dL — ABNORMAL LOW (ref 13.0–17.0)

## 2013-11-05 LAB — FERRITIN: FERRITIN: 162 ng/mL (ref 22–322)

## 2013-11-05 LAB — FOLATE: Folate: 8.3 ng/mL

## 2013-11-05 LAB — VITAMIN B12: Vitamin B-12: 1277 pg/mL — ABNORMAL HIGH (ref 211–911)

## 2013-11-05 MED ORDER — PANTOPRAZOLE SODIUM 40 MG PO TBEC
40.0000 mg | DELAYED_RELEASE_TABLET | Freq: Two times a day (BID) | ORAL | Status: DC
  Administered 2013-11-05 – 2013-11-07 (×4): 40 mg via ORAL
  Filled 2013-11-05 (×4): qty 1

## 2013-11-05 MED ORDER — FUROSEMIDE 10 MG/ML IJ SOLN
20.0000 mg | Freq: Once | INTRAMUSCULAR | Status: AC
  Administered 2013-11-05: 20 mg via INTRAVENOUS
  Filled 2013-11-05: qty 2

## 2013-11-05 MED ORDER — DIPHENHYDRAMINE HCL 50 MG/ML IJ SOLN
25.0000 mg | Freq: Four times a day (QID) | INTRAMUSCULAR | Status: DC | PRN
  Administered 2013-11-05: 25 mg via INTRAVENOUS
  Filled 2013-11-05: qty 1

## 2013-11-05 NOTE — ED Provider Notes (Signed)
Medical screening examination/treatment/procedure(s) were conducted as a shared visit with non-physician practitioner(s) or resident and myself. I personally evaluated the patient during the encounter and agree with the findings and plan unless otherwise indicated.  I have personally reviewed any xrays and/ or EKG's with the provider and I agree with interpretation.  Patient from the nursing home with high blood pressure, lipids, he said, pneumonia, prostate issues, cirrhosis, varices, CHF presents with worsening right hip pain since fall 1 week ago. Patient's had worsening pain since and family wanted him evaluated. Patient also had mild general worsening confusion, family states he has been given his lactulose and meds as directed. Denies fever chills or recent illness. On exam generally weak, pain with any movement of the right hip, mild lower extremity edema bilateral worse in the right, mild dry mucous membranes, abdomen nontender, no neck pain for 40 range of motion head and neck, mild dry mucous membranes. Plan for metabolic workup and imaging for falls and CT head. Abx for UTI.   Fall, right hip pain, right hip fx, confusion, UTI   Mariea Clonts, MD 11/05/13 [phone]

## 2013-11-05 NOTE — Progress Notes (Signed)
Patient ID: Raymond Pasillas Sr., male   DOB: Jan 09, 1923, 80 y.o.   MRN: [id number]         PATIENT DETAILS Name: Raymond Lema Sr. Age: 80 y.o. Sex: male Date of Birth: [birth year] Admit Date: 11/03/2013 Admitting Physician Toy Baker, MD YBO:[id number],ENIDPO H, MD  Subjective: Setting up in hospital bed, denies any fever chills, no headache, mild lightheadedness from time to time which he says is chronic, no chest or abdominal pain. No focal weakness.    Assessment/Plan:    Pelvic and hip fractures: -Spoke with Dr Percell Miller, he reviewed the Xray/CT scans, recommended supportive care, no surgery need. Continue with  pain control, PT eval. will require placement.     Worsening anemia -Has hx of chronic anemia, with baseline Hb around 9-10, slight drop in Hb this admit, no overt evidence of active GI bleeding-although hemoccult is positive , some element of dilution, anemia panel stable, placed on BID PPI, transfuse one unit of packed RBC on 11/06/2011 as he is hypotensive.  Needs outpatient GI workup as appropriate for his age.     Urinary Tract Infection -c/w rocephin await results of urine culture, afebrile with no leukocytosis now.    Acute Encephalopathy -likely secondary UTI, NH3 levels normal on admission -improved, suspect at baseline -c/w Rocephin    Hx of Liver Cirrhoses -stable -restarting Lasix,c/w Lactulose   Hx of Atrial Fibrilaltion -rate controlled with Metoprolol -not a candidate for anticoagulation given falls, and prior bleeding episodes   Hx of Hypothyroidism -c/w Levothyroxine  TSH around 5, repeat TSH along with free T3 and T4 in 4 weeks.   Hypertension  -BP contr0lled- continue metoprolol    Chronic Diastolic Heart Failure: With EF of 55-60% on recent echo  -Continue beta blockers and IV Lasix as tolerated by blood pressure he does have chronic edema.       Disposition: SNF on stable    DVT Prophylaxis:  SCD's     Code  Status: DNR     Family Communication No family members present at this time. Patient states he has four living daughters.    Procedures:  CT head, CT pelvis,   CONSULTS:  orthopedic surgery     Time spent 40 minutes-which includes 50% of the time with face-to-face with patient/ family and coordinating care related to the above assessment and plan.  MEDICATIONS: Scheduled Meds: . aspirin EC  81 mg Oral QHS  . cefTRIAXone (ROCEPHIN) IVPB 1 gram/50 mL D5W  1 g Intravenous Q24H  . docusate sodium  100 mg Oral BID  . finasteride  5 mg Oral QHS  . furosemide  20 mg Intravenous Once  . furosemide  40 mg Oral Daily  . lactulose  30 g Oral Daily  . levothyroxine  100 mcg Oral QAC breakfast  . metoprolol tartrate  12.5 mg Oral BID  . mirtazapine  30 mg Oral QHS  . pantoprazole  40 mg Oral Daily  . senna  1 tablet Oral BID   Continuous Infusions:  PRN Meds:.acetaminophen, acetaminophen, bisacodyl, diphenhydrAMINE, guaiFENesin-dextromethorphan, HYDROcodone-acetaminophen, levalbuterol, morphine injection, nitroGLYCERIN, ondansetron (ZOFRAN) IV, polyethylene glycol  Antibiotics: Anti-infectives   Start     Dose/Rate Route Frequency Ordered Stop   11/04/13 2300  cefTRIAXone (ROCEPHIN) 1 g in dextrose 5 % 50 mL IVPB     1 g 100 mL/hr over 30 Minutes Intravenous Every 24 hours 11/04/13 0137     11/04/13 0015  cefTRIAXone (ROCEPHIN) 1 g in dextrose 5 % 50 mL IVPB  1 g 100 mL/hr over 30 Minutes Intravenous  Once 11/04/13 0004 11/04/13 0058       PHYSICAL EXAM: Vital signs in last 24 hours: Filed Vitals:   11/04/13 1239 11/04/13 2057 11/05/13 0523 11/05/13 0932  BP: 104/55 116/61 91/45 88/40   Pulse: 86 94 102   Temp: 98.2 F (36.8 C) 98.9 F (37.2 C) 98.4 F (36.9 C)   TempSrc: Oral Oral Oral   Resp: 18 18 18    Height:      Weight:      SpO2: 94% 99% 95%     Weight change:  Filed Weights   11/03/13 1824  Weight: 68.04 kg (150 lb)   Body mass index is 24.22  kg/(m^2).   Gen Exam: Awake and alert with clear speech, in no apparent distress.  Neck: Supple, No JVD.   Chest: B/L Clear CVS: S1 S2 Regular, no murmurs.  Abdomen: soft, BS +, non tender, non distended. Extremities: 1+ pitting edema in lower extremities bilaterally, lower extremities warm to touch. Neurologic: Non Focal.   Skin: No Rash.  Wounds: N/A.   Intake/Output from previous day:  Intake/Output Summary (Last 24 hours) at 11/05/13 1314 Last data filed at 11/05/13 0701  Gross per 24 hour  Intake    240 ml  Output      0 ml  Net    240 ml     LAB RESULTS: CBC  Recent Labs Lab 11/03/13 1853 11/04/13 0414 11/05/13 0640 11/05/13 1100  WBC 8.7 7.9 7.0  --   HGB 8.7* 8.7* 7.9* 7.9*  HCT 25.7* 25.5* 23.2* 22.9*  PLT 155 147* 152  --   MCV 92.4 92.7 91.7  --   MCH 31.3 31.6 31.2  --   MCHC 33.9 34.1 34.1  --   RDW 15.1 15.3 15.3  --     Chemistries   Recent Labs Lab 11/03/13 1853 11/04/13 0414 11/05/13 0640  NA 139 134* 137  K 4.1 3.9 4.0  CL 105 100 104  CO2 26 24 23   GLUCOSE 104* 121* 85  BUN 25* 24* 24*  CREATININE 1.12 1.09 1.05  CALCIUM 7.8* 7.7* 7.4*  MG  --  2.2  --     CBG: No results found for this basename: GLUCAP,  in the last 168 hours  GFR Estimated Creatinine Clearance: 43.9 ml/min (by C-G formula based on Cr of 1.05).  Coagulation profile  Recent Labs Lab 11/04/13 0114  INR 1.53*    Cardiac Enzymes No results found for this basename: CK, CKMB, TROPONINI, MYOGLOBIN,  in the last 168 hours  No components found with this basename: POCBNP,  No results found for this basename: DDIMER,  in the last 72 hours No results found for this basename: HGBA1C,  in the last 72 hours No results found for this basename: CHOL, HDL, LDLCALC, TRIG, CHOLHDL, LDLDIRECT,  in the last 72 hours  Recent Labs  11/04/13 0414  TSH 5.220*    Recent Labs  11/04/13 1920  VITAMINB12 1277*  FOLATE 8.3  FERRITIN 162  TIBC 166*  IRON 20*    RETICCTPCT 1.8   No results found for this basename: LIPASE, AMYLASE,  in the last 72 hours  Urine Studies No results found for this basename: UACOL, UAPR, USPG, UPH, UTP, UGL, UKET, UBIL, UHGB, UNIT, UROB, ULEU, UEPI, UWBC, URBC, UBAC, CAST, CRYS, UCOM, BILUA,  in the last 72 hours  MICROBIOLOGY: Recent Results (from the past 240 hour(s))  URINE CULTURE  Status: None   Collection Time    11/03/13  8:59 PM      Result Value Ref Range Status   Specimen Description URINE, CATHETERIZED   Final   Special Requests ADDED 0419 11/04/13   Final   Culture  Setup Time     Final   Value: 11/04/2013 04:42     Performed at [place]     Final   Value: NO GROWTH     Performed at [facility]   Culture     Final   Value: NO GROWTH     Performed at [facility]   Report Status 11/05/2013 FINAL   Final    RADIOLOGY STUDIES/RESULTS: Dg Chest 2 View  11/03/2013   CLINICAL DATA:  Status post fall  EXAM: CHEST  2 VIEW  COMPARISON:  10/15/2013  FINDINGS: There is no focal parenchymal opacity, pleural effusion, or pneumothorax. The heart and mediastinal contours are unremarkable.  There is a right shoulder arthroplasty. 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  IMPRESSION: No active cardiopulmonary disease.   Electronically Signed   By: Kathreen Devoid   On: 11/03/2013 20:18   Dg Hip Complete Right  11/03/2013   CLINICAL DATA:  Right hip pain.  Fall 1 week ago.  EXAM: RIGHT HIP - COMPLETE 2+ VIEW  COMPARISON:  09/03/2013  FINDINGS: Left femur IM nail and helical hip screw noted. This traverses the prior intertrochanteric fracture.  Acute fracture, right pubic body and adjacent portions of the superior and inferior pubic rami. Possible fracture of the right sacrum. Bony demineralization.  In addition, there is bony discontinuity in the right lateral trochanter suspicious for at least a trochanteric avulsion fracture.  IMPRESSION: 1. Acute fractures of the right  pubic rami and adjacent pubic body. 2. Probable fracture of the right greater trochanter, possibly avulsion, conceivably a subtle intertrochanteric fracture. 3. Possible right sacral fracture. 4. Old left hip fracture traversed by a helical hip screw.   Electronically Signed   By: Sherryl Barters M.D.   On: 11/03/2013 20:24   Ct Head Wo Contrast  11/03/2013   CLINICAL DATA:  Status post fall  EXAM: CT HEAD WITHOUT CONTRAST  TECHNIQUE: Contiguous axial images were obtained from the base of the skull through the vertex without intravenous contrast.  COMPARISON:  10/15/2013  FINDINGS: There is no evidence of mass effect, midline shift, or extra-axial fluid collections. There is no evidence of a space-occupying lesion or intracranial hemorrhage. There is no evidence of a cortical-based area of acute infarction. There is generalized cerebral atrophy. There is periventricular white matter low attenuation likely secondary to microangiopathy.  The ventricles and sulci are appropriate for the patient's age. The basal cisterns are patent.  Visualized portions of the orbits are unremarkable. The visualized portions of the paranasal sinuses and mastoid air cells are unremarkable. Cerebrovascular atherosclerotic calcifications are noted.  The osseous structures are unremarkable.  IMPRESSION: No acute intracranial pathology.   Electronically Signed   By: Kathreen Devoid   On: 11/03/2013 19:43      Ct Pelvis Wo Contrast  11/03/2013   CLINICAL DATA:  Fall with hip pain  EXAM: CT PELVIS WITHOUT CONTRAST  TECHNIQUE: Multidetector CT imaging of the pelvis was performed following the standard protocol without intravenous contrast.  COMPARISON:  Pelvis radiograph from the same day  FINDINGS: Right superior and inferior pubic ramus fractures with mild bony impaction. The fractures continue into the right pubic body.  There is a nondisplaced fracture through the right sacral ala, not affecting the sacral foramina.  There is a  comminuted, nondisplaced fracture involving the right greater trochanter. No visible continuation into the intertrochanteric region or femoral neck.  Remote intertrochanteric left femur fracture status post ORIF. There is no evidence of acute hardware complication. Chronic loose body in the anterior left hip joint measuring 16 mm.  Spiculated sclerotic focus in the posterior left ilium is stable from prior, morphology favoring bone island.  Neighboring the superior pubic ramus fracture is a extraperitoneal hematoma which mildly deforms the right bladder. There is no visible bladder wall thickening to suggests injury. Ascites in the lower abdomen in this patient with known cirrhosis. Small volume pneumaturia, usually from manipulation.  IMPRESSION: 1. Mildly impacted right obturator ring fractures. 2. Nondisplaced right sacral ala fracture. No sacral foramina involvement. 3. Nondisplaced greater trochanter fracture of the right femur. No visible continuation into the intertrochanteric or femoral neck regions. 4. Chronic findings are noted above.   Electronically Signed   By: Jorje Guild M.D.   On: 11/03/2013 22:58         Thurnell Lose M.D on 11/05/2013 at 1:14 PM  Between 7am to 7pm - Pager - [phone], After 7pm go to [email or web address] - password [email or web address]  And look for the night coverage person covering me after hours  [place]   LOS: 2 days   **Disclaimer: This note may have been dictated with voice recognition software. Similar sounding words can inadvertently be transcribed and this note may contain transcription errors which may not have been corrected upon publication of note.**

## 2013-11-06 LAB — TYPE AND SCREEN
ABO/RH(D): O POS
Antibody Screen: POSITIVE
DAT, IGG: NEGATIVE
Unit division: 0

## 2013-11-06 LAB — CBC
HCT: 25.7 % — ABNORMAL LOW (ref 39.0–52.0)
Hemoglobin: 8.9 g/dL — ABNORMAL LOW (ref 13.0–17.0)
MCH: 30.5 pg (ref 26.0–34.0)
MCHC: 34.6 g/dL (ref 30.0–36.0)
MCV: 88 fL (ref 78.0–100.0)
PLATELETS: 137 10*3/uL — AB (ref 150–400)
RBC: 2.92 MIL/uL — AB (ref 4.22–5.81)
RDW: 15.7 % — ABNORMAL HIGH (ref 11.5–15.5)
WBC: 7.4 10*3/uL (ref 4.0–10.5)

## 2013-11-06 MED ORDER — CEFUROXIME AXETIL 500 MG PO TABS: 500.0000 mg | ORAL_TABLET | Freq: Two times a day (BID) | ORAL | Status: AC

## 2013-11-06 MED ORDER — HYDROCODONE-ACETAMINOPHEN 5-325 MG PO TABS: 1.0000 | ORAL_TABLET | ORAL | Status: AC | PRN

## 2013-11-06 NOTE — Progress Notes (Signed)
Physical Therapy Treatment Patient Details Name: Raymond Maniscalco Sr. MRN: [id number] DOB: 15-Jan-1923 Today's Date: 11/06/2013    History of Present Illness 80 y.o. male admitted after a fall resulting in a greater trochanter fracture of the right femur,  right sacral ala fracture, and right obturator ring fractures. [name] has elected to treat non-operatively at this time.  Hx of HTN, a-fib, GERD, bilateral hearing loss, and CHF.    PT Comments    Patient slowly progressing towards physical therapy goals. Able to perform stand pivot transfer with therapy today but required mod assist with consistent VC and physical corrections to maintain TDWB on RLE. Marland KitchenPatient will continue to benefit from skilled physical therapy services at SNF to further improve independence with functional mobility.    Follow Up Recommendations  SNF;Supervision/Assistance - 24 hour     Equipment Recommendations  None recommended by PT    Recommendations for Other Services       Precautions / Restrictions Precautions Precautions: Fall Restrictions Weight Bearing Restrictions: Yes RLE Weight Bearing: Touchdown weight bearing    Mobility  Bed Mobility Overal bed mobility: Needs Assistance;+2 for physical assistance Bed Mobility: Supine to Sit     Supine to sit: Mod assist;+2 for physical assistance;HOB elevated     General bed mobility comments: Mod assist +2 for RLE support and trunk support. Used bed pad to assist with scoot. Uses bed rail minimally.  Transfers Overall transfer level: Needs assistance Equipment used: Rolling walker (2 wheeled) Transfers: Stand Pivot Transfers;Sit to/from Stand Sit to Stand: Mod assist;+2 physical assistance Stand pivot transfers: Mod assist       General transfer comment: Mod assist with sit<>stand +2 for boost and physical assist to maintain TDWB. Unable to maintain TDWB without assist and max VC. Mod assist with pivot transfer and constant assistance to maintain TDWB  on RLE.  Ambulation/Gait                 Stairs            Wheelchair Mobility    Modified Rankin (Stroke Patients Only)       Balance                                    Cognition Arousal/Alertness: Awake/alert Behavior During Therapy: WFL for tasks assessed/performed Overall Cognitive Status: Within Functional Limits for tasks assessed       Memory: Decreased short-term memory;Decreased recall of precautions              Exercises General Exercises - Lower Extremity Ankle Circles/Pumps: Both;10 reps;Seated;AROM    General Comments General comments (skin integrity, edema, etc.): Continues to have bilateral pitting edema in LEs. Complains of bilateral heel pain - Re-positioned with heels floating.      Pertinent Vitals/Pain Pt reports mild soreness with mobility Patient repositioned in chair for comfort.     Home Living                      Prior Function            PT Goals (current goals can now be found in the care plan section) Acute Rehab PT Goals PT Goal Formulation: With patient Time For Goal Achievement: 11/11/13 Potential to Achieve Goals: Fair Progress towards PT goals: Progressing toward goals    Frequency  Min 3X/week    PT Plan Current plan remains appropriate  Co-evaluation             End of Session   Activity Tolerance: Patient tolerated treatment well Patient left: in chair;with call bell/phone within reach;with family/visitor present     Time: 6213-0865 PT Time Calculation (min): 17 min  Charges:  $Therapeutic Activity: 8-22 mins                    G Codes:      Elayne Snare, [place]  Ellouise Newer 11/06/2013, 2:36 PM

## 2013-11-06 NOTE — Discharge Summary (Addendum)
Raymond Lachapelle Sr., is a 80 y.o. male  DOB [birth year]  MRN [id number].  Admission date:  11/03/2013  Admitting Physician  Toy Baker, MD  Discharge Date:  11/07/2013   Primary MD  Tammi Sou, MD  Recommendations for primary care physician for things to follow:   Please see weight bearing orders, SCDs and TED stockings when in bed.  Outpatient followup with orthopedics and GI   Check CBC BMP in 3-4 days TSH free T3 and T4 in 4 weeks.   On Dysphagia 1 diet needs continued Speech follow up.     Admission Diagnosis  UTI (lower urinary tract infection) [599.0] Essential hypertension [401.9] Fall, initial encounter [E888.9] Hip fracture, right, closed, initial encounter [820.8] Pelvic fracture, closed, initial encounter [808.8] Anemia, unspecified anemia type [285.9]   Discharge Diagnosis  UTI (lower urinary tract infection) [599.0] Essential hypertension [401.9] Fall, initial encounter [E888.9] Hip fracture, right, closed, initial encounter [820.8] Pelvic fracture, closed, initial encounter [808.8] Anemia, unspecified anemia type [285.9]    Active Problems:   Hypertension   Normocytic anemia   CHF (congestive heart failure)   Pelvic fracture   UTI (urinary tract infection)      Past Medical History  Diagnosis Date  . Arthritis     Shoulders and knees  . Hyperlipidemia   . Hypertension   . A-fib     Dr Wynonia Lawman; WAS on Coumadin until 04/2012  . Angina     uses NTG prn  . Pneumonia     12 /2012.  HC assoc pneumonia/aspiration pneumonia 2015  . Hypothyroidism     x 10 yrs  . GERD (gastroesophageal reflux disease)   . Constipation, chronic   . Depression   . History of dizziness   . Anxiety   . Urinary frequency   . Hearing loss of both ears     wears hearing aides  . BPH with obstruction/lower  urinary tract symptoms     sees Dr Gaynelle Arabian  . Compression fracture of thoracic vertebra, non-traumatic 06/14/12    T6 and T7 (noted on CT angio chest to r/o PE)  . Alcoholic cirrhosis of liver with ascites 06/14/12    Noted on CT angio chest to r/o PE  . Esophageal varices in alcoholic cirrhosis "   "    "   "    "  . Intertrochanteric fracture of left hip 05/24/2013  . HCAP (healthcare-associated pneumonia) 07/13/2013  . Postoperative anemia due to acute blood loss 05/27/2013  . CHF (congestive heart failure) 10/15/2013  . Prostate cancer 1992    Past Surgical History  Procedure Laterality Date  . Total knee arthroplasty Right 1994  . Total shoulder replacement Right 1997  . Cholecystectomy  2004  . Inguinal hernia repair  05/09/2011    Procedure: HERNIA REPAIR INGUINAL ADULT;  Surgeon: Odis Hollingshead, MD;  Location: [facility];  Service: General;  Laterality: Right;  . Femur im nail Left 05/25/2013    Procedure: INTRAMEDULLARY (IM) NAIL FEMORAL;  Surgeon: Lenetta Quaker  Mardelle Matte, MD;  Location: [facility];  Service: Orthopedics;  Laterality: Left;  . Femoral hernia repair Right 07/13/2013    Procedure: REPAIR RIGHT INCARCERATED FEMORAL HERNIA WITH MESH;  Surgeon: Stark Klein, MD;  Location: [facility];  Service: General;  Laterality: Right;  . Laparotomy N/A 07/13/2013    Procedure: EXPLORATORY LAPAROTOMY;  Surgeon: Stark Klein, MD;  Location: [facility];  Service: General;  Laterality: N/A;  . Bowel resection N/A 07/13/2013    Procedure: SMALL BOWEL RESECTION;  Surgeon: Stark Klein, MD;  Location: [facility];  Service: General;  Laterality: N/A;  . Joint replacement    . Inguinal hernia repair Left 1990  . Inguinal hernia repair Right 04/2011  . Cataract extraction w/ intraocular lens  implant, bilateral Bilateral 2013  . Transurethral resection of prostate  1992       History of present illness and  Hospital Course:     Kindly see H&P for history of present illness and admission details, please review  complete Labs, Consult reports and Test reports for all details in brief  HPI  [name]. is 80 year old gentleman with history of arthritis, dyslipidemia, hypertension, proximal A. fib, CAD, hypothyroidism, GERD, bilateral hearing loss, BPH, alcoholic cirrhosis with esophageal varices, isn't it from [facility] where he had a mechanical fall causing pelvic fracture along with right greater trochanteric femoral fracture, possible right sacral fracture. He was seen by orthopedic surgeon Dr. Percell Miller who recommended conservative management. Patient also had some nonspecific acute on chronic anemia requiring one unit of packed RBC transfusion.   Hospital Course    Pelvic and hip fractures:  -Seen by Dr Percell Miller, he reviewed the Xray/CT scans, recommended supportive care, no surgery need. Continue with pain control, PT follow. Weight bearing - TDWB R leg, WBAT L leg. Must wear SCDs and TED stockings when in bed, continue aspirin home dose.     Acute on chronic anemia of chronic disease  -Has hx of chronic anemia, with baseline Hb around 9-10, slight drop in Hb this admit, no overt evidence of profound GI bleeding-although weakly hemoccult is positive , some element of dilution, anemia panel stable, placed on PPI, transfused one unit of packed RBC on 11/06/2011 .   Needs outpatient GI workup as appropriate for his age.      Urinary Tract Infection  -c/w rocephin await results of urine culture, afebrile with no leukocytosis now. Switched to Ceftin for 3 more days     Acute Encephalopathy  -likely secondary UTI, NH3 levels normal on admission , she mild underlying dementia. -improved, suspect at baseline  -c/w ceftin PO.     Hx of Liver Cirrhoses  -stable  -continue Lasix,c/w Lactulose  -Note patient has a edema in his legs and scrotum, continue Lasix salt and fluid restriction, diurese as tolerated by blood pressure.   Hx of Atrial Fibrilaltion  -rate controlled with  Metoprolol  -not a candidate for anticoagulation given falls, and prior bleeding episodes      Hx of Hypothyroidism  -c/w Levothyroxine  TSH around 5, repeat TSH along with free T3 and T4 in 4 weeks.      Hypertension  -BP contr0lled- continue metoprolol    Chronic Diastolic Heart Failure: With EF of 55-60% on recent echo  -Continue beta blockers and IV Lasix as tolerated by blood pressure he does have chronic edema.       Discharge Condition: stable   Follow UP  Follow-up Information   Follow up with MURPHY, TIMOTHY, D,  MD In 2 weeks.   Specialty:  Orthopedic Surgery   Contact information:   [address] [place] [address] [phone]       Follow up with Tammi Sou, MD. Schedule an appointment as soon as possible for a visit in 1 week.   Specialty:  Family Medicine   Contact information:   [address] [place] [address] [phone]         Discharge Instructions  and  Discharge Medications          Discharge Instructions   Discharge instructions    Complete by:  As directed   TDWB R leg, WBAT L leg   Follow with Primary MD MCGOWEN,PHILIP H, MD in 7 days   Get CBC, CMP, 2 view Chest X ray checked  by Primary MD next visit.    Activity: TDWB R leg, WBAT L leg with Full fall precautions use walker/cane & assistance as needed   Disposition SNF   Diet: Dysphagia 1 Heart Healthy with feeding assistance and Asp precautions.   Check your Weight same time everyday, if you gain over 2 pounds, or you develop in leg swelling, experience more shortness of breath or chest pain, call your Primary MD immediately. Follow Cardiac Low Salt Diet and 1.5 lit/day fluid restriction.   On your next visit with her primary care physician please Get Medicines reviewed and adjusted.  Please request your Prim.MD to go over all Hospital Tests and Procedure/Radiological results at the follow up, please get all Hospital records  sent to your Prim MD by signing hospital release before you go home.   If you experience worsening of your admission symptoms, develop shortness of breath, life threatening emergency, suicidal or homicidal thoughts you must seek medical attention immediately by calling 911 or calling your MD immediately  if symptoms less severe.  You Must read complete instructions/literature along with all the possible adverse reactions/side effects for all the Medicines you take and that have been prescribed to you. Take any new Medicines after you have completely understood and accpet all the possible adverse reactions/side effects.   Do not drive, operating heavy machinery, perform activities at heights, swimming or participation in water activities or provide baby sitting services if your were admitted for syncope or siezures until you have seen by Primary MD or a Neurologist and advised to do so again.  Do not drive when taking Pain medications.    Do not take more than prescribed Pain, Sleep and Anxiety Medications  Special Instructions: If you have smoked or chewed Tobacco  in the last 2 yrs please stop smoking, stop any regular Alcohol  and or any Recreational drug use.  Wear Seat belts while driving.   Please note  You were cared for by a hospitalist during your hospital stay. If you have any questions about your discharge medications or the care you received while you were in the hospital after you are discharged, you can call the unit and asked to speak with the hospitalist on call if the hospitalist that took care of you is not available. Once you are discharged, your primary care physician will handle any further medical issues. Please note that NO REFILLS for any discharge medications will be authorized once you are discharged, as it is imperative that you return to your primary care physician (or establish a relationship with a primary care physician if you do not have one) for your aftercare  needs so that  they can reassess your need for medications and monitor your lab values.  TDWB R leg, WBAT L leg   Follow with Primary MD MCGOWEN,PHILIP H, MD in 7 days   Get CBC, CMP, 2 view Chest X ray checked  by Primary MD next visit.    Activity: TDWB R leg, WBAT L leg with Full fall precautions use walker/cane & assistance as needed   Disposition SNF   Diet: Dysphagia 1 Heart Healthy with feeding assistance and Asp precautions.   Check your Weight same time everyday, if you gain over 2 pounds, or you develop in leg swelling, experience more shortness of breath or chest pain, call your Primary MD immediately. Follow Cardiac Low Salt Diet and 1.5 lit/day fluid restriction.   On your next visit with her primary care physician please Get Medicines reviewed and adjusted.  Please request your Prim.MD to go over all Hospital Tests and Procedure/Radiological results at the follow up, please get all Hospital records sent to your Prim MD by signing hospital release before you go home.   If you experience worsening of your admission symptoms, develop shortness of breath, life threatening emergency, suicidal or homicidal thoughts you must seek medical attention immediately by calling 911 or calling your MD immediately  if symptoms less severe.  You Must read complete instructions/literature along with all the possible adverse reactions/side effects for all the Medicines you take and that have been prescribed to you. Take any new Medicines after you have completely understood and accpet all the possible adverse reactions/side effects.   Do not drive, operating heavy machinery, perform activities at heights, swimming or participation in water activities or provide baby sitting services if your were admitted for syncope or siezures until you have seen by Primary MD or a Neurologist and advised to do so again.  Do not drive when taking Pain medications.    Do not take more than prescribed  Pain, Sleep and Anxiety Medications  Special Instructions: If you have smoked or chewed Tobacco  in the last 2 yrs please stop smoking, stop any regular Alcohol  and or any Recreational drug use.  Wear Seat belts while driving.   Please note  You were cared for by a hospitalist during your hospital stay. If you have any questions about your discharge medications or the care you received while you were in the hospital after you are discharged, you can call the unit and asked to speak with the hospitalist on call if the hospitalist that took care of you is not available. Once you are discharged, your primary care physician will handle any further medical issues. Please note that NO REFILLS for any discharge medications will be authorized once you are discharged, as it is imperative that you return to your primary care physician (or establish a relationship with a primary care physician if you do not have one) for your aftercare needs so that they can reassess your need for medications and monitor your lab values.            Medication List    STOP taking these medications       levofloxacin 750 MG tablet  Commonly known as:  LEVAQUIN      TAKE these medications       aspirin EC 81 MG tablet  Take 81 mg by mouth at bedtime.     cefUROXime 500 MG tablet  Commonly known as:  CEFTIN  Take 1 tablet (500 mg total) by mouth 2 (two) times  daily with a meal. 3 more days     finasteride 5 MG tablet  Commonly known as:  PROSCAR  Take 5 mg by mouth at bedtime.     furosemide 40 MG tablet  Commonly known as:  LASIX  Take 1 tablet (40 mg total) by mouth daily.     guaiFENesin-dextromethorphan 100-10 MG/5ML syrup  Commonly known as:  ROBITUSSIN DM  Take 5 mLs by mouth every 4 (four) hours as needed for cough.     HYDROcodone-acetaminophen 5-325 MG per tablet  Commonly known as:  NORCO/VICODIN  Take 1 tablet by mouth every 4 (four) hours as needed (pain).     lactulose 10 GM/15ML  solution  Commonly known as:  CHRONULAC  Take 30 g by mouth daily.     levalbuterol 0.63 MG/3ML nebulizer solution  Commonly known as:  XOPENEX  Take 3 mLs (0.63 mg total) by nebulization every 6 (six) hours as needed for wheezing or shortness of breath.     levothyroxine 100 MCG tablet  Commonly known as:  SYNTHROID, LEVOTHROID  Take 100 mcg by mouth daily before breakfast.     metoprolol tartrate 25 MG tablet  Commonly known as:  LOPRESSOR  Take 0.5 tablets (12.5 mg total) by mouth 2 (two) times daily.     mirtazapine 30 MG tablet  Commonly known as:  REMERON  Take 30 mg by mouth at bedtime.     nitroGLYCERIN 0.4 MG SL tablet  Commonly known as:  NITROSTAT  Place 0.4 mg under the tongue every 5 (five) minutes as needed for chest pain. Give 1 tablet every 5 minutes for chest pain x 3 doses.  If unrelieved after 3 doses, call MD     pantoprazole 40 MG tablet  Commonly known as:  PROTONIX  Take 40 mg by mouth daily.     vitamin B-12 1000 MCG tablet  Commonly known as:  CYANOCOBALAMIN  Take 1,000 mcg by mouth daily.          Diet and Activity recommendation: See Discharge Instructions above   Consults obtained - orthopedics Dr. Percell Miller   Major procedures and Radiology Reports - PLEASE review detailed and final reports for all details, in brief -       Dg Chest 2 View  11/03/2013   CLINICAL DATA:  Status post fall  EXAM: CHEST  2 VIEW  COMPARISON:  10/15/2013  FINDINGS: There is no focal parenchymal opacity, pleural effusion, or pneumothorax. The heart and mediastinal contours are unremarkable.  There is a right shoulder arthroplasty. 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  IMPRESSION: No active cardiopulmonary disease.   Electronically Signed   By: Kathreen Devoid   On: 11/03/2013 20:18   Dg Hip Complete Right  11/03/2013   CLINICAL DATA:  Right hip pain.  Fall 1 week ago.  EXAM: RIGHT HIP - COMPLETE 2+ VIEW  COMPARISON:  09/03/2013  FINDINGS: Left femur IM nail  and helical hip screw noted. This traverses the prior intertrochanteric fracture.  Acute fracture, right pubic body and adjacent portions of the superior and inferior pubic rami. Possible fracture of the right sacrum. Bony demineralization.  In addition, there is bony discontinuity in the right lateral trochanter suspicious for at least a trochanteric avulsion fracture.  IMPRESSION: 1. Acute fractures of the right pubic rami and adjacent pubic body. 2. Probable fracture of the right greater trochanter, possibly avulsion, conceivably a subtle intertrochanteric fracture. 3. Possible right sacral fracture. 4. Old left hip fracture traversed  by a helical hip screw.   Electronically Signed   By: Sherryl Barters M.D.   On: 11/03/2013 20:24   Ct Head Wo Contrast  11/03/2013   CLINICAL DATA:  Status post fall  EXAM: CT HEAD WITHOUT CONTRAST  TECHNIQUE: Contiguous axial images were obtained from the base of the skull through the vertex without intravenous contrast.  COMPARISON:  10/15/2013  FINDINGS: There is no evidence of mass effect, midline shift, or extra-axial fluid collections. There is no evidence of a space-occupying lesion or intracranial hemorrhage. There is no evidence of a cortical-based area of acute infarction. There is generalized cerebral atrophy. There is periventricular white matter low attenuation likely secondary to microangiopathy.  The ventricles and sulci are appropriate for the patient's age. The basal cisterns are patent.  Visualized portions of the orbits are unremarkable. The visualized portions of the paranasal sinuses and mastoid air cells are unremarkable. Cerebrovascular atherosclerotic calcifications are noted.  The osseous structures are unremarkable.  IMPRESSION: No acute intracranial pathology.   Electronically Signed   By: Kathreen Devoid   On: 11/03/2013 19:43   Ct Head Wo Contrast  10/15/2013   CLINICAL DATA:  syncope syncope  EXAM: CT HEAD WITHOUT CONTRAST  TECHNIQUE: Contiguous  axial images were obtained from the base of the skull through the vertex without intravenous contrast.  COMPARISON:  Head CT dated 01/08/2014  FINDINGS: No acute intracranial abnormality. Specifically, no hemorrhage, hydrocephalus, mass lesion, acute infarction, or significant intracranial injury. The bony calvarium appears intact. Diffuse areas of low attenuation within the subcortical, deep, and periventricular white matter regions consistent small vessel at matter ischemia. Age appropriate global atrophy. Visualized paranasal sinuses and mastoid air cells are patent.  IMPRESSION: Chronic involutional changes.  No acute intracranial abnormality.   Electronically Signed   By: Margaree Mackintosh M.D.   On: 10/15/2013 12:09   Ct Pelvis Wo Contrast  11/03/2013   CLINICAL DATA:  Fall with hip pain  EXAM: CT PELVIS WITHOUT CONTRAST  TECHNIQUE: Multidetector CT imaging of the pelvis was performed following the standard protocol without intravenous contrast.  COMPARISON:  Pelvis radiograph from the same day  FINDINGS: Right superior and inferior pubic ramus fractures with mild bony impaction. The fractures continue into the right pubic body. There is a nondisplaced fracture through the right sacral ala, not affecting the sacral foramina.  There is a comminuted, nondisplaced fracture involving the right greater trochanter. No visible continuation into the intertrochanteric region or femoral neck.  Remote intertrochanteric left femur fracture status post ORIF. There is no evidence of acute hardware complication. Chronic loose body in the anterior left hip joint measuring 16 mm.  Spiculated sclerotic focus in the posterior left ilium is stable from prior, morphology favoring bone island.  Neighboring the superior pubic ramus fracture is a extraperitoneal hematoma which mildly deforms the right bladder. There is no visible bladder wall thickening to suggests injury. Ascites in the lower abdomen in this patient with known  cirrhosis. Small volume pneumaturia, usually from manipulation.  IMPRESSION: 1. Mildly impacted right obturator ring fractures. 2. Nondisplaced right sacral ala fracture. No sacral foramina involvement. 3. Nondisplaced greater trochanter fracture of the right femur. No visible continuation into the intertrochanteric or femoral neck regions. 4. Chronic findings are noted above.   Electronically Signed   By: Jorje Guild M.D.   On: 11/03/2013 22:58   Dg Chest Port 1 View  10/15/2013   CLINICAL DATA:  Edema, history GERD, hypertension, atrial fibrillation, alcoholic cirrhosis with esophageal  varices  EXAM: PORTABLE CHEST - 1 VIEW  COMPARISON:  Portable exam 1021 hr compared to 09/03/2013  FINDINGS: Enlargement of cardiac silhouette with pulmonary vascular congestion.  Perihilar and basilar infiltrates likely pulmonary edema.  No gross pleural effusion or pneumothorax.  Bones demineralized.  Prior RIGHT shoulder replacement with LEFT glenohumeral degenerative changes.  Atherosclerotic calcification aorta.  IMPRESSION: BILATERAL pulmonary infiltrates favor pulmonary edema over infection.  Enlargement of cardiac silhouette with pulmonary vascular congestion.   Electronically Signed   By: Lavonia Dana M.D.   On: 10/15/2013 11:12    Micro Results      Recent Results (from the past 240 hour(s))  URINE CULTURE     Status: None   Collection Time    11/03/13  8:59 PM      Result Value Ref Range Status   Specimen Description URINE, CATHETERIZED   Final   Special Requests ADDED 0419 11/04/13   Final   Culture  Setup Time     Final   Value: 11/04/2013 04:42     Performed at [facility]     Final   Value: NO GROWTH     Performed at [facility]   Culture     Final   Value: NO GROWTH     Performed at [facility]   Report Status 11/05/2013 FINAL   Final       Today   Subjective:   Maryla Morrow today has no headache,no chest abdominal pain,no new weakness  tingling or numbness, feels much better.   Objective:   Blood pressure 108/58, pulse 84, temperature 98.4 F (36.9 C), temperature source Oral, resp. rate 16, height 5\' 6"  (1.676 m), weight 68.04 kg (150 lb), SpO2 97.00%.   Intake/Output Summary (Last 24 hours) at 11/07/13 0747 Last data filed at 11/07/13 0611  Gross per 24 hour  Intake    720 ml  Output    200 ml  Net    520 ml    Exam Awake , pleasantly confused, No new F.N deficits, Normal affect [place].AT,PERRAL Supple Neck,No JVD, No cervical lymphadenopathy appriciated.  Symmetrical Chest wall movement, Good air movement bilaterally, CTAB RRR,No Gallops,Rubs or new Murmurs, No Parasternal Heave +ve B.Sounds, Abd Soft, Non tender, No organomegaly appriciated, No rebound -guarding or rigidity. No Cyanosis, Clubbing , chronic leg edema with scrotal edema, No new Rash or bruise  Data Review   CBC w Diff: Lab Results  Component Value Date   WBC 7.4 11/06/2013   HGB 8.9* 11/06/2013   HCT 25.7* 11/06/2013   PLT 137* 11/06/2013   LYMPHOPCT 35 10/15/2013   MONOPCT 11 10/15/2013   EOSPCT 11* 10/15/2013   BASOPCT 1 10/15/2013    CMP: Lab Results  Component Value Date   NA 137 11/05/2013   K 4.0 11/05/2013   CL 104 11/05/2013   CO2 23 11/05/2013   BUN 24* 11/05/2013   CREATININE 1.05 11/05/2013   CREATININE 0.85 07/25/2012   PROT 4.8* 11/04/2013   ALBUMIN 1.5* 11/04/2013   BILITOT 0.7 11/04/2013   ALKPHOS 187* 11/04/2013   AST 33 11/04/2013   ALT 12 11/04/2013  .   Total Time in preparing paper work, data evaluation and todays exam - 35 minutes  Thurnell Lose M.D on 11/07/2013 at 7:47 AM  [facility] Office  [phone]   **Disclaimer: This note may have been dictated with voice recognition software. Similar sounding words can inadvertently be transcribed and this note  may contain transcription errors which may not have been corrected upon publication of note.**

## 2013-11-06 NOTE — Clinical Social Work Note (Signed)
SNF bed offers provided to family- they are wanting to look further at other options- specifically requestign [facility], [place] or [facility]- CSW faxing out to these and will advise as offers are rec'd. Family has been advised that at time of dc, they will have to accept SNF bed where they have been offered and are available- Daughter- Kerin Ransom- understands and is hopeful for "good place for dad to go- doesn't matter if its nearby or not".  CSW covering tomorrow to f/u with offers and for probable dc-  Eduard Clos, MSW, [place]

## 2013-11-07 NOTE — Progress Notes (Signed)
Pt has been accepted at [facility]. PTAR arranged for 3:00 pm pick up. Discharge packet prepared. Pt/family/attending RN made aware. Pt to be discharged.   [address], [place]

## 2013-11-07 NOTE — Discharge Instructions (Signed)
TDWB R leg, WBAT L leg   Follow with Primary MD MCGOWEN,PHILIP H, MD in 7 days   Get CBC, CMP, 2 view Chest X ray checked  by Primary MD next visit.    Activity: TDWB R leg, WBAT L leg with Full fall precautions use walker/cane & assistance as needed   Disposition SNF   Diet: Dysphagia 1 Heart Healthy with feeding assistance and Asp precautions.   Check your Weight same time everyday, if you gain over 2 pounds, or you develop in leg swelling, experience more shortness of breath or chest pain, call your Primary MD immediately. Follow Cardiac Low Salt Diet and 1.5 lit/day fluid restriction.   On your next visit with her primary care physician please Get Medicines reviewed and adjusted.  Please request your Prim.MD to go over all Hospital Tests and Procedure/Radiological results at the follow up, please get all Hospital records sent to your Prim MD by signing hospital release before you go home.   If you experience worsening of your admission symptoms, develop shortness of breath, life threatening emergency, suicidal or homicidal thoughts you must seek medical attention immediately by calling 911 or calling your MD immediately  if symptoms less severe.  You Must read complete instructions/literature along with all the possible adverse reactions/side effects for all the Medicines you take and that have been prescribed to you. Take any new Medicines after you have completely understood and accpet all the possible adverse reactions/side effects.   Do not drive, operating heavy machinery, perform activities at heights, swimming or participation in water activities or provide baby sitting services if your were admitted for syncope or siezures until you have seen by Primary MD or a Neurologist and advised to do so again.  Do not drive when taking Pain medications.    Do not take more than prescribed Pain, Sleep and Anxiety Medications  Special Instructions: If you have smoked or chewed  Tobacco  in the last 2 yrs please stop smoking, stop any regular Alcohol  and or any Recreational drug use.  Wear Seat belts while driving.   Please note  You were cared for by a hospitalist during your hospital stay. If you have any questions about your discharge medications or the care you received while you were in the hospital after you are discharged, you can call the unit and asked to speak with the hospitalist on call if the hospitalist that took care of you is not available. Once you are discharged, your primary care physician will handle any further medical issues. Please note that NO REFILLS for any discharge medications will be authorized once you are discharged, as it is imperative that you return to your primary care physician (or establish a relationship with a primary care physician if you do not have one) for your aftercare needs so that they can reassess your need for medications and monitor your lab values.

## 2013-11-07 NOTE — Progress Notes (Signed)
Upon assessment pt found to have non blanchable areas on sacrum in between buttocks. Pt was cleaned thoroughly after bowel movement and charge nurse was sought for a second opinion. Charge nurse, Mary Martinique Johnson, verified my findings as a stage 1 non blanchable pressure ulcer found in the lower, mid, left region of the sacrum. Barrier cream applied to area and sacral dressing was also placed at that time. Pt is also positioned with a pillow wedge support under his left hip, between knees and heels are floated by a pillow. Pt will continue to be checked, turned, and cleaned every two hours.

## 2013-11-22 ENCOUNTER — Ambulatory Visit: Payer: Self-pay | Admitting: Internal Medicine

## 2013-12-12 ENCOUNTER — Telehealth: Payer: Self-pay | Admitting: Family Medicine

## 2013-12-12 NOTE — Telephone Encounter (Signed)
Caller: [name]/Other; Phone: [phone]; Reason for Call: Urgent Msg for Lattie Haw: Patient is at [facility] in [place].  Daughter calling re condition of patient (does not want to eat or drink, on 80mg  Lasix twice a day, very sleepy, color not good).  Daughter very concerned, and indicated staff at facility not seeming to be alarmed.  Please call to discuss.

## 2013-12-12 NOTE — Telephone Encounter (Signed)
From what she is describing, I recommend she have him seen at the ED.-thx

## 2013-12-12 NOTE — Telephone Encounter (Signed)
Please advise 

## 2013-12-12 NOTE — Telephone Encounter (Signed)
Pt's daughter, [name] will follow PCP advise.

## 2013-12-14 ENCOUNTER — Inpatient Hospital Stay: Payer: Self-pay | Admitting: Internal Medicine

## 2013-12-14 LAB — CBC WITH DIFFERENTIAL/PLATELET
BASOS ABS: 0.1 10*3/uL (ref 0.0–0.1)
BASOS PCT: 1.2 %
EOS ABS: 0.8 10*3/uL — AB (ref 0.0–0.7)
Eosinophil %: 9.5 %
HCT: 35.1 % — ABNORMAL LOW (ref 40.0–52.0)
HGB: 11.8 g/dL — ABNORMAL LOW (ref 13.0–18.0)
LYMPHS ABS: 1.9 10*3/uL (ref 1.0–3.6)
Lymphocyte %: 24.7 %
MCH: 31.7 pg (ref 26.0–34.0)
MCHC: 33.6 g/dL (ref 32.0–36.0)
MCV: 94 fL (ref 80–100)
MONOS PCT: 9.5 %
Monocyte #: 0.8 x10 3/mm (ref 0.2–1.0)
Neutrophil #: 4.3 10*3/uL (ref 1.4–6.5)
Neutrophil %: 55.1 %
PLATELETS: 134 10*3/uL — AB (ref 150–440)
RBC: 3.72 10*6/uL — AB (ref 4.40–5.90)
RDW: 17.9 % — AB (ref 11.5–14.5)
WBC: 7.9 10*3/uL (ref 3.8–10.6)

## 2013-12-14 LAB — URINALYSIS, COMPLETE
Bilirubin,UR: NEGATIVE
GLUCOSE, UR: NEGATIVE mg/dL (ref 0–75)
KETONE: NEGATIVE
Nitrite: NEGATIVE
Ph: 5 (ref 4.5–8.0)
Protein: 30
RBC,UR: 409 /HPF (ref 0–5)
SQUAMOUS EPITHELIAL: NONE SEEN
Specific Gravity: 1.014 (ref 1.003–1.030)
WBC UR: 250 /HPF (ref 0–5)

## 2013-12-14 LAB — COMPREHENSIVE METABOLIC PANEL
ANION GAP: 9 (ref 7–16)
Albumin: 1.6 g/dL — ABNORMAL LOW (ref 3.4–5.0)
Alkaline Phosphatase: 256 U/L — ABNORMAL HIGH
BILIRUBIN TOTAL: 0.9 mg/dL (ref 0.2–1.0)
BUN: 24 mg/dL — AB (ref 7–18)
CALCIUM: 8 mg/dL — AB (ref 8.5–10.1)
Chloride: 110 mmol/L — ABNORMAL HIGH (ref 98–107)
Co2: 29 mmol/L (ref 21–32)
Creatinine: 1.69 mg/dL — ABNORMAL HIGH (ref 0.60–1.30)
EGFR (African American): 41 — ABNORMAL LOW
GFR CALC NON AF AMER: 35 — AB
Glucose: 75 mg/dL (ref 65–99)
Osmolality: 297 (ref 275–301)
Potassium: 3.5 mmol/L (ref 3.5–5.1)
SGOT(AST): 66 U/L — ABNORMAL HIGH (ref 15–37)
SGPT (ALT): 21 U/L
SODIUM: 148 mmol/L — AB (ref 136–145)
TOTAL PROTEIN: 5.7 g/dL — AB (ref 6.4–8.2)

## 2013-12-14 LAB — TROPONIN I: Troponin-I: <0.02 ng/mL

## 2013-12-14 LAB — PRO B NATRIURETIC PEPTIDE: B-Type Natriuretic Peptide: 395 pg/mL (ref 0–450)

## 2013-12-14 LAB — LIPASE, BLOOD: Lipase: 244 U/L (ref 73–393)

## 2013-12-14 LAB — MAGNESIUM: Magnesium: 2.3 mg/dL

## 2013-12-15 LAB — BASIC METABOLIC PANEL
ANION GAP: 7 (ref 7–16)
BUN: 22 mg/dL — AB (ref 7–18)
CO2: 29 mmol/L (ref 21–32)
CREATININE: 1.66 mg/dL — AB (ref 0.60–1.30)
Calcium, Total: 7.6 mg/dL — ABNORMAL LOW (ref 8.5–10.1)
Chloride: 113 mmol/L — ABNORMAL HIGH (ref 98–107)
EGFR (African American): 42 — ABNORMAL LOW
EGFR (Non-African Amer.): 36 — ABNORMAL LOW
Glucose: 98 mg/dL (ref 65–99)
OSMOLALITY: 299 (ref 275–301)
POTASSIUM: 3.4 mmol/L — AB (ref 3.5–5.1)
SODIUM: 149 mmol/L — AB (ref 136–145)

## 2013-12-15 LAB — CBC WITH DIFFERENTIAL/PLATELET
Basophil #: 0.1 10*3/uL (ref 0.0–0.1)
Basophil %: 0.8 %
EOS PCT: 13.8 %
Eosinophil #: 0.9 10*3/uL — ABNORMAL HIGH (ref 0.0–0.7)
HCT: 31.2 % — ABNORMAL LOW (ref 40.0–52.0)
HGB: 10.4 g/dL — ABNORMAL LOW (ref 13.0–18.0)
LYMPHS PCT: 22 %
Lymphocyte #: 1.5 10*3/uL (ref 1.0–3.6)
MCH: 31.5 pg (ref 26.0–34.0)
MCHC: 33.3 g/dL (ref 32.0–36.0)
MCV: 95 fL (ref 80–100)
Monocyte #: 0.5 x10 3/mm (ref 0.2–1.0)
Monocyte %: 8.1 %
Neutrophil #: 3.7 10*3/uL (ref 1.4–6.5)
Neutrophil %: 55.3 %
Platelet: 112 10*3/uL — ABNORMAL LOW (ref 150–440)
RBC: 3.3 10*6/uL — ABNORMAL LOW (ref 4.40–5.90)
RDW: 18 % — AB (ref 11.5–14.5)
WBC: 6.6 10*3/uL (ref 3.8–10.6)

## 2013-12-15 LAB — AMMONIA: Ammonia, Plasma: 172 mcmol/L — ABNORMAL HIGH (ref 11–32)

## 2013-12-15 LAB — SODIUM: SODIUM: 150 mmol/L — AB (ref 136–145)

## 2013-12-16 LAB — BASIC METABOLIC PANEL
Anion Gap: 7 (ref 7–16)
BUN: 19 mg/dL — ABNORMAL HIGH (ref 7–18)
Calcium, Total: 7.5 mg/dL — ABNORMAL LOW (ref 8.5–10.1)
Chloride: 111 mmol/L — ABNORMAL HIGH (ref 98–107)
Co2: 29 mmol/L (ref 21–32)
Creatinine: 1.44 mg/dL — ABNORMAL HIGH (ref 0.60–1.30)
EGFR (African American): 50 — ABNORMAL LOW
EGFR (Non-African Amer.): 43 — ABNORMAL LOW
Glucose: 87 mg/dL (ref 65–99)
OSMOLALITY: 294 (ref 275–301)
Potassium: 3.1 mmol/L — ABNORMAL LOW (ref 3.5–5.1)
SODIUM: 147 mmol/L — AB (ref 136–145)

## 2013-12-16 LAB — AMMONIA: AMMONIA, PLASMA: 156 umol/L — AB (ref 11–32)

## 2013-12-16 LAB — URINE CULTURE

## 2013-12-17 LAB — BASIC METABOLIC PANEL
ANION GAP: 8 (ref 7–16)
BUN: 17 mg/dL (ref 7–18)
CALCIUM: 7.6 mg/dL — AB (ref 8.5–10.1)
CO2: 28 mmol/L (ref 21–32)
CREATININE: 1.38 mg/dL — AB (ref 0.60–1.30)
Chloride: 108 mmol/L — ABNORMAL HIGH (ref 98–107)
EGFR (African American): 53 — ABNORMAL LOW
EGFR (Non-African Amer.): 45 — ABNORMAL LOW
Glucose: 79 mg/dL (ref 65–99)
Osmolality: 287 (ref 275–301)
Potassium: 3.2 mmol/L — ABNORMAL LOW (ref 3.5–5.1)
SODIUM: 144 mmol/L (ref 136–145)

## 2013-12-17 LAB — AMMONIA: Ammonia, Plasma: 98 mcmol/L — ABNORMAL HIGH (ref 11–32)

## 2013-12-18 LAB — CBC WITH DIFFERENTIAL/PLATELET
BASOS ABS: 0.1 10*3/uL (ref 0.0–0.1)
BASOS PCT: 1.3 %
EOS ABS: 1 10*3/uL — AB (ref 0.0–0.7)
EOS PCT: 12 %
HCT: 31.5 % — ABNORMAL LOW (ref 40.0–52.0)
HGB: 10.3 g/dL — ABNORMAL LOW (ref 13.0–18.0)
Lymphocyte #: 2.9 10*3/uL (ref 1.0–3.6)
Lymphocyte %: 33.8 %
MCH: 31 pg (ref 26.0–34.0)
MCHC: 32.6 g/dL (ref 32.0–36.0)
MCV: 95 fL (ref 80–100)
MONOS PCT: 9.3 %
Monocyte #: 0.8 x10 3/mm (ref 0.2–1.0)
NEUTROS ABS: 3.7 10*3/uL (ref 1.4–6.5)
Neutrophil %: 43.6 %
PLATELETS: 119 10*3/uL — AB (ref 150–440)
RBC: 3.31 10*6/uL — AB (ref 4.40–5.90)
RDW: 17.2 % — ABNORMAL HIGH (ref 11.5–14.5)
WBC: 8.5 10*3/uL (ref 3.8–10.6)

## 2013-12-18 LAB — BASIC METABOLIC PANEL
ANION GAP: 10 (ref 7–16)
BUN: 15 mg/dL (ref 7–18)
CALCIUM: 7.7 mg/dL — AB (ref 8.5–10.1)
Chloride: 106 mmol/L (ref 98–107)
Co2: 27 mmol/L (ref 21–32)
Creatinine: 1.39 mg/dL — ABNORMAL HIGH (ref 0.60–1.30)
EGFR (African American): 52 — ABNORMAL LOW
EGFR (Non-African Amer.): 45 — ABNORMAL LOW
GLUCOSE: 96 mg/dL (ref 65–99)
Osmolality: 286 (ref 275–301)
Potassium: 3.1 mmol/L — ABNORMAL LOW (ref 3.5–5.1)
Sodium: 143 mmol/L (ref 136–145)

## 2013-12-18 LAB — AMMONIA: Ammonia, Plasma: 168 mcmol/L — ABNORMAL HIGH (ref 11–32)

## 2013-12-18 LAB — PROTIME-INR
INR: 1.5
PROTHROMBIN TIME: 18 s — AB (ref 11.5–14.7)

## 2013-12-19 LAB — BASIC METABOLIC PANEL
Anion Gap: 9 (ref 7–16)
BUN: 14 mg/dL (ref 7–18)
Calcium, Total: 8 mg/dL — ABNORMAL LOW (ref 8.5–10.1)
Chloride: 105 mmol/L (ref 98–107)
Co2: 26 mmol/L (ref 21–32)
Creatinine: 1.49 mg/dL — ABNORMAL HIGH (ref 0.60–1.30)
EGFR (African American): 48 — ABNORMAL LOW
EGFR (Non-African Amer.): 41 — ABNORMAL LOW
Glucose: 96 mg/dL (ref 65–99)
Osmolality: 280 (ref 275–301)
Potassium: 3.2 mmol/L — ABNORMAL LOW (ref 3.5–5.1)
SODIUM: 140 mmol/L (ref 136–145)

## 2013-12-19 LAB — CULTURE, BLOOD (SINGLE)

## 2013-12-20 LAB — AMMONIA: Ammonia, Plasma: 39 mcmol/L — ABNORMAL HIGH (ref 11–32)

## 2013-12-21 LAB — CBC WITH DIFFERENTIAL/PLATELET
BASOS PCT: 0.7 %
Basophil #: 0.1 10*3/uL (ref 0.0–0.1)
EOS PCT: 11.1 %
Eosinophil #: 1 10*3/uL — ABNORMAL HIGH (ref 0.0–0.7)
HCT: 31.6 % — ABNORMAL LOW (ref 40.0–52.0)
HGB: 10.4 g/dL — ABNORMAL LOW (ref 13.0–18.0)
LYMPHS ABS: 2.3 10*3/uL (ref 1.0–3.6)
Lymphocyte %: 26.7 %
MCH: 31.4 pg (ref 26.0–34.0)
MCHC: 33 g/dL (ref 32.0–36.0)
MCV: 95 fL (ref 80–100)
MONO ABS: 0.9 x10 3/mm (ref 0.2–1.0)
Monocyte %: 10 %
NEUTROS ABS: 4.5 10*3/uL (ref 1.4–6.5)
Neutrophil %: 51.5 %
PLATELETS: 121 10*3/uL — AB (ref 150–440)
RBC: 3.32 10*6/uL — ABNORMAL LOW (ref 4.40–5.90)
RDW: 17.6 % — ABNORMAL HIGH (ref 11.5–14.5)
WBC: 8.7 10*3/uL (ref 3.8–10.6)

## 2013-12-21 LAB — BASIC METABOLIC PANEL
ANION GAP: 7 (ref 7–16)
BUN: 13 mg/dL (ref 7–18)
CHLORIDE: 106 mmol/L (ref 98–107)
CO2: 27 mmol/L (ref 21–32)
Calcium, Total: 7.6 mg/dL — ABNORMAL LOW (ref 8.5–10.1)
Creatinine: 1.55 mg/dL — ABNORMAL HIGH (ref 0.60–1.30)
EGFR (African American): 46 — ABNORMAL LOW
GFR CALC NON AF AMER: 39 — AB
GLUCOSE: 66 mg/dL (ref 65–99)
OSMOLALITY: 278 (ref 275–301)
Potassium: 3.5 mmol/L (ref 3.5–5.1)
Sodium: 140 mmol/L (ref 136–145)

## 2013-12-21 LAB — HEPATIC FUNCTION PANEL A (ARMC)
ALK PHOS: 201 U/L — AB
ALT: 25 U/L
AST: 72 U/L — AB (ref 15–37)
Albumin: 1.3 g/dL — ABNORMAL LOW (ref 3.4–5.0)
BILIRUBIN TOTAL: 0.8 mg/dL (ref 0.2–1.0)
Bilirubin, Direct: 0.3 mg/dL — ABNORMAL HIGH (ref 0.00–0.20)
TOTAL PROTEIN: 5.4 g/dL — AB (ref 6.4–8.2)

## 2013-12-21 LAB — AMMONIA: Ammonia, Plasma: 48 mcmol/L — ABNORMAL HIGH (ref 11–32)

## 2013-12-22 LAB — BASIC METABOLIC PANEL
ANION GAP: 6 — AB (ref 7–16)
BUN: 17 mg/dL (ref 7–18)
CO2: 27 mmol/L (ref 21–32)
CREATININE: 1.48 mg/dL — AB (ref 0.60–1.30)
Calcium, Total: 8.2 mg/dL — ABNORMAL LOW (ref 8.5–10.1)
Chloride: 102 mmol/L (ref 98–107)
EGFR (African American): 48 — ABNORMAL LOW
GFR CALC NON AF AMER: 42 — AB
GLUCOSE: 73 mg/dL (ref 65–99)
Osmolality: 270 (ref 275–301)
POTASSIUM: 3.7 mmol/L (ref 3.5–5.1)
Sodium: 135 mmol/L — ABNORMAL LOW (ref 136–145)

## 2013-12-22 LAB — AMMONIA: AMMONIA, PLASMA: 68 umol/L — AB (ref 11–32)

## 2013-12-22 LAB — CULTURE, BLOOD (SINGLE)

## 2013-12-23 ENCOUNTER — Ambulatory Visit: Payer: Self-pay | Admitting: Internal Medicine

## 2013-12-23 LAB — URINALYSIS, COMPLETE
BILIRUBIN, UR: NEGATIVE
Bacteria: NEGATIVE
Glucose,UR: NEGATIVE mg/dL (ref 0–75)
Ketone: NEGATIVE
Nitrite: NEGATIVE
Ph: 5 (ref 4.5–8.0)
SPECIFIC GRAVITY: 1.015 (ref 1.003–1.030)

## 2013-12-24 LAB — AMMONIA: Ammonia, Plasma: 128 mcmol/L — ABNORMAL HIGH (ref 11–32)

## 2013-12-25 ENCOUNTER — Inpatient Hospital Stay: Payer: Self-pay | Admitting: Internal Medicine

## 2014-01-06 ENCOUNTER — Telehealth: Payer: Self-pay | Admitting: Family Medicine

## 2014-01-06 NOTE — Telephone Encounter (Signed)
Patient passed away Sat 12/26/2013. Merle wanted you to know they recognized your's & Lisa care in his obituary. I cut & paste this from the [facility], "The family would like to thank Dr. Julien Nordmann and his assistant, Lattie Haw, at [facility] [place] whom we feel put the welfare of their patients first

## 2014-01-07 NOTE — Telephone Encounter (Signed)
Very nice. thx

## 2014-01-22 ENCOUNTER — Ambulatory Visit: Payer: Self-pay | Admitting: Internal Medicine

## 2014-01-22 DEATH — deceased

## 2014-08-15 NOTE — H&P (Signed)
   History of Present Illness [name] is an 81 yo man with multiple medical problems including cirrhosis, h/o hepatic encephalopathy, recurrent ascites, hypothyroidism, AOCD, HTN, depression, anxiety, GERD, h/o CHF (unknown type)), afib, s/p knee sgy. Pt was admitted on 8/23 with AMS and found to have hepatic encephalopathy. He is s/p therapeutic paracentesis and had 7L removed. Pt has declined. He had recurrent hyperammonemia despite aggressive tx. Family opted to transition him to comfort care and admit him under GIP hospice until a bed at the hospice home is available.   Code Status DNR   Past Med/Surgical Hx:  chronic UTI:   Hypothyroidism:   constipation:   Anemia:   Depression:   Anxiety:   HTN:   GERD - Esophageal Reflux:   CHF:   Atrial Fibrillation:   Cirrhosis:   Knee Surgery - Left:   Knee Surgery - Right:   ALLERGIES:  Amitriptyline: Unknown  Niacin: Unknown  Imdur: Unknown  Beta Blockers: Unknown  Sulfa drugs: Unknown  HOME MEDICATIONS: Medication Instructions Status  glycopyrrolate 0.2 milligram(s) injectable every 4 hours, As needed, secretions Active  LORazepam 0.5 milligram(s) injectable every hour, As needed, for signs of discomfort Active  morphine 2 milligram(s) injectable every 30 minutes, As needed, comfort care pain/dyspnea Active   Family and Social History:  Family History Non-Contributory   Review of Systems:  ROS Pt not able to provide ROS   Medications/Allergies Reviewed Medications/Allergies reviewed   Physical Exam:  GEN critically ill appearing   RESP normal resp effort  ant fields   ABD distended  + fluid wave   EXTR positive edema   NEURO poorly responsive   PSYCH lethargic    Assessment/Admission Diagnosis Cirrhosis with recurrent hepatic encephalopathy and ascites. General decline. Pt is on comfort care and being admitted for GIP hospice. Plan would be to transition him to the [facility] when a bed becomes available if  stable.   Plan Comfort Care GIP Hospice Morphine and lorazepam prn for comfort Emotional support for family   Electronic Signatures: Borders, Kirt Boys (NP)  (Signed 03-Sep-15 11:01)  Authored: CHIEF COMPLAINT and HISTORY, PAST MEDICAL/SURGIAL HISTORY, ALLERGIES, HOME MEDICATIONS, FAMILY AND SOCIAL HISTORY, REVIEW OF SYSTEMS, PHYSICAL EXAM, ASSESSMENT AND PLAN   Last Updated: 03-Sep-15 11:01 by Irean Hong (NP)

## 2014-08-15 NOTE — H&P (Signed)
PATIENT NAME:  ALTER, MOSS MR#:  [id number] DATE OF BIRTH:  1922/08/16  DATE OF ADMISSION:  12/14/2013  PRIMARY CARE PHYSICIAN: Jerrell Belfast, MD   REFERRING PHYSICIAN: Yetta Numbers. Karma Greaser, MD   CHIEF COMPLAINT: Altered mental status.   HISTORY OF PRESENT ILLNESS: Mr. Klemens is an 81 year old male, who was well functional prior to November 2014, when the patient developed pneumonia, hospitalized. Subsequently, the patient went to skilled nursing facility. At the skilled nursing facility, the patient had a fall and sustained a hip fracture. Since then, the patient has been living in an assisted living facility with significant decline. The patient has a history of cirrhosis from previous history of alcohol use as well as extensive acetaminophen use. Had in the past 1 episode of hepatic encephalopathy and ascites status post therapeutic paracentesis. The patient has been gradually getting worse with generalized weakness. The patient refused to come to the Emergency Department. Today, the patient's son-in-law found him completely altered mental status, unresponsive. Concerning this, the patient is brought to the Emergency Department from [facility]. Per the patient's daughter, the patient continues to take all his medications. The patient is on mirtazapine, meclizine, lorazepam, Norco. The patient is also found to have urinary tract infection. The patient's family states that the patient did not have any cough; however, has coarse breath sounds bilaterally. The patient is a DO NOT RESUSCITATE and DO NOT INTUBATE. The patient received Rocephin in the Emergency Department. CT head without contrast shows chronic microvascular changes.   PAST MEDICAL HISTORY:  1. Hypothyroidism.  2. Cirrhosis.  3. Depression/anxiety,  4. Anemia of chronic disease.  5. Hypertension.  6. Gastroesophageal reflux disease.   7. Congestive heart failure.  8. Atrial fibrillation.   PAST SURGICAL HISTORY: Bilateral knee  surgeries.  ALLERGIES:  1. AMITRIPTYLINE.  2. 3. IMDUR.  4. BETA BLOCKER.  5. SULFA DRUGS.   HOME MEDICATIONS: 1. Vitamin B12 at 1000 mcg once a day. 2. Spironolactone 50 mg once a day.  3. Potassium chloride 10 mEq once a day.  4. Protonix 40 mg once a day.  5. Nitroglycerin 0.4 mg sublingual every 5 minutes as needed.  6. Mirtazapine 30 mg once a day.  7. Metoprolol 12.5 mg 2 times a day.  8. Meclizine 25 mg every 4 hours as needed.  9. Lorazepam 0.5 mg at bedtime.  10. Levothyroxine 100 mcg once a day.  11. Levalbuterol inhalers as needed.  12. Lactulose 30 mL 3 times a day.  13. Guaifenesin every 4 hours as needed.  14. Lasix 80 mg 2 times a day.  15. Flomax 0.4 mg once a day.  16. Finasteride 5 mg once a day.  17. Aspirin 81 mg once a day.  18. Norco 5/325 mg every 4 hours as needed.   SOCIAL HISTORY: Previous history of smoking, previous history of alcohol use. Married. His wife is currently living in an Alzheimer's unit. Prior to this, the patient was independent of some ADLs. The patient had good support of the family.   FAMILY HISTORY: Could not be obtained from the patient, as patient is in currently altered mental status.   PHYSICAL EXAMINATION: GENERAL: This is a thin, cachectic-looking male, looks appropriate for stated age, lying down in the bed, not in distress.  VITAL SIGNS: Temperature 98., pulse 82, blood pressure 115/64, respiratory rate of 12, oxygen saturation is 98% on room air.  HEENT: Head normocephalic, atraumatic. There is no scleral icterus. Conjunctivae normal. Pupils equal and react to  light. Could not examine the mucous membranes or oropharynx.  NECK: Supple. No lymphadenopathy. No JVD. No carotid bruit. No thyromegaly.  CHEST: No focal tenderness. Bilateral coarse breath sounds. Decreased breath sounds in the lower lobes.  HEART: S1, S2, regular . No murmurs are heard.  ABDOMEN: Distended abdomen. Fluid shift plus. Nontender. Could not appreciate  any hepatosplenomegaly.  EXTREMITIES: 2+ to 3+ pitting edema extending up to the thighs; seems to be right greater than left.  NEUROLOGIC: The patient is not oriented to place, person, and time. Does not respond to any painful stimuli. Could not get examine the motor, sensory or cranial nerves.   LABORATORY DATA: CBC: WBC 7.9, hemoglobin 11.8, platelet count of 134,000. CMP: BUN 24, creatinine of 1.69, alkaline phosphatase of 256, AST 66, albumin 1.6. Urinalysis: 3+ leukocyte esterase, bacteria 1+, WBC of over 250. CT head without contrast: Chronic microvascular changes.    ASSESSMENT AND PLAN: Mr. Moist is an 81 year old male who was brought to the Emergency Department in complete altered mental status.  1. Altered mental status. This seems to be a combination of factors: Medication, possible hepatic encephalopathy, underlying infection. Hold all sedative medications. Treat urinary tract infection and possible pneumonia. Will also check the ammonia levels and follow up.  2. Urinary tract infection. Follow up with urine cultures. Keep the patient on Rocephin.  3. Pneumonia. The patient has bilateral coarse breath sounds. Chest x-ray: Atelectasis. Keep the patient on Rocephin and azithromycin.  4. Cirrhosis. The patient has ascites. Does not seem to have any tenderness. Will continue to follow up. The patient has hypoalbumin of 1.6. This could be a combination of both underlying cirrhosis as well as poor nutrition. Will also check the prealbumin level. 5. Hepatic encephalopathy. Discussed with the patient's daughter. She is agreeable to placing a nasogastric tube and giving lactulose.  6. Hypertension, well controlled. Continue with metoprolol.  7. Keep the patient on deep vein thrombosis prophylaxis with Lovenox.   TIME SPENT: 60 minutes.     ____________________________ Monica Becton, MD pv:lm D: 12/15/2013 00:27:43 ET T: 12/15/2013 04:58:31 ET JOB#: [id number]  cc: Monica Becton, MD,  <Dictator> Jerrell Belfast, MD Monica Becton MD ELECTRONICALLY SIGNED 12/17/2013 0:20

## 2014-08-15 NOTE — Discharge Summary (Signed)
PATIENT NAME:  Raymond Larsen, Raymond Larsen MR#:  [id number] DATE OF BIRTH:  1922-07-25  DATE OF ADMISSION:  12/14/2013 DATE OF DISCHARGE:  12/23/2013    ADMITTING PHYSICIAN: Monica Becton, MD  DISCHARGING PHYSICIAN: Gladstone Lighter, MD  PRIMARY CARE PHYSICIAN: Jerrell Belfast, MD  [facility]: Palliative care consultation by Efraim Kaufmann, MD   DISCHARGE DIAGNOSES:  1.  Hepatic encephalopathy.  2.  Chronic kidney disease stage III.  3.  Anasarca.  4.  Ascites, status post 7 L paracentesis of ascitic fluid; 7 L of ascitic fluid taken out by paracentesis.  5.  Liver cirrhosis, alcohol-induced and use of Tylenol in the past.  6.  Hypoalbuminemia. Bedbound status, status post chronic Foley catheter.  7.  Chronic obstructive pulmonary disease. 8.  Depression and anxiety.  9.  Anemia of chronic disease.  10.  Congestive heart failure.   DISCHARGE HOME MEDICATIONS: 1.  Meclizine 25 mg p.o. every 4 hours p.r.n. for nausea and vomiting.  2.  Aldactone 50 mg p.o. daily.  3.  Potassium chloride 10 mEq p.o. daily.  4.  Flomax 0.4 mg p.o. daily.  5.  Vitamin B12 1000 mcg p.o. daily.  6.  Levothyroxine 100 mcg p.o. daily.  7.  Finasteride 5 mg p.o. daily.  8.  Aspirin 81 mg p.o. at bedtime.  9.  Protonix 40 mg p.o. at bedtime.  10.  Metoprolol 12.5 mg p.o. b.i.d.  11.  Sublingual nitroglycerin 0.4 mg p.r.n. for chest pain.  12.  Levalbuterol 3 mL via nebulizer q. 6 hours p.r.n. for shortness of breath.  13.  Guaifenesin thymol every 6 hours p.r.n. for cough.  14.  Lorazepam 0.5 mg p.o. daily at bedtime, p.r.n. for anxiety.  15.  Lasix 40 mg p.o. daily.  16.  Lactulose 30 mL every 6 hours.  17.  Rifaximin 550 mg p.o. b.i.d.   DISCHARGE DIET: Regular diet, puree with thin liquids and aspiration precautions.   DISCHARGE ACTIVITY: As tolerated.   FOLLOWUP INSTRUCTIONS:  1.  PCP followup in one week.  2.  Hospice screen and followup at the nursing facility.   LABORATORY AND  IMAGING STUDIES PRIOR TO DISCHARGE: Urinalysis showing 3+ leukocyte esterase, 15-30 WBCs, no bacteria; however, the patient has a chronic Foley catheter.  Sodium 135, potassium 3.7, chloride 102, bicarbonate 27, BUN 17, creatinine 1.4, glucose 73 and calcium of 8.2. Ammonia is 68. WBC 8.7, hemoglobin 10.4, hematocrit 31.6, platelet count 121,000. Ultrasound guided paracentesis done 12/19/2013 revealing 7 L clear peritoneal fluid taken out. Chest x-ray on admission showing clear lung fields. No edema or consolidation.   MICROBIOLOGY: Positive coagulase-negative Staphylococcus in the blood on 12/14/2013. Second blood culture negative. Urine cultures growing mixed organisms.   BRIEF HOSPITAL COURSE: [name] is an 81 year old elderly Caucasian male with a past medical history significant for liver cirrhosis, congestive heart failure, anemia of chronic disease, depression and anxiety, who is brought from [facility] to change in mental status and noted to have an ammonia greater than 100.   1.  Hepatic encephalopathy. Started on lactulose and also Xifaxan and initially had to be given rectally, prior to  improvement in his mental status, and now able to take orally. Ammonia is still fluctuating between 30-60 range at this time; however, his mental status has been back to baseline, so no further adjustments are being made.  2.  Liver cirrhosis. Prior history of alcohol use and Tylenol use in the past. The patient has  a very poor prognosis. He has hypoalbuminemia, significant anasarca, ascites; 7 L were drained this admission from his abdomen. He was started on Lasix Aldactone, potassium supplements and also on metoprolol. He has extremely poor prognosis. He has scrotal edema, bedbound status, with a chronic Foley catheter. Urinalysis appears intact. It was actually contamination. Cultures were negative. He was seen by the palliative care team for the same, who  recommended hospice at SNF and the family was agreeable.  3.  Coagulase-negative Staphylococcus bacteremia, likely contamination. Initially on broad-spectrum antibiotics with vancomycin and Rocephin. Antibiotics were discontinued once cultures came back negative.  4.  Benign prostatic hypertrophy. Patient on Flomax and finasteride.  5.  His course has been otherwise uneventful in the hospital.   DISCHARGE CONDITION: Guarded, with poor long-term prognosis.   DISCHARGE DISPOSITION: To skilled nursing facility with hospice.   TIME SPENT ON DISCHARGE: 45 minutes.   CODE STATUS: Do Not Resuscitate.    ____________________________ Gladstone Lighter, MD rk:MT D: 12/23/2013 14:06:42 ET T: 12/23/2013 14:37:13 ET JOB#: [id number]  cc: Gladstone Lighter, MD, <Dictator> Gladstone Lighter MD ELECTRONICALLY SIGNED 12/25/2013 14:08

## 2014-08-15 NOTE — Discharge Summary (Signed)
PATIENT NAME:  Raymond Larsen, Raymond Larsen MR#:  [id number] DATE OF BIRTH:  July 15, 1922  DATE OF ADMISSION:  12/14/2013 DATE OF DISCHARGE:  12/25/2013  ADDENDUM:  ADMITTING PHYSICIAN: Monica Becton, MD   PRIMARY CARE PHYSICIAN: Margarita Rana, MD  [facility]: Palliative care consultation by Efraim Kaufmann, MD  NOTE: For more details, please look at the discharge summary dictated by Dr. Tressia Miners on 12/23/2013. Discharge diagnoses remain the same. The patient is being discharged today to inpatient hospice. There has been no significant change in his condition. There are no additional imaging studies. We will continue to follow him throughout his inpatient stay.   ADDITIONAL TIME SPENT: Twenty 20 minutes.   ____________________________ Earleen Newport. Volanda Napoleon, MD cpw:TT D: 12/25/2013 14:57:32 ET T: 12/25/2013 21:07:03 ET JOB#: [id number]  cc: Earleen Newport. Volanda Napoleon, MD, <Dictator> Aldean Jewett MD ELECTRONICALLY SIGNED 12/26/2013 13:21

## 2014-08-15 NOTE — Discharge Summary (Signed)
PATIENT NAME:  Raymond Larsen, Raymond Larsen MR#:  [id number] DATE OF BIRTH:  08/27/1922  DATE OF ADMISSION:  12/14/2013 DATE OF DISCHARGE:  12/24/2013  ADDENDUM  ADMITTING PHYSICIAN: Monica Becton, MD  PRIMARY CARE PHYSICIAN: Jerrell Belfast, MD  [facility]: Palliative care consultation by Dr. Izora Gala Phifer.  For more details, please look at the discharge summary dictated by Dr. Tressia Miners on 12/23/2013. The discharge diagnoses remain the same, and the patient also developed hematuria in the meanwhile. His ammonia was elevated up to 128. The patient became more lethargic at this point, unable to take p.o. medications. His lactulose has been changed over to rectal. He is not appropriate to be discharged to rehabilitation. Because this is a chronic ongoing problem, discussion was done with the family, stating that the patient has a poor long-term prognosis, and they have agreed for hospice at this time. Patient unable to be transferred to hospice home because of nonavailability of beds, and he will be transitioned to inpatient hospice at this time.   ADDITIONAL TIME SPENT: 35 minutes.    ____________________________ Gladstone Lighter, MD rk:ST D: 12/24/2013 15:14:00 ET T: 12/24/2013 23:18:36 ET JOB#: [id number]  cc: Gladstone Lighter, MD, <Dictator> Gladstone Lighter MD ELECTRONICALLY SIGNED 12/25/2013 14:09

## 2014-08-15 NOTE — Discharge Summary (Signed)
PATIENT NAME:  Raymond Larsen, Raymond Larsen MR#:  [id number] DATE OF BIRTH:  22-Oct-1922  DATE OF ADMISSION:  12/25/2013  DATE OF DISCHARGE:  12/27/2013   Date of initial admission was 12/14/2013 until 12/25/2013.  Admitted in inpatient hospice 12/25/2013 and discharged from inpatient hospice to hospice home on 12/27/2013.    ADMITTING PHYSICIAN:  Monica Becton, MD.  DISCHARGING PHYSICIAN:  Myrtis Ser, MD.   PRIMARY CARE PHYSICIAN:  Margarita Rana, MD   CONSULTATIONS: Palliative care, Efraim Kaufmann, MD.   DISCHARGE DIAGNOSES:  1.  Hepatic encephalopathy.  2.  Chronic kidney disease stage III.  3.  Anasarca.  4.  Ascites status post 7 liters paracentesis of acetic fluid.  5.  Liver cirrhosis, alcohol induced with concomitant use of Tylenol.  6.  Hypoalbuminemia.   7.  Chronic obstructive pulmonary disease.  8.  Depression and anxiety.  9.  Anemia of chronic disease.  10. Congestive heart failure.   DISCHARGE MEDICATIONS:  No scheduled medications. 1. Acetaminophen suppository 650 mg rectal every 4 hours p.r.n. as needed for mild pain or temperature.  2.  Glycopyrrolate injection 0.2 mg IV push every 4 hours as needed for secretions.  3.  Lorazepam 0.5 to 1 mg IV push every hour for signs of discomfort.  4.  Morphine injection 2 to 4 mg IV push every 30 minutes for comfort care, pain, dyspnea.   PROCEDURES: None.   HISTORY OF PRESENT ILLNESS AND HOSPITAL COURSE:   [name] was initially admitted to the hospital on 12/14/2013 with altered mental status, which was multifactorial due to hepatic encephalopathy, urinary tract infection.  He was treated with lactulose and Xifaxan initially and had minimal improvement in mental status.  He had a paracentesis removing 7 liter of ascitic fluid from his abdomen and was started on Lasix and Aldactone.  This helped to relieve abdominal pain and pressure. The patient did have a coagulase-negative Staphylococcus bacteremia, which was likely a contaminant  treated initially with broad-spectrum antibiotics, which were discontinued when further cultures returned negative.  After the patient's condition continued to worsen despite maximal medical therapy, the family opted to transition to comfort care and he was admitted under general inpatient hospice to await bed hospice home.   He is currently being discharged with comfort care to hospice home.    PHYSICAL EXAMINATION: GENERAL:  The patient is critically ill-appearing.  Dry oral mucosa.  Normal respiratory effort, no use of accessory muscles. LUNGS:  Clear. Respirations are short and shallow.  CARDIOVASCULAR: Regular rate and rhythm, no murmurs, rubs, or gallops.  ABDOMEN: Soft, hypoactive bowel sounds.  EXTREMITIES: Positive edema.  SKIN: Normal to palpation.  PSYCHIATRIC: The patient is arousable to voice, he is not in pain, otherwise very sleepy.   DISCHARGE INSTRUCTIONS: The patient is being discharged to hospice home for comfort care.    time spent on discharge 30 minutes   ____________________________ Earleen Newport. Volanda Napoleon, MD cpw:DT D: 12/27/2013 09:13:05 ET T: 12/27/2013 09:40:14 ET JOB#: [id number]  cc: Barnetta Chapel P. Volanda Napoleon, MD, <Dictator> Aldean Jewett MD ELECTRONICALLY SIGNED 12/27/2013 15:07

## 2014-11-07 IMAGING — CR DG HIP COMPLETE 2+V*R*
3 series · 3 of 3 positions shown · non-contrast
Comparison: 09/03/2013

CLINICAL DATA: Right hip pain.  Fall 1 week ago.

EXAM:
RIGHT HIP - COMPLETE 2+ VIEW

[t pelvis ap]
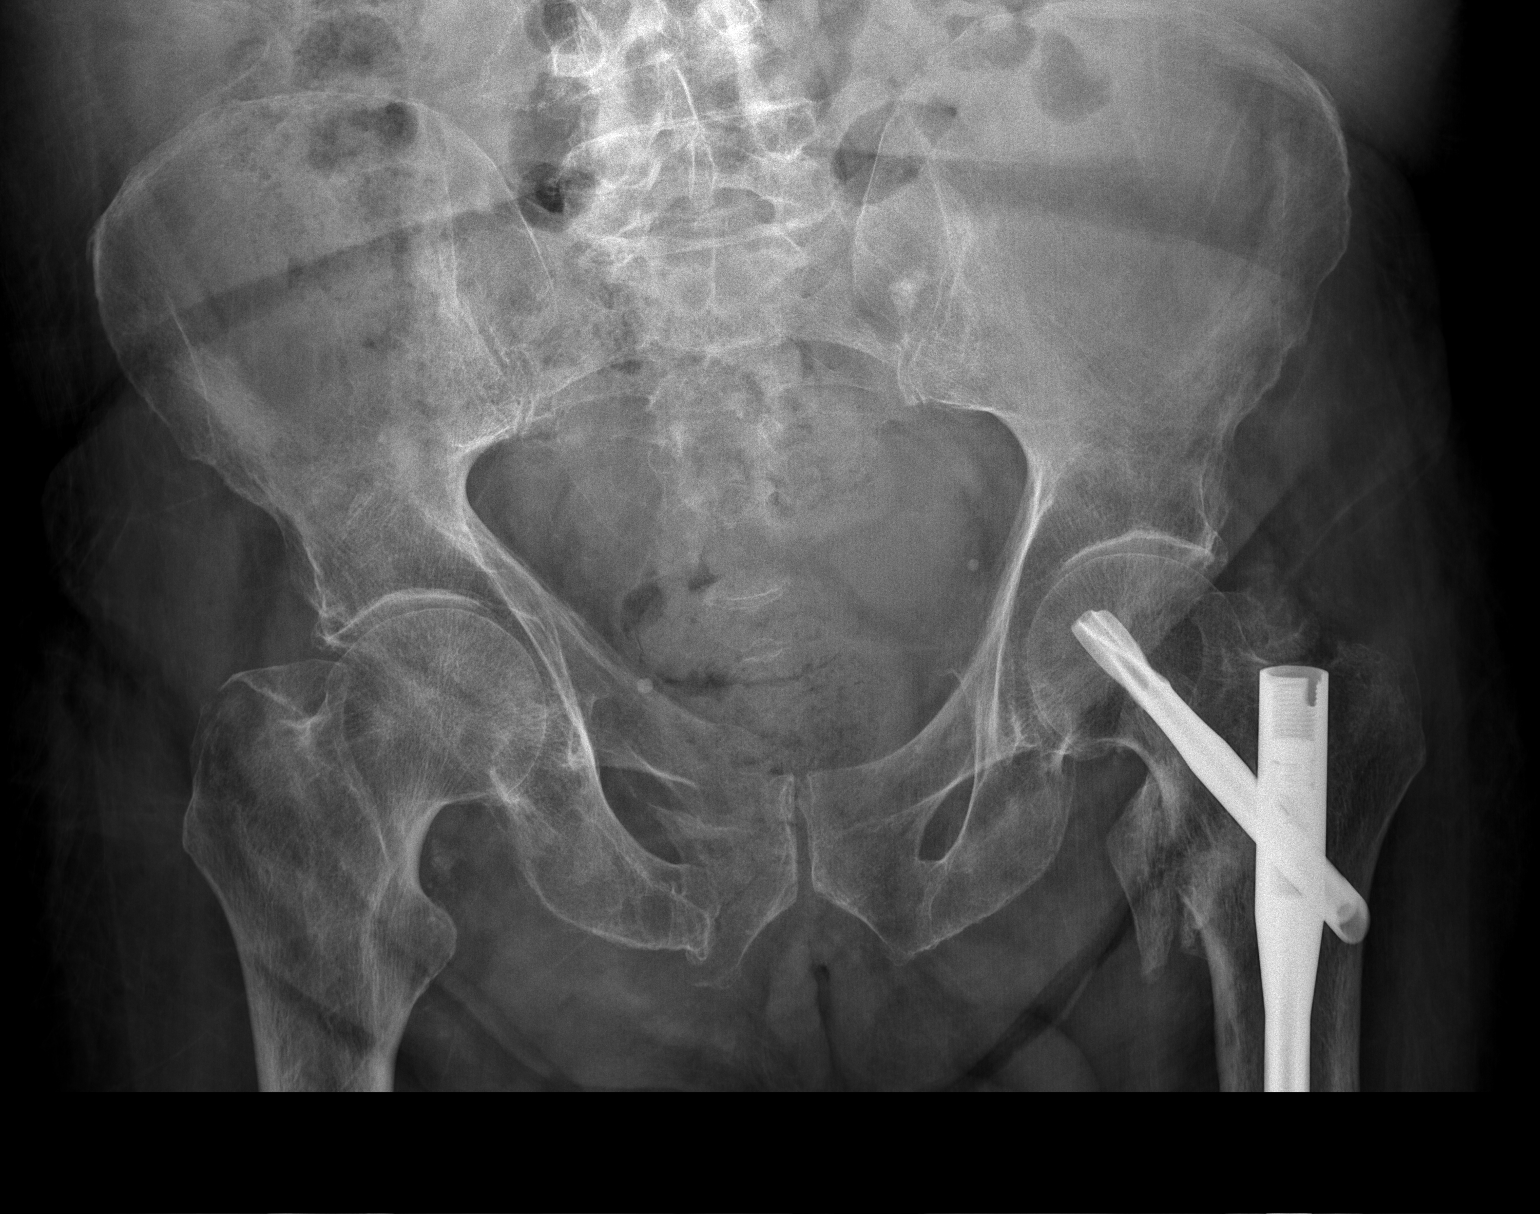

[t hip ap right]
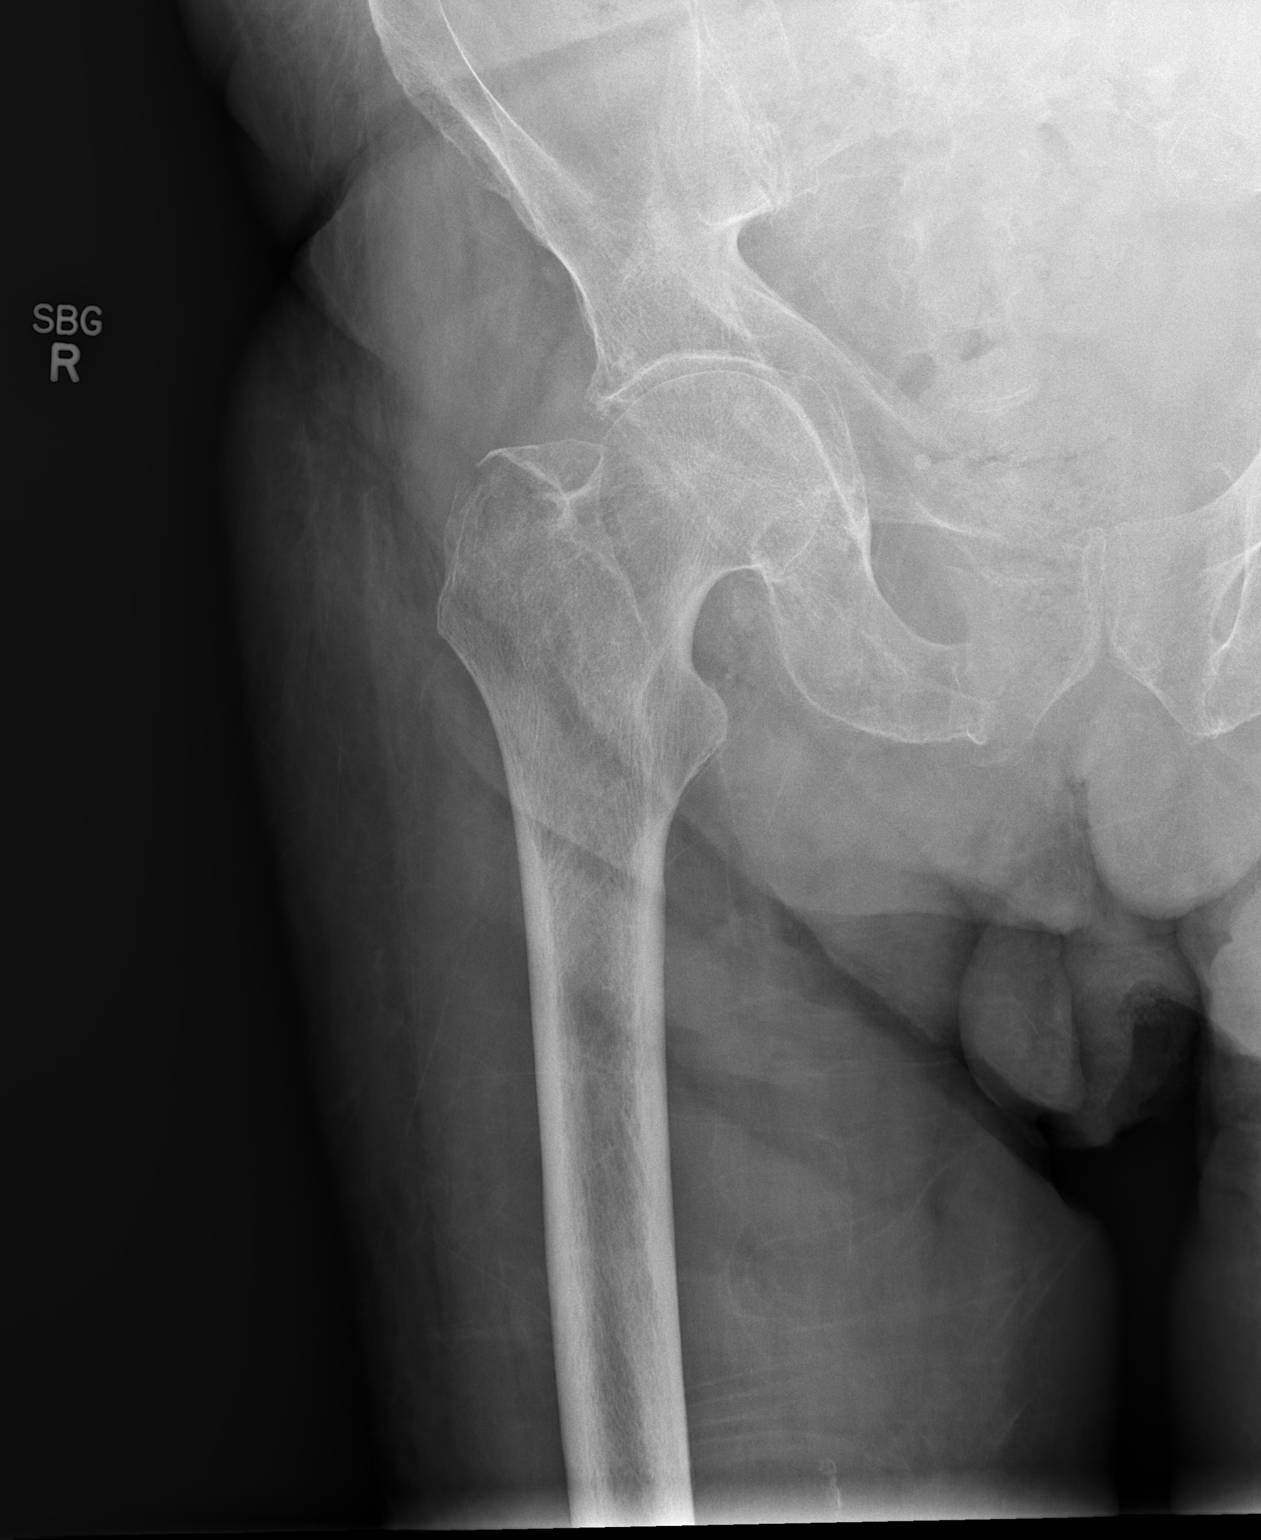

[t hip frog leg right]
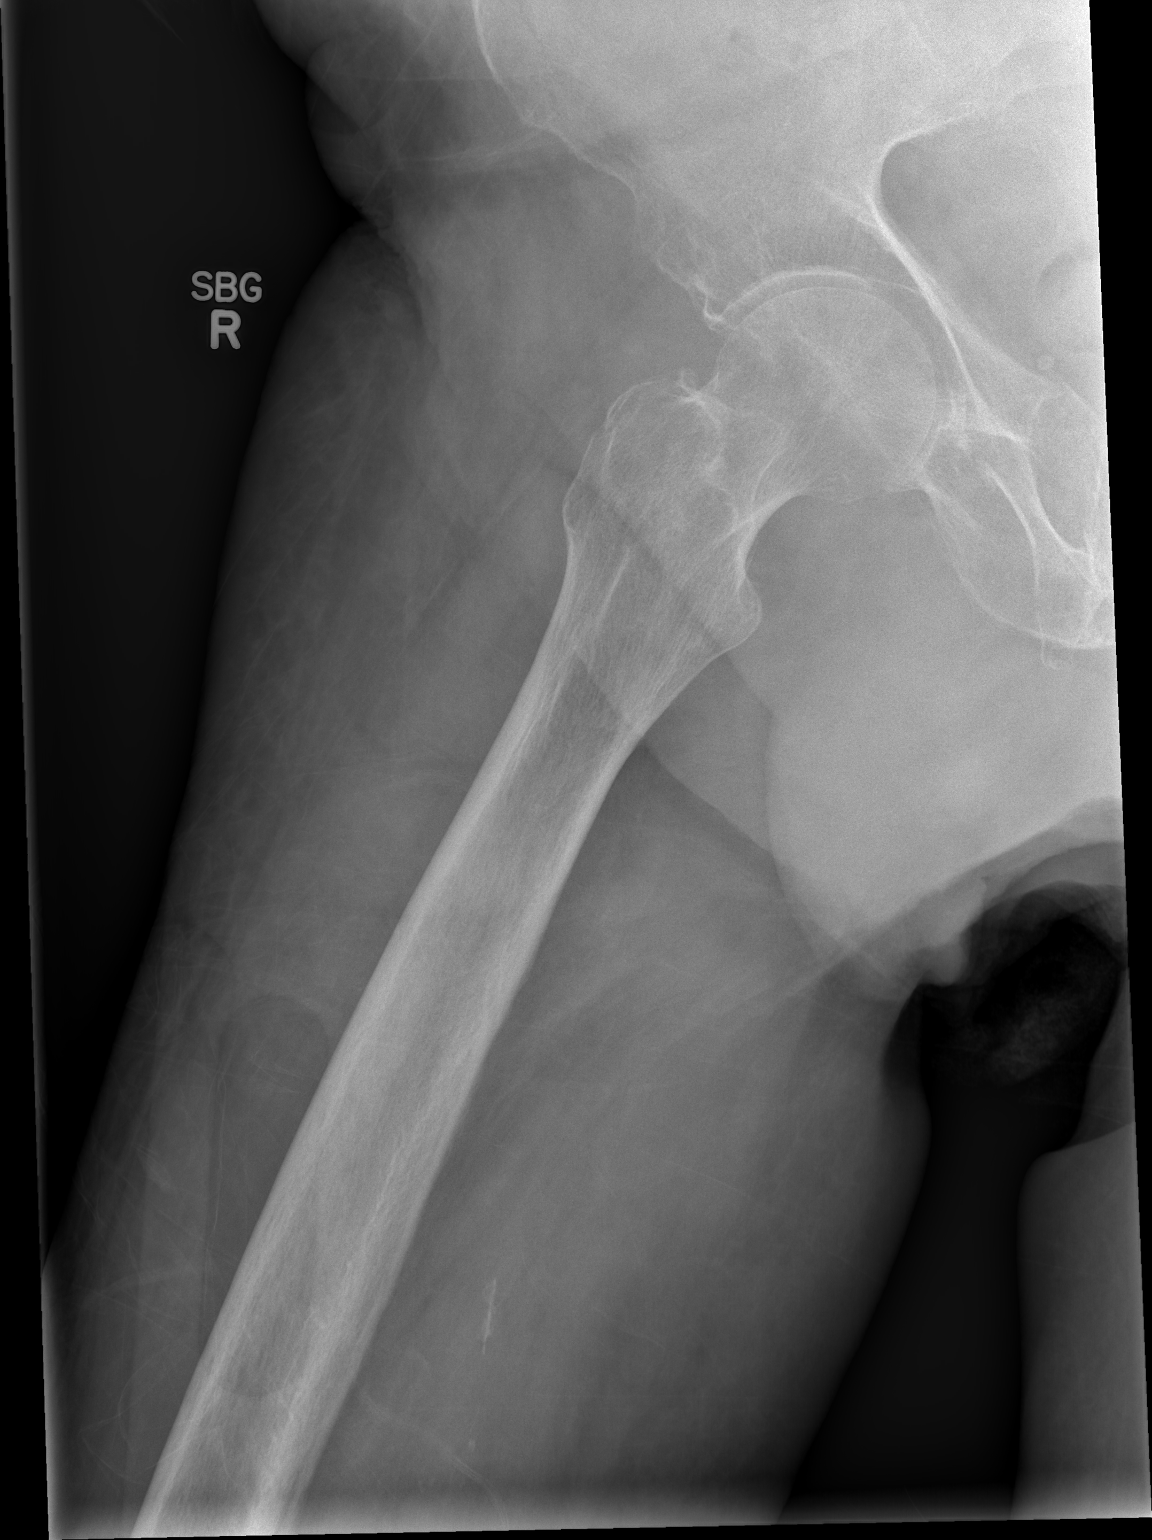

[3 of 3 positions shown; findings below may reference images not displayed]

FINDINGS: Left femur IM nail and helical hip screw noted. This traverses the
prior intertrochanteric fracture.

Acute fracture, right pubic body and adjacent portions of the
superior and inferior pubic rami. Possible fracture of the right
sacrum. Bony demineralization.

In addition, there is bony discontinuity in the right lateral
trochanter suspicious for at least a trochanteric avulsion fracture.
IMPRESSION: 1. Acute fractures of the right pubic rami and adjacent pubic body.
2. Probable fracture of the right greater trochanter, possibly
avulsion, conceivably a subtle intertrochanteric fracture.
3. Possible right sacral fracture.
4. Old left hip fracture traversed by a helical hip screw.

## 2014-11-07 IMAGING — CR DG CHEST 2V
2 series · 2 of 2 positions shown · non-contrast
Comparison: 10/15/2013

CLINICAL DATA: Status post fall

EXAM:
CHEST  2 VIEW

[w chest lat]
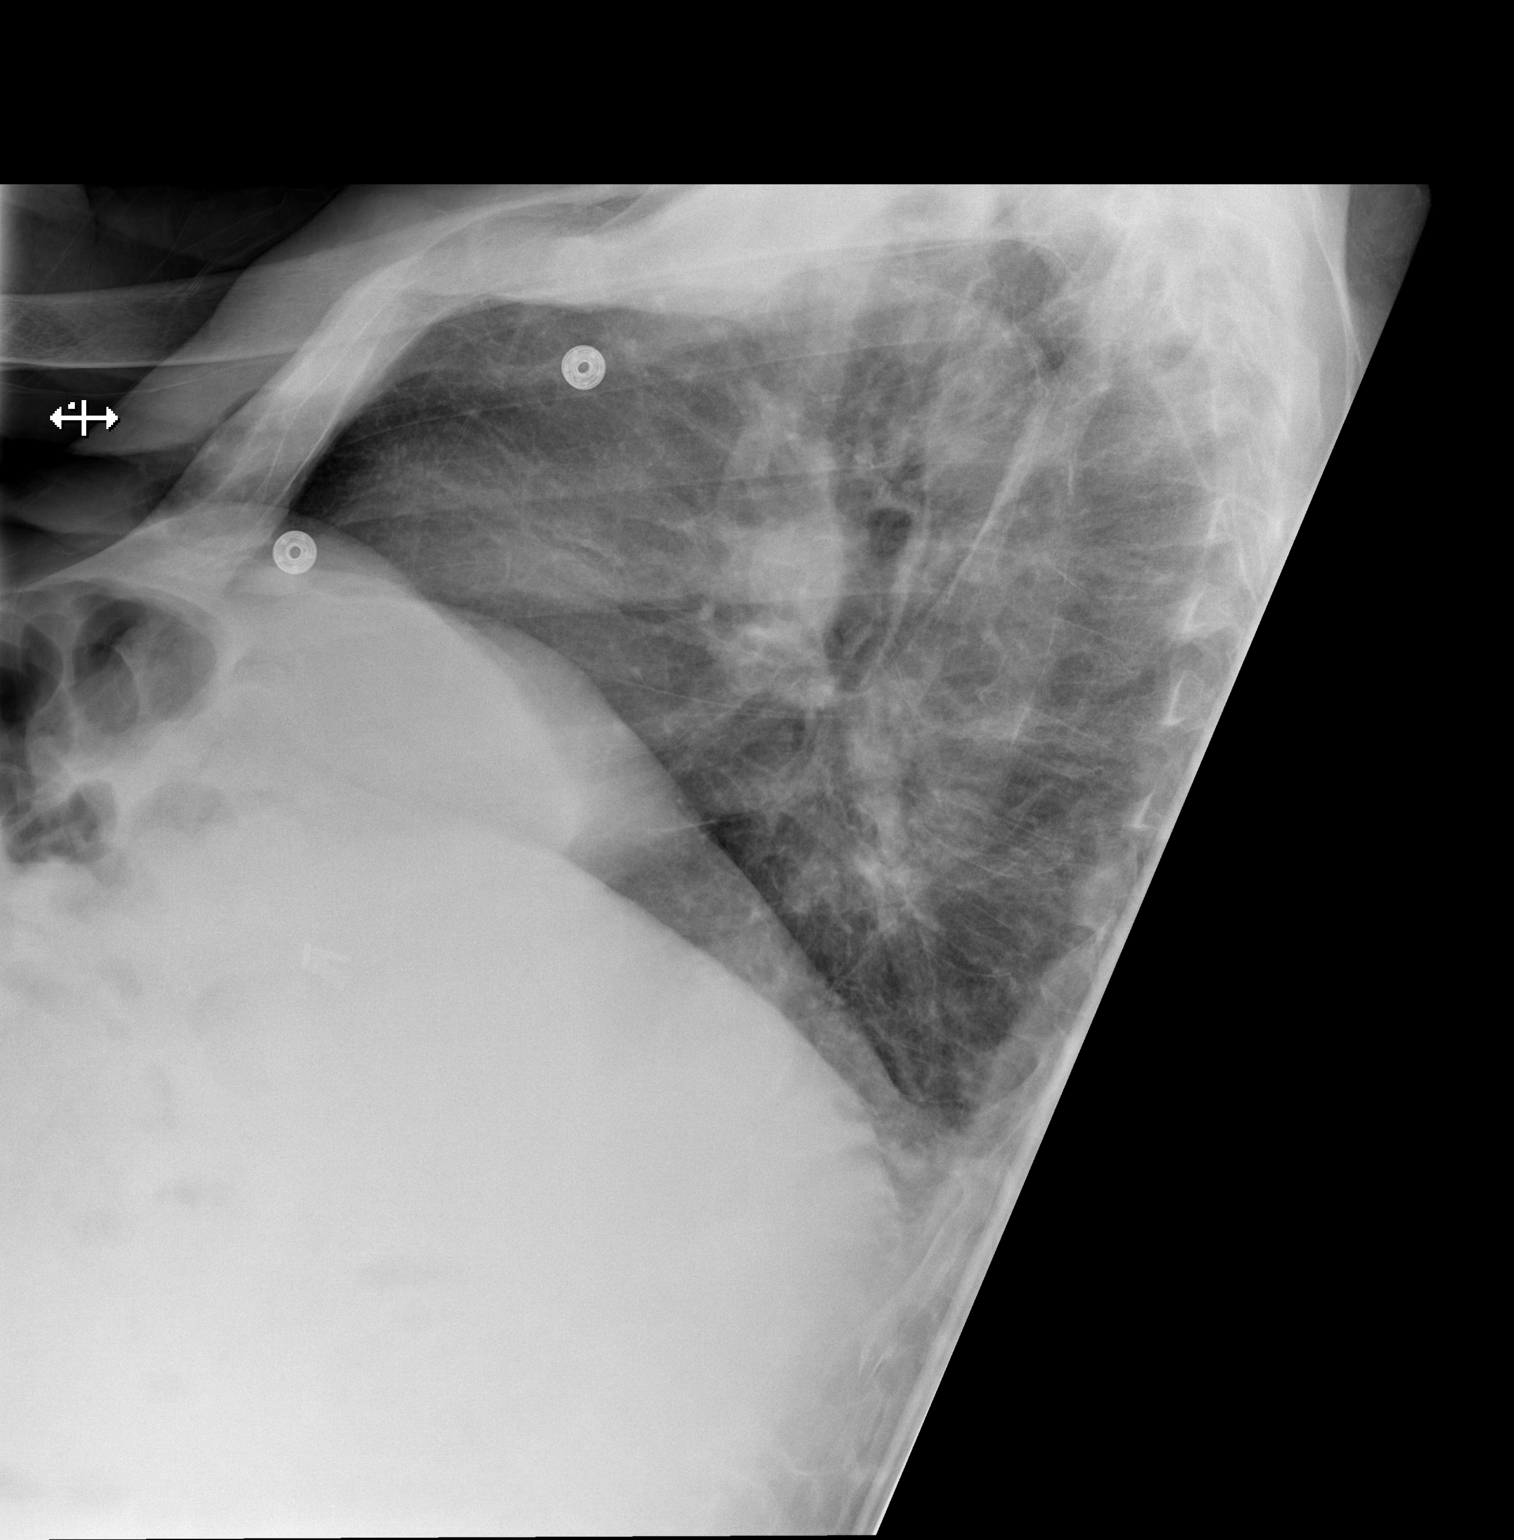

[x chest ap]
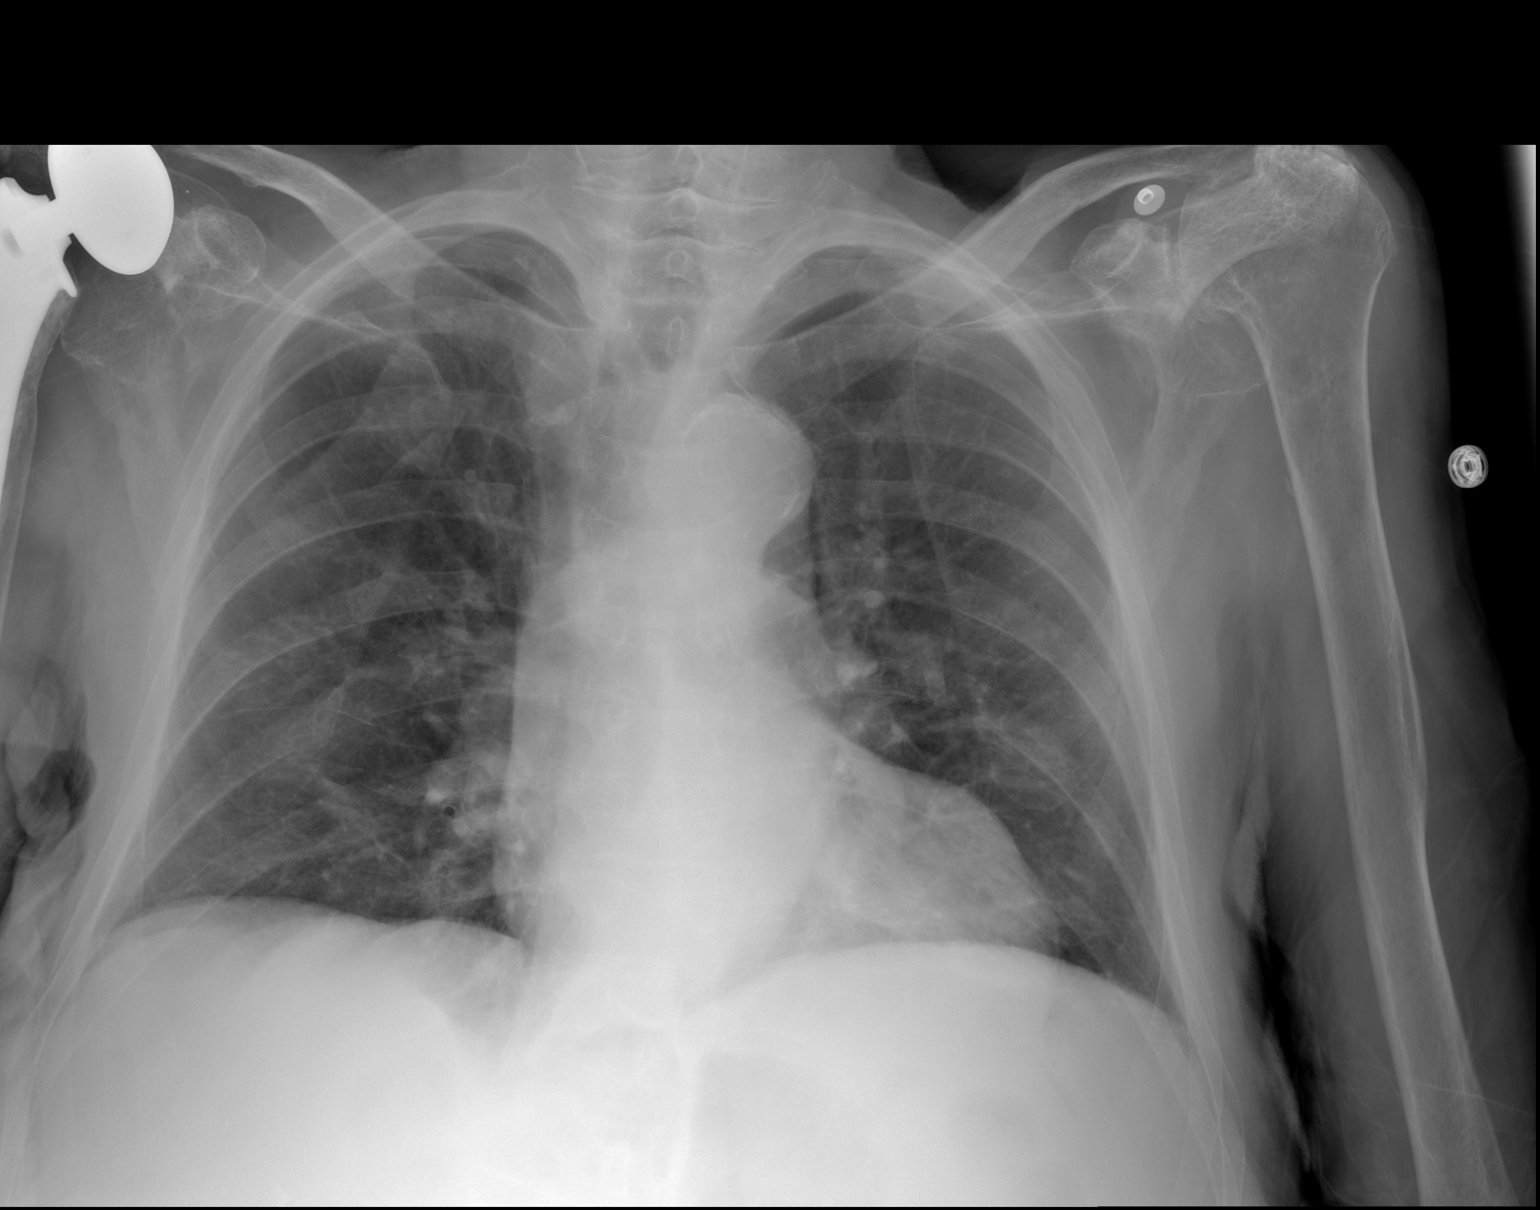

[2 of 2 positions shown; findings below may reference images not displayed]

FINDINGS: There is no focal parenchymal opacity, pleural effusion, or
pneumothorax. The heart and mediastinal contours are unremarkable.

There is a right shoulder arthroplasty. There is osteoarthritis of
the left shoulder.
IMPRESSION: No active cardiopulmonary disease.

## 2022-07-10 ENCOUNTER — Telehealth: Payer: Self-pay | Admitting: General Practice

## 2022-07-10 NOTE — Telephone Encounter (Signed)
Contacted [facility]en Sr. to schedule their annual wellness visit. Patient declined to schedule AWV at this time.  NO PCP LISTED.  [facility] [phone]
